# Patient Record
Sex: Male | Born: 1965 | Race: White | Hispanic: No | Marital: Married | Smoking: Former smoker
Health system: Southern US, Academic
[De-identification: ages and names within clinical notes are randomized; demographics above are authoritative.]

## PROBLEM LIST (undated history)

## (undated) DIAGNOSIS — J45909 Unspecified asthma, uncomplicated: Secondary | ICD-10-CM

## (undated) DIAGNOSIS — I2510 Atherosclerotic heart disease of native coronary artery without angina pectoris: Secondary | ICD-10-CM

## (undated) DIAGNOSIS — I10 Essential (primary) hypertension: Secondary | ICD-10-CM

## (undated) DIAGNOSIS — G4730 Sleep apnea, unspecified: Secondary | ICD-10-CM

## (undated) DIAGNOSIS — K219 Gastro-esophageal reflux disease without esophagitis: Secondary | ICD-10-CM

## (undated) DIAGNOSIS — I219 Acute myocardial infarction, unspecified: Secondary | ICD-10-CM

## (undated) DIAGNOSIS — S8990XA Unspecified injury of unspecified lower leg, initial encounter: Secondary | ICD-10-CM

## (undated) DIAGNOSIS — Z951 Presence of aortocoronary bypass graft: Secondary | ICD-10-CM

## (undated) DIAGNOSIS — E785 Hyperlipidemia, unspecified: Secondary | ICD-10-CM

## (undated) DIAGNOSIS — Z9989 Dependence on other enabling machines and devices: Secondary | ICD-10-CM

## (undated) DIAGNOSIS — E7800 Pure hypercholesterolemia, unspecified: Secondary | ICD-10-CM

## (undated) HISTORY — PX: HX TONSIL AND ADENOIDECTOMY: SHX28

## (undated) HISTORY — PX: HX TONSILLECTOMY: SHX27

## (undated) HISTORY — PX: HX CORONARY ARTERY BYPASS GRAFT: SHX141

## (undated) HISTORY — PX: HX DENTAL EXTRACTION: 2100001168

## (undated) HISTORY — PX: HX ADENOIDECTOMY: SHX29

## (undated) HISTORY — PX: CORONARY ARTERY ANGIOPLASTY: PR CATH30428

## (undated) HISTORY — DX: Unspecified injury of unspecified lower leg, initial encounter: S89.90XA

## (undated) HISTORY — PX: CORONARY ARTERY BYPASS GRAFT: SHX141

## (undated) HISTORY — PX: CORONARY STENT INTERVENTION: CATH118234

## (undated) HISTORY — PX: APPENDECTOMY: SHX54

## (undated) HISTORY — DX: Sleep apnea, unspecified: G47.30

---

## 2006-05-15 ENCOUNTER — Emergency Department (HOSPITAL_COMMUNITY): Payer: Self-pay

## 2007-07-12 ENCOUNTER — Other Ambulatory Visit (INDEPENDENT_AMBULATORY_CARE_PROVIDER_SITE_OTHER): Payer: Self-pay | Admitting: ORTHOPEDIC, SPORTS MEDICINE

## 2007-07-12 ENCOUNTER — Ambulatory Visit (INDEPENDENT_AMBULATORY_CARE_PROVIDER_SITE_OTHER): Payer: Worker's Compensation

## 2007-07-12 ENCOUNTER — Ambulatory Visit (INDEPENDENT_AMBULATORY_CARE_PROVIDER_SITE_OTHER): Payer: Worker's Compensation | Admitting: ORTHOPEDIC, SPORTS MEDICINE
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
## 2007-08-14 ENCOUNTER — Encounter (INDEPENDENT_AMBULATORY_CARE_PROVIDER_SITE_OTHER): Payer: Worker's Comp, Other unspecified | Admitting: ORTHOPEDIC, SPORTS MEDICINE

## 2007-08-16 NOTE — Progress Notes (Signed)
[facility] Department of Orthopaedics  [address]  [place], [place] [address]      SPORTS MEDICINE PROGRESS NOTE    PATIENT NAME: [name] [name], [name] [name]  CHART NUMBER: [id number]  DATE OF BIRTH:   DATE OF SERVICE: 08/14/2007    DATE OF INJURY: 07/02/2007  CLAIM NUMBER: [id number]    SUBJECTIVE: Mr. Gupton presents for followup of his right knee and review of his MRI. He brings a copy of his MRI today on a CD. I have personally reviewed this film. He reports that after I had seen him, he was initially doing light duties. He felt as though his knee was improving; however, he then was cleared to return to full duties, apparently by another physician, and he reports a few episodes in which he had been kneeling down on one knee and when he stood up, he had a locking sensation in his knee and was unable to straighten or bend his knee and had severe pain. This lasted for about a day and a half and then he was able to slowly work this out. He also reports a similar incident this past weekend, which occurred while he was home on Saturday. Apparently he was at the point that he could not work yesterday because of inability to straighten his knee. He notes a catching sensation come across the lateral aspect of the knee when this happens. He feels that the pain over the front of the knee has subsided somewhat. He denies any patellar instability or further patellar dislocations after the initial event.     OBJECTIVE: He is in no acute distress. He has a minimally antalgic gait. Right knee: There is a trace effusion. He is nontender over the medial border of the patella. There is no significant patellar apprehension. He maintains full flexion of the knee. He is point tender over the posterior medial joint line. McMurray maneuver isolates pain to the posterior medial joint line and on one occasion throughout the exam, there is a palpable click noted over the posterior horn of the medial meniscus. I also did a brief ultrasound examination of the medial meniscus, which did not demonstrate any subluxation of the meniscus or any obvious tears in the peripheral portion of the meniscus.     RADIOGRAPHS: MRI dated 2/17 is reviewed by myself. This demonstrates a sprain or partial tearing of the medial patellofemoral ligament. There are no obvious tears of the medial meniscus. There are no injuries noted of the cruciate ligaments. There are no significant bony injuries.    ASSESSMENT: Right patellar dislocation and unspecified internal derangement of the right knee.    PLAN: I would recommend returning to limited duties with no lifting greater than 20 pounds, no kneeling and squatting or climbing activities for the next three weeks. I also feel it is of utmost importance for him to return to physical therapy during this time to prevent reinjury and to promote further and complete healing. I would like to see him back in three weeks to see how he has done returning to some of the restrictions at work, as well as returning to physical therapy.      Beola Cord, MD  Assistant Professor  [facility] Department of Orthopaedics    [id number]; D: 08/14/2007 12:41:13; T: 08/16/2007 01:22:42    cc: [employer]    [address]    [place], [place] [address]

## 2010-07-19 ENCOUNTER — Observation Stay (HOSPITAL_COMMUNITY): Admission: EM | Admit: 2010-07-19 | Disposition: A | Discharge status: Home / Self Care | Payer: BC Managed Care – PPO

## 2010-07-19 ENCOUNTER — Emergency Department (EMERGENCY_DEPARTMENT_HOSPITAL): Payer: BC Managed Care – PPO

## 2010-09-27 ENCOUNTER — Emergency Department (EMERGENCY_DEPARTMENT_HOSPITAL): Payer: BC Managed Care – PPO

## 2010-09-27 ENCOUNTER — Emergency Department (EMERGENCY_DEPARTMENT_HOSPITAL): Admission: EM | Admit: 2010-09-27 | Discharge: 2010-09-27 | Payer: BC Managed Care – PPO

## 2011-06-15 HISTORY — PX: HX STENTING (ANY): 2100001347

## 2011-10-07 ENCOUNTER — Emergency Department (EMERGENCY_DEPARTMENT_HOSPITAL): Admission: EM | Admit: 2011-10-07 | Discharge: 2011-10-07 | Payer: Self-pay

## 2011-10-07 ENCOUNTER — Emergency Department (EMERGENCY_DEPARTMENT_HOSPITAL): Payer: Self-pay

## 2012-04-09 ENCOUNTER — Emergency Department (EMERGENCY_DEPARTMENT_HOSPITAL)
Admission: EM | Admit: 2012-04-09 | Discharge: 2012-04-09 | Payer: 59 | Attending: Emergency Medicine | Admitting: Emergency Medicine

## 2012-04-09 ENCOUNTER — Emergency Department (EMERGENCY_DEPARTMENT_HOSPITAL): Payer: 59

## 2013-01-24 ENCOUNTER — Ambulatory Visit (HOSPITAL_BASED_OUTPATIENT_CLINIC_OR_DEPARTMENT_OTHER): Payer: 59 | Admitting: Internal Medicine

## 2013-01-24 VITALS — BP 140/80 | HR 76 | Resp 18 | Ht 75.0 in | Wt 284.0 lb

## 2013-01-24 NOTE — H&P (Signed)
 [facility]  [address] [place], [place] [address]    HISTORY AND PHYSICAL    PATIENT NAME: [name] [name]  CHART NUMBER: [id number]  DATE OF BIRTH: 1965-12-26  DATE OF SERVICE: 01/24/2013    [facility] Office  [address]  [place], [place] [address]  Fax Number [phone]    PATIENT IDENTIFICATION, CHIEF COMPLAINT, HISTORY OF PRESENT ILLNESS:  [name] is a 47 year old man with history of coronary artery disease status post PCI to the LAD first being in December 2011 and last being August 2012, where he had a 3.0 x 38 mm ION stent placed in the LAD and also a 3.0 x 8 mm ION stent in the LAD.  His only other past medical history is asthma and hypertension.  He is here today for scheduled followup.  He has no return of his angina symptoms, no angina on exertion, no dyspnea and no dyspnea on exertion.  He states he has had recent stress testing, which was normal.  He has previously followed at [facility] by Dr. Dick Sharps.  He has no other complaints today.    PAST MEDICAL HISTORY:  Coronary artery disease as above, status post PCI in August 2012 with a 3.0 x 8 mm ION stent, a 3.0 x 38 mm ION stent in LAD, in December 2011, he also had another stent for a total of 3 stents in the LAD, asthma, hypertension.    PAST SURGICAL HISTORY:  Cardiac catheterization last being August 2012.    ALLERGIES:  PHENERGAN.    MEDICATIONS:  1.  Lisinopril 5 mg daily.  2.  Plavix  75 mg daily.  3.  Metoprolol  12.5 mg b.i.d.  4.  Lipitor 20 mg daily.  5.  Aspirin  325 mg daily.  6.  Nitroglycerin  p.r.n.    SOCIAL HISTORY:  Nonsmoker, nondrinker.    FAMILY HISTORY:  Positive for premature family history of coronary artery disease in the mother and father.    REVIEW OF SYSTEMS:  As per history of present illness.  All other review of systems is negative.  Cardiac review of systems negative for chest pain.    PHYSICAL EXAMINATION:  Vital signs:  Blood pressure is 140/80, pulse 76, blood pressure  140/80, respirations 18, weight is 284 pounds, height 6 feet 3 inches.  General:  No acute distress, pleasant man.  Head and neck:  No jugular venous distention, normocephalic, atraumatic.  Extraocular muscles intact.  Cardiac:  Regular rate and rhythm, normal S1 and S2 with no significant murmurs.  Lungs:  Clear to auscultation bilaterally, no wheezes, rales or rhonchi.  Abdomen:  Soft, nontender, normal abdominal bowel sounds.  Extremities:  Revealed no edema bilateral lower extremities.  Neurologic:  Cranial nerves 2-12 grossly intact.  No focal deficits.    LABS AND TESTS:  July 2014, TSH 1.23, total cholesterol 133, triglycerides 139, HDL 37, LDL 68, potassium 4.8, BUN 17, creatinine 1.0, AST 22, ALT 31, hemoglobin 16.98, hematocrit 49, platelet count 259.    ASSESSMENT AND PLAN:  1.  Coronary artery disease status post PCI to the LAD last being August 2012.  2.  Asymptomatic from a cardiac standpoint.  3.  Hypertension.    RECOMMENDATIONS:  1.  At this time, the patient is stable.  He is having no symptoms.  His lipids are at goal.  He is to continue medical therapy as listed above.  2.  The patient to take aspirin  81  mg daily.  He does not need the full dose of aspirin .  He will follow up me in 6 months.  He is stable from a cardiac standpoint, no further testing as he has had recent lipids, his lipids were at goal, and labs are satisfactory.      Curtistine Bos, MD  Assistant Professor  [facility] Department of Cardiology    [id number]; D: 01/24/2013 12:36:01; T: 01/24/2013 86:71:72    cc: Cathryne Neas PA-C

## 2013-01-25 ENCOUNTER — Encounter (HOSPITAL_BASED_OUTPATIENT_CLINIC_OR_DEPARTMENT_OTHER): Payer: Self-pay | Admitting: Internal Medicine

## 2013-03-07 ENCOUNTER — Ambulatory Visit (INDEPENDENT_AMBULATORY_CARE_PROVIDER_SITE_OTHER): Payer: 59 | Admitting: Orthopaedic Surgery

## 2013-03-07 ENCOUNTER — Encounter (INDEPENDENT_AMBULATORY_CARE_PROVIDER_SITE_OTHER): Payer: Self-pay | Admitting: Orthopaedic Surgery

## 2013-03-07 VITALS — BP 126/72 | Ht 75.0 in | Wt 284.0 lb

## 2013-03-08 NOTE — H&P (Addendum)
[facility]opaed[facility]  [address], [address]  [place], [place] [address]        OFFICE VISIT    PATIENT NAME:      [name] [name], [name] [name]  VISIT IDENTIFICATION    [id number]  MEDICAL RECORD NUMBER [id number]    DICTATING PHYSICIAN: Bertram Millard. [place], [place]   REFERRING PHYSICIAN:     Randol Kern, PA-C    DOB:     DOS: 03/07/2013    cc:  Randol Kern, PA-C    HISTORY OF PRESENT ILLNESS:  Fadil is a very pleasant 47 year old male that presents to the office today  with complaints of left knee pain. The patient states that on February 24, 2013, he was doing work on a [employer] stating that he was sitting on  his legs while they were bent and he was kneeling. He states that as he began  to rise from this position, he twisted his left knee, felt a pop, that he  describes as a tearing of a band within the lateral aspect of his left knee.  He states that he began to immediately experience left knee pain. He was  unable to completely extend his left knee. He states that he had to have his  son pull on his leg in order to straighten this. He states that it then began  to buckle when he began to place weight on it, and it began to swell severely.  He states that he began to Ace wrap this and ice it. He presented to  [facility] where x-rays were performed and did not reveal any sign of acute  fractures, osseous lesions or bony abnormalities. He was advised to compress  and ice, and take Tylenol as needed for his pain. He states that he is unable  to take nonsteroidal anti-inflammatory drugs (NSAIDs) secondary to his  coronary artery stents and blood thinners. He states that he has been  experiencing constant left knee pain since this time that he describes as dull  and burning primarily in the lateral aspect of the left knee, worse with  movement, use and resting for too long; better with elevation, icing and  bracing. He rates the pain today as an 8 out of 10. He does admit to catching   and locking. He denies any clicking in the left knee. He admits to buckling  and instability but denies any patellar dislocation. He, otherwise, today  denies any pins and needle sensations, paralysis, paresthesias, numbness,  change in color or pallor of the left lower extremity. He denies any fevers,  chills, night sweats, nausea, vomiting, abdominal pain, chest pain, shortness  of breath or dyspnea.     REVIEW OF SYSTEMS:  Review of systems as per history of present illness (HPI); otherwise, review  of systems is negative.     PAST MEDICAL HISTORY:   Significant for coronary artery disease.     PAST SURGICAL HISTORY:  Significant for coronary artery stenting, tonsillectomy, adenoidectomy, and  dental extraction.     FAMILY HISTORY:  None.     SOCIAL HISTORY:  The patient denies any tobacco, alcohol or intravenous (IV) drug abuse.     MEDICATIONS:  ProAir inhaled p.o. p.r.n. shortness of breath, Tylenol 500 milligrams p.o.  p.r.n. pain, aspirin 325 milligrams p.o. daily, Lipitor 20 milligrams p.o.  daily, Plavix 75 milligrams p.o. daily, Prinivil 5 milligrams daily, Lopressor  25 milligrams p.o. daily, and Nitrostat 0.4 milligrams sublingual p.o. p.r.n.  chest pain.  ALLERGIES:  PHENERGAN.     PHYSICAL EXAMINATION:  GENERAL:  Reveals a very pleasant 47 year old male in no apparent distress.  Awake, alert, and oriented x3. Well-nourished, well-developed, and appearing  his stated age.   VITAL SIGNS:  Blood pressure: 126/72. WEIGHT: 284 pounds. HEIGHT:  6 feet 3 inches. Body mass index (BMI) is 35.50.   HEENT:  Normal. Extraocular movements are intact.   LUNGS:  Respirations are clear. Symmetrical rise and fall of the chest wall  with inspiration and expiration.   ABDOMEN:  Soft, nondistended.   MUSCULOSKELETAL:  Examination today of the left knee reveals minimal left knee  joint effusion. No significant left-sided quadriceps atrophy. Calf is soft and   supple without pain to palpation. Extensor hallucis longus (EHL), flexor  hallucis longus (FHL) and Achilles tendons are intact. He does have full  extension. Range of motion is 0 to 115 degrees extension. Strength is rated as  4/5 on the left. Flexion is rated as 4+/5. Varus and valgus stresses are  stable. Varus stress does reproduce pain. There is pain to palpation over the  lateral collateral ligament (LCL). There is lateral joint line tenderness. No  medial joint line tenderness. Patellar tendon and quadriceps tendons are  intact. Lachman's, anterior drawer and posterior drawer are stable. McMurray's  today is positive in the lateral compartment.   NEUROLOGICAL:  Cranial nerves II through XII are grossly intact. No sign of  any focal motor deficits. He remains sensate to light touch in all distal  nerve distributions of the left lower extremity.   CARDIOVASCULAR:  Dorsalis pedis pulse is palpated as 2+ today on the left. No  significant left-sided lower extremity edema. Brisk capillary refill in all  distal phalanges of the left lower extremity.   INTEGUMENTARY:  Intact. Reveals no sign of any ecchymosis, abrasions  excoriations, jaundice, rashes, or other visible lesions.   PSYCHIATRIC:  Mood and affect are appropriate.     IMAGING DATA:  4 views of the left knee today reveals no sign of acute fractures, osseous  lesions, bony abnormalities. No significant degenerative joint disease is  appreciated.     ASSESSMENT:  1. Lateral meniscus tear of the left knee.   2. Lateral collateral ligament (LCL) strain.     PLAN:  My plan for Mr. Lejeune at this time will be to order an magnetic resonance  imaging (MRI) scan of his left knee to further rule out lateral meniscus tear.  He will continue to wear his brace, ice, and take over the counter drugs  (NSAIDs) such as Tylenol for his pain. He will continue to wear his brace. He  would like to return back to work tonight, full duty. 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 I feel this is acceptable since he does drive and it is  an automatic. I will see him back once his magnetic resonance imaging (MRI)  scan has been completed, sooner if he develops any further pains, problems or  concerns. After discussing this plan with the patient today, he is very  understanding and agreeable. All of his questions were answered to his  satisfaction today in the office.                                   Bertram Millard. Opal Sidles, PA-C  Duard Larsen, MD      d:  03/07/2013 13:12:13  t:  03/08/2013 09:04:43  ag  doc#: [id number]  voice#:  [id number]  <START FOOTER> Page 2 of 3  <end footer>

## 2013-04-16 ENCOUNTER — Encounter (INDEPENDENT_AMBULATORY_CARE_PROVIDER_SITE_OTHER): Payer: Self-pay | Admitting: Orthopaedic Surgery

## 2013-04-19 ENCOUNTER — Ambulatory Visit (INDEPENDENT_AMBULATORY_CARE_PROVIDER_SITE_OTHER): Payer: 59 | Admitting: Orthopaedic Surgery

## 2013-04-19 ENCOUNTER — Encounter (INDEPENDENT_AMBULATORY_CARE_PROVIDER_SITE_OTHER): Payer: Self-pay | Admitting: Orthopaedic Surgery

## 2013-04-19 VITALS — BP 114/76 | Ht 75.0 in | Wt 284.0 lb

## 2013-04-20 ENCOUNTER — Encounter (INDEPENDENT_AMBULATORY_CARE_PROVIDER_SITE_OTHER): Payer: Self-pay | Admitting: Orthopaedic Surgery

## 2013-04-23 NOTE — Progress Notes (Addendum)
[facility]  [address]  [place], [place] [address]        FOLLOW-UP VISIT    PATIENT NAME:        [name] [name], [name] [name]  VISIT IDENTIFICATION    [id number]  MEDICAL RECORD NUMBER [id number]    DICTATING PHYSICIAN: Bertram Millard. [place], [place]   REFERRING PHYSICIAN:     Randol Kern, PA-C    DOB:     DOS: 04/19/2013    cc:  Randol Kern, PA-C    HISTORY OF PRESENT ILLNESS:  [name] [name] is a very pleasant 47 year old male who presents to the office today  for follow-up of a left knee magnetic resonance imaging (MRI) scan. During the  patient's last office visit, he was diagnosed with a left knee lateral  meniscal tear. Since the time that I last saw him in September, he states that  his pain has been relatively the same. He states that he is still experiencing  catching, clicking and locking within the left knee. He also admits to  instability and buckling of his left knee with ambulating up and down stairs.  He admits to pain with deep squats, as well as twisting of the left knee. He  admits to intermittent effusions. He otherwise today denies any further pins  and needles sensations, paresthesias, numbness, change in color or pallor of  the left lower extremity. He denies any other neurological, cardiovascular or  further orthopedic pains, problems or concerns.     REVIEW OF SYSTEMS:  Review of systems as per history of present illness (HPI); otherwise, review  of systems are negative.     PAST MEDICAL HISTORY, PAST SURGICAL HISTORY, FAMILY HISTORY, SOCIAL HISTORY,  MEDICATIONS AND ALLERGIES:   Were all reviewed with the patient today and remain unchanged from the  patient's prior office visit on February 25, 2013.           PHYSICAL EXAMINATION:  GENERAL:  A very pleasant 47 year old male in no apparent distress. Awake,  alert, and oriented x3. He is well-nourished, well-developed, and appearing  his stated age.   VITAL SIGNS:  Blood pressure: 114/76. Weight: 284 pounds. Height: 6 feet 3   inches. Body mass index (BMI): 35.5.    MUSCULOSKELETAL:  Examination today of the left knee reveals no significant  left knee quadriceps atrophy. No significant left knee joint effusion. The  calf is soft and subtle without any pain to palpation. Extensor hallucis  longus (EHL), flexor hallucis longus (FHL) and Achilles tendons are all  intact. Varus and valgus stresses are stable. Patellar tendon and quadriceps  tendons are intact. Extensor mechanism is intact. No medial or joint line  tenderness. There is lateral joint line tenderness. Range of motion is 0 to  120 degrees. Strength in terms of knee flexion and extension today were rated  as 4+ out of 5 today on the left in comparison to the right. Lachman's,  anterior drawer, posterior drawer are stable. McMurray's today is positive in  the lateral compartment.   NEUROLOGICAL:  Cranial nerves 2 through 12 are grossly intact. No sign of any  focal motor deficits. He remains sensate to light touch in all distal nerve  distributions of the left lower extremity.   CARDIOVASCULAR:  Dorsalis pedis pulse is palpated as 2+ today on the left. No  significant left-sided lower extremity edema is appreciated. Brisk capillary  refill in all distal phalanges of the left lower extremity.   INTEGUMENTARY:  Intact; reveals no sign  of any jaundice, rashes, or other  visible lesions.     IMAGING DATA:  MRI scan of the left knee was reviewed that today the patient, which reveals  in the anterior and posterior horn lateral meniscal tears of the left knee, as  well as an anterior cruciate ligament (ACL) strain. Otherwise, there is  appreciated knee joint effusion. No sign of any osseous lesions or further  bony abnormalities. No sign of acute fractures.     ASSESSMENT:  1. Left knee lateral meniscal tear.   2. Anterior cruciate ligament (ACL) strain.     PLAN:  My plan for [name] [name] at this time will be to have him evaluated by Dr.   Shirley Friar to discuss the possibility of arthroscopic debridement of his lateral  meniscus. The patient has opted to have this fixed if he is a surgical  candidate. He will otherwise continue over-the-counter nonsteroidal  anti-inflammatory drug (NSAID) therapy, heat, ice and elevation, compression,  and rest. I will see him back on an as needed basis. After discussing this  plan with the patient today, he was understanding and agreeable. All of his  questions were answered to his satisfaction today in the office.                                        Bertram Millard. Opal Sidles, [place]                                Duard Larsen, MD    d:  04/20/2013 15:57:51  t:  04/23/2013 14:36:14  cw  doc#:   [id number]  voice#:  [id number]  <START FOOTER> Page 2 of 3  <end footer>

## 2013-04-30 ENCOUNTER — Ambulatory Visit (INDEPENDENT_AMBULATORY_CARE_PROVIDER_SITE_OTHER): Payer: 59 | Admitting: Sports Medicine

## 2013-04-30 ENCOUNTER — Encounter (INDEPENDENT_AMBULATORY_CARE_PROVIDER_SITE_OTHER): Payer: Self-pay | Admitting: Sports Medicine

## 2013-04-30 VITALS — BP 106/74 | Ht 75.0 in | Wt 275.0 lb

## 2013-04-30 LAB — BASIC METABOLIC PANEL
BUN: 17 mg/dL (ref 8–20)
CALCIUM: 9.3 mg/dL (ref 8.9–10.3)
CARBON DIOXIDE: 29 mEq/L (ref 22–32)
CHLORIDE: 101 mEq/L (ref 101–111)
CREATININE: 1.1 mg/dL (ref 0.6–1.2)
ESTIMATED GLOMERULAR FILTRATION RATE: >60 — AB
GLUCOSE,NONFAST: 108 mg/dL (ref 70–110)
POTASSIUM: 4.4 mEq/L (ref 3.6–5.1)
SODIUM: 136 mEq/L (ref 136–144)

## 2013-05-09 NOTE — Progress Notes (Signed)
 [facility]  [address]  [address]  [place], [place] [address]  [phone]      FOLLOW-UP VISIT    PATIENT NAME:        [name] [name], [name] [name] IDENTIFICATION    [id number]  MEDICAL RECORD NUMBER [id number]    DICTATING PHYSICIAN: Fairy Goo, MD   REFERRING PHYSICIAN:     Elenor Neas, PA-C    DOB:   [birth year]  DOS: 04/30/2013    cc:  Elenor Neas, PA-C      CHIEF COMPLAINT:  Left knee pain.     HISTORY OF PRESENT ILLNESS:  [name] [name] is a pleasant 47 year old male who had a twisting injury to his left knee  while fixing his lawn mower. He presented to my physician assistant (PA) with  lateral left knee pain. Magnetic resonance imaging (MRI) was performed. It  showed a lateral meniscus tear. He was sent to me for definitive care. He  isolated pain in the lateral aspect of the knee, a lot of popping, catching  and clicking and on and off effusions. He denies any significant instability.  He never had issues with this knee prior.     Past medical history, surgical history, medications, allergies, social  history, family history, and review of systems were discussed and reviewed  with the patient, unchanged from the visit on March 07, 2013.     PHYSICAL EXAMINATION:  VITAL SIGNS:  Blood pressures: 106/74. Height: 275 pounds. Height: 6 feet 3  inches. Body mass index (BMI): 34.37.   GENERAL:  He is alert, oriented answers questions appropriately. He is  pleasant. He walks with a normal gait and has a normal affect.   HEENT:  Normal.   LUNGS:  Breathing is clear and unlabored.   ABDOMEN:  Soft.   EXTREMITIES:  Physical examination the left knee reveals range of motion just  shy of full extension. Comfortably can flex to 135 degrees. Pain with  hyperflexion and hyperextension. Lateral joint line tenderness to palpation,  lateral pain with McMurray's. No medial pain. Anterior cruciate ligament  (ACL), posterior cruciate ligament (PCL), lateral collateral ligament (LCL)  are intact. Quadriceps (quad) has good tone.  Extensor mechanism is strong and  intact. No patellofemoral signs. Calf is soft and supple. Skin is normal.  Normal motor, sensory and vascular exam distally. Passive range of motion does  not reproduce pain.     IMAGING:   X-rays of the left knee shows no fracture, dislocation, or degenerative  change. MRI was reviewed. It shows is a complex lateral meniscus tear.     IMPRESSION:  Lateral meniscus tear of the left knee.     PLAN:  Left knee arthroscopy and partial lateral meniscectomy. Risk, benefits and  details of that were explained to the patient. He agreed to proceed and gave  informed written consent.                                  Fairy Goo, MD                                        d:  05/07/2013 16:25:48  t:  05/09/2013 13:47:36  cw  doc#:  [id number]  voice#:  [id number]  <START FOOTER> Page 2 of 2  <end footer>

## 2013-06-06 ENCOUNTER — Encounter (INDEPENDENT_AMBULATORY_CARE_PROVIDER_SITE_OTHER): Payer: Self-pay | Admitting: Sports Medicine

## 2013-06-06 ENCOUNTER — Ambulatory Visit (INDEPENDENT_AMBULATORY_CARE_PROVIDER_SITE_OTHER): Payer: 59 | Admitting: Sports Medicine

## 2013-06-06 VITALS — BP 110/70 | Wt 275.0 lb

## 2013-06-10 NOTE — Progress Notes (Signed)
[facility]  [address]  [address]  [place], [place] [address]  [phone]      FOLLOW-UP VISIT    PATIENT NAME:        [name] [name], [name] [name]  VISIT IDENTIFICATION  [id number]  MEDICAL RECORD NUMBER [id number]    DICTATING PHYSICIAN: Duard Larsen, MD   REFERRING PHYSICIAN:     Randol Kern, PA-C    DOB:     DOS: 06/06/2013    cc:  Randol Kern, PA-C    Jay is 2 weeks status post left knee arthroscopy and partial lateral  meniscectomy. He has done fantastic. He has progressed nicely with physical  therapy. He says the knee feels significantly better. He is walking with a  cane but otherwise denies any fever, chills, or sweats. Denies wound  complications. No calf pain or swelling.    Past medical history, past surgical history, medications, allergies, social  history, family history, and review of systems were discussed and reviewed  with the patient and unchanged from the visit on January 24, 2013.     PHYSICAL EXAMINATION:  VITAL SIGNS:  Blood pressure 110/70.  Weight:  275 pounds.  Body Mass Index  (BMI) is 34.37.   GENERAL:  He is alert, oriented answers questions appropriately. He is  pleasant. Walks with a slight limp with a cane. He has a normal affect.   HEENT:  Normal.   LUNGS:  Breathing is clear and unlabored.   ABDOMEN:  Soft.   EXTREMITIES:  Physical examination of the left knee. Stitches were removed  today. Portals look great and appropriate, mild to moderate effusion, not warm  or red. His range of motion is just shy of full extension. Comfortably can  flex to 125 degrees today. Normal motor, sensory and vascular exam distally.         IMPRESSION:  Progressing nicely 2 weeks status post left knee arthroscopy, partial lateral  meniscectomy.     PLAN:  At this point is to continue working on getting last bit of range of motion  and last bit of strength and effusion control and I would like to see him back  in 4 weeks. He is to continue with physical therapy.        Duard Larsen, MD                                        d:  06/06/2013 17:13:15  t:  06/10/2013 14:55:26  jr  doc#:  [id number]  voice#:  [id number]  <START FOOTER> Page 2 of 2  <end footer>

## 2013-06-27 ENCOUNTER — Other Ambulatory Visit (HOSPITAL_BASED_OUTPATIENT_CLINIC_OR_DEPARTMENT_OTHER): Payer: Self-pay | Admitting: Internal Medicine

## 2013-06-27 DIAGNOSIS — Z024 Encounter for examination for driving license: Secondary | ICD-10-CM

## 2013-06-27 DIAGNOSIS — I2510 Atherosclerotic heart disease of native coronary artery without angina pectoris: Secondary | ICD-10-CM

## 2013-06-27 NOTE — Progress Notes (Signed)
Horton Finerodd A Resendes MRN: [id number] Home: [phone]    PCP: Donny PiqueLeach, Carrie, PA-C Work: [phone]   48 y.o. / Male () Wt: 124.739 kg (275 lb) (06/06/2013) Mobile: [phone]               Documentation      Summary: stress test      >> Awilda BillKARA L DELBERT, CS 06/25/2013 09:31 AM  Patient of Dr. 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 Please call Bjorn LoserRhonda back at - [phone]. Tawanna Coolerodd is returning to work 1/12. Thank you.                Call History         Type Contact Phone User    06/25/2013 9:27 AM Phone (Incoming) Prusinski,RHONDA (Emergency Contact) [phone] Rexene Edison(H) Awilda Billelbert, Kara L, CS        From: Florestine AversWolf, Mindy Behnken S, RN Sent: 06/25/2013 9:46 AM To: Jodi GeraldsAnthony Roda-Renzelli, MD Subject: Annell GreeningFW: stress test Ethelene BrownsAnthony, If it is all right with you, I will order this for this pt. Please advise. Thanks, Clydie BraunKaren     RE: stress test      Jodi Geraldsoda-Renzelli, Anthony, MD            Sent: Thu June 27, 2013 8:50 AM     To: Florestine AversWolf, Kasi Lasky S, RN                              Message       Okay to order               Thank you          Ethelene BrownsAnthony

## 2013-07-01 ENCOUNTER — Ambulatory Visit (INDEPENDENT_AMBULATORY_CARE_PROVIDER_SITE_OTHER): Payer: 59 | Admitting: Orthopaedic Surgery

## 2013-07-01 ENCOUNTER — Encounter (INDEPENDENT_AMBULATORY_CARE_PROVIDER_SITE_OTHER): Payer: Self-pay | Admitting: Orthopaedic Surgery

## 2013-07-01 ENCOUNTER — Encounter (HOSPITAL_BASED_OUTPATIENT_CLINIC_OR_DEPARTMENT_OTHER): Payer: Self-pay

## 2013-07-01 VITALS — BP 124/82 | Ht 75.0 in | Wt 275.0 lb

## 2013-07-01 DIAGNOSIS — S83289A Other tear of lateral meniscus, current injury, unspecified knee, initial encounter: Principal | ICD-10-CM

## 2013-07-01 NOTE — Progress Notes (Addendum)
[facility]  [address]  [place], [place] [address]        FOLLOW-UP VISIT    PATIENT NAME:        [name] [name], [name] [name]  VISIT IDENTIFICATION  [id number]  MEDICAL RECORD NUMBER [id number]    DICTATING PHYSICIAN: Bertram Millard. [place], [place]   REFERRING PHYSICIAN:     Randol Kern, PA-C    DOB:     DOS: 07/01/2013    cc:  Randol Kern, PA-C      HISTORY OF PRESENT ILLNESS:  Mr. Ozburn is a very pleasant 48 year old male that presented to the office  today approximately 4 weeks status post left knee arthroscopy with partial  lateral meniscectomy. Over the past 4 weeks, the patient has been progressing  very nicely. He states that he has no longer been experiencing any significant  pain or discomfort within the left knee. His incisions have remained well  healed. He is no longer taking narcotic pain medication for discomfort. He  actually states today that he is not taking any medication. He has completed 4  weeks of complete physical therapy. He states that his knee feels as it did  prior to his injury. He denies any further trauma or injury. He is walking  without any form of assistance. He states today that he is ready to return  back to work full duty without any form of restrictions. Otherwise today, he  denies any catching, clicking or locking within the left knee. He denies any  buckling or instability. He does admit to intermittent popping, but otherwise  today denies any fevers, chills, night sweats, erythema, redness, discharge  drainage from the incision sites. He denies any calf pain or further edema.     REVIEW OF SYSTEMS:  Review of systems as per history of present illness (HPI); otherwise, review  of systems are negative.     Past medical history, past surgical history, family history, social history,  medications and allergies were all reviewed with the patient today and remain  unchanged from the patient's prior office visit on March 07, 2013.     PHYSICAL EXAMINATION:  GENERAL:  A  very pleasant 48 year old male in no apparent distress. Awake,  alert, and oriented x3, well-nourished, well-developed, and appearing his  stated age.   VITAL SIGNS:  Blood pressure: 124/82. Weight: 275 pounds. Height: 6 feet 3  inches. Body mass index (BMI) is 35.37.   MUSCULOSKELETAL:  Examination today of the left knee reveals no significant  left-sided quadriceps atrophy. No significant left knee joint effusion. The  calf is soft and supple without pain to palpation. Extensor hallucis longus  (EHL), flexor hallucis longus (FHL) and Achilles tendons are intact. Varus and  valgus stresses are stable. Patellar tendon and quadriceps tendons are intact.  No medial or lateral joint line tenderness. Minimal edema surrounding the  incision sites. Incisions appear well-healed. No sign of erythema, redness,  discharge or drainage. Range of motion is 0 to 130 degrees. Strength in terms  of knee flexion and extension today was rated as 5 out of 5.   NEUROLOGICAL:  Cranial nerves 2 through 12 are grossly intact. No sign of any  focal motor deficits. He remains sensate to light touch in all distal nerve  distributions of the left lower extremity.   CARDIOVASCULAR:  Dorsalis pedis pulse is 2+. Brisk capillary refill. No  significant left-sided lower extremity edema is appreciated.   INTEGUMENTARY:  Intact. Reveals 2 well-healed inferior patellar  portal  incisions without signs of erythema, redness, discharge or drainage.     ASSESSMENT:  A 48 year old male 4 weeks status post left knee arthroscopy with partial  lateral meniscectomy.     PLAN:  My plan for Mr. Cellucci at this time will be for him to discontinue physical  therapy. I will return him back to work full duty without any form of  restrictions. He will follow up with me in 4 weeks for repeat clinical and  radiographic examination to see how he has been doing with his work status. I  did advise him that he may continue to rest, ice, compress, elevate and  take  over-the-counter nonsteroidal anti-inflammatory drugs (NSAIDs) as needed. I  will see him back in 4 weeks, sooner if he develops any further pains,  problems or concerns. After discussing this plan with the patient today, he  was very understanding and agreeable. All of his questions were answered to  his satisfaction.                                         Bertram MillardStephen M. Opal SidlesSweeney, [place]-C                                Duard LarsenJoseph Fazalare, MD        d:  07/01/2013 09:43:18  t:  07/01/2013 13:25:16  cw  doc#:   [id number]  voice#:  [id number]  <START FOOTER> Page 2 of 2  <end footer>

## 2013-07-04 ENCOUNTER — Encounter (INDEPENDENT_AMBULATORY_CARE_PROVIDER_SITE_OTHER): Payer: Self-pay | Admitting: Sports Medicine

## 2013-08-01 ENCOUNTER — Encounter (INDEPENDENT_AMBULATORY_CARE_PROVIDER_SITE_OTHER): Payer: Self-pay | Admitting: Orthopaedic Surgery

## 2013-08-08 ENCOUNTER — Encounter (HOSPITAL_BASED_OUTPATIENT_CLINIC_OR_DEPARTMENT_OTHER): Payer: 59 | Admitting: Internal Medicine

## 2013-10-03 ENCOUNTER — Ambulatory Visit (HOSPITAL_BASED_OUTPATIENT_CLINIC_OR_DEPARTMENT_OTHER): Payer: 59 | Admitting: Internal Medicine

## 2013-10-03 VITALS — BP 130/84 | HR 80 | Resp 20 | Ht 75.0 in | Wt 270.0 lb

## 2013-10-03 DIAGNOSIS — I10 Essential (primary) hypertension: Secondary | ICD-10-CM

## 2013-10-03 DIAGNOSIS — I2510 Atherosclerotic heart disease of native coronary artery without angina pectoris: Secondary | ICD-10-CM

## 2013-10-03 NOTE — Progress Notes (Signed)
[facility]  [address]  [place], [place] [id number]    PROGRESS NOTE    PATIENT NAME: [name] [name], [name]  CHART NUMBER: [id number]  DATE OF BIRTH:   DATE OF SERVICE: 10/03/2013    [facility] [place] [facility]  [address]  [place], [place] [address]  Fax Number [phone]    SUBJECTIVE:  [name] is a 48 year old man with a history of coronary artery disease status post PCI to the LAD in the past, last being August 2012, sounds like he has 3 overlapping stents in his left anterior descending, placed by [facility]. Truman [facility]wardichard Smith [facility].  He is having return of anginal equivalent, left arm pain, left shoulder pain, although he has no chest discomfort.  He is concerned because this is somewhat similar to the symptoms he had when he had a heart attack, although not as severe.  He is also concerned that this could be musculoskeletal, however, it is similar to his previous angina type pain, although he does not have the severity as it was during a heart attack.    MEDICATION LIST:  1.  Lisinopril 5 mg daily.  2.  Plavix 75 mg daily.  3.  Metoprolol 12.5 mg b.i.d.  4.  Lipitor 20 mg daily.  5.  Aspirin 325 mg daily, reduced to 81 mg daily.  6.  Nitroglycerin p.r.n.    OBJECTIVE:  Blood pressure is 130/84, pulse is 80, respirations 20, weight is 270 pounds, height is 6 feet 3 inches.  General:  No acute distress, pleasant man.  Cardiac exam is regular rate and rhythm, normal S1 and S2 with no significant murmurs.  Lungs are clear to auscultation bilaterally, no wheezes, rales or rhonchi.  Abdomen:  Soft, nontender, normal abdominal bowel sounds.  Extremities revealed no edema bilateral lower extremities.    LABS AND TESTS:  July 2014:  TSH 1.23, total cholesterol 133, triglycerides 139, HDL 37, LDL 68, potassium 4.8, BUN 17, creatinine 1.0, AST 22, ALT 31, hemoglobin 16.9, hematocrit 49, platelet count 259.    ASSESSMENT AND PLAN:  1.  Coronary artery disease status post  PCI to the LAD, last being August 2012.  2.  Return of some of his anginal equivalent.  3.  Hypertension.  4.  Dyslipidemia.    RECOMMENDATIONS:  1.  At this time, he is having some return of his anginal symptoms, although they are not completely typical of his previous angina; however, he is concerned.  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  Therefore, we will schedule a pharmacologic myocardial perfusion scan as he had a recent knee surgery done.  2.  Reduced aspirin to 81 mg daily.  Continue all other medications the same.  3.  Needs repeat lipid panel in one year, which would be July 2015.  His lipids were at goal on last assessment.  He will get this done through his primary care provider.  4.  In summary, schedule pharmacologic myocardial perfusion scan.      Jodi GeraldsAnthony Roda-Renzelli, MD  Assistant Professor  [facility] Department of Cardiology    [id number]; D: 10/03/2013 11:16:33; T: 10/03/2013 17:13:02    cc: Randol Kernarrie Leach PA-C      [address] [facility]       [place], [place] [id number]

## 2013-10-10 DIAGNOSIS — M79609 Pain in unspecified limb: Secondary | ICD-10-CM

## 2013-10-10 DIAGNOSIS — I2510 Atherosclerotic heart disease of native coronary artery without angina pectoris: Secondary | ICD-10-CM

## 2013-10-30 ENCOUNTER — Encounter (HOSPITAL_BASED_OUTPATIENT_CLINIC_OR_DEPARTMENT_OTHER): Payer: Self-pay | Admitting: Internal Medicine

## 2014-07-10 ENCOUNTER — Ambulatory Visit (HOSPITAL_BASED_OUTPATIENT_CLINIC_OR_DEPARTMENT_OTHER): Payer: 59 | Admitting: Internal Medicine

## 2014-07-10 VITALS — BP 130/86 | HR 80 | Resp 18 | Ht 75.0 in | Wt 270.0 lb

## 2014-07-10 DIAGNOSIS — Z024 Encounter for examination for driving license: Secondary | ICD-10-CM

## 2014-07-10 DIAGNOSIS — Z021 Encounter for pre-employment examination: Secondary | ICD-10-CM

## 2014-07-10 DIAGNOSIS — R0609 Other forms of dyspnea: Secondary | ICD-10-CM

## 2014-07-10 DIAGNOSIS — I10 Essential (primary) hypertension: Secondary | ICD-10-CM

## 2014-07-10 DIAGNOSIS — I2510 Atherosclerotic heart disease of native coronary artery without angina pectoris: Secondary | ICD-10-CM

## 2014-07-10 NOTE — Progress Notes (Signed)
[facility]  [address] [place]  [place], [place] [id number]    PROGRESS NOTE    PATIENT NAME: [name] FinerLLEN, Demarlo A  CHART NUMBER: [id number]  DATE OF BIRTH:   DATE OF SERVICE: 07/10/2014    [facility] [place] [facility]  [address]  [place], [place] [address]  Fax Number [phone]      SUBJECTIVE:  Mr. Shyrl Numbersodd Santelli is a 49 year old man with a history of coronary artery disease status post PCI to the LAD in the past last seen August 2012.  He has possibly 3 overlapping stents in his LAD placed by Dr. Truman Haywardichard Smith at [facility].  He is doing well.  He has no return of his angina symptoms.  He does have dyspnea on exertion, however.  He also needs a fit for duty stress test due to his work.  All in all getting along well.  He has no angina, but he does have dyspnea on exertion.    MEDICATIONS:  1.  Lisinopril 5 mg daily.  2.  Plavix 75 mg daily.  3.  Metoprolol 12.5 mg b.i.d.  4.  Lipitor 20 mg daily.  5.  Aspirin 81 mg daily.  6.  Nitroglycerin p.r.n.    OBJECTIVE:  Blood pressure is 130/86, pulse is 80, respirations 18, weight is 270 pounds, height 6 feet 3 inches.  General:  No acute distress, pleasant man.  Cardiac exam is regular rate and rhythm, normal S1 and S2 with no significant murmurs.  Lungs are clear to auscultation bilaterally, no wheezes, rales or rhonchi.  Abdomen:  Soft, nontender, normal abdominal bowel sounds.  Extremities revealed no edema bilateral lower extremities.    LABS AND TESTS:  Last labs I have for review were from July 2014, TSH 1.23, total cholesterol 133, triglycerides 139, HDL 37, LDL 68, potassium 4.8, BUN 17, creatinine 1.    ASSESSMENT:  1.  Coronary artery disease status post PCI to the LAD last being August 2012.  2.  Dyspnea on exertion.  3.  Hypertension.  4.  Dyslipidemia.    RECOMMENDATIONS:  1.  The patient needs a fit for duty stress test and also is having some mild dyspnea on exertion.  I will order a myocardial perfusion scan  pharmacologic as he had recent knee surgery done.  2.  Continue above medications the same.  3.  I asked the patient to bring in results of his recent lipid panel to his next appointment, which will be in 6 months.      Jodi GeraldsAnthony Roda-Renzelli, MD  Assistant Professor  [facility] Department of Cardiology    [id number]; D: 07/10/2014 64:33:2912:08:32; T: 07/10/2014 13:29:07    cc: Randol Kernarrie Leach PA-C      [address] [facility]       [place], [place] [id number]

## 2014-10-05 ENCOUNTER — Emergency Department (EMERGENCY_DEPARTMENT_HOSPITAL): Payer: 59

## 2014-10-05 ENCOUNTER — Emergency Department (EMERGENCY_DEPARTMENT_HOSPITAL)
Admission: EM | Admit: 2014-10-05 | Discharge: 2014-10-05 | Payer: 59 | Attending: Emergency Medicine | Admitting: Emergency Medicine

## 2014-10-05 DIAGNOSIS — R109 Unspecified abdominal pain: Secondary | ICD-10-CM

## 2014-10-05 DIAGNOSIS — M545 Low back pain: Secondary | ICD-10-CM

## 2014-10-10 ENCOUNTER — Emergency Department (EMERGENCY_DEPARTMENT_HOSPITAL): Admission: EM | Admit: 2014-10-10 | Discharge: 2014-10-10 | Payer: 59

## 2014-10-10 ENCOUNTER — Emergency Department (EMERGENCY_DEPARTMENT_HOSPITAL): Payer: 59

## 2014-10-10 DIAGNOSIS — R079 Chest pain, unspecified: Secondary | ICD-10-CM

## 2014-10-13 ENCOUNTER — Telehealth (HOSPITAL_BASED_OUTPATIENT_CLINIC_OR_DEPARTMENT_OTHER): Payer: Self-pay | Admitting: Internal Medicine

## 2014-10-13 NOTE — Telephone Encounter (Signed)
Documentation           >> [name] [name], CS 10/13/2014 08:51 AM  The patient's wife is calling because [facility] an [facility] and they put a stent in and he was told to make an appointment with Dr. Basilio Cairooda-Renzelli within a week to have the incision looked at. The patient normally sees [place]. [place]oda[place] but they do not have any openings and was told to call here. Please contact the patient or his wife to discuss/advise.                 Call History         Type Contact Phone User     10/13/2014 08:49 AM Phone (Incoming) [name],[name] (Emergency Contact) [phone] Rexene Edison(H) [name] [name], [name], CS         Spoke with wife who stated that pt is supposed to f/u within two weeks from [facility], but the soonest appointment with Dr. Marylen Pontoenzelli is in June. Explained that we can have [facility] and see the APP staff that works with Dr. Marylen Pontoenzelli. Wife stated that that would be fine. Asked wife to have all records including testing faxed to our office. Gave appointment for 10/28/14 at 8 am. Wife voiced understanding.

## 2014-10-19 ENCOUNTER — Emergency Department (EMERGENCY_DEPARTMENT_HOSPITAL): Payer: 59

## 2014-10-19 ENCOUNTER — Emergency Department (EMERGENCY_DEPARTMENT_HOSPITAL)
Admission: EM | Admit: 2014-10-19 | Discharge: 2014-10-19 | Payer: 59 | Attending: Emergency Medicine | Admitting: Emergency Medicine

## 2014-10-19 DIAGNOSIS — R42 Dizziness and giddiness: Secondary | ICD-10-CM

## 2014-10-19 DIAGNOSIS — R079 Chest pain, unspecified: Secondary | ICD-10-CM

## 2014-10-28 ENCOUNTER — Ambulatory Visit: Payer: 59 | Attending: Internal Medicine

## 2014-10-28 ENCOUNTER — Encounter (HOSPITAL_BASED_OUTPATIENT_CLINIC_OR_DEPARTMENT_OTHER): Payer: Self-pay

## 2014-10-28 VITALS — BP 118/74 | HR 89 | Ht 75.0 in | Wt 278.4 lb

## 2014-10-28 DIAGNOSIS — Z7901 Long term (current) use of anticoagulants: Secondary | ICD-10-CM | POA: Insufficient documentation

## 2014-10-28 DIAGNOSIS — Z955 Presence of coronary angioplasty implant and graft: Secondary | ICD-10-CM | POA: Insufficient documentation

## 2014-10-28 DIAGNOSIS — E785 Hyperlipidemia, unspecified: Secondary | ICD-10-CM | POA: Insufficient documentation

## 2014-10-28 DIAGNOSIS — I10 Essential (primary) hypertension: Secondary | ICD-10-CM | POA: Insufficient documentation

## 2014-10-28 DIAGNOSIS — I2510 Atherosclerotic heart disease of native coronary artery without angina pectoris: Secondary | ICD-10-CM | POA: Insufficient documentation

## 2014-10-28 DIAGNOSIS — J45909 Unspecified asthma, uncomplicated: Secondary | ICD-10-CM | POA: Insufficient documentation

## 2014-10-28 DIAGNOSIS — Z7982 Long term (current) use of aspirin: Secondary | ICD-10-CM | POA: Insufficient documentation

## 2014-10-28 NOTE — Progress Notes (Addendum)
See dictated note.

## 2014-10-29 NOTE — Progress Notes (Addendum)
[facility]                              DEPARTMENT OF MEDICINE                                [place], [place] [id number]                                PATIENT NAME: [name] FinerLLEN, [name] [name]  HOSPITAL QMVHQI:[id number]:[id number]  DATE OF SERVICE:10/28/2014  DATE OF BIRTH:     PROGRESS NOTE    SUBJECTIVE:  [name] [name] is [name] 49 year old gentleman with history of CAD with multiple PCIs in the past including PCI to the LAD on June 14, 2010, with subsequent PCI x2 to the LAD on February 14, 2011, for [name] total of 3 overlapping stents placed [facility], hypertension and hyperlipidemia.  He comes in today for followup after recent angioplasty to the LAD for in-stent restenosis with Dr. Courtney HeysSabbagh on [facility] 10[facility] 2014[facility].  He states on October 10, 2014, that he developed heartburn-like sensation that then radiated down his left arm, as well as neck and shoulder pain.  [facility] as he was driving by it at the time it happened.  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      Since that time, he states that he has been feeling well.  He denies any chest pain, palpitations or swelling.  He states that his shortness of breath is stable and unchanged related to his asthma and his fatigue is about the same as it has been over the last 6 months to [name] year.  He is able to do the activities that he wants without complaint.  He states that he was loading rocks into [name] wheelbarrow the other day and did become slightly fatigued but other than that, he feels quite well.  He walks on [name] regular basis and occasionally rides his bike without complaint.    MEDICATIONS:     Current Outpatient Prescriptions   Medication Sig    ALBUTEROL SULFATE (PROAIR HFA INHL) Take by inhalation    aspirin 325 mg Oral Tablet Take 81 mg by mouth Once  [name] day     atorvastatin (LIPITOR) 20 mg Oral Tablet Take 20 mg by mouth Every evening    cholecalciferol, vitamin D3, 1,000 unit Oral Tablet Take 1,000 Units by mouth Once [name] day    clopidogrel (PLAVIX) 75 mg Oral Tablet Take 75 mg by mouth Once [name] day    fluticasone-vilanterol 100-25 mcg/dose Inhalation Disk with Device Take by inhalation Once [name] day    ipratropium-albuterol 0.5 mg-3 mg(2.5 mg base)/3 mL Solution for Nebulization 3 mL by Nebulization route Four times [name] day    losartan (COZAAR) 50 mg Oral Tablet Take 50 mg by mouth Once [name] day    metoprolol (LOPRESSOR) 25 mg Oral Tablet Take 12.5 mg by mouth Twice daily    montelukast (SINGULAIR) 10 mg Oral Tablet Take 10 mg by mouth Every evening    nitroglycerin (NITROSTAT) 0.4 mg Sublingual Tablet, Sublingual 0.4 mg by Sublingual route Every 5 minutes as needed for Chest pain for 3  doses over 15 minutes    ranitidine (ZANTAC) 150 mg Oral Tablet Take 150 mg by mouth Twice daily         OBJECTIVE:  Blood pressure on exam today is 118/74, pulse is 89, height 6 feet 3 inches, weight 278 pounds.  Oxygen saturation is 95% on room air.  General:  No acute distress, appears his stated age.  Neck is supple and symmetrical, trachea midline.  Lungs:  Clear to auscultation bilaterally.  Cardiovascular:  Regular rate and rhythm, S1 and S2, no murmurs, rubs or gallops.  Extremities:  +2 pulses, no edema.  Skin is warm and dry.  No rashes, no lesions.    ASSESSMENT AND PLAN:  [name] [name] is [name] 49 year old gentleman with history of CAD, hypertension, hyperlipidemia and asthma.  1.  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  The patient is stable and asymptomatic at this time.  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  We have requested [name] hard copy of [facility].  At this time, he will continue on his current regimen to include  aspirin, Plavix, Lipitor, losartan and Lopressor.  2.  Hypertension, well controlled.  He was recently switched from lisinopril to losartan for an ACE cough which has since resolved.  He will continue Losartan as well as his Lopressor.  3.  Hyperlipidemia.  We requested [name] copy of his most recent lipid panel and he will continue on Lipitor 20 mg.  4.  Return to clinic in 6 months or sooner if problems arise or symptoms develop.    This patient was seen independently with supervising physician not present but available for consultation.      Marylin Crosbyhelsea Heilman, FNP-BC  [place] Department of Medicine, Section of Cardiology    Jodi GeraldsAnthony Roda-Renzelli, MD  Assistant Professor  Interventional Cardiology  [facility] Department of Medicine      Agree with the above    Jodi GeraldsAnthony Roda-Renzelli, MD  11/04/2014, 13:42      [id number]; D: 10/28/2014 08:54:55; T: 10/29/2014 05:10:00    cc: Randol Kernarrie Leach PA-C      [address] [facility]       [place], [place] [id number]

## 2015-01-07 ENCOUNTER — Emergency Department (EMERGENCY_DEPARTMENT_HOSPITAL): Payer: 59

## 2015-01-07 ENCOUNTER — Emergency Department (EMERGENCY_DEPARTMENT_HOSPITAL): Admission: EM | Admit: 2015-01-07 | Discharge: 2015-01-07 | Payer: 59

## 2015-01-07 DIAGNOSIS — L02415 Cutaneous abscess of right lower limb: Secondary | ICD-10-CM

## 2015-02-25 ENCOUNTER — Emergency Department (EMERGENCY_DEPARTMENT_HOSPITAL): Admission: EM | Admit: 2015-02-25 | Discharge: 2015-02-25 | Payer: 59

## 2015-02-25 ENCOUNTER — Emergency Department (EMERGENCY_DEPARTMENT_HOSPITAL): Payer: 59

## 2015-02-25 DIAGNOSIS — R079 Chest pain, unspecified: Secondary | ICD-10-CM

## 2015-03-18 ENCOUNTER — Emergency Department (EMERGENCY_DEPARTMENT_HOSPITAL)
Admission: EM | Admit: 2015-03-18 | Discharge: 2015-03-18 | Payer: 59 | Attending: Emergency Medicine | Admitting: Emergency Medicine

## 2015-03-18 ENCOUNTER — Emergency Department (EMERGENCY_DEPARTMENT_HOSPITAL): Payer: 59

## 2015-03-18 DIAGNOSIS — R079 Chest pain, unspecified: Secondary | ICD-10-CM

## 2015-03-20 ENCOUNTER — Other Ambulatory Visit (HOSPITAL_COMMUNITY): Payer: Self-pay

## 2015-03-20 DIAGNOSIS — Z09 Encounter for follow-up examination after completed treatment for conditions other than malignant neoplasm: Secondary | ICD-10-CM

## 2015-03-23 ENCOUNTER — Ambulatory Visit (HOSPITAL_BASED_OUTPATIENT_CLINIC_OR_DEPARTMENT_OTHER): Payer: 59

## 2015-03-23 ENCOUNTER — Ambulatory Visit: Payer: 59 | Attending: Internal Medicine | Admitting: Internal Medicine

## 2015-03-23 DIAGNOSIS — Z09 Encounter for follow-up examination after completed treatment for conditions other than malignant neoplasm: Secondary | ICD-10-CM

## 2015-03-23 DIAGNOSIS — E785 Hyperlipidemia, unspecified: Secondary | ICD-10-CM | POA: Insufficient documentation

## 2015-03-23 DIAGNOSIS — I25110 Atherosclerotic heart disease of native coronary artery with unstable angina pectoris: Secondary | ICD-10-CM | POA: Insufficient documentation

## 2015-03-23 DIAGNOSIS — Z7902 Long term (current) use of antithrombotics/antiplatelets: Secondary | ICD-10-CM | POA: Insufficient documentation

## 2015-03-23 DIAGNOSIS — Z7982 Long term (current) use of aspirin: Secondary | ICD-10-CM | POA: Insufficient documentation

## 2015-03-23 DIAGNOSIS — I10 Essential (primary) hypertension: Secondary | ICD-10-CM | POA: Insufficient documentation

## 2015-03-23 DIAGNOSIS — Z955 Presence of coronary angioplasty implant and graft: Secondary | ICD-10-CM | POA: Insufficient documentation

## 2015-03-23 LAB — BASIC METABOLIC PANEL
ANION GAP: 8 mmol/L (ref 4–13)
BUN/CREA RATIO: 16 (ref 6–22)
BUN: 21 mg/dL (ref 8–25)
CALCIUM: 10.3 mg/dL (ref 8.5–10.4)
CHLORIDE: 100 mmol/L (ref 96–111)
CO2 TOTAL: 28 mmol/L (ref 22–32)
CREATININE: 1.31 mg/dL — ABNORMAL HIGH (ref 0.62–1.27)
ESTIMATED GFR: >59 mL/min/1.73mˆ2 (ref >59)
GLUCOSE: 78 mg/dL (ref 65–139)
POTASSIUM: 4.6 mmol/L (ref 3.5–5.1)
SODIUM: 136 mmol/L (ref 136–145)

## 2015-03-23 LAB — CBC
HCT: 50.4 % — ABNORMAL HIGH (ref 36.7–47.0)
HGB: 17.4 g/dL — ABNORMAL HIGH (ref 12.5–16.3)
MCH: 29.7 pg (ref 27.4–33.0)
MCHC: 34.6 g/dL (ref 32.5–35.8)
MCV: 85.9 fL (ref 78.0–100.0)
MPV: 9 fL (ref 7.5–11.5)
PLATELETS: 230 x10ˆ3/uL (ref 140–450)
RBC: 5.87 x10ˆ6/uL — ABNORMAL HIGH (ref 4.06–5.63)
RDW: 12.9 % (ref 12.0–15.0)
WBC: 8 x10ˆ3/uL (ref 3.5–11.0)

## 2015-03-23 NOTE — Progress Notes (Signed)
See dictated note.

## 2015-03-24 ENCOUNTER — Telehealth (HOSPITAL_BASED_OUTPATIENT_CLINIC_OR_DEPARTMENT_OTHER): Payer: Self-pay | Admitting: Internal Medicine

## 2015-03-24 NOTE — Telephone Encounter (Signed)
Received order for Left Heart Cath with Dr. Mechele Dawley. Spoke with pt, scheduled procedure, and gave him instructions. Pt to report to first floor registration at [facility] 03/27/15 at 7:30am, NPO after MN, May take am meds with sip of water, and he will need someone with him to drive him home. Pt verbalizes understanding of all instructions given and denies any questions or concerns at this time.  Levan Hurst RN

## 2015-03-24 NOTE — Progress Notes (Signed)
[facility]                              DEPARTMENT OF MEDICINE                                [place], [place] [address]                                PATIENT NAME: [name] [name], [name] [name]  HOSPITAL UEAVWU:[id number]  DATE OF SERVICE:03/23/2015  DATE OF BIRTH:     PROGRESS NOTE    PRIMARY CARE PROVIDER:  Randol Kern, PA-C.    SUBJECTIVE:  [name] is a very pleasant 49 year old man who underwent PCI to his LAD in the distant past by Dr. Katrinka Blazing at [facility].  His last PCI to the LAD was in August 2012.  He had an MI at that time.  He has 3 stents in his LAD.  In April 2016, he underwent PTCA to the LAD for in-stent restenosis by Dr. Courtney Heys at  Hospital.  He returns today 6 months later with continued angina symptoms getting worse.  The only thing that is not there is the nausea.  He is on 2 antianginals including Ranexa and beta blockade, but he said his symptoms are worse.  Every time he exerts himself he gets his typical angina symptoms and they are getting worse and more frequent and consistent with unstable angina.    MEDICATION LIST:  1.  Aspirin 81 mg daily.  2.  Lipitor 20 mg daily.  3.  Plavix 75 mg daily.  4.  Cozaar 50 mg daily.  5.  Metoprolol 12.5 mg b.i.d.  6.  Nitroglycerin p.r.n.  7.  Ranexa 500 mg b.i.d.    OBJECTIVE:  Blood pressure is 120/80.  Pulse is 90.  Height is 6 foot 3 inches.  Weight is 275 pounds.  Oxygen saturation is 96% on room air.  General:  No acute distress, very pleasant man.  Head and Neck exam:  No jugular venous distention.  Extraocular muscles intact.  PERRLA. Cardiac exam is regular rate and rhythm, normal S1 and S2 with no significant murmurs.  Lungs are clear to auscultation bilaterally.  No wheezes, rales or rhonchi.  Abdomen is soft and nontender.  Normal abdominal bowel sounds.  Extremities revealed no edema in bilateral lower extremities.    LABORATORY AND TESTS:  Cath films from April  2016 at [facility] were reviewed.  He had balloon angioplasty to the LAD for in-stent restenosis.  There was some residual mild to moderate in-stent restenosis within the stents.  The left circumflex appeared normal.  I did not have images of the right coronary artery, however.    ASSESSMENT:  1.  Unstable angina.  2.  History of coronary artery disease, status post percutaneous coronary intervention (PCI) to the left anterior descending (LAD).  Last PTCA was April 2016, 3 stents in the mid LAD at [facility].  3.  Hypertension.  4.  Dyslipidemia.    RECOMMENDATIONS:  1.  The patient is having symptoms of unstable angina.  He is on optimal medical therapy with 2 antianginals including Ranexa and beta blockade.  Due to unstable angina and recent angioplasty, we will refer him for cardiac catheterization and likely intravascular  ultrasound of the LAD to assess this stent deployment and size and hopefully get a good result.  2.  Continue medications as listed.  Schedule for outpatient cardiac catheterization.  Check basic labs prior to.      Jodi Geralds, MD  Assistant Professor  Interventional Cardiology  [facility]    [id number]; D: 03/23/2015 11:53:17; T: 03/24/2015 04:43:39    cc: Randol Kern PA-C      [facility] [address]      [place], [place] [address]

## 2015-03-27 ENCOUNTER — Ambulatory Visit (HOSPITAL_COMMUNITY): Payer: 59 | Admitting: Internal Medicine

## 2015-03-27 ENCOUNTER — Inpatient Hospital Stay
Admission: RE | Admit: 2015-03-27 | Discharge: 2015-03-27 | Disposition: A | Discharge status: Home / Self Care | Payer: 59 | Source: Ambulatory Visit | Attending: Internal Medicine | Admitting: Internal Medicine

## 2015-03-27 ENCOUNTER — Encounter (HOSPITAL_COMMUNITY): Payer: Self-pay

## 2015-03-27 ENCOUNTER — Encounter (HOSPITAL_COMMUNITY)
Admission: RE | Disposition: A | Discharge status: Home / Self Care | Payer: Self-pay | Source: Ambulatory Visit | Attending: Internal Medicine

## 2015-03-27 DIAGNOSIS — I10 Essential (primary) hypertension: Secondary | ICD-10-CM | POA: Insufficient documentation

## 2015-03-27 DIAGNOSIS — T82828A Fibrosis of vascular prosthetic devices, implants and grafts, initial encounter: Secondary | ICD-10-CM

## 2015-03-27 DIAGNOSIS — I2510 Atherosclerotic heart disease of native coronary artery without angina pectoris: Secondary | ICD-10-CM

## 2015-03-27 DIAGNOSIS — Z7902 Long term (current) use of antithrombotics/antiplatelets: Secondary | ICD-10-CM | POA: Insufficient documentation

## 2015-03-27 DIAGNOSIS — I25119 Atherosclerotic heart disease of native coronary artery with unspecified angina pectoris: Secondary | ICD-10-CM

## 2015-03-27 DIAGNOSIS — Z09 Encounter for follow-up examination after completed treatment for conditions other than malignant neoplasm: Secondary | ICD-10-CM

## 2015-03-27 DIAGNOSIS — Z7982 Long term (current) use of aspirin: Secondary | ICD-10-CM | POA: Insufficient documentation

## 2015-03-27 DIAGNOSIS — E785 Hyperlipidemia, unspecified: Secondary | ICD-10-CM | POA: Insufficient documentation

## 2015-03-27 DIAGNOSIS — Z8249 Family history of ischemic heart disease and other diseases of the circulatory system: Secondary | ICD-10-CM | POA: Insufficient documentation

## 2015-03-27 DIAGNOSIS — T82858A Stenosis of vascular prosthetic devices, implants and grafts, initial encounter: Secondary | ICD-10-CM | POA: Insufficient documentation

## 2015-03-27 DIAGNOSIS — I25110 Atherosclerotic heart disease of native coronary artery with unstable angina pectoris: Secondary | ICD-10-CM | POA: Insufficient documentation

## 2015-03-27 HISTORY — DX: Atherosclerotic heart disease of native coronary artery without angina pectoris: I25.10

## 2015-03-27 HISTORY — DX: Essential (primary) hypertension: I10

## 2015-03-27 HISTORY — DX: Gastro-esophageal reflux disease without esophagitis: K21.9

## 2015-03-27 HISTORY — DX: Unspecified asthma, uncomplicated: J45.909

## 2015-03-27 HISTORY — DX: Pure hypercholesterolemia, unspecified: E78.00

## 2015-03-27 LAB — PT/INR
INR: 0.97 (ref 0.80–1.20)
PROTHROMBIN TIME: 10.9 s (ref 9.0–13.4)

## 2015-03-27 LAB — CBC WITH DIFF
BASOPHIL #: 0.06 x10ˆ3/uL (ref 0.00–0.20)
BASOPHIL %: 1 %
EOSINOPHIL #: 0.24 x10ˆ3/uL (ref 0.00–0.50)
EOSINOPHIL %: 3 %
HCT: 47.8 % — ABNORMAL HIGH (ref 36.7–47.0)
HGB: 16.5 g/dL — ABNORMAL HIGH (ref 12.5–16.3)
LYMPHOCYTE #: 1.71 x10ˆ3/uL (ref 1.00–4.80)
LYMPHOCYTE %: 21 %
MCH: 29.4 pg (ref 27.4–33.0)
MCHC: 34.6 g/dL (ref 32.5–35.8)
MCV: 85.1 fL (ref 78.0–100.0)
MONOCYTE #: 0.85 x10ˆ3/uL (ref 0.30–1.00)
MONOCYTE %: 11 %
MPV: 8.9 fL (ref 7.5–11.5)
NEUTROPHIL #: 5.1 x10ˆ3/uL (ref 1.50–7.70)
NEUTROPHIL %: 64 %
PLATELETS: 227 x10ˆ3/uL (ref 140–450)
RBC: 5.62 x10ˆ6/uL (ref 4.06–5.63)
RDW: 12.5 % (ref 12.0–15.0)
WBC: 8 x10ˆ3/uL (ref 3.5–11.0)

## 2015-03-27 LAB — CREATININE WITH EGFR
CREATININE: 1.13 mg/dL (ref 0.62–1.27)
ESTIMATED GFR: >59 mL/min/1.73mˆ2 (ref >59)

## 2015-03-27 LAB — TYPE AND SCREEN
ABO/RH(D): O POS
ANTIBODY SCREEN: NEGATIVE

## 2015-03-27 LAB — POC ACT CELITE (RESULTS): ACT CELITE, POC: 315 s — ABNORMAL HIGH

## 2015-03-27 LAB — PTT (PARTIAL THROMBOPLASTIN TIME): APTT: 36.4 s (ref 25.1–36.5)

## 2015-03-27 SURGERY — CORONARY ANGIOGRAPHY W/LEFT HEART CATH W/WO LVG

## 2015-03-27 MED ORDER — FENTANYL (PF) 50 MCG/ML INJECTION SOLUTION
Freq: Once | INTRAMUSCULAR | Status: DC | PRN
Start: 2015-03-27 — End: 2015-03-27
  Administered 2015-03-27 (×4): 25 ug via INTRAVENOUS

## 2015-03-27 MED ORDER — CLOPIDOGREL 300 MG TABLET
ORAL_TABLET | ORAL | Status: AC
Start: 2015-03-27 — End: 2015-03-27
  Filled 2015-03-27: qty 1

## 2015-03-27 MED ORDER — MIDAZOLAM 1 MG/ML INJECTION SOLUTION
INTRAMUSCULAR | Status: AC
Start: 2015-03-27 — End: 2015-03-27
  Filled 2015-03-27: qty 4

## 2015-03-27 MED ORDER — FENTANYL (PF) 50 MCG/ML INJECTION SOLUTION
INTRAMUSCULAR | Status: AC
Start: 2015-03-27 — End: 2015-03-27
  Filled 2015-03-27: qty 2

## 2015-03-27 MED ORDER — MIDAZOLAM 1 MG/ML INJECTION SOLUTION
Freq: Once | INTRAMUSCULAR | Status: DC | PRN
Start: 2015-03-27 — End: 2015-03-27
  Administered 2015-03-27: 2 mg via INTRAVENOUS
  Administered 2015-03-27: 1 mg via INTRAVENOUS

## 2015-03-27 MED ORDER — HEPARIN (PORCINE) 1,000 UNIT/ML INJECTION SOLUTION
INTRAMUSCULAR | Status: AC
Start: 2015-03-27 — End: 2015-03-27
  Filled 2015-03-27: qty 10

## 2015-03-27 MED ORDER — LIDOCAINE HCL 20 MG/ML (2 %) INJECTION SOLUTION
Freq: Once | INTRAMUSCULAR | Status: DC | PRN
Start: 2015-03-27 — End: 2015-03-27
  Administered 2015-03-27: 1 mL via INTRADERMAL

## 2015-03-27 MED ORDER — NITROGLYCERIN 12.5 MG IN NS 250 ML
Freq: Once | Status: DC | PRN
Start: 2015-03-27 — End: 2015-03-27
  Administered 2015-03-27: 100 ug via INTRA_ARTERIAL
  Administered 2015-03-27: 200 ug via INTRA_ARTERIAL
  Administered 2015-03-27: 150 ug via INTRACORONARY
  Administered 2015-03-27 (×2): 200 ug via INTRACORONARY

## 2015-03-27 MED ORDER — ADENOSINE 5MG IN NS 250ML IV BOLUS
Freq: Once | Status: DC | PRN
Start: 2015-03-27 — End: 2015-03-27
  Administered 2015-03-27: 150 ug via INTRACORONARY
  Administered 2015-03-27: 100 ug via INTRACORONARY

## 2015-03-27 MED ORDER — LIDOCAINE HCL 20 MG/ML (2 %) INJECTION SOLUTION
INTRAMUSCULAR | Status: AC
Start: 2015-03-27 — End: 2015-03-27
  Filled 2015-03-27: qty 20

## 2015-03-27 MED ORDER — HEPARIN (PORCINE) 1,000 UNIT/ML INJECTION SOLUTION
Freq: Once | INTRAMUSCULAR | Status: DC | PRN
Start: 2015-03-27 — End: 2015-03-27
  Administered 2015-03-27: 8800 [IU] via INTRAVENOUS
  Administered 2015-03-27: 2000 [IU]

## 2015-03-27 MED ORDER — VERAPAMIL 2.5 MG/ML INTRAVENOUS SOLUTION
Freq: Once | INTRAVENOUS | Status: DC | PRN
Start: 2015-03-27 — End: 2015-03-27
  Administered 2015-03-27: 5 mg via INTRA_ARTERIAL

## 2015-03-27 MED ORDER — IOPAMIDOL 300 MG IODINE/ML (61 %) INTRAVENOUS SOLUTION
Freq: Once | INTRAVENOUS | Status: DC | PRN
Start: 2015-03-27 — End: 2015-03-27
  Administered 2015-03-27: 190 mL via INTRA_ARTERIAL

## 2015-03-27 MED ORDER — VERAPAMIL 2.5 MG/ML INTRAVENOUS SOLUTION
INTRAVENOUS | Status: AC
Start: 2015-03-27 — End: 2015-03-27
  Filled 2015-03-27: qty 2

## 2015-03-27 MED ORDER — CLOPIDOGREL 300 MG TABLET
300.0000 mg | ORAL_TABLET | Freq: Once | ORAL | Status: AC
Start: 2015-03-27 — End: 2015-03-27
  Administered 2015-03-27: 300 mg via ORAL

## 2015-03-27 MED ORDER — SODIUM CHLORIDE 0.9 % INTRAVENOUS SOLUTION
INTRAVENOUS | Status: DC
Start: 2015-03-27 — End: 2015-03-27

## 2015-03-27 SURGICAL SUPPLY — 26 items
CATH ANGIO 5FR FR4 CURVE 100CM EXPO FULL LGTH WRE BRD RBST SHAFT VENTRIC TRILON (DIAGNOSTIC) ×3
CATH ANGIO 5FR FR4 CURVE 100CM_EXPO FULLLGTH WRE BRD RBST (DIAGNOSTIC) ×1
CATH APEX 2.5MM 20MM 142CM OTW RADOPQ BAL DIL OPTILEAP ACPT .014IN GW PTCA (BALLOON) ×3 IMPLANT
CATH APX 2.5MM 20MM 142CM OTW_RADOPQ BAL DIL OPTILEAP ACPT (BALLOON) ×1
CATH ASCULPT 3MM 15MM 137CM RX_TPR TIP BAL DIL NTNL ACPT (BALLOON) ×1
CATH ASCULPT EVO 3MM 15MM RX BAL DIL HDRPH STRL LF  PTCA (BALLOON) ×3 IMPLANT
CATH ASCULPT EVO 3MM 15MM RX BAL DIL HDRPH STRL LF PTCA (BALLOON) ×3 IMPLANT
CATH GD 6FR 100CM STD JL3.5 CURVE LNCHR RADOPQ LRG LUM FLXB DIST SEG COR NYL STRL LF (GUIDING) ×3 IMPLANT
CATH GD 6FR 100CM STD JL3.5 CU_RVE LNCHR LRG LUM RADOPQ FLXB (GUIDING) ×2
CATH NC QTM APEX MNRL 3.25MM 20MM 143CM RADOPQ BAL DIL ACPT .014IN GW PTCA (BALLOON) ×3 IMPLANT
CATH NC QTM APX MNRL 3.25MM 20_MM 143CM RADOPQ BAL DIL ACPT (BALLOON) ×1
CATH US EE PLAT 3.3-2.9FR 20MM 150CM 24CM 20MHZ RX LUM RADOPQ TAPER TIP GLYDX ACPT .014IN GW TRKBK (ULTRASOUND) ×3 IMPLANT
CATH US EE PLAT 3.3-2.9FR 20MM_150CM 24CM 20MHZ RX LUM (ULTRASOUND) ×1
DEVICE COMPRESS TR BAND 29CM 2 BAL TRNSPR ADJ STRAP AIR TRTN LRG RADIAL ART (BALLOON) ×3 IMPLANT
DEVICE COMPRESS TR BAND 29CM 2_BAL TRNSPR ADJ STRAP AIR TRTN (BALLOON) ×1
DEVICE INFLAT GATEWAY ENCR ADV 3MM 20MM KIT TORQUE GW INTROD 20ML (KITS & TRAYS (DISPOSABLE)) ×3 IMPLANT
DEVICE INFLAT GATEWAY ENCR ADV_3MM 20MM KIT TRQ GW INTROD 20 (KITS & TRAYS (DISPOSABLE)) ×1
DISCONTINUED USE 329965 - CATH ANGIO 5FR FR4 CURVE 100CM EXPO FULL LGTH WRE BRD RBST SHAFT VENTRIC TRILON (DIAGNOSTIC) ×3 IMPLANT
GW .038IN 3MM 260CM ANGIO STD TAPER COR J STRL LF  DISP (WIRE) ×3 IMPLANT
GW .038IN 6CM 260CM STD TIP FI_COR EXCH WRE PTFE VAS 3MM (WIRE) ×1
GW RUNTHROUGH NS .014IN 3CM 300CM ATRAUMA X FLPY STR TIP RADOPQ NITINOL HDRPH VAS COR (WIRE) ×9 IMPLANT
GW RUNTHROUGH NS .014IN 3CM 30_CM ATRM X FLPY TIP RADOPQ (WIRE) ×3
KIT ANGIO NAMIC MTS LHK STRL LF  DISP RNLD MEM HOSPITAL (MISCELLANEOUS PT CARE ITEMS) ×3 IMPLANT
KIT INTROD 10CM 6FR 22GA GLIDESHEATH SLNDR .021IN PLASTIC SHEATH DIL 2 WL PNCT SHORT ANG MINIWIRE 45 (GUIDING) ×3 IMPLANT
KIT INTROD 10CM 6FR 22GA GLIDE_SHEATH SLNDR .021IN PLASTIC (GUIDING) ×1
MANIFOLD CARIDAC 4V DYE SET (MISCELLANEOUS PT CARE ITEMS) ×1

## 2015-03-27 NOTE — Discharge Instructions (Signed)
Discharge Instructions after Your Cardiac Catheterization with Radial Approach      Activity  For the next 24 hours  . You may feel sleepy.  Do not drive or operate machinery or power tools.  (You must have someone take you home)  . Do not drink any alcoholic beverages  . Do not make any important decisions or sign any legal documents  . Avoid any pushing or pulling motions with affected wrist for 3 days  . Do not lift with the affected arm for 48 hours  . Follow your doctors instructions about returning to work      Care of Wound  . Keep the dressing in place for 24 hours.  You may remove the dressing and shower after 24 hours.  . Cleanse the site gently with soap and water. Pat area dry. No lotions or creams.  Leave the site open to air.  . Do not get your wrist saturated for 5-7 days or until the wound is healed.      Diet  . You may resume your normal diet.  . To help eliminate contrast material from your body, we encourage you to increase your fluid intake   If you are taking Metformin (Glucophage) hold this medication for 3 days and then resume as prescribed.    Call your doctor if you have:  . If bleeding or a hard bruise develops under the skin, apply manual pressure and call 911 immediately.  . Signs that you may be bleeding are: tightness, stinging, pain, warmness, wetness where the puncture site was put in your wrist.    . If bleeding occurs, lie down and apply firm pressure about 2 fingers above the site.  Call 911 and keep pressure on the site.  . Severe Pain, numbness, tingling, change in color or coldness of the arm used for puncture site.   . Temperature greater than 101 degrees F.  . Redness around the site.  . Drainage from the site.    Moderate Conscious Sedation, Adult, Care After  Refer to this sheet in the next few weeks. These instructions provide you with information on caring for yourself after your procedure. Your health care provider may also give you more specific instructions. Your  treatment has been planned according to current medical practices, but problems sometimes occur. Call your health care provider if you have any problems or questions after your procedure.  WHAT TO EXPECT AFTER THE PROCEDURE   After your procedure:   You may feel sleepy, clumsy, and have poor balance for several hours.   Vomiting may occur if you eat too soon after the procedure.  HOME CARE INSTRUCTIONS   Do not participate in any activities where you could become injured for at least 24 hours. Do not:    Drive.    Swim.    Ride a bicycle.    Operate heavy machinery.    Cook.    Use power tools.    Climb ladders.    Work from a high place.   Do not make important decisions or sign legal documents until you are improved.   If you vomit, drink water, juice, or soup when you can drink without vomiting. Make sure you have little or no nausea before eating solid foods.   Only take over-the-counter or prescription medicines for pain, discomfort, or fever as directed by your health care provider.   Make sure you and your family fully understand everything about the medicines given to you,   including what side effects may occur.   You should not drink alcohol, take sleeping pills, or take medicines that cause drowsiness for at least 24 hours.   If you smoke, do not smoke without supervision.   If you are feeling better, you may resume normal activities 24 hours after you were sedated.   Keep all appointments with your health care provider.  SEEK MEDICAL CARE IF:   Your skin is pale or bluish in color.   You continue to feel nauseous or vomit.   Your pain is getting worse and is not helped by medicine.   You have bleeding or swelling.   You are still sleepy or feeling clumsy after 24 hours.  SEEK IMMEDIATE MEDICAL CARE IF:   You develop a rash.   You have difficulty breathing.   You develop any type of allergic problem.   You have a fever.  MAKE SURE YOU:   Understand these instructions.   Will watch your  condition.   Will get help right away if you are not doing well or get worse.     This information is not intended to replace advice given to you by your health care provider. Make sure you discuss any questions you have with your health care provider.     Document Released: 03/27/2013 Document Revised: 06/27/2014 Document Reviewed: 03/27/2013  Elsevier Interactive Patient Education 2016 [employer] Inc[employer]

## 2015-03-27 NOTE — Nurses Notes (Signed)
Cardiac Catheterization Lab A       Cardiac Catheterization - Dr.Roda & Dr.Almustafa    Mr.Knauer arrived via bed. IVF in place via PIV. Awake,reports no angina/pain/or other problems currently. Allergy noted. Position,procedure reviewed,questions solicted,sedation per order.    Sheath placed R radial.      [id number] Preparation for PTCA. Dr.Hurley joined. Heparin given. ACT sample@0913 =315.  PTCA:LAD(mid)  Apex(2.5-20),  IVUS,  NC Quantum Apex(3.25-20),  Angiosculpt(3-15)

## 2015-03-27 NOTE — H&P (Addendum)
[name]  H & P UPDATE                                                      [name], [name], 49 y.o. male  Date of Admission:  03/27/2015  Date of Birth:      Date:  03/27/2015    STOP: IF H&P IS GREATER THAN 30 DAYS FROM SURGICAL DAY COMPLETE NEW H&P IS REQUIRED.    Outpatient Pre-Surgical H & P updated the day of the procedure.  1.  H&P the patient has been examined, and no change has occured in the patients condition since the H&P was completed.       Change in medications: No      Last Menstrual Period: N/A     2.  Patient continues to be appropiate candidate for planned surgical procedure. YES.            I saw and examined the patient.  I reviewed the resident's note.  I agree with the findings and plan of care as documented in the resident's note.  Any exceptions/additions are edited/noted.    Jodi Geralds, MD

## 2015-03-27 NOTE — Progress Notes (Addendum)
[facility] -    Cardiology  Pre-Cardiac Catheterization/ Intervention H&P Note      History and Risk Factors:    Current/Recent Smoker (within 1 year): No  Hypertension:   (BP greater than or equal 140/90 or on pharmacologic therapy/treatment for hypertension): Yes  Dyslipidemia: (Total Chol greater than 200 or LDL greater than 130 mg/dL): Yes  Fam Hx of Premature CAD:(male less than or equal to 28yrs; male less than or equal to 65 yrs): Yes  Cerebrovascular Disease :  Stroke/CVA/TIA/Carotid Test with greater than 79% occlusion/Carotid surgery/intervention for carotid stenosis: No  Peripheral Arterial Disease:  (claudication/amputation/vascular reconstruction, bypass, peripheral stenting/AO aneurysm with or without repair/positive noninvasive testing (ABI less than or equal 0.9, ultrasound, MR, CT, angiographic results greater than 50% (renal, subclavian, femoral, etc.) No  Diabetes Mellitus:  No  Currently On Dialysis:  No  Chronic Lung Disease:  (Includes:  Asbestos, mesothelioma, black lung, pneumoconiosis, radiation induced pneumonitis, COPD, chronic bronchitis, emphysema, long term steroid therapy  - does not include asthma and seasonal allergies) No  Cardiac Arrest w/in 24 Hours: No  Cardiogenic Shock:  A sustained (greater than 30 minutes) episode of SBP less than 90 and CI less than 2.2 and/or requirement of inotropic/vasopressor agents/mechanical support (IABP/Impella/LVAD) to maintain BP and CI above noted parameters No  Prior history of MI:  Yes  Prior PCI: Yes: Most Recent PCI Date: 2016  MI this admission:  No      CAD Presentation:  Unstable angina in last 60 days 1. rest angina 2. New onset angina CCS III severity 3. Increasing angina to CCS III - despite antianginals  Anginal Classification w/in 2 Weeks :  CCS III  marked limitation of activity - comfortable at rest  Prior CABG: No  Prior Valve Surgery/Procedure:  No  Pre-operative Evaluation before Non-Cardiac Surgery: No  Prior  Heart Failure: No  Cardiomyopathy: No  LV Systolic Dysfunction:  No  Stress or Imaging Studies Performed (within last 6 months):  No    Alma Friendly, MD  03/27/2015, 08:24        Jodi Geralds, MD  03/27/2015, 11:59

## 2015-03-27 NOTE — Progress Notes (Addendum)
**Note [name]-Identified via Obfuscation** [facility]  Cardiac Catheterization Brief  Note    Name:  [name] [name]  MRN:  [id number]  Date of service:  03/27/2015    Access:  Radial artery.  Complications:  None.    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     Recommendations:    Continue aggressive risk factors modifications.   Patient will be discharged home later today.    Full note to follow.    Terance Hart, MD, 03/27/2015, 10:12  Fellow, Cardiovascular disease  [facility]      Jodi Geralds, MD  03/27/2015, 11:59

## 2015-03-28 LAB — ECG 12-LEAD
Atrial Rate: 86 {beats}/min
Atrial Rate: 74 {beats}/min
Calculated P Axis: 22 degrees
Calculated P Axis: 12 degrees
Calculated R Axis: 31 degrees
Calculated R Axis: 45 degrees
Calculated T Axis: 29 degrees
Calculated T Axis: 37 degrees
PR Interval: 182 ms
PR Interval: 202 ms
QRS Duration: 110 ms
QRS Duration: 92 ms
QT Interval: 354 ms
QT Interval: 390 ms
QTC Calculation: 423 ms
QTC Calculation: 432 ms
Ventricular rate: 86 {beats}/min
Ventricular rate: 74 {beats}/min

## 2015-03-29 LAB — EKG 12-LEAD (POST CARD INTERVENTION FOLLOW UP)
Atrial Rate: 74 {beats}/min
Calculated P Axis: 24 degrees
Calculated R Axis: 21 degrees
Calculated R Axis: 21 degrees
Calculated T Axis: 36 degrees
PR Interval: 192 ms
QRS Duration: 108 ms
QT Interval: 390 ms
QTC Calculation: 432 ms
Ventricular rate: 74 {beats}/min

## 2015-04-13 ENCOUNTER — Telehealth (HOSPITAL_BASED_OUTPATIENT_CLINIC_OR_DEPARTMENT_OTHER): Payer: Self-pay | Admitting: Internal Medicine

## 2015-04-13 ENCOUNTER — Other Ambulatory Visit (HOSPITAL_BASED_OUTPATIENT_CLINIC_OR_DEPARTMENT_OTHER): Payer: Self-pay | Admitting: Internal Medicine

## 2015-04-13 DIAGNOSIS — I200 Unstable angina: Secondary | ICD-10-CM

## 2015-04-13 NOTE — Telephone Encounter (Signed)
[name] [name] called in stating that he is not feeling any better since his cath.  He is more short of breath, and has been trying to exercise by walking, but if he walks greater than 1/8th mile, his chest gets tight.  He also states he is very tired all of the time even without exertion.  Dr Basilio Cairooda-Renzelli has been informed, and has placed an order for cardiac cath.  I have called [name] [name] to inform him of this. He is in agreement.

## 2015-04-21 ENCOUNTER — Encounter (HOSPITAL_COMMUNITY): Payer: Self-pay

## 2015-04-21 ENCOUNTER — Inpatient Hospital Stay
Admission: RE | Admit: 2015-04-21 | Discharge: 2015-04-21 | Disposition: A | Discharge status: Home / Self Care | Payer: 59 | Source: Ambulatory Visit | Attending: Internal Medicine | Admitting: Internal Medicine

## 2015-04-21 ENCOUNTER — Ambulatory Visit (HOSPITAL_COMMUNITY): Payer: 59 | Admitting: Internal Medicine

## 2015-04-21 ENCOUNTER — Encounter (HOSPITAL_COMMUNITY)
Admission: RE | Disposition: A | Discharge status: Home / Self Care | Payer: Self-pay | Source: Ambulatory Visit | Attending: Internal Medicine

## 2015-04-21 DIAGNOSIS — Z9861 Coronary angioplasty status: Secondary | ICD-10-CM

## 2015-04-21 DIAGNOSIS — Z7982 Long term (current) use of aspirin: Secondary | ICD-10-CM

## 2015-04-21 DIAGNOSIS — I10 Essential (primary) hypertension: Secondary | ICD-10-CM

## 2015-04-21 DIAGNOSIS — I2510 Atherosclerotic heart disease of native coronary artery without angina pectoris: Secondary | ICD-10-CM

## 2015-04-21 DIAGNOSIS — E785 Hyperlipidemia, unspecified: Secondary | ICD-10-CM | POA: Insufficient documentation

## 2015-04-21 DIAGNOSIS — Z01818 Encounter for other preprocedural examination: Secondary | ICD-10-CM

## 2015-04-21 DIAGNOSIS — Z7902 Long term (current) use of antithrombotics/antiplatelets: Secondary | ICD-10-CM | POA: Insufficient documentation

## 2015-04-21 DIAGNOSIS — I25110 Atherosclerotic heart disease of native coronary artery with unstable angina pectoris: Secondary | ICD-10-CM | POA: Insufficient documentation

## 2015-04-21 DIAGNOSIS — R079 Chest pain, unspecified: Secondary | ICD-10-CM

## 2015-04-21 DIAGNOSIS — I200 Unstable angina: Secondary | ICD-10-CM

## 2015-04-21 HISTORY — DX: Sleep apnea, unspecified: G47.30

## 2015-04-21 HISTORY — DX: Dependence on other enabling machines and devices: Z99.89

## 2015-04-21 LAB — BASIC METABOLIC PANEL
ANION GAP: 6 mmol/L (ref 4–13)
BUN/CREA RATIO: 17 (ref 6–22)
BUN: 16 mg/dL (ref 8–25)
BUN: 16 mg/dL (ref 8–25)
CALCIUM: 8.6 mg/dL (ref 8.5–10.4)
CHLORIDE: 106 mmol/L (ref 96–111)
CO2 TOTAL: 26 mmol/L (ref 22–32)
CREATININE: 0.96 mg/dL (ref 0.62–1.27)
ESTIMATED GFR: >59 mL/min/1.73mˆ2 (ref >59)
GLUCOSE: 106 mg/dL (ref 65–139)
POTASSIUM: 4 mmol/L (ref 3.5–5.1)
SODIUM: 138 mmol/L (ref 136–145)

## 2015-04-21 LAB — H & H
HCT: 47.6 % — ABNORMAL HIGH (ref 36.7–47.0)
HCT: 47.6 % — ABNORMAL HIGH (ref 36.7–47.0)
HGB: 16 g/dL (ref 12.5–16.3)

## 2015-04-21 LAB — TYPE AND SCREEN
ABO/RH(D): O POS
ANTIBODY SCREEN: NEGATIVE

## 2015-04-21 LAB — CREATININE: ESTIMATED GFR: >59 mL/min/1.73m?2 (ref >59)

## 2015-04-21 LAB — CREATININE WITH EGFR
CREATININE: 1 mg/dL (ref 0.62–1.27)
ESTIMATED GFR: >59 mL/min/1.73m?2 (ref >59)

## 2015-04-21 SURGERY — CORONARY ANGIOGRAPHY W/LEFT HEART CATH W/WO LVG
Laterality: Right

## 2015-04-21 MED ORDER — MIDAZOLAM 1 MG/ML INJECTION SOLUTION
Freq: Once | INTRAMUSCULAR | Status: DC | PRN
Start: 2015-04-21 — End: 2015-04-21
  Administered 2015-04-21: 2 mg via INTRAVENOUS
  Administered 2015-04-21: 1 mg via INTRAVENOUS

## 2015-04-21 MED ORDER — SODIUM CHLORIDE 0.9 % INTRAVENOUS SOLUTION
INTRAVENOUS | Status: DC
Start: 2015-04-21 — End: 2015-04-21
  Administered 2015-04-21: 0 via INTRAVENOUS

## 2015-04-21 MED ORDER — LIDOCAINE HCL 20 MG/ML (2 %) INJECTION SOLUTION
Freq: Once | INTRAMUSCULAR | Status: DC | PRN
Start: 2015-04-21 — End: 2015-04-21

## 2015-04-21 MED ORDER — VERAPAMIL 2.5 MG/ML INTRAVENOUS SOLUTION
Freq: Once | INTRAVENOUS | Status: DC | PRN
Start: 2015-04-21 — End: 2015-04-21
  Administered 2015-04-21: 5 mg via INTRA_ARTERIAL

## 2015-04-21 MED ORDER — FENTANYL (PF) 50 MCG/ML INJECTION SOLUTION
INTRAMUSCULAR | Status: AC
Start: 2015-04-21 — End: 2015-04-21
  Filled 2015-04-21: qty 2

## 2015-04-21 MED ORDER — AMLODIPINE 2.5 MG TABLET
2.5000 mg | ORAL_TABLET | Freq: Every day | ORAL | Status: DC
Start: 2015-04-21 — End: 2015-11-19

## 2015-04-21 MED ORDER — NITROGLYCERIN 12.5 MG IN NS 250 ML
Freq: Once | Status: DC | PRN
Start: 2015-04-21 — End: 2015-04-21
  Administered 2015-04-21: 200 ug via INTRA_ARTERIAL

## 2015-04-21 MED ORDER — FENTANYL (PF) 50 MCG/ML INJECTION SOLUTION
Freq: Once | INTRAMUSCULAR | Status: DC | PRN
Start: 2015-04-21 — End: 2015-04-21
  Administered 2015-04-21 (×2): 25 ug via INTRAVENOUS

## 2015-04-21 MED ORDER — MIDAZOLAM 1 MG/ML INJECTION SOLUTION
INTRAMUSCULAR | Status: AC
Start: 2015-04-21 — End: 2015-04-21
  Filled 2015-04-21: qty 8

## 2015-04-21 MED ORDER — HEPARIN (PORCINE) 1,000 UNIT/ML INJECTION SOLUTION
INTRAMUSCULAR | Status: AC
Start: 2015-04-21 — End: 2015-04-21
  Filled 2015-04-21: qty 10

## 2015-04-21 MED ORDER — HEPARIN (PORCINE) 1,000 UNIT/ML INJECTION SOLUTION
Freq: Once | INTRAMUSCULAR | Status: DC | PRN
Start: 2015-04-21 — End: 2015-04-21
  Administered 2015-04-21: 2000 [IU] via INTRA_ARTERIAL

## 2015-04-21 MED ORDER — IOPAMIDOL 300 MG IODINE/ML (61 %) INTRAVENOUS SOLUTION
Freq: Once | INTRAVENOUS | Status: DC | PRN
Start: 2015-04-21 — End: 2015-04-21
  Administered 2015-04-21: 100 mL via INTRA_ARTERIAL

## 2015-04-21 MED ORDER — VERAPAMIL 2.5 MG/ML INTRAVENOUS SOLUTION
INTRAVENOUS | Status: AC
Start: 2015-04-21 — End: 2015-04-21
  Filled 2015-04-21: qty 2

## 2015-04-21 MED ORDER — DIPHENHYDRAMINE 50 MG/ML INJECTION SOLUTION
INTRAMUSCULAR | Status: AC
Start: 2015-04-21 — End: 2015-04-21
  Filled 2015-04-21: qty 1

## 2015-04-21 MED ADMIN — docusate sodium 100 mg capsule: INTRA_ARTERIAL | @ 08:00:00

## 2015-04-21 SURGICAL SUPPLY — 12 items
CATH ANGIO 5FR 145D PGTL CURVE 110CM EXPO FULL LGTH WRE BRD RBST SHAFT PERI CARDIAC TRILON (DIAGNOSTIC) ×2 IMPLANT
CATH ANGIO 5FR 145D PGTL CURVE_110CM EXPO FULL LGTH WRE BRD (DIAGNOSTIC) ×1
CATH ANGIO 5FR RADIAL TIG 4 CURVE 100CM OPTITORQUE LRG LUM SH 2 BRD SFT TIP COR SS NYL POLYUR (CARDIAC) ×2 IMPLANT
CATH ANGIO 5FR RADL TIG 4 CURV_E 100CM OPTITORQUE LRG LUM SH (CARDIAC) ×1
DEVICE COMPRESS TR BAND 29CM 2 BAL TRNSPR ADJ STRAP AIR TRTN LRG RADIAL ART (BALLOON) ×2 IMPLANT
DEVICE COMPRESS TR BAND 29CM 2_BAL TRNSPR ADJ STRAP AIR TRTN (BALLOON) ×1
GW .038IN 3MM 260CM ANGIO STD TAPER COR J STRL LF  DISP (WIRE) ×2 IMPLANT
GW .038IN 6CM 260CM STD TIP FI_COR EXCH WRE PTFE VAS 3MM (WIRE) ×1
KIT ANGIO NAMIC MTS LHK STRL LF  DISP RNLD MEM HOSPITAL (MISCELLANEOUS PT CARE ITEMS) ×2 IMPLANT
KIT INTROD 10CM 6FR 22GA GLIDESHEATH SLNDR .021IN PLASTIC SHEATH DIL 2 WL PNCT SHORT ANG MINIWIRE 45 (GUIDING) ×4 IMPLANT
KIT INTROD 10CM 6FR 22GA GLIDE_SHEATH SLNDR .021IN PLASTIC (GUIDING) ×2
MANIFOLD CARIDAC 4V DYE SET (MISCELLANEOUS PT CARE ITEMS) ×1

## 2015-04-21 NOTE — Nurses Notes (Signed)
Arrive [facility] ambulatory. Siderails up x 3. Bed brake on. HOB elevated. Bed in lowest position. Family to bedside.

## 2015-04-21 NOTE — Discharge Instructions (Signed)
Discharge Instructions after Your Cardiac Catheterization with Radial Approach      Activity  For the next 24 hours  . You may feel sleepy.  Do not drive or operate machinery or power tools.  (You must have someone take you home)  . Do not drink any alcoholic beverages  . Do not make any important decisions or sign any legal documents  . Avoid any pushing or pulling motions with affected wrist for 3 days  . Do not lift with the affected arm for 48 hours  . Follow your doctors instructions about returning to work      Care of Wound  . Keep the dressing in place for 24 hours.  You may remove the dressing and shower after 24 hours.  . Cleanse the site gently with soap and water. Pat area dry. No lotions or creams.  Leave the site open to air.  . Do not get your wrist saturated for 5-7 days or until the wound is healed.      Diet  . You may resume your normal diet.  . To help eliminate contrast material from your body, we encourage you to increase your fluid intake   If you are taking Metformin (Glucophage) hold this medication for 3 days and then resume as prescribed.    Call your doctor if you have:  . If bleeding or a hard bruise develops under the skin, apply manual pressure and call 911 immediately.  . Signs that you may be bleeding are: tightness, stinging, pain, warmness, wetness where the puncture site was put in your wrist.    . If bleeding occurs, lie down and apply firm pressure about 2 fingers above the site.  Call 911 and keep pressure on the site.  . Severe Pain, numbness, tingling, change in color or coldness of the arm used for puncture site.   . Temperature greater than 101 degrees F.  . Redness around the site.  . Drainage from the site.

## 2015-04-21 NOTE — Nurses Notes (Signed)
Patient received from Cardiac Catheterization lab via bed. Supine. Placed on ECG monitor, Pulse Oximetry and NIBP. Side rails up x 3. Bed brake on. Bed in lowest position. Family to bedside.

## 2015-04-21 NOTE — H&P (Addendum)
[facility]                                                     H&P Update Form    [name],[name] [name], 49 y.o. male  Date of Admission:  04/21/2015  Date of Birth:      04/21/2014    STOP: IF H&P IS GREATER THAN 30 DAYS FROM SURGICAL DAY COMPLETE NEW H&P IS REQUIRED.     H & P updated the day of the procedure.  1.  H&P completed within 30 days of surgical procedure by Dr. Basilio Cairo on 03/23/15  and has been reviewed within 24 hours of the surgery, the patient has been examined, and no change has occured in the patients condition since the H&P was completed.       Change in medications: No      Last Menstrual Period: Not applicable      Comments:     2.  Patient continues to be appropiate candidate for planned surgical procedure. YES        [facility] -    Cardiology  Pre-Cardiac Catheterization/ Intervention H&P Note      History and Risk Factors:    Current/Recent Smoker (within 1 year): No  Hypertension:   (BP greater than or equal 140/90 or on pharmacologic therapy/treatment for hypertension): Yes  Dyslipidemia: (Total Chol greater than 200 or LDL greater than 130 mg/dL): Yes  Fam Hx of Premature CAD:(male less than or equal to 51yrs; male less than or equal to 65 yrs): Yes  Cerebrovascular Disease :  Stroke/CVA/TIA/Carotid Test with greater than 79% occlusion/Carotid surgery/intervention for carotid stenosis: No  Peripheral Arterial Disease:  (claudication/amputation/vascular reconstruction, bypass, peripheral stenting/AO aneurysm with or without repair/positive noninvasive testing (ABI less than or equal 0.9, ultrasound, MR, CT, angiographic results greater than 50% (renal, subclavian, femoral, etc.) No  Diabetes Mellitus:  No  Currently On Dialysis:  No  Chronic Lung Disease:  (Includes:  Asbestos, mesothelioma, black lung, pneumoconiosis, radiation induced pneumonitis, COPD, chronic bronchitis, emphysema, long term steroid therapy  -  does not include asthma and seasonal allergies) yes  Cardiac Arrest w/in 24 Hours: no  Cardiogenic Shock:  [name] sustained (greater than 30 minutes) episode of SBP less than 90 and CI less than 2.2 and/or requirement of inotropic/vasopressor agents/mechanical support (IABP/Impella/LVAD) to maintain BP and CI above noted parameters No  Prior history of MI:  yes  Prior PCI: Yes: Most Recent PCI Date: 03/27/15  MI this admission:  No      CAD Presentation:  Unstable angina in last 60 days 1. rest angina 2. New onset angina CCS III severity 3. Increasing angina to CCS III - despite antianginals  Anginal Classification w/in 2 Weeks :  CCS III  marked limitation of activity - comfortable at rest  Prior CABG: No  Prior Valve Surgery/Procedure:  No  Pre-operative Evaluation before Non-Cardiac Surgery: No  Prior Heart Failure: No  Cardiomyopathy: No  LV Systolic Dysfunction:  No  Stress or Imaging Studies Performed (within last 6 months):  No    Sheliah Plane. Paschal Dopp, MD  Fellow, Section of Cardiology   [facility] Department of Medicine    04/21/2015, 06:50                  I saw and examined the patient.  I reviewed the resident's note.  I agree with the findings and plan of care as documented in the resident's note.  Any exceptions/additions are edited/noted.    Jodi GeraldsAnthony Roda-Renzelli, MD\

## 2015-04-21 NOTE — Progress Notes (Addendum)
[facility]  Cardiac Catheterization Brief  Note    Name:  [name] [name]  MRN:  [id number]  Date of service:  04/21/2015    Access:  Rt Radial  Complications:  none    Findings:    Patent LAD stent  No obstructive CAD    Recommendations:    Medical management  Risk factor modifications    The patient will be discharged home today.  Full note to follow.    Carmin RichmondAli M Hama Amin, MD, 04/21/2015, 08:55  Fellow, Cardiovascular disease  [facility]      Jodi GeraldsAnthony Roda-Renzelli, MD  04/21/2015, 10:34

## 2015-04-23 LAB — ECG 12-LEAD
Calculated R Axis: 23 degrees
Calculated T Axis: 32 degrees
QRS Duration: 96 ms
QT Interval: 364 ms
Ventricular rate: 73 {beats}/min

## 2015-04-27 ENCOUNTER — Encounter (HOSPITAL_BASED_OUTPATIENT_CLINIC_OR_DEPARTMENT_OTHER): Payer: Self-pay | Admitting: Internal Medicine

## 2015-04-27 ENCOUNTER — Ambulatory Visit: Payer: 59 | Attending: Internal Medicine | Admitting: Internal Medicine

## 2015-04-27 VITALS — BP 122/78 | HR 103 | Ht 76.0 in | Wt 285.3 lb

## 2015-04-27 DIAGNOSIS — Z7982 Long term (current) use of aspirin: Secondary | ICD-10-CM | POA: Insufficient documentation

## 2015-04-27 DIAGNOSIS — R0609 Other forms of dyspnea: Secondary | ICD-10-CM | POA: Insufficient documentation

## 2015-04-27 DIAGNOSIS — Z79899 Other long term (current) drug therapy: Secondary | ICD-10-CM | POA: Insufficient documentation

## 2015-04-27 DIAGNOSIS — I10 Essential (primary) hypertension: Secondary | ICD-10-CM

## 2015-04-27 DIAGNOSIS — E785 Hyperlipidemia, unspecified: Secondary | ICD-10-CM | POA: Insufficient documentation

## 2015-04-27 DIAGNOSIS — I2510 Atherosclerotic heart disease of native coronary artery without angina pectoris: Secondary | ICD-10-CM

## 2015-04-27 DIAGNOSIS — Z7902 Long term (current) use of antithrombotics/antiplatelets: Secondary | ICD-10-CM | POA: Insufficient documentation

## 2015-04-27 DIAGNOSIS — J45909 Unspecified asthma, uncomplicated: Secondary | ICD-10-CM | POA: Insufficient documentation

## 2015-04-27 NOTE — Progress Notes (Signed)
See dictated note.

## 2015-04-28 NOTE — Progress Notes (Signed)
[facility]                              DEPARTMENT OF MEDICINE                                [place], [place] [phone]                                PATIENT NAME: [name] FinerLLEN, Seaborn A  HOSPITAL GNFAOZ:[id number]:[id number]  DATE OF SERVICE:04/27/2015  DATE OF BIRTH:     PROGRESS NOTE    PRIMARY CARE PROVIDER:  Randol Kernarrie Leach, PA-C.    SUBJECTIVE:  Mr. Shyrl Numbersodd Rauda is a very nice 49 year old man who I have followed for coronary artery disease.  He [facility] in the distant past.  He most recently underwent angioplasty in April 2016 for in-stent restenosis, and again in October 2016 with intravascular ultrasound to optimize his stent apposition.  He also had 1 episode of chest pain after that recath 2 weeks later with patent stents in LAD that looked good.  Today, he states he is doing well.  No chest pain.  No shortness of breath.  He is tolerating his medicines.  He thinks the Norvasc has helped him.    MEDICATION LIST:  1.  Albuterol.  2.  Norvasc 2.5 mg daily.  3.  Aspirin 81 mg daily.  4.  Lipitor 20 mg daily.  5.  Vitamin D.  6.  Plavix 75 mg daily.  7.  Cozaar 50 mg daily.  8.  Metoprolol 12.5 mg b.i.d.  9.  Singulair.  10.  Nitroglycerin.  11.  Ranitidine.    OBJECTIVE:  Blood pressure is 122/78.  Pulse is 103 and on time of exam was 90.  Height is 6 foot 4 inches.  Weight is 285 pounds.  Oxygen saturation is 95% on room air.  General:  No acute distress, pleasant man.  Cardiac exam is regular rate and rhythm, normal S1 and S2 with no significant murmurs.  Lungs are clear to auscultation bilaterally.  No wheezes, rales or rhonchi.  Abdomen is soft, nontender.  Normal abdominal bowel sounds.  Extremities revealed no edema in bilateral lower extremities.    LABORATORY DATA AND TESTS:  Cardiac catheterization October 2016, balloon angioplasty to the LAD ,repeat catheterization, wide open LAD.    ASSESSMENT:  1.  Coronary artery  disease, status post percutaneous transluminal coronary angioplasty (PTCA) October 2016, PCI to the LAD last stenting was August 2012.    2.  Dyspnea on exertion with asthma.    3.  Hypertension.    4.  Dyslipidemia.    RECOMMENDATIONS:  1.  The patient is able to return to work on April 29, 2015.    2.  Continue above medications the same.    3.  Follow up in 6 months or sooner if needed.      Jodi GeraldsAnthony Roda-Renzelli, MD  Assistant Professor  Interventional Cardiology   Department of Medicine    [id number]; D: 04/27/2015 11:58:56; T: 04/28/2015 07:08:14    cc: Randol Kernarrie Leach PA-C      [facility] [address]      [place], [place] [id number]

## 2015-05-01 ENCOUNTER — Telehealth (HOSPITAL_BASED_OUTPATIENT_CLINIC_OR_DEPARTMENT_OTHER): Payer: Self-pay | Admitting: Internal Medicine

## 2015-05-01 DIAGNOSIS — Z024 Encounter for examination for driving license: Secondary | ICD-10-CM

## 2015-05-21 NOTE — Telephone Encounter (Signed)
    Renzelli pt   Received: 3 weeks ago     Christin Bachaige, [name]  P Card Nurses             Documentation      Summary: Renzelli pt      >> [name] 04/29/2015 02:19 PM  Renzelli pt     Last appt. 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                  Call History         Type Contact Phone User     04/29/2015 02:17 PM Phone (Incoming) Shyrl [name], [name] [name] (Self) [phone] [name], [name]         Spoke with pt on day of call. He stated that he is being required to have [name] stress test and will not be able to return to work without it. After many calls to pt's employer, was able to speak with [name] physician for the company. He stated that pt will not need the stress test, especially since pt recently had cath with normal heart pump function readings. He stated that he will take care of getting thsi pt back to work.

## 2015-07-14 ENCOUNTER — Encounter (HOSPITAL_BASED_OUTPATIENT_CLINIC_OR_DEPARTMENT_OTHER): Payer: Self-pay | Admitting: Physician Assistant

## 2015-07-14 DIAGNOSIS — I2510 Atherosclerotic heart disease of native coronary artery without angina pectoris: Secondary | ICD-10-CM

## 2015-07-20 ENCOUNTER — Encounter (HOSPITAL_BASED_OUTPATIENT_CLINIC_OR_DEPARTMENT_OTHER): Payer: Self-pay

## 2015-11-06 ENCOUNTER — Encounter (HOSPITAL_COMMUNITY): Payer: Self-pay | Admitting: Internal Medicine

## 2015-11-19 ENCOUNTER — Encounter (HOSPITAL_COMMUNITY): Payer: Self-pay | Admitting: Internal Medicine

## 2015-11-19 ENCOUNTER — Ambulatory Visit: Payer: MEDICAID | Attending: Internal Medicine | Admitting: Internal Medicine

## 2015-11-19 VITALS — BP 138/90 | HR 76 | Ht 75.0 in | Wt 278.9 lb

## 2015-11-19 DIAGNOSIS — Z79899 Other long term (current) drug therapy: Secondary | ICD-10-CM | POA: Insufficient documentation

## 2015-11-19 DIAGNOSIS — E785 Hyperlipidemia, unspecified: Secondary | ICD-10-CM | POA: Insufficient documentation

## 2015-11-19 DIAGNOSIS — Z7902 Long term (current) use of antithrombotics/antiplatelets: Secondary | ICD-10-CM | POA: Insufficient documentation

## 2015-11-19 DIAGNOSIS — Z7982 Long term (current) use of aspirin: Secondary | ICD-10-CM | POA: Insufficient documentation

## 2015-11-19 DIAGNOSIS — Z955 Presence of coronary angioplasty implant and graft: Secondary | ICD-10-CM | POA: Insufficient documentation

## 2015-11-19 DIAGNOSIS — I2510 Atherosclerotic heart disease of native coronary artery without angina pectoris: Secondary | ICD-10-CM | POA: Insufficient documentation

## 2015-11-19 DIAGNOSIS — I10 Essential (primary) hypertension: Secondary | ICD-10-CM | POA: Insufficient documentation

## 2015-11-19 MED ORDER — AMLODIPINE 2.5 MG TABLET
2.5000 mg | ORAL_TABLET | Freq: Every day | ORAL | 4 refills | Status: DC
Start: 2015-11-19 — End: 2016-04-22

## 2015-11-19 MED ORDER — CLOPIDOGREL 75 MG TABLET
75.0000 mg | ORAL_TABLET | Freq: Every day | ORAL | 3 refills | Status: DC
Start: 2015-11-19 — End: 2016-05-30

## 2015-11-19 MED ORDER — LOSARTAN 50 MG TABLET
50.0000 mg | ORAL_TABLET | Freq: Every day | ORAL | 3 refills | Status: DC
Start: 2015-11-19 — End: 2016-06-10

## 2015-11-19 MED ORDER — ASPIRIN 325 MG TABLET
81.0000 mg | ORAL_TABLET | Freq: Every day | ORAL | 11 refills | Status: DC
Start: 2015-11-19 — End: 2016-04-22

## 2015-11-19 MED ORDER — METOPROLOL TARTRATE 25 MG TABLET
12.5000 mg | ORAL_TABLET | Freq: Two times a day (BID) | ORAL | 3 refills | Status: DC
Start: 2015-11-19 — End: 2016-04-22

## 2015-11-19 MED ORDER — ATORVASTATIN 20 MG TABLET
20.0000 mg | ORAL_TABLET | Freq: Every evening | ORAL | 3 refills | Status: DC
Start: 2015-11-19 — End: 2018-04-27

## 2015-11-19 NOTE — Progress Notes (Signed)
See dictated note.

## 2015-11-19 NOTE — Progress Notes (Signed)
[facility]                              DEPARTMENT OF MEDICINE                                [place], [place] [id number]                                PATIENT NAME: [name] [name]  HOSPITAL TDVVOH:[id number]:[id number]  DATE OF SERVICE:11/19/2015  DATE OF BIRTH:     PROGRESS NOTE    PRIMARY CARE PROVIDER:  Randol Kernarrie Leach, PA-C.    PRIMARY CARDIOLOGIST:  [name] GeraldsAnthony Roda-Renzelli, MD.    SUBJECTIVE:  [name] [name] is [name] 50 year old white male gentleman with [name] known history of coronary artery disease status post PCI to his LAD back in August 2012.  The patient was then reevaluated by coronary angiography in April 2016 where he underwent successful PTCA for in-stent restenosis.  The patient underwent repeat cardiac catheterization in October 2016 with IVUS showing [name] widely patent stent.  The patient comes in today for followup.    The patient states he is doing well.  He feels good.  He is able to do everything he wants to do without any symptoms.  He denies any chest pain or exertional chest pain.  He denies any lightheadedness or dizziness.  He denies any palpitations or fluttering.  He denies any cough or shortness of breath.  He is currently working for [employer].  He clinically remains grossly stable.    PHYSICAL EXAMINATION:  [name] very pleasant white male gentleman, afebrile and hemodynamically stable.  Vitals are recorded in the chart.  HEENT is unremarkable.  Neck:  Supple.  Carotids equal bilaterally without bruit.  Heart:  Regular rate and rhythm without murmur.  Lungs:  Clear bilaterally.  Abdomen:  Round, soft and bowel sounds are positive.  Extremities:  Without edema.  Skin:  Without rash.    MEDICATIONS:  Current Outpatient Prescriptions   Medication Sig    ALBUTEROL SULFATE (PROAIR HFA INHL) Take 1-2 Puffs by inhalation Every 6 hours as needed     amLODIPine (NORVASC) 2.5 mg Oral Tablet Take 1 Tab (2.5 mg total) by mouth Once [name] day    aspirin 325 mg Oral Tablet Take  0.25 Tabs (81 mg total) by mouth Once [name] day Take 81 mg by mouth Once [name] day    atorvastatin (LIPITOR) 20 mg Oral Tablet Take 1 Tab (20 mg total) by mouth Every evening    cholecalciferol, vitamin D3, 1,000 unit Oral Tablet Take 1,000 Units by mouth Once [name] day    clopidogrel (PLAVIX) 75 mg Oral Tablet Take 1 Tab (75 mg total) by mouth Once [name] day    fluticasone (FLONASE) 50 mcg/actuation Nasal Spray, Suspension 1 Spray by Each Nostril route Once [name] day    fluticasone-vilanterol 100-25 mcg/dose Inhalation Disk with Device Take 1 INHALATION by inhalation Once [name] day     ipratropium-albuterol 0.5 mg-3 mg(2.5 mg base)/3 mL Solution for Nebulization 3 mL by Nebulization route Four times [name] day    losartan (COZAAR) 50 mg Oral Tablet Take 1 Tab (50 mg total) by mouth Once [name] day    metoprolol (LOPRESSOR) 25 mg Oral Tablet Take 0.5 Tabs (12.5 mg total) by mouth Twice  daily    montelukast (SINGULAIR) 10 mg Oral Tablet Take 10 mg by mouth Every evening    nitroglycerin (NITROSTAT) 0.4 mg Sublingual Tablet, Sublingual 0.4 mg by Sublingual route Every 5 minutes as needed for Chest pain for 3 doses over 15 minutes    ranitidine (ZANTAC) 150 mg Oral Tablet Take 150 mg by mouth Twice daily         LABORATORY DATA:  Lab values are pending include lipids and LFTs.    ASSESSMENT AND PLAN:  1.  Coronary artery disease status post PCI to his LAD in August 2012, done at [facility].  The patient underwent PTCA to this area for in-stent restenosis in April 2016.  Repeat cardiac catheterization in October showed it to be widely patent.  He currently remains on aspirin, Plavix and Lopressor.  He remains asymptomatic and is doing well.  2.  Hypertension.  Slightly elevated here in the office today.  He remains on Norvasc, Lopressor and Cozaar.  The patient is intolerant of ASA secondary to cough.  3.  Hyperlipidemia.  The patient remains on Lipitor.  We will check fasting lipid profile with liver function tests.      [name] Dolphin, PA-C  Chief Advanced Practice Professional  [facility] Department of Medicine, Section of Cardiology    [name] Geralds, MD  Assistant Professor  Interventional Cardiology  [facility] Department of Medicine    Late entry for 11/19/15. I personally saw and evaluated the patient. See mid-level's note for additional details. My findings/participation are stable CAD    [name] Geralds, MD      ZO/[id number]; D: 11/19/2015 81:19:14; T: 11/19/2015 23:12:01    cc: [name] Geralds MD      [name] Riches PA-C      [facility] [address]      [place], [place] [address]

## 2016-04-22 ENCOUNTER — Encounter (HOSPITAL_COMMUNITY): Payer: Self-pay

## 2016-04-22 ENCOUNTER — Emergency Department (EMERGENCY_DEPARTMENT_HOSPITAL): Payer: No Typology Code available for payment source

## 2016-04-22 ENCOUNTER — Observation Stay (HOSPITAL_BASED_OUTPATIENT_CLINIC_OR_DEPARTMENT_OTHER): Payer: No Typology Code available for payment source

## 2016-04-22 ENCOUNTER — Observation Stay (HOSPITAL_BASED_OUTPATIENT_CLINIC_OR_DEPARTMENT_OTHER): Payer: No Typology Code available for payment source | Admitting: Internal Medicine

## 2016-04-22 ENCOUNTER — Emergency Department (HOSPITAL_COMMUNITY): Payer: No Typology Code available for payment source

## 2016-04-22 ENCOUNTER — Emergency Department: Payer: No Typology Code available for payment source

## 2016-04-22 ENCOUNTER — Observation Stay
Admission: EM | Admit: 2016-04-22 | Discharge: 2016-04-22 | Disposition: A | Discharge status: Home / Self Care | Payer: No Typology Code available for payment source | Attending: Internal Medicine | Admitting: Internal Medicine

## 2016-04-22 DIAGNOSIS — R079 Chest pain, unspecified: Secondary | ICD-10-CM

## 2016-04-22 DIAGNOSIS — R05 Cough: Secondary | ICD-10-CM

## 2016-04-22 DIAGNOSIS — I25110 Atherosclerotic heart disease of native coronary artery with unstable angina pectoris: Principal | ICD-10-CM | POA: Insufficient documentation

## 2016-04-22 DIAGNOSIS — E785 Hyperlipidemia, unspecified: Secondary | ICD-10-CM

## 2016-04-22 DIAGNOSIS — Z7902 Long term (current) use of antithrombotics/antiplatelets: Secondary | ICD-10-CM | POA: Insufficient documentation

## 2016-04-22 DIAGNOSIS — R9431 Abnormal electrocardiogram [ECG] [EKG]: Secondary | ICD-10-CM

## 2016-04-22 DIAGNOSIS — J45909 Unspecified asthma, uncomplicated: Secondary | ICD-10-CM

## 2016-04-22 DIAGNOSIS — I10 Essential (primary) hypertension: Secondary | ICD-10-CM

## 2016-04-22 DIAGNOSIS — R0602 Shortness of breath: Secondary | ICD-10-CM

## 2016-04-22 DIAGNOSIS — Z87891 Personal history of nicotine dependence: Secondary | ICD-10-CM | POA: Insufficient documentation

## 2016-04-22 DIAGNOSIS — Z79899 Other long term (current) drug therapy: Secondary | ICD-10-CM | POA: Insufficient documentation

## 2016-04-22 DIAGNOSIS — K219 Gastro-esophageal reflux disease without esophagitis: Secondary | ICD-10-CM | POA: Insufficient documentation

## 2016-04-22 DIAGNOSIS — Z7982 Long term (current) use of aspirin: Secondary | ICD-10-CM | POA: Insufficient documentation

## 2016-04-22 DIAGNOSIS — Z8249 Family history of ischemic heart disease and other diseases of the circulatory system: Secondary | ICD-10-CM | POA: Insufficient documentation

## 2016-04-22 DIAGNOSIS — Z955 Presence of coronary angioplasty implant and graft: Secondary | ICD-10-CM | POA: Insufficient documentation

## 2016-04-22 LAB — ECG 12-LEAD
Atrial Rate: 92 {beats}/min
Calculated P Axis: 20 degrees
Calculated T Axis: 36 degrees
Calculated T Axis: 36 degrees
PR Interval: 168 ms
QRS Duration: 92 ms
QT Interval: 340 ms
QTC Calculation: 420 ms
Ventricular rate: 92 {beats}/min

## 2016-04-22 LAB — LIPID PANEL
CHOL/HDL RATIO: 3.3
CHOLESTEROL: 114 mg/dL (ref <200)
HDL CHOL: 35 mg/dL — ABNORMAL LOW (ref >=39)
HDL CHOL: 35 mg/dL — ABNORMAL LOW (ref >=39)
LDL CALC: 61 mg/dL (ref <=100)
NON-HDL: 79 mg/dL (ref <=190)
TRIGLYCERIDES: 90 mg/dL (ref <=150)
VLDL CALC: 18 mg/dL (ref <30)
VLDL CALC: 18 mg/dL (ref <30)

## 2016-04-22 LAB — CBC WITH DIFF
BASOPHIL #: 0.05 x10ˆ3/uL (ref 0.00–0.20)
BASOPHIL #: 0.04 x10ˆ3/uL (ref 0.00–0.20)
BASOPHIL %: 0 %
BASOPHIL %: 0 %
EOSINOPHIL #: 0.19 x10ˆ3/uL (ref 0.00–0.50)
EOSINOPHIL #: 0.21 x10ˆ3/uL (ref 0.00–0.50)
EOSINOPHIL %: 2 %
EOSINOPHIL %: 2 %
HCT: 46.9 % (ref 36.7–47.0)
HCT: 43.3 % (ref 36.7–47.0)
HGB: 16.5 g/dL — ABNORMAL HIGH (ref 12.5–16.3)
HGB: 15.1 g/dL (ref 12.5–16.3)
LYMPHOCYTE #: 1.75 x10ˆ3/uL (ref 1.00–4.80)
LYMPHOCYTE #: 1.86 x10ˆ3/uL (ref 1.00–4.80)
LYMPHOCYTE %: 14 %
LYMPHOCYTE %: 17 %
MCH: 29.8 pg (ref 27.4–33.0)
MCH: 29.8 pg (ref 27.4–33.0)
MCHC: 35.2 g/dL (ref 32.5–35.8)
MCHC: 35 g/dL (ref 32.5–35.8)
MCV: 84.7 fL (ref 78.0–100.0)
MCV: 85.3 fL (ref 78.0–100.0)
MONOCYTE #: 1.36 x10ˆ3/uL — ABNORMAL HIGH (ref 0.30–1.00)
MONOCYTE #: 1.34 10*3/uL — ABNORMAL HIGH (ref 0.30–1.00)
MONOCYTE %: 11 %
MONOCYTE %: 12 %
MPV: 8.8 fL (ref 7.5–11.5)
MPV: 8.8 fL (ref 7.5–11.5)
NEUTROPHIL #: 8.88 x10ˆ3/uL — ABNORMAL HIGH (ref 1.50–7.70)
NEUTROPHIL #: 7.85 10*3/uL — ABNORMAL HIGH (ref 1.50–7.70)
NEUTROPHIL %: 73 %
NEUTROPHIL %: 70 %
PLATELETS: 225 x10?3/uL (ref 140–450)
PLATELETS: 217 x10ˆ3/uL (ref 140–450)
RBC: 5.53 x10ˆ6/uL (ref 4.06–5.63)
RBC: 5.08 x10ˆ6/uL (ref 4.06–5.63)
RDW: 12.5 % (ref 12.0–15.0)
RDW: 12.7 % (ref 12.0–15.0)
WBC: 12.2 x10ˆ3/uL — ABNORMAL HIGH (ref 3.5–11.0)
WBC: 11.3 10*3/uL — ABNORMAL HIGH (ref 3.5–11.0)

## 2016-04-22 LAB — LIGHT GREEN TOP TUBE

## 2016-04-22 LAB — VENOUS BLOOD GAS/LACTATE
%FIO2 (VENOUS): 21 %
BASE EXCESS: 0.3 mmol/L (ref ?–3.0)
BICARBONATE (VENOUS): 24 mmol/L (ref 22.0–26.0)
LACTATE: 1.6 mmol/L — ABNORMAL HIGH (ref 0.0–1.3)

## 2016-04-22 LAB — TROPONIN-I (FOR ED ONLY): TROPONIN I: <7 ng/L (ref 0–30)

## 2016-04-22 LAB — DRUG SCREEN, HIGH OPIATE CUTOFF, NO CONFIRMATION, URINE
AMPHETAMINES URINE: NEGATIVE
BARBITURATES URINE: NEGATIVE
BENZODIAZEPINES URINE: NEGATIVE
BUPRENORPHINE URINE: NEGATIVE
CANNABINOIDS URINE: NEGATIVE
COCAINE METABOLITES URINE: NEGATIVE
CREATININE RANDOM URINE: 273 mg/dL
ECSTASY/MDMA URINE: NEGATIVE
METHADONE URINE: NEGATIVE
OPIATES URINE (HIGH CUTOFF): NEGATIVE
OXYCODONE URINE: NEGATIVE

## 2016-04-22 LAB — MAGNESIUM: MAGNESIUM: 2 mg/dL (ref 1.6–2.5)

## 2016-04-22 LAB — BASIC METABOLIC PANEL
ANION GAP: 8 mmol/L (ref 4–13)
BUN/CREA RATIO: 10 (ref 6–22)
BUN: 11 mg/dL (ref 8–25)
BUN: 11 mg/dL (ref 8–25)
CALCIUM: 9.2 mg/dL (ref 8.5–10.2)
CHLORIDE: 105 mmol/L (ref 96–111)
CO2 TOTAL: 25 mmol/L (ref 22–32)
CREATININE: 1.08 mg/dL (ref 0.62–1.27)
ESTIMATED GFR: >59 mL/min/1.73mˆ2 (ref >59)
GLUCOSE: 100 mg/dL (ref 65–139)
POTASSIUM: 3.8 mmol/L (ref 3.5–5.1)
SODIUM: 138 mmol/L (ref 136–145)

## 2016-04-22 LAB — HGA1C (HEMOGLOBIN A1C WITH EST AVG GLUCOSE)
ESTIMATED AVERAGE GLUCOSE: 123 mg/dL
HEMOGLOBIN A1C: 5.9 % (ref 4.0–6.0)

## 2016-04-22 LAB — PT/INR
INR: 0.96 (ref 0.80–1.20)
PROTHROMBIN TIME: 10.8 s (ref 9.0–13.6)

## 2016-04-22 LAB — PTT (PARTIAL THROMBOPLASTIN TIME): APTT: 31.4 s (ref 25.1–36.5)

## 2016-04-22 LAB — B-TYPE NATRIURETIC PEPTIDE (BNP),PLASMA: BNP: <10 pg/mL (ref <=100)

## 2016-04-22 LAB — TROPONIN-I: TROPONIN I: <7 ng/L (ref 0–30)

## 2016-04-22 LAB — LEGIONELLA URINE ANTIGEN: LEGIONELLA ANTIGEN: NEGATIVE

## 2016-04-22 LAB — PHOSPHORUS: PHOSPHORUS: 2.5 mg/dL (ref 2.4–4.7)

## 2016-04-22 LAB — STREPTOCOCCUS PNEUMONIAE ANTIGEN,URINE: S.PNEUMONIA ANTIGEN: NEGATIVE

## 2016-04-22 MED ORDER — ASPIRIN 81 MG CHEWABLE TABLET
81.0000 mg | CHEWABLE_TABLET | Freq: Every day | ORAL | 0 refills | Status: AC
Start: 2016-04-22 — End: 2016-05-22

## 2016-04-22 MED ORDER — METOPROLOL TARTRATE 12.5 MG HALF TAB
12.50 mg | ORAL_TABLET | Freq: Two times a day (BID) | ORAL | Status: DC
Start: 2016-04-22 — End: 2016-04-22
  Administered 2016-04-22: 0 mg via ORAL
  Filled 2016-04-22 (×2): qty 1

## 2016-04-22 MED ORDER — ALBUTEROL SULFATE HFA 90 MCG/ACTUATION AEROSOL INHALER
1.00 | INHALATION_SPRAY | Freq: Four times a day (QID) | RESPIRATORY_TRACT | Status: DC | PRN
Start: 2016-04-22 — End: 2016-04-22

## 2016-04-22 MED ORDER — FLUTICASONE PROPIONATE 50 MCG/ACTUATION NASAL SPRAY,SUSPENSION
1.0000 | Freq: Every day | NASAL | Status: DC
Start: 2016-04-22 — End: 2016-04-22
  Administered 2016-04-22: 0 via NASAL
  Filled 2016-04-22: qty 16

## 2016-04-22 MED ORDER — METOPROLOL TARTRATE 25 MG TABLET
25.00 mg | ORAL_TABLET | Freq: Two times a day (BID) | ORAL | 0 refills | Status: DC
Start: 2016-04-22 — End: 2016-09-16

## 2016-04-22 MED ORDER — SODIUM CHLORIDE 0.9 % (FLUSH) INJECTION SYRINGE
2.0000 mL | INJECTION | INTRAMUSCULAR | Status: DC | PRN
Start: 2016-04-22 — End: 2016-04-22

## 2016-04-22 MED ORDER — AMLODIPINE 2.5 MG TABLET
2.5000 mg | ORAL_TABLET | Freq: Every day | ORAL | Status: DC
Start: 2016-04-22 — End: 2016-04-22
  Administered 2016-04-22: 0 mg via ORAL
  Filled 2016-04-22: qty 1

## 2016-04-22 MED ORDER — MONTELUKAST 10 MG TABLET
10.00 mg | ORAL_TABLET | Freq: Every evening | ORAL | Status: DC
Start: 2016-04-22 — End: 2016-04-22
  Filled 2016-04-22: qty 1

## 2016-04-22 MED ORDER — CLOPIDOGREL 75 MG TABLET
75.0000 mg | ORAL_TABLET | Freq: Every day | ORAL | Status: DC
Start: 2016-04-22 — End: 2016-04-22
  Administered 2016-04-22: 0 mg via ORAL
  Filled 2016-04-22: qty 1

## 2016-04-22 MED ORDER — AMLODIPINE 5 MG TABLET
5.0000 mg | ORAL_TABLET | Freq: Every day | ORAL | 0 refills | Status: DC
Start: 2016-04-23 — End: 2016-06-10

## 2016-04-22 MED ORDER — FAMOTIDINE 20 MG TABLET
20.0000 mg | ORAL_TABLET | Freq: Two times a day (BID) | ORAL | Status: DC
Start: 2016-04-22 — End: 2016-04-22

## 2016-04-22 MED ORDER — IPRATROPIUM 0.5 MG-ALBUTEROL 3 MG (2.5 MG BASE)/3 ML NEBULIZATION SOLN
3.00 mL | INHALATION_SOLUTION | Freq: Four times a day (QID) | RESPIRATORY_TRACT | Status: DC
Start: 2016-04-22 — End: 2016-04-22
  Administered 2016-04-22: 0 mL via RESPIRATORY_TRACT

## 2016-04-22 MED ORDER — FLU VACCINE QS2017-18(36 MOS UP)(PF)60 MCG(15 MCGX4)/0.5 ML IM SYRINGE
0.5000 mL | INJECTION | Freq: Once | INTRAMUSCULAR | Status: DC
Start: 2016-04-22 — End: 2016-04-22
  Administered 2016-04-22: 0 mL via INTRAMUSCULAR
  Filled 2016-04-22: qty 0.5

## 2016-04-22 MED ORDER — ENOXAPARIN 40 MG/0.4 ML SUBCUTANEOUS SYRINGE
40.0000 mg | INJECTION | SUBCUTANEOUS | Status: DC
Start: 2016-04-22 — End: 2016-04-22
  Administered 2016-04-22: 0 mg via SUBCUTANEOUS
  Filled 2016-04-22: qty 0

## 2016-04-22 MED ORDER — ASPIRIN 81 MG CHEWABLE TABLET
81.0000 mg | CHEWABLE_TABLET | Freq: Every day | ORAL | Status: DC
Start: 2016-04-22 — End: 2016-04-22
  Administered 2016-04-22: 0 mg via ORAL
  Filled 2016-04-22: qty 1

## 2016-04-22 MED ORDER — NITROGLYCERIN 0.4 MG SUBLINGUAL TABLET
0.40 mg | SUBLINGUAL_TABLET | SUBLINGUAL | Status: DC | PRN
Start: 2016-04-22 — End: 2016-04-22

## 2016-04-22 MED ORDER — METOPROLOL TARTRATE 25 MG TABLET
25.00 mg | ORAL_TABLET | Freq: Two times a day (BID) | ORAL | Status: DC
Start: 2016-04-22 — End: 2016-04-22
  Filled 2016-04-22: qty 1

## 2016-04-22 MED ORDER — REGADENOSON 0.4 MG/5 ML INTRAVENOUS SYRINGE
0.40 mg | INJECTION | INTRAVENOUS | Status: AC
Start: 2016-04-22 — End: 2016-04-22
  Administered 2016-04-22: 0.4 mg via INTRAVENOUS

## 2016-04-22 MED ORDER — SODIUM CHLORIDE 0.9 % (FLUSH) INJECTION SYRINGE
2.00 mL | INJECTION | Freq: Three times a day (TID) | INTRAMUSCULAR | Status: DC
Start: 2016-04-22 — End: 2016-04-22
  Administered 2016-04-22: 0 mL

## 2016-04-22 MED ORDER — AMLODIPINE 5 MG TABLET
5.0000 mg | ORAL_TABLET | Freq: Every day | ORAL | Status: DC
Start: 2016-04-23 — End: 2016-04-22

## 2016-04-22 MED ORDER — ATORVASTATIN 10 MG TABLET
20.00 mg | ORAL_TABLET | Freq: Every evening | ORAL | Status: DC
Start: 2016-04-22 — End: 2016-04-22
  Filled 2016-04-22: qty 2

## 2016-04-22 MED ORDER — SODIUM CHLORIDE 0.9 % (FLUSH) INJECTION SYRINGE
2.0000 mL | INJECTION | Freq: Three times a day (TID) | INTRAMUSCULAR | Status: DC
Start: 2016-04-22 — End: 2016-04-22
  Administered 2016-04-22: 0 mL

## 2016-04-22 MED ORDER — LOSARTAN 50 MG TABLET
50.0000 mg | ORAL_TABLET | Freq: Every day | ORAL | Status: DC
Start: 2016-04-22 — End: 2016-04-22
  Administered 2016-04-22: 0 mg via ORAL
  Filled 2016-04-22: qty 1

## 2016-04-22 MED ORDER — SODIUM CHLORIDE 0.9 % (FLUSH) INJECTION SYRINGE
2.00 mL | INJECTION | INTRAMUSCULAR | Status: DC | PRN
Start: 2016-04-22 — End: 2016-04-22

## 2016-04-22 MED ORDER — DEXTROMETHORPHAN-GUAIFENESIN 10 MG-100 MG/5 ML ORAL SYRUP
10.00 mL | ORAL_SOLUTION | Freq: Once | ORAL | Status: AC
Start: 2016-04-22 — End: 2016-04-22
  Administered 2016-04-22: 10 mL via ORAL
  Filled 2016-04-22: qty 10

## 2016-04-22 MED ADMIN — electrolyte-A intravenous solution: SUBCUTANEOUS | @ 14:00:00 | NDC 00338022104

## 2016-04-22 MED ADMIN — losartan 50 mg tablet: ORAL | @ 14:00:00

## 2016-04-22 MED ADMIN — nystatin 100,000 unit/gram topical cream: ORAL | @ 14:00:00 | NDC 51672128901

## 2016-04-22 MED ADMIN — lactated Ringers intravenous solution: INTRAMUSCULAR | @ 14:00:00

## 2016-04-22 NOTE — ED Resident Handoff Note (Signed)
Emergency Department  Resident Course Note    Patient Name: [name] [name]  Age and Gender: 50 y.o. male  Date of Birth:   Date of Service: 04/22/2016  MRN: [id number]  PCP: Randol Kernarrie Leach, PA-C  Attending: Serina CowperBlum, Frederick, MD      Allergies   Allergen Reactions   . Phenergan [Promethazine] Seizure       Vitals:    04/22/16 0459   BP: 130/78   Pulse: 96   Resp: 20   Temp: 36.8 C (98.2 F)   SpO2: 95%   Weight: 122.9 kg (270 lb 15.1 oz)   Height: 1.905 m (6\' 3" )       HPI:  In brief, patient is a 50 y.o. malewho presents to the emergency department due to:  Chest pain, typical since 2AM this morning. Hx PCI and cardiac hx. Med hosp called. Trops negative.    Pertinent Exam Findings:  All WNL    Pertinent Imaging/Lab results:  Labs Reviewed   CBC WITH DIFF - Abnormal; Notable for the following:        Result Value    WBC 12.2 (*)     HGB 16.5 (*)     NEUTROPHIL # 8.88 (*)     MONOCYTE # 1.36 (*)     All other components within normal limits   VENOUS BLOOD GAS/LACTATE - Abnormal; Notable for the following:     PH 7.42 (*)     PCO2 38.00 (*)     PO2 28.0 (*)     LACTATE 1.6 (*)     All other components within normal limits   BASIC METABOLIC PANEL - Normal   PT/INR - Normal    Narrative:     Coumadin therapy INR range for Conventional Anticoagulation is 2.0 to 3.0 and for Intensive Anticoagulation 2.5 to 3.5.   PTT (PARTIAL THROMBOPLASTIN TIME) - Normal    Narrative:     Therapeutic range for unfractionated heparin is 60.0-100.0 seconds.   TROPONIN-I (FOR ED ONLY) - Normal   HIV1/HIV2 SCREEN, COMBINED ANTIGEN AND ANTIBODY - Normal   B-TYPE NATRIURETIC PEPTIDE - Normal   PHOSPHORUS - Normal   MAGNESIUM - Normal   CBC/DIFF    Narrative:     The following orders were created for panel order CBC/DIFF.  Procedure                               Abnormality         Status                     ---------                               -----------         ------                     CBC WITH [id number]]                 Abnormal            Final result                 Please view results for these tests on the individual orders.   EXTRA TUBES - RUBY ONLY    Narrative:     The following orders were created for panel order  EXTRA TUBES - RUBY ONLY.  Procedure                               Abnormality         Status                     ---------                               -----------         ------                     LIGHT GREEN TOP [id number][[id number]]                             In process                   Please view results for these tests on the individual orders.   LIGHT GREEN TOP TUBE   TROPONIN-I   TROPONIN-I   TROPONIN-I            Pending Studies:  Troponin trend    Consults:  Medicine    Plan:  Patient to be admitted to Medicine. Please see admitting service plan.  Results up to the Time the Disposition was Entered   CBC WITH DIFF - Abnormal; Notable for the following:        Result Value    WBC 12.2 (*)     HGB 16.5 (*)     NEUTROPHIL # 8.88 (*)     MONOCYTE # 1.36 (*)     All other components within normal limits   VENOUS BLOOD GAS/LACTATE - Abnormal; Notable for the following:     PH 7.42 (*)     PCO2 38.00 (*)     PO2 28.0 (*)     LACTATE 1.6 (*)     All other components within normal limits   BASIC METABOLIC PANEL - Normal   PT/INR - Normal    Narrative:     Coumadin therapy INR range for Conventional Anticoagulation is 2.0 to 3.0 and for Intensive Anticoagulation 2.5 to 3.5.   PTT (PARTIAL THROMBOPLASTIN TIME) - Normal    Narrative:     Therapeutic range for unfractionated heparin is 60.0-100.0 seconds.   TROPONIN-I (FOR ED ONLY) - Normal   HIV1/HIV2 SCREEN, COMBINED ANTIGEN AND ANTIBODY - Normal   B-TYPE NATRIURETIC PEPTIDE - Normal   PHOSPHORUS - Normal   MAGNESIUM - Normal   ECG 12-LEAD   CBC/DIFF    Narrative:     The following orders were created for panel order CBC/DIFF.  Procedure                               Abnormality         Status                     ---------                               -----------          ------  CBC WITH ZOXW[[id number]]                Abnormal            Final result                 Please view results for these tests on the individual orders.   INSERT & MAINTAIN PERIPHERAL IV ACCESS   PULSE OXIMETRY   CONTINUOUS CARDIAC MONITORING (ED USE ONLY)   EXTRA TUBES - RUBY ONLY    Narrative:     The following orders were created for panel order EXTRA TUBES - RUBY ONLY.  Procedure                               Abnormality         Status                     ---------                               -----------         ------                     LIGHT GREEN TOP [id number]]                             In process                   Please view results for these tests on the individual orders.   LIGHT GREEN TOP TUBE   TROPONIN-I   TROPONIN-I   TROPONIN-I   NS flush syringe (not administered)     And   NS flush syringe (not administered)   dextromethorphan-guaiFENesin (ROBITUSSIN DM) 10-100mg  per 5mL oral liquid (10 mL Oral Given 04/22/16 0629)       After a thorough discussion of the patient including presentation, ED course, and review of above information I have assumed care of [name] Finer from Dr. Georges Mouse at 06:48    Course:  Admit to Med Hospitalist 8    Blanchard Mane, MD  04/22/2016, 06:48  PGY-1 Transitional Year  [facility]

## 2016-04-22 NOTE — Nurses Notes (Signed)
Pt. arrived from ED > Nuclear Medicine to 9SE16. Patient setup & oriented to room and educated on how to use call bell. Currently observation status. VSS. Belongings sheet checked by CA. Transfer arrival completed and orders released. Will continue to monitor.

## 2016-04-22 NOTE — Discharge Summary (Signed)
DISCHARGE SUMMARY      PATIENT NAME:  [name] [name],[name] [name]  MRN:  [id number]  DOB:      ADMISSION DATE:  04/22/2016  DISCHARGE DATE:  04/22/2016    ATTENDING PHYSICIAN: Leonard Schwartzarducci, Hugo Calvin, DO  SERVICE: MED HOSPITALIST 8 CARDIAC REFERENCE UNIT  PRIMARY CARE PHYSICIAN: Randol KernCarrie Leach, PA-C     Reason for Admission     Diagnosis    Chest pain [125822]          DISCHARGE DIAGNOSIS:     Principal Problem:  Chest pain    Active Hospital Problems    Diagnosis Date Noted    Principle Problem: Chest pain 04/22/2016      Resolved Hospital Problems    Diagnosis    No resolved problems to display.     Active Non-Hospital Problems    Diagnosis Date Noted    CAD (coronary artery disease) 01/25/2013      Allergies   Allergen Reactions    Phenergan [Promethazine] Seizure        DISCHARGE MEDICATIONS:     Current Discharge Medication List      START taking these medications.       Details    aspirin 81 mg Tablet, Chewable   Replaces:  aspirin 325 mg Tablet    81 mg, Oral, Daily   Qty:  30 Tab   Refills:  0         CONTINUE these medications which have CHANGED during your visit.       Details    amLODIPine 5 mg Tablet   Commonly known as:  NORVASC   Start taking on:  04/23/2016   What changed:    - medication strength  - how much to take    5 mg, Oral, Daily   Qty:  30 Tab   Refills:  0       metoprolol 25 mg Tablet   Commonly known as:  LOPRESSOR   What changed:  how much to take    25 mg, Oral, 2x/day   Qty:  60 Tab   Refills:  0         CONTINUE these medications - NO CHANGES were made during your visit.       Details    atorvastatin 20 mg Tablet   Commonly known as:  LIPITOR    20 mg, Oral, QPM   Qty:  90 Tab   Refills:  3       clopidogrel 75 mg Tablet   Commonly known as:  PLAVIX    75 mg, Oral, Daily   Qty:  90 Tab   Refills:  3       fluticasone 50 mcg/actuation Spray, Suspension   Commonly known as:  FLONASE    1 Spray, Each Nostril, Daily   Refills:  0       fluticasone-vilanterol 100-25 mcg/dose Disk with Device   Commonly  known as:  BREO ELLIPTA    1 INHALATION, Inhalation, Daily   Refills:  0       ipratropium-albuterol 0.5 mg-3 mg(2.5 mg base)/3 mL Solution for Nebulization   Commonly known as:  DUONEB    3 mL, Nebulization, 4x/day   Refills:  0       losartan 50 mg Tablet   Commonly known as:  COZAAR    50 mg, Oral, Daily   Qty:  90 Tab   Refills:  3       montelukast 10 mg Tablet  Commonly known as:  SINGULAIR    10 mg, Oral, QPM   Refills:  0       nitroGLYCERIN 0.4 mg Tablet, Sublingual   Commonly known as:  NITROSTAT    0.4 mg, Q5 Min PRN   Refills:  0       PROAIR HFA INHL    1-2 Puffs, Inhalation, Q6H PRN   Refills:  0       raNITIdine 150 mg Tablet   Commonly known as:  ZANTAC    150 mg, Oral, 2x/day   Refills:  0         STOP taking these medications.          aspirin 325 mg Tablet   Replaced by:  aspirin 81 mg Tablet, Chewable           Discharge med list refreshed?  YES    During this hospitalization did the patient have an AMI, PCI/PCTA, STENT or Isolated CABG?  No                DISCHARGE INSTRUCTIONS:  Follow-up Information     Follow up with Randol Kern, PA-C.    Specialty:  EXTERNAL    Contact information:    [facility]  [address] [place]  [place] [address]  [phone]          Follow up with [facility] .    Specialty:  Cardiology    Contact information:    [facility] [address]  [place] [address]  [phone]    Additional information:    For driving directions to the [facility] in [place], [place], please call [phone]). You may also visit our website at [email or web address].*Valet parking is available to patients at [facility] for free and tipping is not required. *Visitors to our main campus will Radio producer as we are expanding to better serve you. We apologize for any inconvenience this may cause and appreciate your patience.          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     Unstable Angina with SOB  - MPS Positive. Cardiology suggested increased Norvasc from 2.5 mg daily to 5 mg daily as well as Metoprolol 12.5 mg BID to 25 mg BID. They will see him in two weeks. No Catheterization on this admission.     Dry cough / Chronic Asthma   - streptococcus and legionella antigen negative.    - CXR   - cont home asthma treatment    Hyperlipidemia   - cont statin   - cont lifestyle modification as patient has been doing.   - healthy living program referral     HTN  - cont home antihypertensive meds     CONDITION ON DISCHARGE:  [name]. Ambulation: Full ambulation  B. Self-care Ability: Complete  C. Cognitive Status Alert and  Oriented x 3  D. DNR status at discharge: Full Code    Advance Directive Information       Most Recent Value    Does the Patient have an Advance Directive? Yes, Patient Does Have Advance Directive for Healthcare Treatment    Type of Advance Directive Completed Medical Living Will    Copy of Advance Directives in Chart? No, Copy Requested From Patient          DISCHARGE DISPOSITION:  Home discharge      Leonard SchwartzHugo Calvin Carducci, DO  Assistant Professor   Department of Internal Medicine  [facility]        Copies sent to Care Team       Relationship Specialty Notifications Start End    Randol KernLeach, Carrie, [place] PCP - General EXTERNAL  03/07/13     Phone: [phone] Fax: [phone]         [facility] [address] [place] [id number]          Referring providers can utilize  [email or web address] to access their referred [facility] patient's information.

## 2016-04-22 NOTE — ED Nurses Note (Signed)
Report called to voice care, floor notified.

## 2016-04-22 NOTE — Progress Notes (Signed)
Stress Test Data    04/22/2016     Clinical Problem: Chest Pain    Hemodynamic Data  Supine: HR 85 bpm   BP 142/82    1.  Reason for Termination: Infusion Complete    2.  Significant Symptoms: 02/10 Chest Pain with Infusion    3.  Treadmill Protocol: Lexiscan    4.  Blood Pressure Response: 146/90        Peak Heart Rate: 111 bpm        85% MPHR: 64 bpm    5.  ST Changes: None    6.  Arrhythmias: None    7. Peak Capacity METS: 1.00      Total Minutes: 1:00    8. Med Dosage:       Lexiscan:  0.4 mg    9. Type of Test: Lexiscan    Baseline ECG: NSR    Chronotropic Response: *Normal    Heart Rate Recovery: N/A    Exercise Capacity: N/A    Pre Test Probability: N/A    Post Test Probability: N/A    [facility] Treadmill Score: N/A    Supervising Staff: Thomos LemonsSameer Jones Viviani

## 2016-04-22 NOTE — ED Nurses Note (Signed)
Report given to oncoming RN and care transferred at this time.

## 2016-04-22 NOTE — ED Nurses Note (Signed)
Pt presents to the ED with c/o chest pain that pt states woke him up from sleep at 2am.  Pt states that he has 3 stents from 2011, 2012.  Pt states that he last had angina and heart cath sept 2016.  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  Pt states pain is sternal and left chest radiating to left arm.  Pt c/o diaphoresis, shortness of breath.  Pt states he took breathing treatment which helped for a little, but SOB returned.  Pt states he has been sick x2weeks and had coughs.  Pt states that chest pain feels muscular.

## 2016-04-22 NOTE — ED Attending Note (Signed)
Note begun by:  Serina CowperFrederick Tomoya Ringwald, MD  04/22/2016, 05:54    I was physically present and directly supervised this patient's care.  Patient seen and examined with Dr Georges MouseNaqvi.  Resident history and exam reviewed.   Key elements in addition to and/or correction of that documentation are as follows:    HPI :    50 y.o. male presents with chief complaint of chest pain. Substernal. Rad to L arm. Associated with nausea and diaphoresis. Improved with NTG. CP awakened him from sleep. Pt has known CAD with stents. Followed by cards here.     PE :   VS on presentation: Blood pressure 130/78, pulse 96, temperature 36.8 C (98.2 F), resp. rate 20, height 1.905 m (6\' 3" ), weight 122.9 kg (270 lb 15.1 oz), SpO2 95 %.  I have seen and examined with Dr Georges MouseNaqvi and agree with her findings as presented verbally to me, except as subsquently noted.     Data/Test :    EKG : see ECG  Images Review by me : see list  Image Reports Review by me : As above  Labs :see list    Review of Prior Data :       Prior Images : None  Prior EKG : None  Online Medical Records : [facility]  Transfer Docs/Images : None    Clinical Impression :   1. Chest pain.        ED Course :   Per Dr Georges MouseNaqvi     Plan :   Per Dr Georges MouseNaqvi    Dispo :   Per Dr Georges MouseNaqvi    CRITICAL CARE : None

## 2016-04-22 NOTE — ED Attending Handoff Note (Addendum)
Care of patient assumed from Dr. Obie DredgeBlum at 0700 with admit pending    [name] Fortsosanna Dawn Sharian Delia, MD  04/22/2016, 07:22  Woke up with CP at 0200   has stents   pain got better with NTG   - being admitted    BP 126/76  Pulse 79  Temp 36.8 C (98.2 F)  Resp 16  Ht 1.905 m (6\' 3" )  Wt 122.9 kg (270 lb 15.1 oz)  SpO2 93%  BMI 33.87 kg/m2     Disposition: Admitted    Follow up:   No follow-up provider specified.    Clinical Impression:   No diagnosis found.    Future Appointments scheduled in [facility]:   Future Appointments  Date Time Provider Department Center   04/22/2016 10:35 AM [facility] STRESS LAB [facility]   04/22/2016 12:05 PM [facility]   06/02/2016 2:20 PM Jodi Geraldsoda-Renzelli, Anthony, MD [facility]

## 2016-04-22 NOTE — Nurses Notes (Signed)
Patient discharged to home per MD order. DC instructions given to patient. All medications and future appointments reviewed. Patient verbalizes an understanding and has no questions at this time. PIV's removed. Any education reviewed with patient. Patient stable at time of discharge. Escorted out of building with wife who is also ride home.

## 2016-04-22 NOTE — H&P (Signed)
[facility]  [facility] Reference Unit   Admission H&P    Date of Service:  04/22/2016  [name],[name], 50 y.o. male  Date of Admission:  04/22/2016  Date of Birth:    PCP: Randol Kern, PA-C          Information Obtained from: patient  Chief Complaint:  "I had chest pain and shortness of breath."    HPI: [name] [name] is a 50 y.o., White male  who presents with acute onset of substernal chest pain that radiate to left shoulder and jaw last night around 2:30 a.m. He stated that he was sleeping when the pain came on which was so bad that it woke him up. He also has associated nausea and diaphoresis. He took a nitro pill and the pain went away in about couple minutes. He has chronic exertional SOB d/t asthma, however, he felt worsening of the SOB with the chest pain. 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 He also complaints of dry cough for the past couple weeks. Denies fever or chills, denies lower extremity swelling. Denies hemoptysis. Last similar episode of chest pain was greater than a year ago. He takes his medication as prescribed. He is currently pain free. He is positive for remote smoking and drinking history but quit over than 10 years ago.     PAST MEDICAL:    Past Medical History:   Diagnosis Date    Asthma     Coronary artery disease     CPAP (continuous positive airway pressure) dependence     Esophageal reflux     Hypercholesterolemia     Hypertension     Knee injury     Sleep apnea         Past Surgical History:   Procedure Laterality Date    CORONARY ARTERY ANGIOPLASTY      HX DENTAL EXTRACTION      HX STENTING (ANY)  06/15/2011    HX TONSIL AND ADENOIDECTOMY              Medications Prior to Admission     Prescriptions    ALBUTEROL SULFATE (PROAIR HFA INHL)    Take 1-2 Puffs by inhalation Every 6 hours as needed     amLODIPine (NORVASC) 2.5 mg Oral Tablet    Take 1 Tab (2.5 mg  total) by mouth Once a day    aspirin 325 mg Oral Tablet    Take 0.25 Tabs (81 mg total) by mouth Once a day Take 81 mg by mouth Once a day    atorvastatin (LIPITOR) 20 mg Oral Tablet    Take 1 Tab (20 mg total) by mouth Every evening    clopidogrel (PLAVIX) 75 mg Oral Tablet    Take 1 Tab (75 mg total) by mouth Once a day    fluticasone (FLONASE) 50 mcg/actuation Nasal Spray, Suspension    1 Spray by Each Nostril route Once a day    fluticasone-vilanterol 100-25 mcg/dose Inhalation Disk with Device    Take 1 INHALATION by inhalation Once a day     ipratropium-albuterol 0.5 mg-3 mg(2.5 mg base)/3 mL Solution for Nebulization    3 mL by Nebulization route Four times a day    losartan (COZAAR) 50 mg Oral Tablet    Take 1 Tab (50 mg total) by mouth Once a day    metoprolol (LOPRESSOR) 25 mg  Oral Tablet    Take 0.5 Tabs (12.5 mg total) by mouth Twice daily    montelukast (SINGULAIR) 10 mg Oral Tablet    Take 10 mg by mouth Every evening    nitroglycerin (NITROSTAT) 0.4 mg Sublingual Tablet, Sublingual    0.4 mg by Sublingual route Every 5 minutes as needed for Chest pain for 3 doses over 15 minutes    ranitidine (ZANTAC) 150 mg Oral Tablet    Take 150 mg by mouth Twice daily    cholecalciferol, vitamin D3, 1,000 unit Oral Tablet    Take 1,000 Units by mouth Once a day        Allergies   Allergen Reactions    Phenergan [Promethazine] Seizure       Vaccinations:      Influenza: Will order influenza vaccine.    Family History  Positive for premature family history of coronary artery disease in the mother and father.    Social History  Social History   Substance Use Topics    Smoking status: Former Smoker     Years: 10.00     Quit date: 04/20/1992    Smokeless tobacco: Never Used    Alcohol use No      Review of Systems:    General:  No fevers, chills, night sweats, fatigue, weakness. + intentional weight change  Skin:  No skin, hair, or nail changes. No rashes, itching, sores, or lacerations.  HEENT:  No headache,  tinnitus, nasal congestion, sore throat, or visual changes.  CV: No palpitations. +Chest pain  Pulm: + shortness of breath, cough. No wheezing.  GI: + nausea. No vomiting, diarrhea, melena, hematochezia, or hematemesis.   GU: No dysuria, hematuria, increased frequency, or discoloration.  Musculoskeletal: No deformities, weakness, or pain.  Heme/ Lymph: No anemia, easy bruising, bleeding, or enlarged nodes.  Endocrine: No polyuria, polydipsia, dry skin, thinning hair, hot/cold intolerance.  Neuro: No numbness, tingling, sensory loss, slurred speech, or tremors.  Psych: No anxiety or depression.        Examination:  Temperature: 36.8 C (98.2 F) Heart Rate: 80 BP (Non-Invasive): (!) 134/97   Respiratory Rate: 18 SpO2-1: 96 % Pain Score (Numeric, Faces): 2     Constitutional: appears stated age, no distress and vital signs reviewed  Eyes: Conjunctiva clear., Pupils equal and round.   ENT: ENMT without erythema or injection, mucous membranes moist. Edentulous   Neck: supple, symmetrical, trachea midline  Respiratory: Clear to auscultation bilaterally. No wheezing rales or rhonchi.   Cardiovascular: regular rate and rhythm. No murmurs.   Gastrointestinal: Soft, non-tender, Bowel sounds normal  Musculoskeletal: Head atraumatic and normocephalic. No deformities  Skin: warm and dry, no rash   Neurologic: Alert and oriented x3  Psychiatric: Affect Normal, Speech normal      Labs:    Lab Results for Last 24 Hours:    Results for orders placed or performed during the hospital encounter of 04/22/16 (from the past 24 hour(s))   BASIC METABOLIC PANEL, NON-FASTING   Result Value Ref Range    SODIUM 138 136 - 145 mmol/L    POTASSIUM 3.8 3.5 - 5.1 mmol/L    CHLORIDE 105 96 - 111 mmol/L    CO2 TOTAL 25 22 - 32 mmol/L    ANION GAP 8 4 - 13 mmol/L    CALCIUM 9.2 8.5 - 10.2 mg/dL    GLUCOSE 161100 65 - 096139 mg/dL    BUN 11 8 - 25 mg/dL    CREATININE 0.451.08 4.090.62 -  1.27 mg/dL    BUN/CREA RATIO 10 6 - 22    ESTIMATED GFR >59 >59 mL/min/1.2673m2     PT/INR   Result Value Ref Range    PROTHROMBIN TIME 10.8 9.0 - 13.6 seconds    INR 0.96 0.80 - 1.20   PTT (PARTIAL THROMBOPLASTIN TIME)   Result Value Ref Range    APTT 31.4 25.1 - 36.5 seconds   TROPONIN-I (FOR ED ONLY)   Result Value Ref Range    TROPONIN I <7 0 - 30 ng/L   HIV1/HIV2 SCREEN, COMBINED ANTIGEN AND ANTIBODY   Result Value Ref Range    HIV SCREEN, COMBINED ANTIGEN & ANTIBODY Negative Negative   CBC WITH DIFF   Result Value Ref Range    WBC 12.2 (H) 3.5 - 11.0 x103/uL    RBC 5.53 4.06 - 5.63 x106/uL    HGB 16.5 (H) 12.5 - 16.3 g/dL    HCT 16.146.9 09.636.7 - 04.547.0 %    MCV 84.7 78.0 - 100.0 fL    MCH 29.8 27.4 - 33.0 pg    MCHC 35.2 32.5 - 35.8 g/dL    RDW 40.912.5 81.112.0 - 91.415.0 %    PLATELETS 225 140 - 450 x103/uL    MPV 8.8 7.5 - 11.5 fL    NEUTROPHIL % 73 %    LYMPHOCYTE % 14 %    MONOCYTE % 11 %    EOSINOPHIL % 2 %    BASOPHIL % 0 %    NEUTROPHIL # 8.88 (H) 1.50 - 7.70 x103/uL    LYMPHOCYTE # 1.75 1.00 - 4.80 x103/uL    MONOCYTE # 1.36 (H) 0.30 - 1.00 x103/uL    EOSINOPHIL # 0.19 0.00 - 0.50 x103/uL    BASOPHIL # 0.05 0.00 - 0.20 x103/uL   VENOUS BLOOD GAS/LACTATE   Result Value Ref Range    %FIO2 21.0 %    PH 7.42 (H) 7.31 - 7.41    PCO2 38.00 (L) 41.00 - 51.00 mm/Hg    PO2 28.0 (L) 35.0 - 50.0 mm/Hg    BASE EXCESS 0.3 -3.0 - 3.0 mmol/L    BICARBONATE 24.0 22.0 - 26.0 mmol/L    LACTATE 1.6 (H) 0.0 - 1.3 mmol/L   PHOSPHORUS   Result Value Ref Range    PHOSPHORUS 2.5 2.4 - 4.7 mg/dL   MAGNESIUM   Result Value Ref Range    MAGNESIUM 2.0 1.6 - 2.5 mg/dL   LIPID PANEL   Result Value Ref Range    CHOLESTEROL 114 <200 mg/dL    HDL CHOL 35 (L) >=78>=39 mg/dL    TRIGLYCERIDES 90 <=295<=150 mg/dL    LDL CALC 61 <=621<=100 mg/dL    VLDL CALC 18 <30<30 mg/dL    NON-HDL 79 <=865<=190 mg/dL    CHOL/HDL RATIO 3.3    B-TYPE NATRIURETIC PEPTIDE   Result Value Ref Range    BNP <10 <=100 pg/mL       Imaging Studies: CXR    DNR Status: Full Code    TIMI Risk Score for Unstable Angina/NSTEMI:  > CAD Risk Factors  Known CAD Stenosis > 50%  ASA  use in last 7 days  Total TIMI Risk Score (1 point for each risk listed above): 3    HEART Score:  History moderately suspicious = 1 point  ECG normal = 0 points  Age 49-65 = 1 point  Risk Factors: > 3 = 2 points  Initial Triponin normal = 0 points  Total HEART Score: 4  Assessment/Plan:   Active Hospital Problems    Diagnosis    Chest pain     50 y.o., white male, with PMH is significant for known CAD status post PCI to his LAD in August 2012, 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     Typical Chest Pain with SOB  - Risk factor for CAD including: HTN, HLD, known history of CAD, BMI 33.94  - TIMI score 3  - HEART score 4  - EKG no acute process  - Troponin x3, 1st one negative   - Lipid panel   - A1C  - MPS ordered  - Cont ASA and Plavix, cont Lipitor, cozaar, Norvasc   - hold BB prior to MPS  - cont nitrostat prn for agnina    - follow up with his cardiologist upon d/c     Dry cough / Chronic Asthma   - check streptococcus and legionella antigen   - CXR   - cont home asthma treatment    Hyperlipidemia   - cont statin   - cont lifestyle modification as patient has been doing.   - healthy living program referral     HTN  - cont home antihypertensive meds       DVT/PE Prophylaxis: Heparin    30 minutes were spent at the bedside and on the patient's hospital floor or unit.    Aili Casillas Claudette Laws, PA-C    The patient was seen independently with the co-signing faculty present in hospital.

## 2016-04-22 NOTE — Consults (Signed)
[facility]  Cardiology Consult  Initial Consultation      [name] [name],[name] [name], 50 y.o. male  Date of service: 04/22/2016  Date of Birth:    Requestion Faculty: Dr. Anell Barr    Information Obtained from: patient and history reviewed via medical record  Chief Complaint:  Chest Pain    Impression/Recommendations:  50 y/o M with hx of CAD s/p PCI in LAD (August 2012), PTCA for in-stent restenosis in April 2016 and October 2016, sleep apnea (CPAP), HLD and HTN presents with chest pain. 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 Cardiology consulted for (+) MPS and cath evaluation.    - Recommend optimizing medical management   - Increase Norvasc to 10 mg   - Increase me metoprolol to 25 mg BID   - Start IMDUR 30 mg  - Recommend following-up in cardiology clinic as outpatient  - If patient experiences pain, recommend returning to hospital for possible cath    Thank you for the consult. Please call with any questions.    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 Patient states that he was awakened from sleep at 2:30am with substernal chest pain that radiated to his L shoulder. Patient states he took [name] nitro tablet which helped relieve the pain. Endorsed nausea and diaphoresis. Patient stated he also had worsening SOB with the chest pain. Denied fever, chills and LE edema. On admission, troponins were negative. EKG showed sinus rhythm. MPS was positive for transient ischemic dilation of the LV due to multivessel ischemia.    Patient quit smoking 10 years prior. Patient has [name] premature family history of coronary artery disease in mother and father.     Cardiac Risk Factors:  CAD, MI:  STEMI, When?  2012, hyperlipidemia, tobacco  use history, stop date unknown, history of stents / PTCA, Family History of CAD male less than 71 yrs of age, Family History of CAD male less than 39 yrs of age, Diagnosis of Sleep apnea CPAP used  Past Medical History:   Diagnosis Date    Asthma     Coronary artery disease     CPAP (continuous positive airway pressure) dependence     Esophageal reflux     Hypercholesterolemia     Hypertension     Knee injury     Sleep apnea          Past Surgical History:   Procedure Laterality Date    CORONARY ARTERY ANGIOPLASTY      HX DENTAL EXTRACTION      HX STENTING (ANY)  06/15/2011    HX TONSIL AND ADENOIDECTOMY           Prior to Admission Medications:  Medications Prior to Admission     Prescriptions    ALBUTEROL SULFATE (PROAIR HFA INHL)    Take 1-2 Puffs by inhalation Every 6 hours as needed     atorvastatin (LIPITOR) 20 mg Oral Tablet    Take 1 Tab (20 mg total) by mouth Every evening    clopidogrel (PLAVIX) 75 mg Oral Tablet    Take 1 Tab (75 mg total) by mouth Once [name] day    fluticasone (FLONASE) 50 mcg/actuation Nasal Spray, Suspension  1 Spray by Each Nostril route Once [name] day    fluticasone-vilanterol 100-25 mcg/dose Inhalation Disk with Device    Take 1 INHALATION by inhalation Once [name] day     ipratropium-albuterol 0.5 mg-3 mg(2.5 mg base)/3 mL Solution for Nebulization    3 mL by Nebulization route Four times [name] day    losartan (COZAAR) 50 mg Oral Tablet    Take 1 Tab (50 mg total) by mouth Once [name] day    montelukast (SINGULAIR) 10 mg Oral Tablet    Take 10 mg by mouth Every evening    nitroglycerin (NITROSTAT) 0.4 mg Sublingual Tablet, Sublingual    0.4 mg by Sublingual route Every 5 minutes as needed for Chest pain for 3 doses over 15 minutes    ranitidine (ZANTAC) 150 mg Oral Tablet    Take 150 mg by mouth Twice daily    amLODIPine (NORVASC) 2.5 mg Oral Tablet    Take 1 Tab (2.5 mg total) by mouth Once [name] day    aspirin 325 mg Oral Tablet    Take 0.25 Tabs (81 mg total) by mouth Once [name] day Take 81  mg by mouth Once [name] day    cholecalciferol, vitamin D3, 1,000 unit Oral Tablet    Take 1,000 Units by mouth Once [name] day    metoprolol (LOPRESSOR) 25 mg Oral Tablet    Take 0.5 Tabs (12.5 mg total) by mouth Twice daily        Inpatient Medications:    Current Facility-Administered Medications:  albuterol 90 mcg per inhalation oral inhaler - "Respiratory to administer" 1 Puff Inhalation Q6H PRN   [START ON 04/23/2016] amLODIPine (NORVASC) tablet 5 mg Oral Daily   aspirin chewable tablet 81 mg 81 mg Oral Daily   atorvastatin (LIPITOR) tablet 20 mg Oral QPM   clopidogrel (PLAVIX) 75 mg tablet 75 mg Oral Daily   enoxaparin PF (LOVENOX) 40 mg/0.4 mL SubQ injection 40 mg Subcutaneous Q24H   famotidine (PEPCID) tablet 20 mg Oral 2x/day   fluticasone (FLONASE) 50 mcg per spray nasal spray 1 Spray Each Nostril Daily   influenza virus vaccine (PF) IM injection (ages 46 months-ADULT) 0.5 mL Intramuscular Once   ipratropium-albuterol 0.5 mg-3 mg(2.5 mg base)/3 mL Solution for Nebulization 3 mL Nebulization 4x/day   losartan (COZAAR) tablet 50 mg Oral Daily   metoprolol tartrate (LOPRESSOR) tablet 25 mg Oral 2x/day   montelukast (SINGULAIR) 10 mg tablet 10 mg Oral QPM   nitroGLYCERIN (NITROSTAT) sublingual tablet 0.4 mg Sublingual Q5 Min PRN   NS flush syringe 2 mL Intracatheter Q8HRS   And      NS flush syringe 2-6 mL Intracatheter Q1 MIN PRN   NS flush syringe 2 mL Intracatheter Q8HRS   And      NS flush syringe 2-6 mL Intracatheter Q1 MIN PRN     Allergies   Allergen Reactions    Phenergan [Promethazine] Seizure     Dye Allergy:  No  Iodine Allergy:  No    Family History  Premature FH of CAD in mom and dad    Social History  Social History     Social History    Marital status: Married     Spouse name: N/[name]    Number of children: N/[name]    Years of education: N/[name]     Occupational History    Not on file.     Social History Main Topics    Smoking status: Former Smoker     Years: 10.00  Quit date: 04/20/1992    Smokeless  tobacco: Never Used    Alcohol use No    Drug use: No    Sexual activity: Not on file     Other Topics Concern    Not on file     Social History Narrative       ROS: Other than ROS in the HPI, all other systems were negative.    Exam:  Temperature: 37.2 C (99 F)  Heart Rate: (!) 102  BP (Non-Invasive): (!) 143/94  Respiratory Rate: 18  SpO2-1: 97 %  Pain Score (Numeric, Faces): 0  Constitutional: appears stated age, no distress and vital signs reviewed  Eyes: Conjunctiva clear., Pupils equal and round.   ENT: ENMT without erythema or injection, mucous membranes moist. Edentulous   Neck: supple, symmetrical, trachea midline  Respiratory: Clear to auscultation bilaterally. No wheezing rales or rhonchi.   Cardiovascular: regular rate and rhythm. No murmurs.   Gastrointestinal: Soft, non-tender, Bowel sounds normal  Musculoskeletal: Head atraumatic and normocephalic. No deformities  Skin: warm and dry, no rash   Neurologic: Alert and oriented x3  Psychiatric: Affect Normal, Speech normal    Labs:    I have reviewed all lab results.  Lab Results for Last 24 Hours:    Results for orders placed or performed during the hospital encounter of 04/22/16 (from the past 24 hour(s))   BASIC METABOLIC PANEL, NON-FASTING   Result Value Ref Range    SODIUM 138 136 - 145 mmol/L    POTASSIUM 3.8 3.5 - 5.1 mmol/L    CHLORIDE 105 96 - 111 mmol/L    CO2 TOTAL 25 22 - 32 mmol/L    ANION GAP 8 4 - 13 mmol/L    CALCIUM 9.2 8.5 - 10.2 mg/dL    GLUCOSE 161 65 - 096 mg/dL    BUN 11 8 - 25 mg/dL    CREATININE 0.45 4.09 - 1.27 mg/dL    BUN/CREA RATIO 10 6 - 22    ESTIMATED GFR >59 >59 mL/min/1.89m2   PT/INR   Result Value Ref Range    PROTHROMBIN TIME 10.8 9.0 - 13.6 seconds    INR 0.96 0.80 - 1.20   PTT (PARTIAL THROMBOPLASTIN TIME)   Result Value Ref Range    APTT 31.4 25.1 - 36.5 seconds   TROPONIN-I (FOR ED ONLY)   Result Value Ref Range    TROPONIN I <7 0 - 30 ng/L   HIV1/HIV2 SCREEN, COMBINED ANTIGEN AND ANTIBODY   Result Value Ref  Range    HIV SCREEN, COMBINED ANTIGEN & ANTIBODY Negative Negative   CBC WITH DIFF   Result Value Ref Range    WBC 12.2 (H) 3.5 - 11.0 x103/uL    RBC 5.53 4.06 - 5.63 x106/uL    HGB 16.5 (H) 12.5 - 16.3 g/dL    HCT 81.1 91.4 - 78.2 %    MCV 84.7 78.0 - 100.0 fL    MCH 29.8 27.4 - 33.0 pg    MCHC 35.2 32.5 - 35.8 g/dL    RDW 95.6 21.3 - 08.6 %    PLATELETS 225 140 - 450 x103/uL    MPV 8.8 7.5 - 11.5 fL    NEUTROPHIL % 73 %    LYMPHOCYTE % 14 %    MONOCYTE % 11 %    EOSINOPHIL % 2 %    BASOPHIL % 0 %    NEUTROPHIL # 8.88 (H) 1.50 - 7.70 x103/uL    LYMPHOCYTE # 1.75  1.00 - 4.80 x103/uL    MONOCYTE # 1.36 (H) 0.30 - 1.00 x103/uL    EOSINOPHIL # 0.19 0.00 - 0.50 x103/uL    BASOPHIL # 0.05 0.00 - 0.20 x103/uL   VENOUS BLOOD GAS/LACTATE   Result Value Ref Range    %FIO2 21.0 %    PH 7.42 (H) 7.31 - 7.41    PCO2 38.00 (L) 41.00 - 51.00 mm/Hg    PO2 28.0 (L) 35.0 - 50.0 mm/Hg    BASE EXCESS 0.3 -3.0 - 3.0 mmol/L    BICARBONATE 24.0 22.0 - 26.0 mmol/L    LACTATE 1.6 (H) 0.0 - 1.3 mmol/L   LIGHT GREEN TOP TUBE   Result Value Ref Range    RAINBOW/EXTRA TUBE AUTO RESULT Yes    PHOSPHORUS   Result Value Ref Range    PHOSPHORUS 2.5 2.4 - 4.7 mg/dL   MAGNESIUM   Result Value Ref Range    MAGNESIUM 2.0 1.6 - 2.5 mg/dL   [id number] (HEMOGLOBIN W1UA1C WITH EST AVG GLUCOSE)   Result Value Ref Range    HEMOGLOBIN A1C 5.9 4.0 - 6.0 %    ESTIMATED AVERAGE GLUCOSE 123 mg/dL   LIPID PANEL   Result Value Ref Range    CHOLESTEROL 114 <200 mg/dL    HDL CHOL 35 (L) >=27>=39 mg/dL    TRIGLYCERIDES 90 <=253<=150 mg/dL    LDL CALC 61 <=664<=100 mg/dL    VLDL CALC 18 <40<30 mg/dL    NON-HDL 79 <=347<=190 mg/dL    CHOL/HDL RATIO 3.3    ECG 12-LEAD   Result Value Ref Range    Ventricular rate 92 BPM    Atrial Rate 92 BPM    PR Interval 168 ms    QRS Duration 92 ms    QT Interval 340 ms    QTC Calculation 420 ms    Calculated P Axis 20 degrees    Calculated R Axis   degrees    Calculated T Axis 36 degrees   B-TYPE NATRIURETIC PEPTIDE   Result Value Ref Range    BNP <10  <=100 pg/mL   TROPONIN-I   Result Value Ref Range    TROPONIN I <7 0 - 30 ng/L   DRUG SCREEN, HIGH OPIATE CUTOFF, NO CONFIRMATION, URINE   Result Value Ref Range    BUPRENORPHINE URINE Negative Negative    CANNABINOIDS URINE Negative Negative    COCAINE METABOLITES URINE Negative Negative    METHADONE URINE Negative Negative    OPIATES URINE (HIGH CUTOFF) Negative Negative    OXYCODONE URINE Negative Negative    ECSTASY/MDMA URINE Negative Negative    CREATININE RANDOM URINE 273 No Reference Range Established mg/dL    AMPHETAMINES URINE Negative Negative    BARBITURATES URINE Negative Negative    BENZODIAZEPINES URINE Negative Negative   LEGIONELLA URINE ANTIGEN   Result Value Ref Range    LEGIONELLA ANTIGEN Negative Negative, Indeterminate   STREPTOCOCCUS PNEUMONIAE ANTIGEN,URINE   Result Value Ref Range    S.PNEUMONIA ANTIGEN Negative Negative   CBC WITH DIFF   Result Value Ref Range    WBC 11.3 (H) 3.5 - 11.0 x103/uL    RBC 5.08 4.06 - 5.63 x106/uL    HGB 15.1 12.5 - 16.3 g/dL    HCT 42.543.3 95.636.7 - 38.747.0 %    MCV 85.3 78.0 - 100.0 fL    MCH 29.8 27.4 - 33.0 pg    MCHC 35.0 32.5 - 35.8 g/dL    RDW 56.412.7 33.212.0 - 95.115.0 %  PLATELETS 217 140 - 450 x103/uL    MPV 8.8 7.5 - 11.5 fL    NEUTROPHIL % 70 %    LYMPHOCYTE % 17 %    MONOCYTE % 12 %    EOSINOPHIL % 2 %    BASOPHIL % 0 %    NEUTROPHIL # 7.85 (H) 1.50 - 7.70 x103/uL    LYMPHOCYTE # 1.86 1.00 - 4.80 x103/uL    MONOCYTE # 1.34 (H) 0.30 - 1.00 x103/uL    EOSINOPHIL # 0.21 0.00 - 0.50 x103/uL    BASOPHIL # 0.04 0.00 - 0.20 x103/uL     Imaging Studies:    Previous echo results:  Not available  Previous ECG results:  04/22/16: sinus rhythm  Previous cath results:  04/21/15: 1.  Patent left anterior descending artery stents.  2.  Mild coronary artery disease.  3.  Normal left ventricular function study.  4.  Normal left ventricular hemodynamics.  Previous stess results:  04/22/16: 1. 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  No regional ischemia.  2. Abnormal TID ratio of 1.24 suggests  transient ischemic dilation of the LV due to multivessel ischemia.  3. Calculated LVEF of 52%.    Iva BoopFaraze Niazi, MD 04/22/2016 18:36  PGY-1 Neurology  Pager [phone]            I saw and examined the patient.  I reviewed the resident's note.  I agree with the findings and plan of care as documented in the resident's note.  Any exceptions/additions are edited/noted.    Merrilee SeashoreAbnash Chander Melvina Pangelinan, MD

## 2016-04-22 NOTE — ED Nurses Note (Signed)
[facility] care[facility]  Pt denies needs at this time and reports relieved chest pain

## 2016-04-22 NOTE — ED Nurses Note (Signed)
[facility] RN called stating they are ready for pt at any time. This RN asked Melissa, unit clerk to put pt back into teletrack after teletrack was cancelled due to telemetry not being notified previously of pt needing telepack. This RN notified telemetry of need for telepack and now waiting for transport to arrive. Pt and wife in room, pt laying on stretcher, on cardiac monitor, NPO and denies needs.

## 2016-04-22 NOTE — ED Nurses Note (Signed)
Report received from Andreah, RN

## 2016-04-22 NOTE — ED Provider Notes (Signed)
Emergency Department  Provider Note    Name: [name] [name]  Code Status: Full  Age and Gender: 50 y.o. male  Date of Birth:   Date of Service: 04/22/2016  MRN: [id number]  PCP: Randol Kern, PA-C  Attending: Serina Cowper MD      Arrival: The patient arrived by private car     History Obtained by: provider  History Limitations: none    CC:  Chief Complaint   Patient presents with    Chest Pain      Patient states chest pain that woke him up radiating to left arm at 2 a.m. Pt states he's had a cold and has been coughing x 2 weeks.Pt has hx of 3 stents last year.       HPI:  [name] [name] is a 49 y.o. White male presenting with substernal chest pain which woke him this morning at 2 am. The pain is associated with nausea and diaphoresis and improved with taking nitro and aspirin. Radiating to the left arm.     The patient also has a history of dry cough for the past two weeks. The patient tried a breathing treatment this morning with some improvement in his symptoms.    He has a PMH of HTN, CAD s/p PCI in 2011 and 2012 and balloon angioplasty to the LAD in September 2016. He follows with Dr Marylen Ponto as his primary cardiologist.        ROS:  Constitutional: No fever, chills or weakness   Skin: No rash or diaphoresis  HENT: No headaches, or congestion  Eyes: No vision changes or photophobia   Cardio: Positive for chest pain,    Respiratory: cough  GI:  Nausea, no vomiting or stool changes. No abdominal pain.   GU:  No dysuria, hematuria, or increased frequency  MSK: No muscle aches, joint or back pain  Neuro: No seizures, LOC, numbness, tingling, or focal weakness  Psychiatric: No depression, SI or substance abuse  All other systems reviewed and are negative.      History: Pertinent information below was reviewed with patient.     Past Medical History:   Diagnosis Date    Asthma     Coronary artery disease     CPAP (continuous positive airway pressure) dependence     Esophageal reflux     Hypercholesterolemia      Hypertension     Knee injury     Sleep apnea            Medications Prior to Admission     Prescriptions    ALBUTEROL SULFATE (PROAIR HFA INHL)    Take 1-2 Puffs by inhalation Every 6 hours as needed     amLODIPine (NORVASC) 2.5 mg Oral Tablet    Take 1 Tab (2.5 mg total) by mouth Once a day    aspirin 325 mg Oral Tablet    Take 0.25 Tabs (81 mg total) by mouth Once a day Take 81 mg by mouth Once a day    atorvastatin (LIPITOR) 20 mg Oral Tablet    Take 1 Tab (20 mg total) by mouth Every evening    cholecalciferol, vitamin D3, 1,000 unit Oral Tablet    Take 1,000 Units by mouth Once a day    clopidogrel (PLAVIX) 75 mg Oral Tablet    Take 1 Tab (75 mg total) by mouth Once a day    fluticasone (FLONASE) 50 mcg/actuation Nasal Spray, Suspension    1 Spray by Each Nostril  route Once a day    fluticasone-vilanterol 100-25 mcg/dose Inhalation Disk with Device    Take 1 INHALATION by inhalation Once a day     ipratropium-albuterol 0.5 mg-3 mg(2.5 mg base)/3 mL Solution for Nebulization    3 mL by Nebulization route Four times a day    losartan (COZAAR) 50 mg Oral Tablet    Take 1 Tab (50 mg total) by mouth Once a day    metoprolol (LOPRESSOR) 25 mg Oral Tablet    Take 0.5 Tabs (12.5 mg total) by mouth Twice daily    montelukast (SINGULAIR) 10 mg Oral Tablet    Take 10 mg by mouth Every evening    nitroglycerin (NITROSTAT) 0.4 mg Sublingual Tablet, Sublingual    0.4 mg by Sublingual route Every 5 minutes as needed for Chest pain for 3 doses over 15 minutes    ranitidine (ZANTAC) 150 mg Oral Tablet    Take 150 mg by mouth Twice daily          Allergies   Allergen Reactions    Phenergan [Promethazine] Seizure       Past Surgical History:   Procedure Laterality Date    CORONARY ARTERY ANGIOPLASTY      HX DENTAL EXTRACTION      HX STENTING (ANY)  06/15/2011    HX TONSIL AND ADENOIDECTOMY             No family history on file.    Social History   Substance Use Topics    Smoking status: Former Smoker     Years: 10.00      Quit date: 04/20/1992    Smokeless tobacco: Never Used    Alcohol use No           Objective:    ED Triage Vitals   Enc Vitals Group      BP (Non-Invasive) 04/22/16 0459 130/78      Heart Rate 04/22/16 0459 96      Respiratory Rate 04/22/16 0459 20      Temperature 04/22/16 0459 36.8 C (98.2 F)      Temp src --       SpO2-1 04/22/16 0459 95 %      Weight 04/22/16 0459 122.9 kg (270 lb 15.1 oz)      Height 04/22/16 0459 1.905 m (6\' 3" )      Head Cir --       Peak Flow --       Pain Score --       Pain Loc --       Pain Edu? --       Excl. in GC? --        Nursing notes and vital signs reviewed.    Constitutional: Pleasant 50 y.o. male in mild distress   HENT:   Head: Normocephalic and atraumatic.   Mouth/Throat: Oropharynx is clear and moist.   Eyes: EOMI, PERRL   Neck: Trachea midline. Neck supple.  Cardiovascular: RRR, No murmurs, rubs or gallops. Intact distal pulses.  Pulmonary/Chest: BS equal bilaterally. No respiratory distress. No wheezes, rales or chest tenderness.   Abdominal: BS +. Abdomen soft, no tenderness, rebound or guarding.  Back: No midline spinal tenderness, no paraspinal tenderness, no CVA tenderness.           Musculoskeletal: No edema, tenderness or deformity.  Skin: warm and dry. No rash, erythema, pallor or cyanosis  Psychiatric: normal mood and affect. Behavior is normal.   Neurological: Patient keenly alert and  responsive, CN II-XII grossly intact, moving all extremities equally and fully, normal gait    MDM/Course:  Patient seen and examined. Discussion was had with attending physician, Dr. Gelene MinkFrederick, who also saw and evaluated the pt.       [name] Finerodd A [name] [name] is 50 y.o. male who presented with chest pain and diaphoresis. Differential diagnosis includes, but is not limited to, ACS, musculoskeletal pain, pleuritic chest pain 2/2 pneumonia/bronchitis.  Orders as listed below for further evaluation and workup.     Orders Placed This Encounter    XR AP MOBILE CHEST    CBC/DIFF    BASIC  METABOLIC PANEL, NON-FASTING    PT/INR    PTT (PARTIAL THROMBOPLASTIN TIME)    TROPONIN-I (FOR ED ONLY)    HIV1/HIV2 SCREEN, COMBINED ANTIGEN AND ANTIBODY    CBC WITH DIFF    VENOUS BLOOD GAS/LACTATE    CANCELED: B-TYPE NATRIURETIC PEPTIDE    B-TYPE NATRIURETIC PEPTIDE    EXTRA TUBES - RUBY ONLY    LIGHT GREEN TOP TUBE    PHOSPHORUS    MAGNESIUM    TROPONIN-I    ECG 12-LEAD    INSERT & MAINTAIN PERIPHERAL IV ACCESS    AND Linked Order Group     NS flush syringe     NS flush syringe    dextromethorphan-guaiFENesin (ROBITUSSIN DM) 10-100mg  per 5mL oral liquid                 Significant labs:   Troponin: negative  WBC: 12.2  EKG: Negative for ischemic process    Risk stratification: Moderate    Significant imaging- reads per radiology:    No orders to display              The decision was made to consult med hospitalitis 8 for admission to the hospital for further treatment and evaluation.         Care of [name] Finerodd A Love was checked out to Dr. Claris PongFroebel at 0700 following a discussion of the patient's course.    Disposition: Data Unavailable    Clinical Impression:   No diagnosis found.    Follow up:    No follow-up provider specified.    Future Appointments scheduled in Epic:   Future Appointments  Date Time Provider Department Center   06/02/2016 2:20 PM Jodi Geraldsoda-Renzelli, Anthony, MD [facility]       Parts of this patients chart were completed in a retrospective fashion due to simultaneous direct patient care activities in the Emergency Department.       Erie NoeFatima Leighana Neyman, MD  PGY-1 Internal Medicine  [facility] [employer] [employer]

## 2016-04-25 ENCOUNTER — Telehealth (INDEPENDENT_AMBULATORY_CARE_PROVIDER_SITE_OTHER): Payer: Self-pay

## 2016-04-25 NOTE — Telephone Encounter (Signed)
Transitional Care Management Contact Type Phone Call Attempt Prescriptions filled? Confirmed Time/Date/Place of Appointment? Is appointment within 14 days of discharge? Was appointment made?   04/25/2016 Telephone Answer 1 N/A No - No      Transitional Care Management (Continued) DME Equipment? Home Health Ordered?   04/25/2016 N/A No   Spoke with patient's wife who reports that he verbalized understanding of discharge instructions. She states no issues at this time.

## 2016-05-04 ENCOUNTER — Ambulatory Visit: Payer: No Typology Code available for payment source | Attending: Internal Medicine | Admitting: Physician Assistant

## 2016-05-04 ENCOUNTER — Encounter (HOSPITAL_COMMUNITY): Payer: Self-pay | Admitting: Physician Assistant

## 2016-05-04 VITALS — BP 127/81 | HR 100 | Temp 98.2°F | Ht 74.41 in | Wt 269.0 lb

## 2016-05-04 DIAGNOSIS — Z7902 Long term (current) use of antithrombotics/antiplatelets: Secondary | ICD-10-CM | POA: Insufficient documentation

## 2016-05-04 DIAGNOSIS — I2510 Atherosclerotic heart disease of native coronary artery without angina pectoris: Secondary | ICD-10-CM | POA: Insufficient documentation

## 2016-05-04 DIAGNOSIS — G4733 Obstructive sleep apnea (adult) (pediatric): Secondary | ICD-10-CM | POA: Insufficient documentation

## 2016-05-04 DIAGNOSIS — Z955 Presence of coronary angioplasty implant and graft: Secondary | ICD-10-CM | POA: Insufficient documentation

## 2016-05-04 DIAGNOSIS — E785 Hyperlipidemia, unspecified: Secondary | ICD-10-CM | POA: Insufficient documentation

## 2016-05-04 DIAGNOSIS — Z7982 Long term (current) use of aspirin: Secondary | ICD-10-CM | POA: Insufficient documentation

## 2016-05-04 DIAGNOSIS — I10 Essential (primary) hypertension: Secondary | ICD-10-CM | POA: Insufficient documentation

## 2016-05-04 DIAGNOSIS — Z79899 Other long term (current) drug therapy: Secondary | ICD-10-CM | POA: Insufficient documentation

## 2016-05-04 NOTE — Progress Notes (Signed)
See dictated note.

## 2016-05-05 NOTE — Progress Notes (Signed)
PATIENT NAME: [name] [name], [name] [name]  HOSPITAL NUMBER:  [id number]  DATE OF SERVICE: 05/04/2016  DATE OF BIRTH:      CLINIC NOTE    PRIMARY CARE PROVIDER:  Randol Kernarrie Leach, PA-C.    PRIMARY CARDIOLOGIST:  Jodi GeraldsAnthony Roda-Renzelli, MD.    SUBJECTIVE:  [name] is [name] 50 year old white male gentleman with [name] known history of coronary disease, status post PCI to his LAD in 2012 followed by PTCA for in-stent restenoses in October 2016.  He has history of hypertension, hyperlipidemia and obstructive sleep apnea.  The patient was recently hospitalized after presenting to the Emergency Department with complaints of cough and shortness of breath.  The patient states that as soon as they heard that he was [name] cardiac patient they only focused on his heart and did not deal with his cough.  The patient was admitted to the med hospitalist service.  [name] myocardial perfusion scan was done showing questionable TID without evidence of ischemia.  The patient's medical therapy was advanced by increasing his Lopressor and Norvasc as well as starting Imdur.  He comes in today for followup.     The patient states that he is still frustrated with the cough and shortness of breath.  He states that he does have coughing spells that he cannot control.  He states this has been going on for some time now.  He denies any chest pain or chest pressure.  He denies any exertional dyspnea.  He does complain of increased cough with lying flat.  He does not really state that he has much shortness of breath because he has been staying in the recliner.  He has tried NyQuil, cough drops and guaifenesin for the cough without much resolution.  He does not feel that this is cardiac in origin.  He states that he feels good from his cardiac standpoint.    OBJECTIVE:  Physical exam shows [name] very pleasant 50 year old white male gentleman.  He is afebrile and hemodynamically stable.  Blood pressure is 127/81, pulse is 90, weight is 122 kg.  HEENT:  Head is normocephalic,  atraumatic.  Eyes are nonicteric.  Nose is midline.  Neck is supple.  Carotids equal bilateral without bruit.  His heart is regular rate and rhythm without murmur.  His lungs are clear bilaterally.  Abdomen is large, round and soft.  Bowel sounds are positive.  There is no guarding or rebound.  Extremities are without edema.  Skin is without rash.    MEDICATIONS:    Current Outpatient Prescriptions   Medication Sig    ALBUTEROL SULFATE (PROAIR HFA INHL) Take 1-2 Puffs by inhalation Every 6 hours as needed     amLODIPine (NORVASC) 5 mg Oral Tablet Take 1 Tab (5 mg total) by mouth Once [name] day    aspirin 81 mg Oral Tablet, Chewable Take 1 Tab (81 mg total) by mouth Once [name] day for 30 days    atorvastatin (LIPITOR) 20 mg Oral Tablet Take 1 Tab (20 mg total) by mouth Every evening    clopidogrel (PLAVIX) 75 mg Oral Tablet Take 1 Tab (75 mg total) by mouth Once [name] day    fluticasone (FLONASE) 50 mcg/actuation Nasal Spray, Suspension 1 Spray by Each Nostril route Once [name] day    fluticasone-vilanterol 100-25 mcg/dose Inhalation Disk with Device Take 1 INHALATION by inhalation Once [name] day     ipratropium-albuterol 0.5 mg-3 mg(2.5 mg base)/3 mL Solution for Nebulization 3 mL by Nebulization route Four times [name] day  losartan (COZAAR) 50 mg Oral Tablet Take 1 Tab (50 mg total) by mouth Once [name] day    metoprolol (LOPRESSOR) 25 mg Oral Tablet Take 1 Tab (25 mg total) by mouth Twice daily    montelukast (SINGULAIR) 10 mg Oral Tablet Take 10 mg by mouth Every evening    nitroglycerin (NITROSTAT) 0.4 mg Sublingual Tablet, Sublingual 0.4 mg by Sublingual route Every 5 minutes as needed for Chest pain for 3 doses over 15 minutes    ranitidine (ZANTAC) 150 mg Oral Tablet Take 150 mg by mouth Twice daily     ASSESSMENT AND PLAN:  1. Coronary disease, status post previous PCI to his LAD in August 2012, followed by PTCA for in-stent restenoses in April 2016 and October 2016.  The patient currently remains anginal free and  asymptomatic from [name] cardiac standpoint on aspirin, Plavix, Norvasc, Lopressor and Imdur.  At this point in time, we will not do any further cardiac workup except check [name] 2D echocardiogram.  I have recommended that he follow up with his primary care physician about his cough and to look at other causes of cough as far as acid reflux, sinus drainage, etc.   2. Dyslipidemia.  Remains on Lipitor.  3. Hypertension.  Currently well controlled with Cozaar, Lopressor and Norvasc.  4. History of obstructive sleep apnea.  Remains on CPAP.  5. We will see the patient back in [name] 3747-month time period.  In the meantime, we will go ahead and check [name] 2D echocardiogram and proceed from there.      The patient was seen independently with supervising physician available for consultation.    Curlene DolphinStephen Gnegy, PA-C  Chief Advanced Practice Professional   [place] Department of Medicine, Section of Cardiology    Jodi GeraldsAnthony Roda-Renzelli, MD  Assistant Professor, Interventional Cardiology   [facility]      Agree with the above       Jodi GeraldsAnthony Roda-Renzelli, MD  05/06/2016, 09:27      CC:   Randol Kernarrie Leach, PA-C   Fax: [phone]       DD:  05/04/2016 18:15:35  DT:  05/05/2016 11:09:15 CB  D#:  [id number]

## 2016-05-30 ENCOUNTER — Other Ambulatory Visit (HOSPITAL_COMMUNITY): Payer: Self-pay | Admitting: Internal Medicine

## 2016-05-30 DIAGNOSIS — I2510 Atherosclerotic heart disease of native coronary artery without angina pectoris: Secondary | ICD-10-CM

## 2016-05-30 DIAGNOSIS — I10 Essential (primary) hypertension: Secondary | ICD-10-CM

## 2016-05-30 MED ORDER — CLOPIDOGREL 75 MG TABLET
75.0000 mg | ORAL_TABLET | Freq: Every day | ORAL | 1 refills | Status: DC
Start: 2016-05-30 — End: 2017-04-11

## 2016-06-01 ENCOUNTER — Ambulatory Visit
Admission: RE | Admit: 2016-06-01 | Discharge: 2016-06-01 | Disposition: A | Discharge status: Home / Self Care | Payer: No Typology Code available for payment source | Source: Ambulatory Visit | Attending: Physician Assistant | Admitting: Physician Assistant

## 2016-06-01 DIAGNOSIS — E785 Hyperlipidemia, unspecified: Secondary | ICD-10-CM | POA: Insufficient documentation

## 2016-06-01 DIAGNOSIS — I10 Essential (primary) hypertension: Secondary | ICD-10-CM | POA: Insufficient documentation

## 2016-06-01 DIAGNOSIS — I2510 Atherosclerotic heart disease of native coronary artery without angina pectoris: Secondary | ICD-10-CM | POA: Insufficient documentation

## 2016-06-01 LAB — TRANSTHORACIC ECHOCARDIOGRAM - ADULT
AV mean gradient: 0
AV peak velocity post stress: 0
AVA VTI: 0
AVA Vmax: 0
AVE E/e': 0
AVE E/e': 0
Biplane Simpson EF: 61
EF MEASUREMENT VALUE: 70
Interventricular Septum Diastolic Thickness by 2D: 1 cm
LVIDD - 2D: 4.1 cm
LVIDS 2D: 2.5 cm
LVPWD: 1.1 cm
Lateral MV Annulus e': 0
MV E/A: 0
Medial MV Annulus e': 0
Please see Linked Document for Final Report.: NONE SEEN
Please see Linked Document for Final Report.: NONE SEEN
Please see Linked Document for Final Report.: NORMAL
RVSP: 0
TR VELOCITY: 191

## 2016-06-01 MED ORDER — PERFLUTREN LIPID MICROSPHERES 1.1 MG/ML INTRAVENOUS SUSPENSION
0.30 mL | INTRAVENOUS | Status: AC
Start: 2016-06-01 — End: 2016-06-01

## 2016-06-01 MED ORDER — PERFLUTREN LIPID MICROSPHERES 1.1 MG/ML INTRAVENOUS SUSPENSION
INTRAVENOUS | Status: AC
Start: 2016-06-01 — End: 2016-06-01
  Administered 2016-06-01: 08:00:00 0.3 mL via INTRAVENOUS
  Filled 2016-06-01: qty 2

## 2016-06-02 ENCOUNTER — Encounter (HOSPITAL_COMMUNITY): Payer: Self-pay | Admitting: Internal Medicine

## 2016-06-10 ENCOUNTER — Other Ambulatory Visit (HOSPITAL_COMMUNITY): Payer: Self-pay | Admitting: Internal Medicine

## 2016-06-10 MED ORDER — AMLODIPINE 5 MG TABLET
5.0000 mg | ORAL_TABLET | Freq: Every day | ORAL | 3 refills | Status: DC
Start: 2016-06-10 — End: 2016-09-16

## 2016-06-10 MED ORDER — LOSARTAN 50 MG TABLET
50.0000 mg | ORAL_TABLET | Freq: Every day | ORAL | 3 refills | Status: DC
Start: 2016-06-10 — End: 2016-09-16

## 2016-06-23 ENCOUNTER — Encounter (HOSPITAL_COMMUNITY): Payer: Self-pay | Admitting: Internal Medicine

## 2016-07-28 ENCOUNTER — Encounter (HOSPITAL_COMMUNITY): Payer: Self-pay | Admitting: Internal Medicine

## 2016-09-06 ENCOUNTER — Inpatient Hospital Stay (HOSPITAL_COMMUNITY): Payer: No Typology Code available for payment source | Admitting: Internal Medicine

## 2016-09-06 ENCOUNTER — Inpatient Hospital Stay
Admission: EM | Admit: 2016-09-06 | Discharge: 2016-09-16 | DRG: 234 | Disposition: A | Discharge status: Home Health | Payer: BC Managed Care – PPO | Source: Other Acute Inpatient Hospital | Attending: Specialist | Admitting: Specialist

## 2016-09-06 DIAGNOSIS — I200 Unstable angina: Secondary | ICD-10-CM

## 2016-09-06 DIAGNOSIS — G4730 Sleep apnea, unspecified: Secondary | ICD-10-CM | POA: Diagnosis present

## 2016-09-06 DIAGNOSIS — N179 Acute kidney failure, unspecified: Secondary | ICD-10-CM | POA: Diagnosis not present

## 2016-09-06 DIAGNOSIS — I10 Essential (primary) hypertension: Secondary | ICD-10-CM | POA: Diagnosis present

## 2016-09-06 DIAGNOSIS — R0902 Hypoxemia: Secondary | ICD-10-CM | POA: Diagnosis not present

## 2016-09-06 DIAGNOSIS — E785 Hyperlipidemia, unspecified: Secondary | ICD-10-CM

## 2016-09-06 DIAGNOSIS — I255 Ischemic cardiomyopathy: Secondary | ICD-10-CM | POA: Diagnosis present

## 2016-09-06 DIAGNOSIS — K219 Gastro-esophageal reflux disease without esophagitis: Secondary | ICD-10-CM | POA: Diagnosis present

## 2016-09-06 DIAGNOSIS — T82855A Stenosis of coronary artery stent, initial encounter: Principal | ICD-10-CM | POA: Diagnosis present

## 2016-09-06 DIAGNOSIS — I97611 Postprocedural hemorrhage and hematoma of a circulatory system organ or structure following cardiac bypass: Secondary | ICD-10-CM | POA: Diagnosis not present

## 2016-09-06 DIAGNOSIS — E872 Acidosis: Secondary | ICD-10-CM | POA: Diagnosis not present

## 2016-09-06 DIAGNOSIS — E876 Hypokalemia: Secondary | ICD-10-CM | POA: Diagnosis not present

## 2016-09-06 DIAGNOSIS — I252 Old myocardial infarction: Secondary | ICD-10-CM

## 2016-09-06 DIAGNOSIS — R0603 Acute respiratory distress: Secondary | ICD-10-CM | POA: Diagnosis not present

## 2016-09-06 DIAGNOSIS — I25110 Atherosclerotic heart disease of native coronary artery with unstable angina pectoris: Secondary | ICD-10-CM | POA: Diagnosis present

## 2016-09-06 DIAGNOSIS — J45909 Unspecified asthma, uncomplicated: Secondary | ICD-10-CM | POA: Diagnosis present

## 2016-09-06 DIAGNOSIS — Y831 Surgical operation with implant of artificial internal device as the cause of abnormal reaction of the patient, or of later complication, without mention of misadventure at the time of the procedure: Secondary | ICD-10-CM | POA: Diagnosis present

## 2016-09-06 DIAGNOSIS — Z7902 Long term (current) use of antithrombotics/antiplatelets: Secondary | ICD-10-CM

## 2016-09-06 DIAGNOSIS — Z87891 Personal history of nicotine dependence: Secondary | ICD-10-CM

## 2016-09-06 DIAGNOSIS — R079 Chest pain, unspecified: Secondary | ICD-10-CM

## 2016-09-06 DIAGNOSIS — I959 Hypotension, unspecified: Secondary | ICD-10-CM | POA: Diagnosis not present

## 2016-09-06 DIAGNOSIS — J942 Hemothorax: Secondary | ICD-10-CM | POA: Diagnosis not present

## 2016-09-06 DIAGNOSIS — D62 Acute posthemorrhagic anemia: Secondary | ICD-10-CM | POA: Diagnosis not present

## 2016-09-06 DIAGNOSIS — I2510 Atherosclerotic heart disease of native coronary artery without angina pectoris: Secondary | ICD-10-CM

## 2016-09-06 LAB — DRUG SCREEN, HIGH OPIATE CUTOFF, NO CONFIRMATION, URINE
AMPHETAMINES URINE: NEGATIVE
BARBITURATES URINE: NEGATIVE
BENZODIAZEPINES URINE: NEGATIVE
BUPRENORPHINE URINE: NEGATIVE
CANNABINOIDS URINE: NEGATIVE
COCAINE METABOLITES URINE: NEGATIVE
CREATININE RANDOM URINE: 142 mg/dL
ECSTASY/MDMA URINE: NEGATIVE
METHADONE URINE: NEGATIVE
OPIATES URINE (HIGH CUTOFF): NEGATIVE
OXYCODONE URINE: NEGATIVE

## 2016-09-06 LAB — CBC WITH DIFF
BASOPHIL #: 0.04 x10ˆ3/uL (ref 0.00–0.20)
BASOPHIL %: 1 %
EOSINOPHIL #: 0.2 x10ˆ3/uL (ref 0.00–0.50)
EOSINOPHIL %: 3 %
HCT: 44.4 % (ref 36.7–47.0)
HGB: 15.2 g/dL (ref 12.5–16.3)
LYMPHOCYTE #: 2.14 x10ˆ3/uL (ref 1.00–4.80)
LYMPHOCYTE %: 36 %
MCH: 29.7 pg (ref 27.4–33.0)
MCH: 29.7 pg (ref 27.4–33.0)
MCHC: 34.3 g/dL (ref 32.5–35.8)
MCHC: 34.3 g/dL (ref 32.5–35.8)
MCV: 86.6 fL (ref 78.0–100.0)
MONOCYTE #: 0.87 x10ˆ3/uL (ref 0.30–1.00)
MONOCYTE %: 15 %
MPV: 8.9 fL (ref 7.5–11.5)
NEUTROPHIL #: 2.64 x10ˆ3/uL (ref 1.50–7.70)
NEUTROPHIL %: 45 %
PLATELETS: 209 x10ˆ3/uL (ref 140–450)
RBC: 5.13 x10ˆ6/uL (ref 4.06–5.63)
RDW: 13.2 % (ref 12.0–15.0)
WBC: 5.9 x10ˆ3/uL (ref 3.5–11.0)

## 2016-09-06 LAB — TROPONIN-I: TROPONIN I: 8 ng/L (ref 0–30)

## 2016-09-06 LAB — CBC
HCT: 44.6 % (ref 36.7–47.0)
HGB: 15.3 g/dL (ref 12.5–16.3)
HGB: 15.3 g/dL (ref 12.5–16.3)
MCH: 29.2 pg (ref 27.4–33.0)
MCHC: 34.2 g/dL (ref 32.5–35.8)
MCV: 85.5 fL (ref 78.0–100.0)
MCV: 85.5 fL (ref 78.0–100.0)
MPV: 8.6 fL (ref 7.5–11.5)
PLATELETS: 206 x10ˆ3/uL (ref 140–450)
RBC: 5.22 x10ˆ6/uL (ref 4.06–5.63)
RDW: 13 % (ref 12.0–15.0)
WBC: 6.1 x10ˆ3/uL (ref 3.5–11.0)

## 2016-09-06 LAB — BASIC METABOLIC PANEL
ANION GAP: 9 mmol/L (ref 4–13)
BUN/CREA RATIO: 13 (ref 6–22)
BUN: 11 mg/dL (ref 8–25)
CALCIUM: 8.7 mg/dL (ref 8.5–10.2)
CHLORIDE: 107 mmol/L (ref 96–111)
CO2 TOTAL: 24 mmol/L (ref 22–32)
CREATININE: 0.84 mg/dL (ref 0.62–1.27)
ESTIMATED GFR: >59 mL/min/1.73mˆ2 (ref >59)
GLUCOSE: 88 mg/dL (ref 65–139)
POTASSIUM: 3.4 mmol/L — ABNORMAL LOW (ref 3.5–5.1)
SODIUM: 140 mmol/L (ref 136–145)

## 2016-09-06 LAB — PT/INR
INR: 1.01 (ref 0.80–1.20)
PROTHROMBIN TIME: 11.7 s (ref 9.3–13.9)

## 2016-09-06 LAB — LIPID PANEL
CHOL/HDL RATIO: 3.5
CHOLESTEROL: 118 mg/dL (ref <200)
HDL CHOL: 34 mg/dL — ABNORMAL LOW (ref >=39)
LDL CALC: 49 mg/dL (ref <=100)
NON-HDL: 84 mg/dL (ref <=190)
TRIGLYCERIDES: 173 mg/dL — ABNORMAL HIGH (ref <=150)
VLDL CALC: 35 mg/dL — ABNORMAL HIGH (ref <30)

## 2016-09-06 LAB — PTT (PARTIAL THROMBOPLASTIN TIME)
APTT: 40.8 s — ABNORMAL HIGH (ref 25.1–36.5)
APTT: 38.6 s — ABNORMAL HIGH (ref 25.1–36.5)

## 2016-09-06 LAB — THYROID STIMULATING HORMONE WITH FREE T4 REFLEX: TSH: 1.199 u[IU]/mL (ref 0.350–5.000)

## 2016-09-06 LAB — B-TYPE NATRIURETIC PEPTIDE (BNP),PLASMA: BNP: 13 pg/mL (ref <=100)

## 2016-09-06 LAB — PHOSPHORUS: PHOSPHORUS: 3 mg/dL (ref 2.4–4.7)

## 2016-09-06 LAB — MAGNESIUM: MAGNESIUM: 2.2 mg/dL (ref 1.6–2.5)

## 2016-09-06 MED ORDER — FLUTICASONE PROPIONATE 50 MCG/ACTUATION NASAL SPRAY,SUSPENSION
1.0000 | Freq: Every day | NASAL | Status: DC
Start: 2016-09-07 — End: 2016-09-16
  Administered 2016-09-07 – 2016-09-08 (×2): 1 via NASAL
  Administered 2016-09-09 – 2016-09-12 (×4): 0 via NASAL
  Administered 2016-09-13: 1 via NASAL
  Administered 2016-09-14: 0 via NASAL
  Administered 2016-09-15: 1 via NASAL
  Administered 2016-09-16: 0 via NASAL
  Filled 2016-09-06 (×2): qty 16

## 2016-09-06 MED ORDER — IPRATROPIUM 0.5 MG-ALBUTEROL 3 MG (2.5 MG BASE)/3 ML NEBULIZATION SOLN
3.00 mL | INHALATION_SOLUTION | Freq: Four times a day (QID) | RESPIRATORY_TRACT | Status: DC | PRN
Start: 2016-09-06 — End: 2016-09-09
  Administered 2016-09-07: 3 mL via RESPIRATORY_TRACT

## 2016-09-06 MED ORDER — FAMOTIDINE 20 MG TABLET
20.0000 mg | ORAL_TABLET | Freq: Every day | ORAL | Status: DC
Start: 2016-09-07 — End: 2016-09-09
  Administered 2016-09-07 – 2016-09-08 (×3): 20 mg via ORAL
  Filled 2016-09-06 (×2): qty 1

## 2016-09-06 MED ORDER — AMLODIPINE 5 MG TABLET
5.0000 mg | ORAL_TABLET | Freq: Every day | ORAL | Status: DC
Start: 2016-09-07 — End: 2016-09-09
  Administered 2016-09-07 – 2016-09-08 (×2): 5 mg via ORAL
  Administered 2016-09-09: 0 mg via ORAL
  Filled 2016-09-06 (×3): qty 1

## 2016-09-06 MED ORDER — NITROGLYCERIN 0.4 MG/HR TRANSDERMAL 24 HOUR PATCH
0.4000 mg | MEDICATED_PATCH | Freq: Every day | TRANSDERMAL | Status: DC
Start: 2016-09-06 — End: 2016-09-07
  Administered 2016-09-06 – 2016-09-07 (×2): 0.4 mg via TRANSDERMAL
  Filled 2016-09-06: qty 1

## 2016-09-06 MED ORDER — CLOPIDOGREL 75 MG TABLET
75.0000 mg | ORAL_TABLET | Freq: Every day | ORAL | Status: DC
Start: 2016-09-07 — End: 2016-09-07
  Administered 2016-09-07: 75 mg via ORAL
  Filled 2016-09-06: qty 1

## 2016-09-06 MED ORDER — SODIUM CHLORIDE 0.9 % (FLUSH) INJECTION SYRINGE
2.0000 mL | INJECTION | Freq: Three times a day (TID) | INTRAMUSCULAR | Status: DC
Start: 2016-09-06 — End: 2016-09-16
  Administered 2016-09-06 – 2016-09-08 (×5): 2 mL
  Administered 2016-09-08: 0 mL
  Administered 2016-09-08 – 2016-09-11 (×8): 2 mL
  Administered 2016-09-11: 0 mL
  Administered 2016-09-11 – 2016-09-12 (×2): 2 mL
  Administered 2016-09-12: 0 mL
  Administered 2016-09-12 – 2016-09-16 (×11): 2 mL

## 2016-09-06 MED ORDER — MONTELUKAST 10 MG TABLET
10.00 mg | ORAL_TABLET | Freq: Every evening | ORAL | Status: DC
Start: 2016-09-06 — End: 2016-09-16
  Administered 2016-09-06 – 2016-09-09 (×4): 10 mg via ORAL
  Administered 2016-09-10: 0 mg via ORAL
  Administered 2016-09-11 – 2016-09-13 (×3): 10 mg via ORAL
  Administered 2016-09-14: 0 mg via ORAL
  Administered 2016-09-15: 10 mg via ORAL
  Filled 2016-09-06 (×12): qty 1

## 2016-09-06 MED ORDER — HEPARIN (PORCINE) 5,000 UNITS/ML BOLUS
5000.0000 [IU] | Freq: Once | INTRAMUSCULAR | Status: AC
Start: 2016-09-06 — End: 2016-09-06
  Administered 2016-09-06: 5000 [IU] via INTRAVENOUS
  Filled 2016-09-06: qty 1

## 2016-09-06 MED ORDER — METOPROLOL TARTRATE 25 MG TABLET
25.00 mg | ORAL_TABLET | Freq: Two times a day (BID) | ORAL | Status: DC
Start: 2016-09-06 — End: 2016-09-07
  Administered 2016-09-06 – 2016-09-07 (×2): 25 mg via ORAL
  Filled 2016-09-06 (×3): qty 1

## 2016-09-06 MED ORDER — IPRATROPIUM 0.5 MG-ALBUTEROL 3 MG (2.5 MG BASE)/3 ML NEBULIZATION SOLN
3.0000 mL | INHALATION_SOLUTION | Freq: Three times a day (TID) | RESPIRATORY_TRACT | Status: DC
Start: 2016-09-06 — End: 2016-09-06

## 2016-09-06 MED ORDER — LOSARTAN 50 MG TABLET
50.0000 mg | ORAL_TABLET | Freq: Every day | ORAL | Status: DC
Start: 2016-09-07 — End: 2016-09-07
  Administered 2016-09-07: 50 mg via ORAL
  Filled 2016-09-06: qty 1

## 2016-09-06 MED ORDER — SODIUM CHLORIDE 0.9 % (FLUSH) INJECTION SYRINGE
2.0000 mL | INJECTION | INTRAMUSCULAR | Status: DC | PRN
Start: 2016-09-06 — End: 2016-09-16

## 2016-09-06 MED ORDER — HEPARIN (PORCINE) 25,000 UNIT/250 ML (100 UNIT/ML) IN DEXTROSE 5 % IV
1000.0000 [IU]/h | INTRAVENOUS | Status: DC
Start: 2016-09-06 — End: 2016-09-09
  Administered 2016-09-06 – 2016-09-07 (×3): 1000 [IU]/h via INTRAVENOUS
  Administered 2016-09-07: 0 [IU]/h via INTRAVENOUS
  Administered 2016-09-07: 900 [IU]/h via INTRAVENOUS
  Administered 2016-09-07: 1000 [IU]/h via INTRAVENOUS
  Administered 2016-09-07 (×3): 900 [IU]/h via INTRAVENOUS
  Administered 2016-09-07: 1000 [IU]/h via INTRAVENOUS
  Administered 2016-09-07: 900 [IU]/h via INTRAVENOUS
  Administered 2016-09-07: 1000 [IU]/h via INTRAVENOUS
  Administered 2016-09-07 (×5): 900 [IU]/h via INTRAVENOUS
  Administered 2016-09-07: 1000 [IU]/h via INTRAVENOUS
  Administered 2016-09-07 (×3): 900 [IU]/h via INTRAVENOUS
  Administered 2016-09-07 (×2): 1000 [IU]/h via INTRAVENOUS
  Administered 2016-09-08 (×6): 1100 [IU]/h via INTRAVENOUS
  Administered 2016-09-08: 1300 [IU]/h via INTRAVENOUS
  Administered 2016-09-08 (×4): 1100 [IU]/h via INTRAVENOUS
  Administered 2016-09-08 (×3): 1300 [IU]/h via INTRAVENOUS
  Administered 2016-09-08 (×4): 1100 [IU]/h via INTRAVENOUS
  Administered 2016-09-09: 0 [IU]/h via INTRAVENOUS
  Administered 2016-09-09 (×3): 1300 [IU]/h via INTRAVENOUS
  Filled 2016-09-06 (×3): qty 25000

## 2016-09-06 MED ORDER — ATORVASTATIN 80 MG TABLET
80.00 mg | ORAL_TABLET | Freq: Every evening | ORAL | Status: DC
Start: 2016-09-06 — End: 2016-09-16
  Administered 2016-09-06 – 2016-09-09 (×4): 80 mg via ORAL
  Administered 2016-09-10: 0 mg via ORAL
  Administered 2016-09-11 – 2016-09-15 (×5): 80 mg via ORAL
  Filled 2016-09-06 (×12): qty 1

## 2016-09-06 NOTE — H&P (Signed)
Cardiology Gold Service  Admission History & Physical       [name] [name], 51 y.o. male  Date of Admission:  09/06/2016  Date of service: 09/06/2016  Date of Birth:      Information obtained from: Patient and [facility] records  Chief Complaint: Chest pain/unstable angina    History of Present Illness: [name] [name] is [name] 51 y.o. male with history of CAD S/P PCI - "1 stent placed 06/14/2010 in left lower descending artery, then 2 more stents placed in 01/2011 - one on each end of the 1st stent", then PTCA for in-stent restenoses in 09/2014 and 03/2015, asthma, hx tobacco use (stopped 1993), hx IVDU (clean since 1993).  [name] [name], [name] [name] "[name]" is [name] transfer from [facility], to which he presented this morning with chest, shoulder, arm, and neck pain that woke him from sleep at ~8:30am. Chest pain was left sided and radiated to the left jawline and arm. Also associated with some dizziness. The pain was resolved by nitro initially. As the day went on the pain remitted - he again took [name] nitro, this relieved but did not eliminate the chest pain. At this point he went to the hospital, where he received an additional nitro and AC with Lovenox 120mg  subQ. He reported that the last time he had symptoms like this (Chest pain, neg trop, neg ECG), he conveniently had an appointment with Dr. Marylen Ponto on the same day - this was when he received the aforementioned PTCA in 2016. At admission to [facility] he complained of 2/10 chest pain and mild nausea.     Cardiac Risk Factors:    Past cardiac history: Yes  Current/Recent Smoker (within 1 year): No  Hypertension:   (BP 140/90): Yes  Dyslipidemia: (Total Chol greater than 200/LDL greater than 130 mg/dL): Yes  Fam Hx of Premature CAD:(male less than 43yrs; male less than 66 yrs): No  Cerebrovascular Disease :  No  Peripheral Arterial Disease: No  Diabetes Mellitus: No  Currently On Dialysis:  No  Chronic Lung Disease:  Yes  Cardiac Arrest w/in 24 Hours: No  Prior history of MI:  Yes        Prior PCI: Yes: Most Recent PCI Date: 2011 and 2012  CAD Presentation:  Unstable angina in last 60 days 1. rest angina 2. New onset angina CCS III severity 3. Increasing angina to CCS III - despite antianginals  Anginal Classification w/in 2 Weeks:  CCS III  marked limitation of activity - comfortable at rest  Prior CABG: No  Prior Valve Surgery/Procedure:  No  Prior Heart Failure: No  Cardiomyopathy: No  LV Systolic Dysfunction:  No    Past Medical History:   Past Medical History:   Diagnosis Date   . Asthma    . Coronary artery disease    . CPAP (continuous positive airway pressure) dependence    . Esophageal reflux    . Hypercholesterolemia    . Hypertension    . Knee injury    . Sleep apnea           Past Surgical History:   Past Surgical History:   Procedure Laterality Date   . CORONARY ARTERY ANGIOPLASTY     . HX DENTAL EXTRACTION     . HX STENTING (ANY)  06/15/2011   . HX TONSIL AND ADENOIDECTOMY            Medications Prior to Admission:   Medications Prior to Admission     Prescriptions  ALBUTEROL SULFATE (PROAIR HFA INHL)    Take 1-2 Puffs by inhalation Every 6 hours as needed     amLODIPine (NORVASC) 5 mg Oral Tablet    Take 1 Tab (5 mg total) by mouth Once [name] day    atorvastatin (LIPITOR) 20 mg Oral Tablet    Take 1 Tab (20 mg total) by mouth Every evening    clopidogrel (PLAVIX) 75 mg Oral Tablet    Take 1 Tab (75 mg total) by mouth Once [name] day    fluticasone (FLONASE) 50 mcg/actuation Nasal Spray, Suspension    1 Spray by Each Nostril route Once [name] day    fluticasone-vilanterol 100-25 mcg/dose Inhalation Disk with Device    Take 1 INHALATION by inhalation Once [name] day     ipratropium-albuterol 0.5 mg-3 mg(2.5 mg base)/3 mL Solution for Nebulization    3 mL by Nebulization route Four times [name] day    losartan (COZAAR) 50 mg Oral Tablet    Take 1 Tab (50 mg total) by mouth Once [name] day    metoprolol (LOPRESSOR) 25 mg Oral Tablet    Take 1 Tab (25 mg total) by mouth Twice daily    montelukast (SINGULAIR) 10 mg  Oral Tablet    Take 10 mg by mouth Every evening    nitroglycerin (NITROSTAT) 0.4 mg Sublingual Tablet, Sublingual    0.4 mg by Sublingual route Every 5 minutes as needed for Chest pain for 3 doses over 15 minutes    ranitidine (ZANTAC) 150 mg Oral Tablet    Take 150 mg by mouth Twice daily         Allergies:   He is allergic to phenergan [promethazine].   Family History:   His family history is not on file.   Social History:  He  reports that he does not drink alcohol.  He  reports that he quit smoking about 24 years ago. He quit after 10.00 years of use. He has never used smokeless tobacco.  He  reports that he does not use illicit drugs.    Review of Systems  Constitutional: No fever, chills, fatigue, or unintentional weight loss/gain  HEENT: No congestion, rhinorrhea, or sore throat  CV: No chest pain, palpitations, or edema  Respiratory: No shortness of breath, cough, or wheezing  GI: No N/V/D or abdominal pain  GU: No dysuria or hematuria  Musculoskeletal: No myalgias or arthralgias  Skin: No rash or dryness  Psych: No depression or anxiety  Neuro: No weakness, paresthesias, or seizures  Endocrine: No polyuria, polydipsia, or temperature intolerance  Heme: No history of clots; no easy bleeding/bruising  Allergy: No history of anaphylaxis    Today's Physical Exam:   Filed Vitals:    09/06/16 1936   BP: (!) 125/98   Pulse: 75   Resp: 18   Temp: 36.6 C (97.9 F)   SpO2: 98%     General:  No apparent distress  Head:  Normocephalic and atraumatic  Eyes:  PERRLA, EOMI, sclera non-icteric   Mouth:  Mucous membranes moist  Neck:  Supple, trachea midline, no JVD  Lungs:  CTAB, no crackles or wheezing  Heart:  RRR, no murmurs appreciated  Abdomen:  Soft, nontender, nondistended. BS normal  Extremities:  No cyanosis or edema  Skin:  Warm and dry  Neurologic:  [name] x3, no focal deficits     Labs:  BMP   No results for input(s): SODIUM, POTASSIUM, CHLORIDE, CO2, BUN, CREATININE, GLUCOSENF, GLUCOSEFAST, ANIONGAP,  BUNCRRATIO, GFR in  the last 72 hours.     CBC   No results for input(s): WBC, HGB, HCT, PLTCNT, BANDS in the last 72 hours.    Invalid input(s): PLATELETCOUNT   Diff   No results for input(s): PMNS, LYMPHOCYTES, MYELOCYTES, MONOCYTES, EOSINOPHIL, BASOPHILS, NRBCS, PMNABS, LYMPHSABS, EOSABS, MONOSABS, BASOSABS in the last 72 hours.    Invalid input(s): PROMYELOCYTES, METAMYELOCYTES     Cardiac Labs   No results for input(s): TROPONINI, CKMB, CPK, MBINDEX, BNP in the last 72 hours.    Invalid input(s): CRMBKCALC     Thyroid Studies   No results for input(s): TSH, TSHCASCADE, T4, T4FREE, T3, FREET3 in the last 72 hours.    Invalid input(s): FTI, T3UPTAKE, THYROXINBDGL     LFTs   No results for input(s): AST, ALT, ALKPHOS, TOTBILIRUBIN, BILIRUBINCON, TOTALPROTEIN, ALBUMIN in the last 72 hours.No results for input(s): PREALBUMIN, LIPASE, UROBILINOGEN, GAMMAGT, LDH, AMYLASE, AMMONIA in the last 72 hours.    Invalid input(s): GGT      Blood Gas Studies   No results for input(s): SPECIMENTYPE, FI02, PH, PCO2, PO2, BICARBONATE, BASEEXCESS, BASEDEFICIT, PFRATIO in the last 72 hours.     Cardiac imaging and hemodynamics:    06/01/2016 TTE  1. The left ventricle is small. Normal left ventricular ejection fraction. LV Ejection Fraction is 61 %.  Concentric remodeling.  2. Total wall motion score is 1.00. There are no regional wall motion abnormalities.  3. RV Systolic Pressure is normal.  4. The left atrium is normal in size.  5. No mitral regurgitation seen.  6. No aortic regurgitation seen.    04/22/2016 MPS  1. 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  No regional ischemia.  2. Abnormal TID ratio of 1.24 suggests transient ischemic dilation of the LV due to multivessel ischemia.  3. Calculated LVEF of 52%.    03/27/2015 Cardiac Cath  1.  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  2.  Successful percutaneous transluminal coronary angioplasty of the left anterior descending stent.      Assessment and Plan:  [name] [name] is [name]  51 y.o. male with significant history of CAD S/P PCI 3 stents total between 2011-2012, then PTCA for in-stent restenoses in 2016, asthma, hx tobacco use (stopped 1993), hx IVDU (clean since 1993) who was transferred to the Cardiology service with unstable angina for cath in AM    Admit to cardiology gold service, attending Dr. Warnell Bureau, VS q4, POq4, +tele    Unstable Angina  CAD  - Chest pain awakening from sleep this AM, relieved by nitro  - Hx of CAD requiring intervention on multiple occasions  - Continued home meds ASA 81, Lopressor 25 BID, Plavix 75, Norvasc 5, Cozaar 50 (hx ACEi cough)  -- Increased Lipitor to 80mg  daily from 20mg  - would benefit from high intensity due to clinical evidence of coronary vasc disease  - Started on Heparin gtt - UA with high suspicion of coronary disease  - Nitro transdermal patch placed for current CP 2/10  - Plan for cath 3/21    Chronic Medical Conditions:  Asthma - Pulmicort for daily ICS; SABA and Duonebs PRN    Coronary artery disease therapies:  - ASA: yes  - P2Y12 inhibitor (Plavix, Brilinta, Effient): yes  - Statin: yes  - BB yes  - ACEi yes    Prophylaxis:   DVT/PE:  Heparin   GI: H2 blocker  Diet and Nutrition: npo for procedure  PT/OT: not indicated  Code status: Full    Disposition:  cath lab 3/21      Dione Housekeeper, MD  09/06/2016, 21:06  Resident Physician  [facility] Department of Family Medicine          Late entry for 09/06/16. I saw and examined the patient.  I reviewed the resident's note.  I agree with the findings and plan of care as documented in the resident's note.  Any exceptions/additions are edited/noted.    See other note for details    Brayton Layman, DO

## 2016-09-06 NOTE — Care Management Notes (Signed)
Dr. Milinda Caveickey connected with Dr. Sherryll BurgerSHah and patient was accepted with unstable angina from [facility]       QSOFA SCREENING    qSOFA Score >= 2 = Positive              Value               Score    Respiratory Rate >= 22 breaths per minute = 1 point 20 0   Systolic Blood Pressure <= 100 mmHg = 1 point 110 0   Altered Mental Status: GCS <15 = 1 point no 0   Total Score       Serum Lactate Level > = 564mmol/L (36 mg/ dL)= Positive:  Initial call level (if not available put NA) na   Followed up call level         Positive Screen: no      Ilsa IhaSarah Anais Denslow, RN

## 2016-09-06 NOTE — Nurses Notes (Signed)
Pt arrived via EMS from [facility] to 9SE-3. Pt oriented to staff, room, and unit routines. Belongings sheet reviewed by CA. Fall prevention education provided. VS and assessment per flowsheet. Admitting service paged.

## 2016-09-07 ENCOUNTER — Encounter (HOSPITAL_COMMUNITY)
Admission: EM | Disposition: A | Discharge status: Home Health | Payer: Self-pay | Source: Other Acute Inpatient Hospital | Attending: Specialist

## 2016-09-07 ENCOUNTER — Observation Stay (HOSPITAL_COMMUNITY): Payer: BC Managed Care – PPO

## 2016-09-07 DIAGNOSIS — E876 Hypokalemia: Secondary | ICD-10-CM | POA: Insufficient documentation

## 2016-09-07 DIAGNOSIS — Z95828 Presence of other vascular implants and grafts: Secondary | ICD-10-CM

## 2016-09-07 DIAGNOSIS — T82855A Stenosis of coronary artery stent, initial encounter: Principal | ICD-10-CM

## 2016-09-07 DIAGNOSIS — I200 Unstable angina: Secondary | ICD-10-CM

## 2016-09-07 DIAGNOSIS — I2510 Atherosclerotic heart disease of native coronary artery without angina pectoris: Secondary | ICD-10-CM

## 2016-09-07 DIAGNOSIS — I255 Ischemic cardiomyopathy: Secondary | ICD-10-CM

## 2016-09-07 LAB — TRANSTHORACIC ECHOCARDIOGRAM - ADULT
AV mean gradient: 0
AV peak velocity post stress: 0
AVA VTI: 0
AVA Vmax: 0
AVE E/e': 0
Biplane Simpson EF: 0
EF MEASUREMENT VALUE: 0
Interventricular Septum Diastolic Thickness by 2D: 0.9 cm
LA Volume Index: 29
LVIDD - 2D: 4.4 cm
LVIDS 2D: 3.2 cm
LVPWD: 0.8 cm
Lateral MV Annulus e': 0
MV E/A: 1
Medial MV Annulus e': 0
Please see Linked Document for Final Report.: ABNORMAL
Please see Linked Document for Final Report.: NORMAL
Please see Linked Document for Final Report.: NORMAL
RVSP: 0
TR VELOCITY: 192

## 2016-09-07 LAB — TROPONIN-I
TROPONIN I: <7 ng/L (ref 0–30)
TROPONIN I: <7 ng/L (ref 0–30)
TROPONIN I: <7 ng/L (ref 0–30)

## 2016-09-07 LAB — BASIC METABOLIC PANEL, FASTING
ANION GAP: 8 mmol/L (ref 4–13)
BUN/CREA RATIO: 12 (ref 6–22)
BUN: 11 mg/dL (ref 8–25)
CALCIUM: 8.2 mg/dL — ABNORMAL LOW (ref 8.5–10.2)
CHLORIDE: 106 mmol/L (ref 96–111)
CO2 TOTAL: 25 mmol/L (ref 22–32)
CREATININE: 0.91 mg/dL (ref 0.62–1.27)
ESTIMATED GFR: >59 mL/min/1.73mˆ2 (ref >59)
GLUCOSE: 100 mg/dL (ref 70–105)
POTASSIUM: 3.7 mmol/L (ref 3.5–5.1)
SODIUM: 139 mmol/L (ref 136–145)

## 2016-09-07 LAB — CBC
HCT: 43.8 % (ref 36.7–47.0)
HGB: 14.6 g/dL (ref 12.5–16.3)
MCH: 28.6 pg (ref 27.4–33.0)
MCHC: 33.3 g/dL (ref 32.5–35.8)
MCV: 85.8 fL (ref 78.0–100.0)
MPV: 8.8 fL (ref 7.5–11.5)
PLATELETS: 224 x10?3/uL (ref 140–450)
RBC: 5.1 x10ˆ6/uL (ref 4.06–5.63)
RDW: 13.4 % (ref 12.0–15.0)
WBC: 6.5 x10?3/uL (ref 3.5–11.0)

## 2016-09-07 LAB — MAGNESIUM: MAGNESIUM: 2.1 mg/dL (ref 1.6–2.5)

## 2016-09-07 LAB — PTT (PARTIAL THROMBOPLASTIN TIME): APTT: 84.2 s — ABNORMAL HIGH (ref 25.1–36.5)

## 2016-09-07 LAB — HGA1C (HEMOGLOBIN A1C WITH EST AVG GLUCOSE)
ESTIMATED AVERAGE GLUCOSE: 117 mg/dL
HEMOGLOBIN A1C: 5.7 % (ref 4.0–6.0)

## 2016-09-07 LAB — PHOSPHORUS: PHOSPHORUS: 4.3 mg/dL (ref 2.4–4.7)

## 2016-09-07 LAB — POC ACT CELITE (RESULTS): ACT CELITE, POC: <150 s

## 2016-09-07 SURGERY — CORONARY ANGIOGRAPHY W/LEFT HEART CATH W/WO LVG

## 2016-09-07 MED ORDER — LIDOCAINE HCL 20 MG/ML (2 %) INJECTION SOLUTION
Freq: Once | INTRAMUSCULAR | Status: DC | PRN
Start: 2016-09-07 — End: 2016-09-07
  Administered 2016-09-07: 2 mL via INTRADERMAL

## 2016-09-07 MED ORDER — HEPARIN (PORCINE) 1,000 UNIT/ML INJECTION SOLUTION
Freq: Once | INTRAMUSCULAR | Status: DC | PRN
Start: 2016-09-07 — End: 2016-09-07
  Administered 2016-09-07: 2000 [IU] via INTRA_ARTERIAL

## 2016-09-07 MED ORDER — POTASSIUM CHLORIDE ER 20 MEQ TABLET,EXTENDED RELEASE(PART/CRYST)
40.00 meq | ORAL_TABLET | ORAL | Status: AC
Start: 2016-09-07 — End: 2016-09-07
  Administered 2016-09-07: 40 meq via ORAL
  Filled 2016-09-07: qty 2

## 2016-09-07 MED ORDER — ASPIRIN 81 MG CHEWABLE TABLET
81.0000 mg | CHEWABLE_TABLET | Freq: Every day | ORAL | Status: DC
Start: 2016-09-07 — End: 2016-09-09
  Administered 2016-09-07 – 2016-09-08 (×3): 81 mg via ORAL
  Filled 2016-09-07 (×3): qty 1

## 2016-09-07 MED ORDER — METOPROLOL TARTRATE 50 MG TABLET
50.00 mg | ORAL_TABLET | Freq: Two times a day (BID) | ORAL | Status: DC
Start: 2016-09-07 — End: 2016-09-08
  Administered 2016-09-07 – 2016-09-08 (×2): 50 mg via ORAL
  Filled 2016-09-07 (×3): qty 1

## 2016-09-07 MED ORDER — IOPAMIDOL 300 MG IODINE/ML (61 %) INTRAVENOUS SOLUTION
INTRAVENOUS | Status: AC
Start: 2016-09-07 — End: 2016-09-07
  Filled 2016-09-07: qty 1

## 2016-09-07 MED ORDER — HEPARIN (PORCINE) (PF) 1,000 UNIT/500 ML IN 0.9 % SODIUM CHLORIDE IV
INTRAVENOUS | Status: AC
Start: 2016-09-07 — End: 2016-09-07
  Filled 2016-09-07: qty 1000

## 2016-09-07 MED ORDER — LIDOCAINE HCL 20 MG/ML (2 %) INJECTION SOLUTION
INTRAMUSCULAR | Status: AC
Start: 2016-09-07 — End: 2016-09-07
  Filled 2016-09-07: qty 20

## 2016-09-07 MED ORDER — VERAPAMIL 2.5 MG/ML INTRAVENOUS SOLUTION
Freq: Once | INTRAVENOUS | Status: DC | PRN
Start: 2016-09-07 — End: 2016-09-07
  Administered 2016-09-07: 5 mg via INTRA_ARTERIAL

## 2016-09-07 MED ORDER — FENTANYL (PF) 50 MCG/ML INJECTION SOLUTION
Freq: Once | INTRAMUSCULAR | Status: DC | PRN
Start: 2016-09-07 — End: 2016-09-07
  Administered 2016-09-07: 25 ug via INTRAVENOUS
  Administered 2016-09-07: 50 ug via INTRAVENOUS
  Administered 2016-09-07: 25 ug via INTRAVENOUS

## 2016-09-07 MED ORDER — BUDESONIDE 0.5 MG/2 ML SUSPENSION FOR NEBULIZATION
0.5000 mg | INHALATION_SUSPENSION | Freq: Two times a day (BID) | RESPIRATORY_TRACT | Status: DC
Start: 2016-09-07 — End: 2016-09-16
  Administered 2016-09-07: 0.5 mg via RESPIRATORY_TRACT
  Administered 2016-09-07: 0 mg via RESPIRATORY_TRACT
  Administered 2016-09-08 (×3): 0.5 mg via RESPIRATORY_TRACT
  Administered 2016-09-09 (×2): 0 mg via RESPIRATORY_TRACT
  Administered 2016-09-10 – 2016-09-16 (×13): 0.5 mg via RESPIRATORY_TRACT
  Filled 2016-09-07 (×5): qty 2
  Filled 2016-09-07: qty 4
  Filled 2016-09-07: qty 2
  Filled 2016-09-07: qty 4
  Filled 2016-09-07 (×6): qty 2
  Filled 2016-09-07: qty 4
  Filled 2016-09-07: qty 2

## 2016-09-07 MED ORDER — FENTANYL (PF) 50 MCG/ML INJECTION SOLUTION
INTRAMUSCULAR | Status: AC
Start: 2016-09-07 — End: 2016-09-07
  Filled 2016-09-07: qty 2

## 2016-09-07 MED ORDER — ONDANSETRON HCL (PF) 4 MG/2 ML INJECTION SOLUTION
4.00 mg | Freq: Three times a day (TID) | INTRAMUSCULAR | Status: DC | PRN
Start: 2016-09-07 — End: 2016-09-09
  Administered 2016-09-07: 4 mg via INTRAVENOUS
  Filled 2016-09-07 (×2): qty 2

## 2016-09-07 MED ORDER — ALBUTEROL SULFATE 2.5 MG/3 ML (0.083 %) SOLUTION FOR NEBULIZATION
2.50 mg | INHALATION_SOLUTION | RESPIRATORY_TRACT | Status: DC | PRN
Start: 2016-09-07 — End: 2016-09-09

## 2016-09-07 MED ORDER — MIDAZOLAM 1 MG/ML INJECTION SOLUTION
Freq: Once | INTRAMUSCULAR | Status: DC | PRN
Start: 2016-09-07 — End: 2016-09-07
  Administered 2016-09-07 (×4): 1 mg via INTRAVENOUS

## 2016-09-07 MED ORDER — HEPARIN (PORCINE) 1,000 UNIT/ML INJECTION SOLUTION
INTRAMUSCULAR | Status: AC
Start: 2016-09-07 — End: 2016-09-07
  Filled 2016-09-07: qty 10

## 2016-09-07 MED ORDER — IOPAMIDOL 300 MG IODINE/ML (61 %) INTRAVENOUS SOLUTION
Freq: Once | INTRAVENOUS | Status: DC | PRN
Start: 2016-09-07 — End: 2016-09-07
  Administered 2016-09-07: 13:00:00 55 mL via INTRA_ARTERIAL

## 2016-09-07 MED ORDER — ACETAMINOPHEN 325 MG TABLET
650.00 mg | ORAL_TABLET | ORAL | Status: AC
Start: 2016-09-07 — End: 2016-09-07
  Administered 2016-09-07: 650 mg via ORAL
  Filled 2016-09-07: qty 2

## 2016-09-07 MED ORDER — VERAPAMIL 2.5 MG/ML INTRAVENOUS SOLUTION
INTRAVENOUS | Status: AC
Start: 2016-09-07 — End: 2016-09-07
  Filled 2016-09-07: qty 2

## 2016-09-07 MED ORDER — NITROGLYCERIN 50 MG/250 ML (200 MCG/ML) IN 5 % DEXTROSE INTRAVENOUS
5.0000 ug/min | INTRAVENOUS | Status: DC
Start: 2016-09-07 — End: 2016-09-07
  Administered 2016-09-07 (×2): 0 ug/min via INTRAVENOUS
  Administered 2016-09-07: 5 ug/min via INTRAVENOUS
  Filled 2016-09-07: qty 250

## 2016-09-07 MED ORDER — PERFLUTREN LIPID MICROSPHERES 1.1 MG/ML INTRAVENOUS SUSPENSION
0.30 mL | INTRAVENOUS | Status: AC
Start: 2016-09-08 — End: 2016-09-07
  Administered 2016-09-07: 0.3 mL via INTRAVENOUS

## 2016-09-07 MED ORDER — MIDAZOLAM 1 MG/ML INJECTION SOLUTION
INTRAMUSCULAR | Status: AC
Start: 2016-09-07 — End: 2016-09-07
  Filled 2016-09-07: qty 4

## 2016-09-07 MED ORDER — IOPAMIDOL 300 MG IODINE/ML (61 %) INTRAVENOUS SOLUTION
INTRAVENOUS | Status: AC
Start: 2016-09-07 — End: 2016-09-07
  Filled 2016-09-07: qty 2

## 2016-09-07 MED ORDER — POTASSIUM CHLORIDE ER 20 MEQ TABLET,EXTENDED RELEASE(PART/CRYST)
40.00 meq | ORAL_TABLET | ORAL | Status: AC
Start: 2016-09-07 — End: 2016-09-07
  Administered 2016-09-07: 40 meq via ORAL
  Filled 2016-09-07 (×2): qty 2

## 2016-09-07 MED ADMIN — lanolin-oxyquin-pet, hydrophil topical ointment: INTRAVENOUS | @ 01:00:00 | NDC 09999989257

## 2016-09-07 MED ADMIN — midazolam 1 mg/mL injection solution: INTRAVENOUS | @ 12:00:00

## 2016-09-07 MED ADMIN — budesonide 0.5 mg/2 mL suspension for nebulization: RESPIRATORY_TRACT | @ 21:00:00

## 2016-09-07 MED ADMIN — heparin (porcine) 1,000 unit/mL injection solution: INTRA_ARTERIAL | @ 12:00:00

## 2016-09-07 MED ADMIN — nitroglycerin 50 mg/250 mL (200 mcg/mL) in 5 % dextrose intravenous: INTRAVENOUS | @ 10:00:00

## 2016-09-07 MED ADMIN — lactated Ringers intravenous solution: INTRAVENOUS | @ 10:00:00 | NDC 00338011704

## 2016-09-07 MED ADMIN — lactated Ringers intravenous solution: INTRAVENOUS | @ 18:00:00 | NDC 00338011704

## 2016-09-07 SURGICAL SUPPLY — 18 items
CATH ANGIO 5FR 145D PGTL CURVE 110CM EXPO FULL LGTH WRE BRD RBST SHAFT PERI CARDIAC TRILON (DIAGNOSTIC) ×1 IMPLANT
CATH ANGIO 5FR 145D PGTL CURVE_110CM EXPO FULL LGTH WRE BRD (DIAGNOSTIC) ×1
CATH ANGIO 5FR RADIAL TIG 4 CURVE 100CM OPTITORQUE LRG LUM SH 2 BRD SFT TIP COR SS NYL POLYUR (CARDIAC) ×1 IMPLANT
CATH ANGIO 5FR RADL TIG 4 CURV_E 100CM OPTITORQUE LRG LUM SH (CARDIAC) ×1
DEVICE COMPRESS TR BAND 24CM 2 BAL TRNSPR ADJ STRAP REG RADIAL ART (BALLOON) ×1 IMPLANT
DEVICE COMPRESS TR BAND 24CM 2_BAL TRNSPR ADJ STRAP AIR TRTN (BALLOON) ×1
DISCONTINUED NO SUB - GW .035IN 260CM FIX COR VAS 3MM J CURVE STRL LF  DISP ANGIO (WIRE) ×1 IMPLANT
GW .035IN 6CM 260CM STD TIP FI_COR EXCH WRE PTFE VAS 3MM (WIRE) ×1
GW GLDWR .018IN 3CM 150CM FLXB TAPER RADOPQ STD NITINOL HDRPH VAS PERI ANG (WIRE) ×1 IMPLANT
GW GLDWR .018IN 3CM 150CM FLX_B TAPER RADOPQ STD NITINOL (WIRE) ×1
GW GLDWR .035IN 3CM 180CM FLXB ATRAUMA TIP RADOPQ KINK RST NITINOL TUNG POLYUR HDRPH VAS 1.5MM J (WIRE) ×1 IMPLANT
GW GLDWR .035IN 3CM 180CM FLXB_ATRAUMA TIP RADOPQ KINK RST (WIRE) ×1
KIT ANGIO NAMIC MTS LHK STRL LF  DISP RNLD MEM HOSPITAL (MISCELLANEOUS PT CARE ITEMS) ×1 IMPLANT
KIT INTROD 10CM 6FR 22GA GLIDESHEATH SLNDR .021IN PLASTIC SHEATH DIL 2 WL PNCT SHORT ANG MINIWIRE 45 (GUIDING) ×1 IMPLANT
KIT INTROD 10CM 6FR 22GA GLIDE_SHEATH SLNDR .021IN PLASTIC (GUIDING) ×1
MANIFOLD CARIDAC 4V DYE SET (MISCELLANEOUS PT CARE ITEMS) ×1
SYRINGE ANGIO LF  MRK 7 ARTERION (NEEDLES & SYRINGE SUPPLIES) ×1 IMPLANT
SYRINGE ANGIO LF MRK 7 ARTERI_ON (NEEDLES & SYRINGE SUPPLIES) ×1

## 2016-09-07 NOTE — Nurses Notes (Signed)
Patient being taken to the cath lab. Still c/o intermittent chest pain. Report given to cath lab nurses at bedside.

## 2016-09-07 NOTE — Brief Op Note (Signed)
[facility]  Cardiac Catheterization Brief  Note    Name:  [name] [name]  MRN:  [id number]  Date of service:  09/07/2016    Access:   1. Right radial artery 6 Fr. Sheath    Complications: None    Findings:    1. 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  2. Minimal disease elsewhere.  3. Hypokinesis anterolateral and apical segments.  EF 45-50%    Recommendations:    1. Standard postprocedural care.  2. Continue heparin drip until revascularization 2 hours after TR band removal  3. Recommend cardiac surgery consult for robotic LIMA bypass evaluation.  4. Hold the patient's Plavix at the time being.  5. Cont NTG gtt.    Full note to follow    Sami Maryan RuedMohammed Aljohani, MD  Fellow, Division of Cardiology   [facility]  Pager # [phone]    Jodi GeraldsAnthony Roda-Renzelli, MD  09/07/2016, 13:09

## 2016-09-07 NOTE — Care Management Notes (Signed)
[facility] Management Initial Evaluation    Patient Name: [name] [name]  Date of Birth:   Sex: male  Date/Time of Admission: 09/06/2016  7:27 PM  Room/Bed: 03/A  Payor: BLUE CROSS BLUE SHIELD / Plan: [place] STATE BC/BS PPO / Product Type: PPO /   PCP: Biagio Quint, PA-C    Pharmacy Info:   Preferred Pharmacy     [facility] [address] [place] - [address] [place] [address]    Phone: [phone] Fax: [phone]    Not a 24 hour pharmacy; exact hours not known        Emergency Contact Info:   Extended Emergency Contact Information  Primary Emergency Contact: [name],[name]  Address: [address] [place], [place] [address] [place] Phone: [phone]  Work Phone: [phone]  Mobile Phone: [phone]  Relation: Wife    History:   [name] [name] is a 51 y.o., male, admitted with unstable angina    Height/Weight: 190.5 cm ('6\' 3"'$ ) / 122.7 kg (270 lb 8.1 oz)     LOS: 1 day   Admitting Diagnosis: Unstable angina (HCC) [I20.0]  Unstable angina ([place]) [I20.0]    Assessment:      09/07/16 1524   Assessment Details   Assessment Type Admission   Date of Care Management Update 09/07/16   Date of Next DCP Update 09/09/16   Care Management Plan   Discharge Planning Status initial meeting   Projected Discharge Date 09/12/16   Discharge Needs Assessment   Outpatient/Agency/Support Group Needs (Not active with any home health at present)   Equipment Currently Used at Home nebulizer;cpap   Equipment Needed After Discharge other (see comments)  (TBD)   Discharge Facility/Level Of Care Needs Home vs Home with Home Health   Transportation Available car;family or friend will provide   Referral Information   Admission Type observation   Address Verified verified-no changes   Arrived From acute hospital, other   [place] Other - See Comments  ([place] [facility])   Insurance Verified verified-no change   Observation Form   Observation Form  Given Non Medicare OBS form given   Non Medicare OBS form given to [name] [name]   Non Medicare OBS form date of delivery 09/07/16   Non Medicare OBS form time of delivery 1503   ADVANCE DIRECTIVES   Does the Patient have an Advance Directive? No, Information Offered and Given   Patient Requests Assistance in Having Advance Directive Notarized. (Pt given Advanced Directive packet to complete)   Employment/Financial   Patient has Prescription Coverage?  Yes       Name of Insurance Coverage for Medications Blue [employer] Concerns none   Living Environment   Select an age group to open "lives with" row.  Adult   Lives With spouse   Living Arrangements house   Able to Return to Prior Arrangements yes   Living Arrangement Comments 1 story home   Home Safety   Home Assessment: No Problems Identified   Home Accessibility no concerns;stairs to enter home   Living Environment   Number of Stairs to [place] 4     51 y.o. male with significant history of CAD S/P PCI 3 stents. S/p cath today showed stent restenosis. Medical presented at bedside during interview. Plan - LIMA bypass surgery this week - possibly  Friday.   CCC met with pt at bedside. Wife [name] [name] present. Pt lives with wife. Introduced self to pt and roles/responsbilities of Care Management dept. Demographcis verified. No changes. Insurance verified with script coverage. Uses [facility] in [place] for meds. Observation letter reviewed with pt. Pt may progress from Observation to Inpatient once surgery completed. CCC will follow for needs post surgery.     Discharge Plan:  Home vs home with [facility] for possible bypass surgery on Friday. Probable eval post surgery. CCC will follow.     The patient will continue to be evaluated for developing discharge needs.     Case Manager: [name] Baker, RN  Phone: [phone]

## 2016-09-07 NOTE — Care Management Notes (Signed)
CCC attempted to meet with pt at bedside. Pt in cath lab. CCC will follow post procedure.

## 2016-09-07 NOTE — Care Plan (Signed)
Problem: Patient Care Overview (Adult,OB)  Goal: Plan of Care Review(Adult,OB)  The patient and/or their representative will communicate an understanding of their plan of care   Outcome: Ongoing (see interventions/notes)  Discharge Plan:  Home vs home with Home Health  Pt for possible bypass surgery on Friday. Probable eval post surgery. CCC will follow.      The patient will continue to be evaluated for developing discharge needs

## 2016-09-07 NOTE — Nurses Notes (Signed)
Pt transferred to HVI pre/post via bed and nurse escort. Pt is complaining of 6/10 chest pain and nausea. 12 lead EKG in process.

## 2016-09-07 NOTE — Nurses Notes (Signed)
Patient returned from cath lab. VSS and cycling. Right tr band in place. Right arm positive cms. No complaints of chest pain at this time. Heparin and nitro gtts currently off.

## 2016-09-07 NOTE — Care Plan (Addendum)
Problem: Patient Care Overview (Adult,OB)  Goal: Plan of Care Review(Adult,OB)  The patient and/or their representative will communicate an understanding of their plan of care   Outcome: Ongoing (see interventions/notes)  Patient rested between care. VSS. C/o chest pain prior to cath, nitro gtt ordered, but was taken to cath lab before gtt could be started. Patient had left heart cath via right radial.LAD had severe in stent restenosis. Referred to cardiac surgery for evaluation for robotic lima bypass because this artery has had restenosis multiple times in the past. Heparin gtt will be restarted two hours after tr band has been removed. Patient not c/o chest pain post cath. Right radial site cdi. Right arm positive cms. Remained free of falls. Sitter select active and audible. Turns and repositions independently. Plan is for possible robotic bypass surgery.     Problem: Cardiac: ACS (Acute Coronary Syndrome) (Adult)  Prevent and manage potential problems including: 1. cardiovascular structural defects 2. chest pain (angina) 3. dysrhythmia/arrhythmia 4. embolism 5. heart failure/shock 6. ischemia leading to infarction 7. pericarditis 8. situational response   Goal: Signs and Symptoms of Listed Potential Problems Will be Absent, Minimized or Managed (Cardiac: ACS)  Signs and symptoms of listed potential problems will be absent, minimized or managed by discharge/transition of care (reference Cardiac: ACS (Acute Coronary Syndrome) (Adult) CPG).   Outcome: Ongoing (see interventions/notes)    Problem: Fall Risk (Adult)  Goal: Identify Related Risk Factors and Signs and Symptoms  Related risk factors and signs and symptoms are identified upon initiation of Human Response Clinical Practice Guideline (CPG).   Outcome: Ongoing (see interventions/notes)  Goal: Absence of Falls  Patient will demonstrate the desired outcomes by discharge/transition of care.   Outcome: Ongoing (see interventions/notes)

## 2016-09-07 NOTE — Nurses Notes (Signed)
Results for [name], Mikhai A (MRN [id number]) as of 09/07/2016 05:15   Ref. Range 09/07/2016 04:33   aPTT Latest Ref Range: 25.1 - 36.5 seconds 84.2 (H)     Per protocol, decreased Heparin drip rate to 900u/hr, next aPTT at 1100

## 2016-09-07 NOTE — Consults (Signed)
[facility]  CARDIAC SURGERY DEPARTMENTCONSULTATION     History and Physical    Date of Service:  09/07/2016  Depaul,[name] [name]  Encounter Start Date:  09/06/2016  Inpatient Admission Date:  09/06/2016  Admission Source: Home    Chief Complaint: chest pain    Primary Service: Cardiology Gold  Consult Requested By: Marylen Ponto  Primary Care Provider: Randol Kern, PA-C  Primary Cardiologist: Dr. Marylen Ponto    HPI: [name] [name] is [name] 51 y.o., White male with PMH of MI x 2, PCI x 3, PTCA who presented to [place] ED after experiencing chest pain not amenable to nitro. Cardiac surgery is consulted for possible MIDCAB for isolated LAD disease with EF 45-50%. [name] [name] states that he had chest pain that woke him from sleep. He reports no episodes of chest pain since his latest PTCA in 2016, but says it may be difficult to discern due to his asthma and indigestion. He was placed on [name] nitro gtt but this is since discontinued and he is not currently having chest pain. Troponins were negative and no evidence of ischemia on EKG. He his on Plavix with last dose on 09/06/16. 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 He has no significant family history, and is not [name] diabetic.      ROS: Other than ROS in the HPI, all other review of systems were negative except for: Respiratory: positive for asthma  Musculoskeletal: positive for knee surgery (scope meniscus repair)      Information Obtained from patient and spouse    Past Medical History:   Diagnosis Date   . Asthma    . Coronary artery disease    . CPAP (continuous positive airway pressure) dependence    . Esophageal reflux    . Hypercholesterolemia    . Hypertension    . Knee injury    . Sleep apnea            Risk Factors:    Family history of premature CAD (male < 41 or male < 27): No  Diabetes Mellitus  (HbA1C >6.5, Fasting >126 or Random glucose >200 with hyperglycemic symptoms):  No    Dyslipidemia: Yes  Renal  Disease: No  Hypertension: Yes  Chronic Lung Disease (asbestosis, mesothelioma, black lung, radiation induced pneumonitis/fibrosis, COPD, chronic bronchitis, emphysema, treated with chronic inhaled meds: Yes.  If yes, Severity Unknown, Type Obstructive, PFT completed within 1 year No  Home oxygen:No  Sleep Apnea: Yes   Pneumonia: No  History of or current Depression: No  Liver disease (chronic ETOH, Hep B, Hep C, cirrhosis, portal HTN, esophageal varices, congestive hepatopathy) : No  Immunocompromised at present (systemic steroids, chemo, anti-rejection meds): No  Mediastinal radiation: No  Cancer within 5 years (doesn't include basal cell or squamous cell CA): No  Peripheral artery disease (claudication, amputation, vascular reconstruction, aortic aneurysm. THIS DOES NOT include the carotid or cerebral vascular arteries or thoracic aneurysms): No    Thoracic Aorta Disease (history or current disease of the thoracic or thorcoabdominal aorta): No    Syncope (cardiac related within 1 year or surgery): No  Prior Cerebrovascular disease: No  History of previous carotid artery surgery and/or stenting: No  CSHA Clinical Frailty Scale: 3- Managing Well: Medical problems well controlled, but not regularly active beyond routine walking.  Five Meter Walk/ Six Minute Walk Performed: No.  Functional Disability: None  Electrolyte Imbalance: No  Protein-Calorie  Malnutrition: No  Coagulopathy: No  Current Sepsis: No  Contraindication for Perioperative Beta-Blocker: No    Cardiac Status:    Prior MI: Yes.  When: 2011  CAD presentation: Unstable angina (rest, increasing or new onset within 60 days)  Heart Failure: No  Cardiogenic Shock: No  Previous Arrhythmia : No  Prior Arrythmia surgery (MAZE or ablation): No  Prior CABG: No   Prior Valve Surgery: No  Prior PCI: Yes.   Previous congenital: No  Previous ICD: No  Previous Pacemaker: No  Other previous cardiovascular intervention: No  Cardiomyopathy: No  Porcelain Aorta: No     Transcatheter Procedure: No    Allergies   Allergen Reactions   . Phenergan [Promethazine] Seizure       Prior to Admission Medications:  Medications Prior to Admission     Prescriptions    ALBUTEROL SULFATE (PROAIR HFA INHL)    Take 1-2 Puffs by inhalation Every 6 hours as needed     amLODIPine (NORVASC) 5 mg Oral Tablet    Take 1 Tab (5 mg total) by mouth Once [name] day    atorvastatin (LIPITOR) 20 mg Oral Tablet    Take 1 Tab (20 mg total) by mouth Every evening    clopidogrel (PLAVIX) 75 mg Oral Tablet    Take 1 Tab (75 mg total) by mouth Once [name] day    fluticasone (FLONASE) 50 mcg/actuation Nasal Spray, Suspension    1 Spray by Each Nostril route Once [name] day    fluticasone-vilanterol 100-25 mcg/dose Inhalation Disk with Device    Take 1 INHALATION by inhalation Once [name] day     ipratropium-albuterol 0.5 mg-3 mg(2.5 mg base)/3 mL Solution for Nebulization    3 mL by Nebulization route Four times [name] day    losartan (COZAAR) 50 mg Oral Tablet    Take 1 Tab (50 mg total) by mouth Once [name] day    metoprolol (LOPRESSOR) 25 mg Oral Tablet    Take 1 Tab (25 mg total) by mouth Twice daily    montelukast (SINGULAIR) 10 mg Oral Tablet    Take 10 mg by mouth Every evening    nitroglycerin (NITROSTAT) 0.4 mg Sublingual Tablet, Sublingual    0.4 mg by Sublingual route Every 5 minutes as needed for Chest pain for 3 doses over 15 minutes    ranitidine (ZANTAC) 150 mg Oral Tablet    Take 150 mg by mouth Twice daily           Current Inpatient Medications:    Current Facility-Administered Medications:  albuterol (PROVENTIL) 2.5 mg / 3 mL (0.083%) neb solution 2.5 mg Nebulization Q4H PRN   amLODIPine (NORVASC) tablet 5 mg Oral Daily   aspirin chewable tablet 81 mg 81 mg Oral Daily   atorvastatin (LIPITOR) tablet 80 mg Oral QPM   budesonide (PULMICORT RESPULES) 0.5 mg/2 mL nebulizer suspension 0.5 mg Nebulization 2x/day   famotidine (PEPCID) tablet 20 mg Oral Daily   fluticasone (FLONASE) 50 mcg per spray nasal spray 1 Spray Each Nostril  Daily   heparin 25,000 units in D5W 250 mL infusion 1,000 Units/hr Intravenous Continuous   ipratropium-albuterol 0.5 mg-3 mg(2.5 mg base)/3 mL Solution for Nebulization 3 mL Nebulization 4x/day PRN   metoprolol tartrate (LOPRESSOR) tablet 50 mg Oral 2x/day   montelukast (SINGULAIR) 10 mg tablet 10 mg Oral QPM   nitroGLYCERIN 50 mg in 250 ml D5W premix infusion 5 mcg/min Intravenous Continuous   NS flush syringe 2 mL Intracatheter Q8HRS   And  NS flush syringe 2-6 mL Intracatheter Q1 MIN PRN   ondansetron (ZOFRAN) 2 mg/mL injection 4 mg Intravenous Q8H PRN       Past Surgical History:   Procedure Laterality Date   . CORONARY ARTERY ANGIOPLASTY     . HX DENTAL EXTRACTION     . HX STENTING (ANY)  06/15/2011   . HX TONSIL AND ADENOIDECTOMY         Past Family History: Father passed away from MI at age 50    Social History     Social History   . Marital status: Married     Spouse name: N/[name]   . Number of children: N/[name]   . Years of education: N/[name]     Social History Main Topics   . Smoking status: Former Smoker     Years: 10.00     Quit date: 04/20/1992   . Smokeless tobacco: Never Used   . Alcohol use No   . Drug use: No   . Sexual activity: Not on file     Other Topics Concern   . Not on file     Social History Narrative           Exam:   Temperature: 36.6 C (97.9 F)  Heart Rate: 71  BP (Non-Invasive): 111/70  Respiratory Rate: 18  SpO2-1: 92 %  Pain Score (Numeric, Faces): 0  Constitutional: moderately obese, appears older than stated age, no distress and vital signs reviewed  Eyes: Conjunctiva clear., Sclera non-icteric.   ENT: ENMT without erythema or injection, mucous membranes moist.  Neck: supple, symmetrical, trachea midline  Respiratory: Clear to auscultation bilaterally.   Cardiovascular: regular rate and rhythm, S1, S2 normal, no murmur, click, rub or gallop no edema  Gastrointestinal: Soft, non-tender, Bowel sounds normal, non-distended  Genitourinary: Deferred  Musculoskeletal: Head atraumatic and  normocephalic  Integumentary:  Skin warm and dry  Neurologic: Grossly normal, Alert and oriented x3  Psychiatric: Normal affect, behavior, memory, thought content, judgement, and speech.          Labs:  I have reviewed all lab results.  Creatinine = 0.91  Hgb = 14.6  Additional Labs Ordered: No    Diagnostic Tests:   Cardiac Catheterization Brief  Note       Name:  [name] [name]   MRN:  [id number]   Date of service:  09/07/2016     Access:    1. Right radial artery 6 Fr. Sheath     Complications: None     Findings:     1. 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   2. Minimal disease elsewhere.   3. Hypokinesis anterolateral and apical segments.  EF 45-50%     Recommendations:     1. Standard postprocedural care.   2. Continue heparin drip until revascularization 2 hours after TR band removal   3. Recommend cardiac surgery consult for robotic LIMA bypass evaluation.   4. Hold the patient's Plavix at the time being.   5. Cont NTG gtt.        TTE 09/07/16   Conclusion   Please see Linked Document for Final Report. Left Ventricle - Normal left ventricular size.LV Ejection Fraction is 55-60 %.Normal geom   etry.Left ventricular diastolic parameters were normal.   Resting Segmental Wall Motion [name] - Total wall motion score is 1.00. There are no regional    wall motion abnormalities.   Right Ventricle - Normal right ventricular size.Normal right ventricular systolic functio  n.RV Systolic Pressure is normal.   There is no significant valvular heart disease.             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  Patient seen and examined with Dr. Anette Riedel, who agrees that patient is an excellent candidate for MIDCAB. The benefits and risks of this operation were discussed with [name] [name] in detail. He wishes to proceed.   - P2Y12 ordered; procedure may be done on Plavix if necessary, but please continue to hold.   - Carotid Duplex due to early onset CAD.   - OR date TBD. Possible Friday or Monday.         Patient Active Problem List    Diagnosis Date Noted   . Hypokalemia 09/07/2016   . Unstable angina (HCC) 09/06/2016   . Chest pain 04/22/2016   . CAD (coronary artery disease) 01/25/2013       STS Risk Score [email or web address] : Calculation   CABG  Mortality = 0.4%  Morbidity/Mortality = 6.7%  Patient has decision making capacity:  yes  DNR Status:  Full Code    DVT RISK FACTORS HAVE BEEN ASSESSED AND PROPHYLAXIS ORDERED (SEE RUBYONLINE - REFERENCE TOOLS - MD, DVT PROPHY OR POCKET CARD):  YES           Maryjo Rochester, PA-C            Copies sent to Care Team       Relationship Specialty Notifications Start End    Randol Kern, [place] PCP - General EXTERNAL  03/07/13     Phone: [phone] Fax: [phone]         [facility] [address] [place] [address]

## 2016-09-07 NOTE — Care Management Notes (Signed)
Utilization Review Determination    SECTION I  Reason for Physician Advisor Referral: Does not meet inpatient criteria. 09/06/16    Current [facility] MD Order: inpatient    Utilization Review Findings: Patient meets observation status.  Utilization Review MD: Dr. Silvio ClaymanKaren Clark    Based on clinical information available to me as per above and in chart, the patient's status should be: Observation    Eden LatheKaren Deyonna Fitzsimmons, CLINICAL CARE COORDINATOR 09/07/2016, 08:39  -------------------------------------------------------------------------------------------------------------------  1.  I have reviewed this case with the patient's treating physician Cooper RenderHamed Haghnazar MD, and the MD agrees with this change in status.  They are aware of the referral to the Utilization Review Committee.    2.  The patient will be notified by Beverely PaceMichelle Smith DCP on 09/07/2016 of changes in level of care.  Please refer to the education documentation and/or Allscripts for specific time of delivery.    Eden LatheKaren Vernell Back, CLINICAL CARE COORDINATOR 09/07/2016, 08:39  (Patient notification is required only if the status is changed while an Inpatient to Observation Status)  -------------------------------------------------------------------------------------------------------------------

## 2016-09-07 NOTE — Sedation Documentation (Signed)
09/07/16  Procedure(s):  Coronary Angiography W/Left Heart Cath W/Wo Lvg    Diagnosis:     Sedation Informed Consent, pre-sedation risk assessment and evaluation completed.  History of previous adverse experiences with sedation/analgesia/anesthesia assessed.  Monitored conscious sedation was administered under my direct supervision by an appropriately trained sedation nurse.  Appropriate Facility and Equipment compliant.      Procedure time out  Timeouts     Steinmiller, Ricka BurdockSamantha Ann, RN [facility] Sep 07, 2016 1159     Timeout Details     Timeout type Pre-procedure          Procedures     Panel 1: CORONARY ANGIOGRAPHY with Jodi Geraldsoda-Renzelli, Dhanvi Boesen, MD          Timeout Questions     Correct patient? Yes    Correct site? Yes    Correct side? Yes    Correct position? Yes    Correct procedure? Yes    Site marked? Yes    H&P note completed? Yes    Consents verified? Yes    Radiology studies available? Yes    Relevant lab results available? Yes    Allergies reviewed? Yes    Are all required blood products & devices for the procedure available? Yes    Is documentation verified? Yes          Staff Present     Physicians Almustafa, Ahmed Waltham Rochesteriyadh, MD, Hanley HaysAljohani, Sami Mohammed, MD    Staff Steinmiller, Ricka BurdockSamantha Ann, RN, [place], Vicente SereneGabriel, [facility], Emmit Pomfretichter, Craig, [facility]          Verification History     Staff Performed Verified    Steinmiller, Ricka BurdockSamantha Ann, RN Wed Sep 07, 2016 1159 Wed Sep 07, 2016 1159                      Physician in  and out times  Physicians         Role Time In Time Out    Jodi Geraldsoda-Renzelli, Janiel Derhammer, MD Panel 1 Primary 09/07/2016 1201 09/07/2016 1230    Ailene ArdsAlmustafa, Ahmed Riyadh, MD Panel 1 Fellow 09/07/2016 1152 09/07/2016 1230    Hanley HaysAljohani, Sami Mohammed, MD Panel 1 Fellow 09/07/2016 1152 09/07/2016 1231      Sedation Staff       Type Time In Time Out    Steinmiller, Ricka BurdockSamantha Ann, RN Invasive Nurse 09/07/2016 1145           Sedation and Procedure Times:  Sedation Start Time:: 1159  Sedation End/Recovery Start Time: 1230     Proc  Name Event Type Event Time    CORONARY ANGIOGRAPHY W/LEFT HEART CATH W/WO LVG  Incision Start Wed Sep 07, 2016 12:01 PM    CORONARY ANGIOGRAPHY W/LEFT HEART CATH W/WO LVG  Incision Close Wed Sep 07, 2016 12:30 PM          Aldrete Scores    Pre Sedation  Activity: 2-->able to move 4 extremities voluntarily or on command  Respiration: 2-->able to breathe and cough freely  Circulation: 2-->BP within 20% of pre-anesthetic level  Consciousness: 2-->fully awake  O2 Saturation: 2-->able to maintain O2 saturation greater than 92% on room air  Dressing: 2-->dry and clean or not applicable  Pain: 2-->pain free  Ambulation: 2-->able to stand up and walk straight, on ordered bedrest, or performing at previous level of functioning  Fasting/Feeding: 2-->able to drink fluids or NPO, minimal nausea/ no vomiting  Urine Output: 2-->has voided, adequate urine output per device, or not applicable  Modified  Aldrete Score: 20      Post Sedation  Assessment Scored:: Post-Procedure  Activity: 2-->able to move 4 extremities voluntarily or on command  Respiration: 2-->able to breathe and cough freely  Circulation: 2-->BP within 20% of pre-anesthetic level  Consciousness: 1-->arousable on calling  O2 Saturation: 1-->needs O2 inhalation to maintain O2 saturation greater than 90%  Dressing: 2-->dry and clean or not applicable  Pain: 2-->pain free  Ambulation: 2-->able to stand up and walk straight, on ordered bedrest, or performing at previous level of functioning  Urine Output: 2-->has voided, adequate urine output per device, or not applicable  Post Modified Aldrete Score: 18      Sedation Type: Moderate Sedation     Medications (moderate): Versed, Fentanyl  [facility]ospital[facility]  Unit: HVI  IV Type: Peripheral IV  Additional Intervention needed:: No     Effects of Administration: Successful sedation w/o adverse events       Patient was continuously monitored throughout the procedure.  Provider was in attendance throughout sedation.  See  Invasive Procedure Log for additional details.    Sami Maryan Rued, MD             The patient was continuously monitored throughout the procedure and in recovery. I was in attendance and supervised the sedation (during the start and stop times listed above) and remained immediately available until the patient returned to pre-procedure baseline.       Jodi Geralds, MD  09/07/2016, 13:09

## 2016-09-07 NOTE — Care Plan (Addendum)
Problem: Patient Care Overview (Adult,OB)  Goal: Plan of Care Review(Adult,OB)  The patient and/or their representative will communicate an understanding of their plan of care   Outcome: Ongoing (see interventions/notes)  Patient was alert, oriented and conversant, complained of headache but declines any pain medication. Running NSR with HR 70-80 and BP 110-120/70-90. O2 given at 2lpm via NC due to desaturation episode of 86% when sleeping. Denies chestpain but with tolerable left arm pain. Heparin drip started and titrated based on Adult Cardiology protocol. Repositioned himself to sides independently. Fall precaution implemented. Advised on gradual change of position and ambulation and verbalizes understanding. NPO since MN. Consent signed for LHC. Prep/Clip completed. Potassium replacement given via PO. Plan to do LHC today with no schedule yet. Plan of care reviewed with patient.  Goal: Individualization/Patient Specific Goal(Adult/OB)  Outcome: Ongoing (see interventions/notes)  Goal: Interdisciplinary Rounds/Family Conf  Outcome: Ongoing (see interventions/notes)    Problem: Cardiac: ACS (Acute Coronary Syndrome) (Adult)  Prevent and manage potential problems including: 1. cardiovascular structural defects 2. chest pain (angina) 3. dysrhythmia/arrhythmia 4. embolism 5. heart failure/shock 6. ischemia leading to infarction 7. pericarditis 8. situational response   Goal: Signs and Symptoms of Listed Potential Problems Will be Absent, Minimized or Managed (Cardiac: ACS)  Signs and symptoms of listed potential problems will be absent, minimized or managed by discharge/transition of care (reference Cardiac: ACS (Acute Coronary Syndrome) (Adult) CPG).   Outcome: Ongoing (see interventions/notes)    Problem: Fall Risk (Adult)  Goal: Identify Related Risk Factors and Signs and Symptoms  Related risk factors and signs and symptoms are identified upon initiation of Human Response Clinical Practice Guideline (CPG).    Outcome: Ongoing (see interventions/notes)  Goal: Absence of Falls  Patient will demonstrate the desired outcomes by discharge/transition of care.   Outcome: Ongoing (see interventions/notes)

## 2016-09-07 NOTE — Nurses Notes (Signed)
0825Patient c/o 5/10 chest pressure radiating to left arm and shoulder, with left arm tingling. Also c/o nausea. VSS. Cardiology gold resident SuttonJustine paged, awaiting callback. 2l nc placed on patient.   0830 Patient now c/o 8/10 chest pressure. Resident placing orders for ekg, troponin, zofran, nitro gtt.

## 2016-09-07 NOTE — Progress Notes (Signed)
Cardiology Gold Service  Progress Note       [name],[name], 51 y.o. male  Date of Admission:  09/06/2016  Date of service: 09/07/2016  Date of Birth:      Chief Complaint/Reason for admission: [name] [name] is a 51 y.o. male with history of CAD S/P PCI - "1 stent placed 06/14/2010 in left lower descending artery, then 2 more stents placed in 01/2011 - one on each end of the 1st stent", then PTCA for in-stent restenoses in 09/2014 and 03/2015, asthma, hx tobacco use (stopped 1993), hx IVDU (clean since 1993). He presented the morning of admission with chest, shoulder, arm, and neck pain that woke him from sleep at ~8:30am, L sided CP, radiated to L jaw/arm, initially resolved with nitro but then came to the ER when it no longer provided relief. Complained of 2/10 CP and mild nausea on arrival.     Subjective/Overnight events: Reported 5/10 CP radiating to L arm this morning and associated with SOB. EKG, trop ordered, and starting nitro drip.     I/O:      Date 09/06/16 0700 - 09/07/16 0659 09/07/16 0700 - 09/08/16 0659   Shift 4098-1191 1500-2259 2300-0659 24 Hour Total 0700-1459 1500-2259 2300-0659 24 Hour Total   I  N  T  A  K  E   P.O.   0 0          Oral   0 0        I.V.  20  (0.02) 70 90          Med (IV) Flush Volume  10  10          Heparin Volume  10 70 80        Shift Total  (mL/kg)  20  (0.17) 70  (0.57) 90  (0.73)       O  U  T  P  U  T   Urine  300  (0.31) 450 750          Urine (Voided)  300 450 750        Shift Total  (mL/kg)  300  (2.49) 450  (3.67) 750  (6.11)       Weight (kg)  120.7 122.7 122.7 122.7 122.7 122.7 122.7       Daily Weights:  Wt Readings from Last 4 Encounters:   09/07/16 122.7 kg (270 lb 8.1 oz)   06/01/16 122 kg (268 lb 15.4 oz)   05/04/16 122 kg (268 lb 15.4 oz)   04/22/16 121.5 kg (267 lb 13.7 oz)        Inpatient medications:  I have reviewed inpatient medications.  Current Facility-Administered Medications   Medication Dose Route Frequency   . albuterol (PROVENTIL) 2.5 mg /  3 mL (0.083%) neb solution  2.5 mg Nebulization Q4H PRN   . amLODIPine (NORVASC) tablet  5 mg Oral Daily   . aspirin chewable tablet 81 mg  81 mg Oral Daily   . atorvastatin (LIPITOR) tablet  80 mg Oral QPM   . budesonide (PULMICORT RESPULES) 0.5 mg/2 mL nebulizer suspension  0.5 mg Nebulization 2x/day   . clopidogrel (PLAVIX) 75 mg tablet  75 mg Oral Daily   . famotidine (PEPCID) tablet  20 mg Oral Daily   . fluticasone (FLONASE) 50 mcg per spray nasal spray  1 Spray Each Nostril Daily   . heparin 25,000 units in D5W 250 mL infusion  1,000 Units/hr Intravenous Continuous   .  ipratropium-albuterol 0.5 mg-3 mg(2.5 mg base)/3 mL Solution for Nebulization  3 mL Nebulization 4x/day PRN   . losartan (COZAAR) tablet  50 mg Oral Daily   . metoprolol tartrate (LOPRESSOR) tablet  25 mg Oral 2x/day   . montelukast (SINGULAIR) 10 mg tablet  10 mg Oral QPM   . nitroGLYCERIN (NITRO-DUR) transdermal patch (mg/hr)  0.4 mg Transdermal Daily   . NS flush syringe  2 mL Intracatheter Q8HRS    And   . NS flush syringe  2-6 mL Intracatheter Q1 MIN PRN   . potassium chloride (K-DUR) extended release tablet  40 mEq Oral Now       Today's Physical Exam:   Filed Vitals:    09/06/16 1936 09/06/16 2344 09/07/16 0337 09/07/16 0427   BP: (!) 125/98 118/72 110/67    Pulse: 75 81 75    Resp: 18 18 18     Temp: 36.6 C (97.9 F) 36.6 C (97.9 F) 36.4 C (97.5 F)    SpO2: 98% 96% 96% 96%     General:  No apparent distress    Head:  Normocephalic and atraumatic  Eyes:  Pupils equak, sclera non-icteric   Mouth:  Mucous membranes moist  Neck:  Supple, trachea midline, no JVD  Lungs:  CTAB, no crackles or wheezing  Heart:  RRR, no murmurs appreciated  Abdomen:  Soft, nontender, nondistended.   Extremities:  No cyanosis or edema  Skin:  Warm and dry  Neurologic:  A&O      Labs:  I have reviewed all lab values and culture results.  Pertinent results are as below:  Results for orders placed or performed during the hospital encounter of 09/06/16 (from the  past 24 hour(s))   TROPONIN-I   Result Value Ref Range    TROPONIN I 8 0 - 30 ng/L   CBC/DIFF    Narrative    The following orders were created for panel order CBC/DIFF.  Procedure                               Abnormality         Status                     ---------                               -----------         ------                     CBC WITH DIFF[[id number]]                                    Final result                 Please view results for these tests on the individual orders.   B-TYPE NATRIURETIC PEPTIDE   Result Value Ref Range    BNP 13 <=100 pg/mL   BASIC METABOLIC PANEL   Result Value Ref Range    SODIUM 140 136 - 145 mmol/L    POTASSIUM 3.4 (L) 3.5 - 5.1 mmol/L    CHLORIDE 107 96 - 111 mmol/L    CO2 TOTAL 24 22 - 32 mmol/L    ANION GAP 9 4 - 13 mmol/L  CALCIUM 8.7 8.5 - 10.2 mg/dL    GLUCOSE 88 65 - 846 mg/dL    BUN 11 8 - 25 mg/dL    CREATININE 9.62 9.52 - 1.27 mg/dL    BUN/CREA RATIO 13 6 - 22    ESTIMATED GFR >59 >59 mL/min/1.70m^2   MAGNESIUM   Result Value Ref Range    MAGNESIUM 2.2 1.6 - 2.5 mg/dL   LIPID PANEL   Result Value Ref Range    CHOLESTEROL 118 <200 mg/dL    HDL CHOL 34 (L) >=84 mg/dL    TRIGLYCERIDES 132 (H) <=150 mg/dL    LDL CALC 49 <=440 mg/dL    VLDL CALC 35 (H) <10 mg/dL    NON-HDL 84 <=272 mg/dL    CHOL/HDL RATIO 3.5    THYROID STIMULATING HORMONE WITH FREE T4 REFLEX   Result Value Ref Range    TSH 1.199 0.350 - 5.000 uIU/mL   HGA1C (HEMOGLOBIN A1C WITH EST AVG GLUCOSE)   Result Value Ref Range    HEMOGLOBIN A1C 5.7 4.0 - 6.0 %    ESTIMATED AVERAGE GLUCOSE 117 mg/dL    Narrative    ADA 5366 recommendations suggest lowering A1C to below or around 7% to reduce microvascular and neuropathic complications of type 1 and type 2 diabetes.  There are no age/gender specific ranges for eAG.   PT/INR   Result Value Ref Range    PROTHROMBIN TIME 11.7 9.3 - 13.9 seconds    INR 1.01 0.80 - 1.20    Narrative    Coumadin therapy INR range for Conventional Anticoagulation is 2.0 to 3.0  and for Intensive Anticoagulation 2.5 to 3.5.   PTT (PARTIAL THROMBOPLASTIN TIME)   Result Value Ref Range    APTT 40.8 (H) 25.1 - 36.5 seconds    Narrative    Therapeutic range for unfractionated heparin is 60.0-100.0 seconds.   PHOSPHORUS   Result Value Ref Range    PHOSPHORUS 3.0 2.4 - 4.7 mg/dL   CBC WITH DIFF   Result Value Ref Range    WBC 5.9 3.5 - 11.0 x10^3/uL    RBC 5.13 4.06 - 5.63 x10^6/uL    HGB 15.2 12.5 - 16.3 g/dL    HCT 44.0 34.7 - 42.5 %    MCV 86.6 78.0 - 100.0 fL    MCH 29.7 27.4 - 33.0 pg    MCHC 34.3 32.5 - 35.8 g/dL    RDW 95.6 38.7 - 56.4 %    PLATELETS 209 140 - 450 x10^3/uL    MPV 8.9 7.5 - 11.5 fL    NEUTROPHIL % 45 %    LYMPHOCYTE % 36 %    MONOCYTE % 15 %    EOSINOPHIL % 3 %    BASOPHIL % 1 %    NEUTROPHIL # 2.64 1.50 - 7.70 x10^3/uL    LYMPHOCYTE # 2.14 1.00 - 4.80 x10^3/uL    MONOCYTE # 0.87 0.30 - 1.00 x10^3/uL    EOSINOPHIL # 0.20 0.00 - 0.50 x10^3/uL    BASOPHIL # 0.04 0.00 - 0.20 x10^3/uL   CBC   Result Value Ref Range    WBC 6.1 3.5 - 11.0 x10^3/uL    RBC 5.22 4.06 - 5.63 x10^6/uL    HGB 15.3 12.5 - 16.3 g/dL    HCT 33.2 95.1 - 88.4 %    MCV 85.5 78.0 - 100.0 fL    MCH 29.2 27.4 - 33.0 pg    MCHC 34.2 32.5 - 35.8 g/dL    RDW  13.0 12.0 - 15.0 %    PLATELETS 206 140 - 450 x10^3/uL    MPV 8.6 7.5 - 11.5 fL   PTT (PARTIAL THROMBOPLASTIN TIME)   Result Value Ref Range    APTT 38.6 (H) 25.1 - 36.5 seconds    Narrative    Therapeutic range for unfractionated heparin is 60.0-100.0 seconds.   DRUG SCREEN, HIGH OPIATE CUTOFF, NO CONFIRMATION, URINE   Result Value Ref Range    BUPRENORPHINE URINE Negative Negative    CANNABINOIDS URINE Negative Negative    COCAINE METABOLITES URINE Negative Negative    METHADONE URINE Negative Negative    OPIATES URINE (HIGH CUTOFF) Negative Negative    OXYCODONE URINE Negative Negative    ECSTASY/MDMA URINE Negative Negative    CREATININE RANDOM URINE 142 No Reference Range Established mg/dL    AMPHETAMINES URINE Negative Negative    BARBITURATES URINE  Negative Negative    BENZODIAZEPINES URINE Negative Negative   TROPONIN-I   Result Value Ref Range    TROPONIN I <7 0 - 30 ng/L   TROPONIN-I   Result Value Ref Range    TROPONIN I <7 0 - 30 ng/L   CBC   Result Value Ref Range    WBC 6.5 3.5 - 11.0 x10^3/uL    RBC 5.10 4.06 - 5.63 x10^6/uL    HGB 14.6 12.5 - 16.3 g/dL    HCT 16.143.8 09.636.7 - 04.547.0 %    MCV 85.8 78.0 - 100.0 fL    MCH 28.6 27.4 - 33.0 pg    MCHC 33.3 32.5 - 35.8 g/dL    RDW 40.913.4 81.112.0 - 91.415.0 %    PLATELETS 224 140 - 450 x10^3/uL    MPV 8.8 7.5 - 11.5 fL   PTT (PARTIAL THROMBOPLASTIN TIME)   Result Value Ref Range    APTT 84.2 (H) 25.1 - 36.5 seconds    Narrative    Therapeutic range for unfractionated heparin is 60.0-100.0 seconds.   BASIC METABOLIC PANEL, FASTING   Result Value Ref Range    SODIUM 139 136 - 145 mmol/L    POTASSIUM 3.7 3.5 - 5.1 mmol/L    CHLORIDE 106 96 - 111 mmol/L    CO2 TOTAL 25 22 - 32 mmol/L    ANION GAP 8 4 - 13 mmol/L    CALCIUM 8.2 (L) 8.5 - 10.2 mg/dL    GLUCOSE 782100 70 - 956105 mg/dL    BUN 11 8 - 25 mg/dL    CREATININE 2.130.91 0.860.62 - 1.27 mg/dL    BUN/CREA RATIO 12 6 - 22    ESTIMATED GFR >59 >59 mL/min/1.4559m^2   MAGNESIUM   Result Value Ref Range    MAGNESIUM 2.1 1.6 - 2.5 mg/dL   PHOSPHORUS   Result Value Ref Range    PHOSPHORUS 4.3 2.4 - 4.7 mg/dL         Imaging:   I have reviewed all imaging studies.      Cardiac imaging and hemodynamics:  06/01/2016 TTE  1. The left ventricle is small. Normal left ventricular ejection fraction. LV Ejection Fraction is 61 %.  Concentric remodeling.  2. Total wall motion score is 1.00. There are no regional wall motion abnormalities.  3. RV Systolic Pressure is normal.  4. The left atrium is normal in size.  5. No mitral regurgitation seen.  6. No aortic regurgitation seen.    04/22/2016 MPS  1. 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 No regional ischemia.  2. Abnormal TID ratio of 1.24 suggests transient  ischemic dilation of the LV due to multivessel ischemia.  3. Calculated LVEF of 52%.    03/27/2015 Cardiac  Cath  1. 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  2. Successful percutaneous transluminal coronary angioplasty of the left anterior descending stent.     TTE 3/21:   Left Ventricle - Normal left ventricular size.LV Ejection Fraction is 55-60 %.Normal geom   etry.Left ventricular diastolic parameters were normal.   Resting Segmental Wall Motion A - Total wall motion score is 1.00. There are no regional    wall motion abnormalities.   Right Ventricle - Normal right ventricular size.Normal right ventricular systolic functio   n.RV Systolic Pressure is normal.   There is no significant valvular heart disease.          3/21 heart cath: Findings:    1. 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  2. Minimal disease elsewhere.  3. Hypokinesis anterolateral and apical segments.  EF 45-50%      Assessment and Plan:      [name] [name] is a 51 y.o. male with history of CAD S/P PCI - "1 stent placed 06/14/2010 in left lower descending artery, then 2 more stents placed in 01/2011 - one on each end of the 1st stent", then PTCA for in-stent restenoses in 09/2014 and 03/2015, asthma, hx tobacco use (stopped 1993), hx IVDU (clean since 1993). He presented the morning of admission with chest, shoulder, arm, and neck pain that woke him from sleep at ~8:30am, L sided CP, radiated to L jaw/arm, initially resolved with nitro but then came to the ER when it no longer provided relief. Complained of 2/10 CP and mild nausea on arrival. Was transferred here for U/A and cath.      Unstable Angina  CAD  - Chest pain awakening from sleep morning of admission, initially relieved by nitro  - Hx of CAD requiring intervention on multiple occasions  - Trop were trended and negative and flat  - started nitro ggt this morning 3/21 due to active CP  - Continued home meds ASA 81, Lopressor 25 BID, Plavix 75, Norvasc 5, Cozaar 50 (hx ACEi cough; holding now 3/21); lopressor increased to 50 BID today 3/21; holding lasartan and plavix  now 3/21 post cath for CT surgery eval  - Increased Lipitor to 80mg  daily from 20mg    - Started on Heparin gtt per card protocol- UA with high suspicion of coronary disease  - sL PRN for CP  - s/p heart cath today 3/21: 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  - Restart hep ggt 2 hrs after TR band removed 3/21  - Will contact structural heart today 3/21 given heart cath findings  - TTE 3/21: LVEF 55-60%, no significant valvular disease  - continuous tele, pulse ox q4h    Asthma   - Pulmicort for daily ICS; SABA and Duonebs PRN  - flonase daily   - continue home singulair 10    Sleep Apnea  - continue home CPAP qpm    Hypertension  - continue home amlodipine 5  - continue home cozaar 50  - continue home lopressor 25    HLD  - Lipitor 20 at home; increased to 80 on admission    GERD  - Pepcid    Lytes  - K low, replace with Kdur, recheck and follow      Chronic Medical Conditions:  Asthma, CAD, former IVDU and tobacco use  Coronary artery disease therapies:  - ASA: yes  - P2Y12 inhibitor (Plavix, Brilinta, Effient): yes  - Statin: yes  - BB yes  - ACEi yes      Prophylaxis:   DVT/PE:  Heparin   GI: H2 blocker  Diet and Nutrition: diet for now  PT/OT: pending    Code status: full    Disposition: opending       Vaughan Basta, MD PGY-1 09/07/2016, 05:41  EM  [facility]      I saw and examined the patient.  I reviewed the resident's note.  I agree with the findings and plan of care as documented in the resident's note.  Any exceptions/additions are edited/noted.    mLAD ISR - CTS contacted for CABG evaluation.  TTE.  Increase BB.  Hold plavix.  Continue UFH ggt during interim to revascularization.    Brayton Layman, DO

## 2016-09-08 DIAGNOSIS — I10 Essential (primary) hypertension: Secondary | ICD-10-CM

## 2016-09-08 DIAGNOSIS — Z9861 Coronary angioplasty status: Secondary | ICD-10-CM

## 2016-09-08 DIAGNOSIS — T82855D Stenosis of coronary artery stent, subsequent encounter: Secondary | ICD-10-CM

## 2016-09-08 LAB — CBC WITH DIFF
BASOPHIL #: 0.06 x10ˆ3/uL (ref 0.00–0.20)
BASOPHIL #: 0.07 x10?3/uL (ref 0.00–0.20)
BASOPHIL %: 1 %
BASOPHIL %: 1 %
EOSINOPHIL #: 0.16 x10ˆ3/uL (ref 0.00–0.50)
EOSINOPHIL #: 0.19 x10ˆ3/uL (ref 0.00–0.50)
EOSINOPHIL %: 2 %
EOSINOPHIL %: 3 %
HCT: 45.3 % (ref 36.7–47.0)
HCT: 45.3 % (ref 36.7–47.0)
HCT: 46.7 % (ref 36.7–47.0)
HGB: 15.5 g/dL (ref 12.5–16.3)
HGB: 16.2 g/dL (ref 12.5–16.3)
LYMPHOCYTE #: 2.07 x10ˆ3/uL (ref 1.00–4.80)
LYMPHOCYTE #: 2.39 x10ˆ3/uL (ref 1.00–4.80)
LYMPHOCYTE %: 30 %
LYMPHOCYTE %: 32 %
MCH: 29.3 pg (ref 27.4–33.0)
MCH: 30.1 pg (ref 27.4–33.0)
MCHC: 34.2 g/dL (ref 32.5–35.8)
MCHC: 34.6 g/dL (ref 32.5–35.8)
MCV: 85.7 fL (ref 78.0–100.0)
MCV: 87 fL (ref 78.0–100.0)
MONOCYTE #: 0.89 x10ˆ3/uL (ref 0.30–1.00)
MONOCYTE #: 0.96 x10ˆ3/uL (ref 0.30–1.00)
MONOCYTE %: 13 %
MONOCYTE %: 13 %
MPV: 8.6 fL (ref 7.5–11.5)
MPV: 8.7 fL (ref 7.5–11.5)
NEUTROPHIL #: 3.79 x10ˆ3/uL (ref 1.50–7.70)
NEUTROPHIL #: 3.97 x10ˆ3/uL (ref 1.50–7.70)
NEUTROPHIL %: 54 %
NEUTROPHIL %: 52 %
PLATELETS: 210 x10ˆ3/uL (ref 140–450)
PLATELETS: 210 x10ˆ3/uL (ref 140–450)
RBC: 5.29 x10ˆ6/uL (ref 4.06–5.63)
RBC: 5.37 x10ˆ6/uL (ref 4.06–5.63)
RDW: 13 % (ref 12.0–15.0)
RDW: 13 % (ref 12.0–15.0)
WBC: 7 x10ˆ3/uL (ref 3.5–11.0)
WBC: 7.6 x10ˆ3/uL (ref 3.5–11.0)

## 2016-09-08 LAB — PHOSPHORUS
PHOSPHORUS: 3.2 mg/dL (ref 2.4–4.7)
PHOSPHORUS: 4 mg/dL (ref 2.4–4.7)

## 2016-09-08 LAB — BASIC METABOLIC PANEL
ANION GAP: 9 mmol/L (ref 4–13)
ANION GAP: 9 mmol/L (ref 4–13)
BUN/CREA RATIO: 12 (ref 6–22)
BUN/CREA RATIO: 11 (ref 6–22)
BUN: 12 mg/dL (ref 8–25)
BUN: 12 mg/dL (ref 8–25)
CALCIUM: 8.8 mg/dL (ref 8.5–10.2)
CALCIUM: 8.6 mg/dL (ref 8.5–10.2)
CHLORIDE: 106 mmol/L (ref 96–111)
CHLORIDE: 104 mmol/L (ref 96–111)
CO2 TOTAL: 23 mmol/L (ref 22–32)
CO2 TOTAL: 24 mmol/L (ref 22–32)
CREATININE: 0.97 mg/dL (ref 0.62–1.27)
CREATININE: 1.07 mg/dL (ref 0.62–1.27)
ESTIMATED GFR: >59 mL/min/1.73mˆ2 (ref >59)
ESTIMATED GFR: >59 mL/min/1.73mˆ2 (ref >59)
GLUCOSE: 97 mg/dL (ref 65–139)
GLUCOSE: 109 mg/dL (ref 65–139)
POTASSIUM: 4 mmol/L (ref 3.5–5.1)
POTASSIUM: 3.9 mmol/L (ref 3.5–5.1)
SODIUM: 138 mmol/L (ref 136–145)
SODIUM: 137 mmol/L (ref 136–145)

## 2016-09-08 LAB — ECG 12-LEAD
Atrial Rate: 67 {beats}/min
Atrial Rate: 68 {beats}/min
Calculated P Axis: 62 degrees
Calculated P Axis: 35 degrees
Calculated R Axis: 39 degrees
Calculated T Axis: 33 degrees
Calculated T Axis: 55 degrees
PR Interval: 192 ms
PR Interval: 178 ms
QRS Duration: 98 ms
QRS Duration: 92 ms
QT Interval: 386 ms
QT Interval: 378 ms
QTC Calculation: 407 ms
QTC Calculation: 401 ms
Ventricular rate: 67 {beats}/min
Ventricular rate: 68 {beats}/min

## 2016-09-08 LAB — PT/INR
INR: 0.92 (ref 0.80–1.20)
PROTHROMBIN TIME: 10.7 s (ref 9.3–13.9)

## 2016-09-08 LAB — MAGNESIUM
MAGNESIUM: 1.9 mg/dL (ref 1.6–2.5)
MAGNESIUM: 2.2 mg/dL (ref 1.6–2.5)

## 2016-09-08 LAB — PTT (PARTIAL THROMBOPLASTIN TIME)
APTT: 43.6 s — ABNORMAL HIGH (ref 25.1–36.5)
APTT: 48.7 s — ABNORMAL HIGH (ref 25.1–36.5)
APTT: 50.2 s — ABNORMAL HIGH (ref 25.1–36.5)
APTT: 51.3 s — ABNORMAL HIGH (ref 25.1–36.5)
APTT: 53.2 s — ABNORMAL HIGH (ref 25.1–36.5)

## 2016-09-08 LAB — VERIFYNOW P2Y12 PRU: PRU Verifynow: 150 [PRU] — ABNORMAL LOW (ref 194–418)

## 2016-09-08 MED ORDER — CEFAZOLIN 3 GRAM/20 ML IN STERILE WATER INTRAVENOUS SYRINGE
3.0000 g | INJECTION | Freq: Once | INTRAVENOUS | Status: AC
Start: 2016-09-09 — End: 2016-09-09
  Administered 2016-09-09: 3 g via INTRAVENOUS
  Filled 2016-09-08: qty 20

## 2016-09-08 MED ORDER — METOPROLOL TARTRATE 25 MG TABLET
25.00 mg | ORAL_TABLET | ORAL | Status: AC
Start: 2016-09-08 — End: 2016-09-08
  Administered 2016-09-08: 25 mg via ORAL
  Filled 2016-09-08 (×2): qty 1

## 2016-09-08 MED ORDER — METOPROLOL TARTRATE 6.25 MG QUARTER TABLET
6.2500 mg | ORAL_TABLET | Freq: Once | ORAL | Status: DC | PRN
Start: 2016-09-08 — End: 2016-09-08

## 2016-09-08 MED ORDER — DEXTROSE 5 % IN WATER (D5W) INTRAVENOUS SOLUTION
2.0000 g | Freq: Once | INTRAVENOUS | Status: DC
Start: 2016-09-09 — End: 2016-09-08

## 2016-09-08 MED ORDER — POTASSIUM CHLORIDE 2 MEQ/ML MICROPLEGIA SOLUTION
1.0000 | Freq: Once | INTRAVENOUS | Status: DC
Start: 2016-09-09 — End: 2016-09-09
  Filled 2016-09-08: qty 60

## 2016-09-08 MED ORDER — SODIUM BICARBONATE 1 MEQ/ML (8.4 %) INTRAVENOUS SOLUTION
1.0000 | Freq: Once | INTRAVENOUS | Status: DC
Start: 2016-09-09 — End: 2016-09-09
  Filled 2016-09-08: qty 3.35

## 2016-09-08 MED ORDER — METOPROLOL TARTRATE 100 MG TABLET
100.00 mg | ORAL_TABLET | Freq: Two times a day (BID) | ORAL | Status: DC
Start: 2016-09-08 — End: 2016-09-09
  Administered 2016-09-08 – 2016-09-09 (×3): 0 mg via ORAL
  Filled 2016-09-08 (×3): qty 1

## 2016-09-08 MED ORDER — ACETAMINOPHEN 325 MG TABLET
650.00 mg | ORAL_TABLET | ORAL | Status: AC
Start: 2016-09-08 — End: 2016-09-08
  Administered 2016-09-08 (×2): 650 mg via ORAL
  Filled 2016-09-08: qty 2

## 2016-09-08 MED ORDER — SODIUM BICARBONATE 1 MEQ/ML (8.4 %) INTRAVENOUS SOLUTION
1.0000 | Freq: Once | Status: DC
Start: 2016-09-09 — End: 2016-09-09
  Filled 2016-09-08: qty 35.2

## 2016-09-08 MED ADMIN — amLODIPine 5 mg tablet: ORAL | @ 10:00:00

## 2016-09-08 MED ADMIN — THROMBIN 5,000 UNITS IN NS: ORAL | @ 10:00:00 | NDC 28400010541

## 2016-09-08 MED ADMIN — nystatin 100,000 unit/gram topical powder: INTRAVENOUS | @ 07:00:00 | NDC 00574200815

## 2016-09-08 MED ADMIN — sodium chloride 0.9 % (flush) injection syringe: ORAL | @ 22:00:00

## 2016-09-08 NOTE — Respiratory Therapy (Signed)
Pt did not tolerate CPAP last only had it on for less than 1 hour.  Currently on room air.

## 2016-09-08 NOTE — Nurses Notes (Signed)
Pt resting in bed awaiting lunch. States his wife will be here shortly. Educated pt on cardiac diet. Will continue to monitor.

## 2016-09-08 NOTE — Care Plan (Signed)
Problem: Patient Care Overview (Adult,OB)  Goal: Plan of Care Review(Adult,OB)  The patient and/or their representative will communicate an understanding of their plan of care   Outcome: Ongoing (see interventions/notes)  Pt surgery postponed from tomorrow to possibly Monday. Pt has remained chest pain free and shortness of breath.     Problem: Cardiac: ACS (Acute Coronary Syndrome) (Adult)  Prevent and manage potential problems including: 1. cardiovascular structural defects 2. chest pain (angina) 3. dysrhythmia/arrhythmia 4. embolism 5. heart failure/shock 6. ischemia leading to infarction 7. pericarditis 8. situational response   Goal: Signs and Symptoms of Listed Potential Problems Will be Absent, Minimized or Managed (Cardiac: ACS)  Signs and symptoms of listed potential problems will be absent, minimized or managed by discharge/transition of care (reference Cardiac: ACS (Acute Coronary Syndrome) (Adult) CPG).   Outcome: Ongoing (see interventions/notes)    Problem: Fall Risk (Adult)  Goal: Identify Related Risk Factors and Signs and Symptoms  Related risk factors and signs and symptoms are identified upon initiation of Human Response Clinical Practice Guideline (CPG).   Outcome: Completed Date Met:  09/08/16  Goal: Absence of Falls  Patient will demonstrate the desired outcomes by discharge/transition of care.   Outcome: Ongoing (see interventions/notes)

## 2016-09-08 NOTE — Consults (Signed)
[facility]  Division of Cardiothoracic Surgery      Date of Service: 09/08/2016    Name: [name] [name]  Sex: male  MRN: [id number]  Age: 51 y.o.  Service: CARDIOLOGY GOLD    Patient seen in consultation for CAD.  Planning for midCAB tomorrow.  Pre operative orders entered.  Clear liquid diet.  NPO after midnight.  Consent obtained and is in the chart.    Please place consult for cardiac surgery order.  Contact us with any questions or concerns.      Howell PringleKatherine Anne RidottLestitian, [place] JerseyPA-C  09/08/2016  12:10  Division of Cardiothoracic Surgery.

## 2016-09-08 NOTE — Nurses Notes (Signed)
Results for [name], Bakari A (MRN [id number]) as of 09/08/2016 20:40   Ref. Range 09/08/2016 19:52   aPTT Latest Ref Range: 25.1 - 36.5 seconds 48.7 (H)     Per protocol, Increased the Heparin drip at 1300. Will continue to monitor. Heparin drip will stop at 0245 because patient was scheduled for CABG at 0645 c/o NP Donnie.     2053: Called blood bank for verification of blood availability, per Donata ClayAmy Murray 2 units of blood available. Informed NP Lillia AbedLindsay about 2 units blood only that was type and screen and needs additional 2 units to make it 4 units,  but per NP Lillia AbedLindsay 2 units are enough.

## 2016-09-08 NOTE — Nurses Notes (Signed)
Pt resting in bed, denies chest pain or shortness of breath. Awaiting MD to round. Will continue to monitor.

## 2016-09-08 NOTE — Care Management Notes (Signed)
[facility] Management Note    Patient Name: [name] [name]  Date of Birth:   Sex: male  Date/Time of Admission: 09/06/2016  7:27 PM  Room/Bed: 03/A  Payor: BLUE CROSS BLUE SHIELD / Plan: [place] BC/BS PPO / Product Type: PPO /    LOS: 1 day   PCP: Biagio Quint, PA-C    Admitting Diagnosis:  Unstable angina ([place]) [I20.0]  Unstable angina ([place]) [I20.0]    Assessment:   MSW met with patient to provide information and forms (and offer opportunity to complete) advanced directives. Pt reports he completed advanced directives already earlier in day and no longer needed assistance with forms.     Discharge Plan:  Undetermined at this time      The patient will continue to be evaluated for developing discharge needs.     Case Manager: Truett Perna, MSW  Phone: [phone]

## 2016-09-08 NOTE — Care Plan (Addendum)
Problem: Patient Care Overview (Adult,OB)  Goal: Plan of Care Review(Adult,OB)  The patient and/or their representative will communicate an understanding of their plan of care   Outcome: Ongoing (see interventions/notes)  Patient was alert, oriented and pleasant, no complained of chest pain and SOB. Unable to tolerate her CPAP at night, O2 given at 2lpm via NC due to desaturation episodes when asleep. Running NSR with rate 70-90 and BP 120/70-90. Fall precaution and sitter select activated for safety. Heparin drip continued, currently running at 1100 units. Well rested throughout the night without acute issue. No hematoma, (+) 2 right radial pulse and (+) good capillary refill on his Right arm. Placed limb alert bracelet on his affected arm. Advised no BP and stick on his affected arm for 24 hours.  Emotional support provided. CABG workup to be completed. Plan to do LIMA robotic CABG on Friday or Monday.   Goal: Individualization/Patient Specific Goal(Adult/OB)  Outcome: Ongoing (see interventions/notes)  Goal: Interdisciplinary Rounds/Family Conf  Outcome: Ongoing (see interventions/notes)    Problem: Cardiac: ACS (Acute Coronary Syndrome) (Adult)  Prevent and manage potential problems including: 1. cardiovascular structural defects 2. chest pain (angina) 3. dysrhythmia/arrhythmia 4. embolism 5. heart failure/shock 6. ischemia leading to infarction 7. pericarditis 8. situational response   Goal: Signs and Symptoms of Listed Potential Problems Will be Absent, Minimized or Managed (Cardiac: ACS)  Signs and symptoms of listed potential problems will be absent, minimized or managed by discharge/transition of care (reference Cardiac: ACS (Acute Coronary Syndrome) (Adult) CPG).   Outcome: Ongoing (see interventions/notes)    Problem: Fall Risk (Adult)  Goal: Identify Related Risk Factors and Signs and Symptoms  Related risk factors and signs and symptoms are identified upon initiation of Human Response Clinical Practice  Guideline (CPG).   Outcome: Ongoing (see interventions/notes)  Goal: Absence of Falls  Patient will demonstrate the desired outcomes by discharge/transition of care.   Outcome: Ongoing (see interventions/notes)

## 2016-09-08 NOTE — Progress Notes (Signed)
Cardiology Gold Service  Progress Note       [name] [name],[name] [name], 51 y.o. male  Date of Admission:  09/06/2016  Date of service: 09/08/2016  Date of Birth:      Chief Complaint/Reason for admission: [name] [name] is [name] 51 y.o. male with history of CAD S/P PCI - "1 stent placed 06/14/2010 in left lower descending artery, then 2 more stents placed in 01/2011 - one on each end of the 1st stent", then PTCA for in-stent restenoses in 09/2014 and 03/2015, asthma, hx tobacco use (stopped 1993), hx IVDU (clean since 1993). He presented the morning of admission with chest, shoulder, arm, and neck pain that woke him from sleep at ~8:30am, L sided CP, radiated to L jaw/arm, initially resolved with nitro but then came to the ER when it no longer provided relief. Complained of 2/10 CP and mild nausea on arrival.     Subjective/Overnight events: Denies CP or SOB.    I/O:      Date 09/07/16 0700 - 09/08/16 0659 09/08/16 0700 - 09/09/16 0659   Shift 4696-2952 1500-2259 2300-0659 24 Hour Total 0700-1459 1500-2259 2300-0659 24 Hour Total   I  N  T  [name]  K  E   P.O. 0 300 250 550          Oral 0 300 250 550        I.V.  (mL/kg/hr)  32  (0.03) 86 118          Med (IV) Flush Volume  5  5          Heparin Volume  27 86 113        Shift Total  (mL/kg) 0  (0) 332  (2.71) 336  (2.76) 668  (5.49)       O  U  T  P  U  T   Urine  (mL/kg/hr) 0  (0) 400  (0.41) 0 400          Urine (Voided) 0 400 0 400          Urine Occurrence  1 x  1 x        Stool              Stool Occurrence  1 x  1 x        Shift Total  (mL/kg) 0  (0) 400  (3.26) 0  (0) 400  (3.29)       Weight (kg) 122.7 122.7 121.6 121.6 121.6 121.6 121.6 121.6       Daily Weights:  Wt Readings from Last 4 Encounters:   09/08/16 121.6 kg (268 lb 1.3 oz)   06/01/16 122 kg (268 lb 15.4 oz)   05/04/16 122 kg (268 lb 15.4 oz)   04/22/16 121.5 kg (267 lb 13.7 oz)        Inpatient medications:  I have reviewed inpatient medications.  Current Facility-Administered Medications   Medication Dose  Route Frequency   . albuterol (PROVENTIL) 2.5 mg / 3 mL (0.083%) neb solution  2.5 mg Nebulization Q4H PRN   . amLODIPine (NORVASC) tablet  5 mg Oral Daily   . aspirin chewable tablet 81 mg  81 mg Oral Daily   . atorvastatin (LIPITOR) tablet  80 mg Oral QPM   . budesonide (PULMICORT RESPULES) 0.5 mg/2 mL nebulizer suspension  0.5 mg Nebulization 2x/day   . famotidine (PEPCID) tablet  20 mg Oral Daily   . fluticasone (FLONASE) 50 mcg per spray  nasal spray  1 Spray Each Nostril Daily   . heparin 25,000 units in D5W 250 mL infusion  1,000 Units/hr Intravenous Continuous   . ipratropium-albuterol 0.5 mg-3 mg(2.5 mg base)/3 mL Solution for Nebulization  3 mL Nebulization 4x/day PRN   . metoprolol tartrate (LOPRESSOR) tablet  50 mg Oral 2x/day   . montelukast (SINGULAIR) 10 mg tablet  10 mg Oral QPM   . NS flush syringe  2 mL Intracatheter Q8HRS    And   . NS flush syringe  2-6 mL Intracatheter Q1 MIN PRN   . ondansetron (ZOFRAN) 2 mg/mL injection  4 mg Intravenous Q8H PRN       Today's Physical Exam:   Filed Vitals:    09/07/16 2106 09/07/16 2120 09/07/16 2356 09/08/16 0336   BP: 120/72  128/84 118/90   Pulse: 85  69 73   Resp:   18 18   Temp:   36.8 C (98.2 F) 37.2 C (99 F)   SpO2: 95% 94% 95% 95%     General:  No apparent distress     Head:  Normocephalic and atraumatic  Eyes:  Pupils equak, sclera non-icteric   Mouth:  Mucous membranes moist  Neck:  Supple, trachea midline, no JVD  Lungs:  CTAB, no crackles or wheezing. No pedal edema.  Heart:  RRR, no murmurs appreciated  Abdomen:  Soft, nontender, nondistended.   Extremities:  No cyanosis or edema  Skin:  Warm and dry  Neurologic:  [name]      Labs:  I have reviewed all lab values and culture results.  Pertinent results are as below:  Results for orders placed or performed during the hospital encounter of 09/06/16 (from the past 24 hour(s))   TROPONIN-I   Result Value Ref Range    TROPONIN I <7 0 - 30 ng/L   PTT (PARTIAL THROMBOPLASTIN TIME)   Result Value Ref  Range    APTT 43.6 (H) 25.1 - 36.5 seconds    Narrative    Therapeutic range for unfractionated heparin is 60.0-100.0 seconds.   CBC/DIFF    Narrative    The following orders were created for panel order CBC/DIFF.  Procedure                               Abnormality         Status                     ---------                               -----------         ------                     CBC WITH VWUJ[[id number]]                                                                 Please view results for these tests on the individual orders.   POC ACT CELITE (RESULTS)   Result Value Ref Range    ACT CELITE, POC <150 74-125 sec (Prewarmed), 84-139 sec (Non-Prewarmed) sec  Imaging:   I have reviewed all imaging studies.      Cardiac imaging and hemodynamics:  06/01/2016 TTE  1. The left ventricle is small. Normal left ventricular ejection fraction. LV Ejection Fraction is 61 %.  Concentric remodeling.  2. Total wall motion score is 1.00. There are no regional wall motion abnormalities.  3. RV Systolic Pressure is normal.  4. The left atrium is normal in size.  5. No mitral regurgitation seen.  6. No aortic regurgitation seen.    04/22/2016 MPS  1. 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 No regional ischemia.  2. Abnormal TID ratio of 1.24 suggests transient ischemic dilation of the LV due to multivessel ischemia.  3. Calculated LVEF of 52%.    03/27/2015 Cardiac Cath  1. 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  2. Successful percutaneous transluminal coronary angioplasty of the left anterior descending stent.     TTE 3/21:   Left Ventricle - Normal left ventricular size.LV Ejection Fraction is 55-60 %.Normal geom   etry.Left ventricular diastolic parameters were normal.   Resting Segmental Wall Motion [name] - Total wall motion score is 1.00. There are no regional    wall motion abnormalities.   Right Ventricle - Normal right ventricular size.Normal right ventricular systolic functio   n.RV Systolic Pressure is  normal.   There is no significant valvular heart disease.          3/21 heart cath: Findings:    1. 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  2. Minimal disease elsewhere.  3. Hypokinesis anterolateral and apical segments.  EF 45-50%      Assessment and Plan:      [name] [name] is [name] 51 y.o. male with history of CAD S/P PCI - "1 stent placed 06/14/2010 in left lower descending artery, then 2 more stents placed in 01/2011 - one on each end of the 1st stent", then PTCA for in-stent restenoses in 09/2014 and 03/2015, asthma, hx tobacco use (stopped 1993), hx IVDU (clean since 1993). He presented the morning of admission with chest, shoulder, arm, and neck pain that woke him from sleep at ~8:30am, L sided CP, radiated to L jaw/arm, initially resolved with nitro but then came to the ER when it no longer provided relief. Complained of 2/10 CP and mild nausea on arrival. Was transferred here for U/[name] and cath.      Unstable Angina  CAD  - Chest pain awakening from sleep morning of admission, initially relieved by nitro  - Hx of CAD requiring intervention on multiple occasions  - Trop were trended and negative and flat  - started nitro ggt 3/21 due to active CP  - Continued home meds ASA 81, Lopressor 25 BID, Plavix 75, Norvasc 5, Cozaar 50 (hx ACEi cough; holding now 3/21); lopressor increased to 50 BID 3/21, increased to 100 BID on 3/22; holding lasartan and plavix now 3/21 post cath for CT surgery eval and prior to surgery   - Increased Lipitor to 80mg  daily from 20mg    - Started on Heparin gtt per card protocol- UA with high suspicion of coronary disease  - sL PRN for CP  - s/p heart cath 3/21: 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  - Restarted hep ggt 2 hrs after TR band removed 3/21   - structural heart/CT surg consult: recommend MIDCAB, continue hold Plavix, carotid duplex- complete 3/21, possible OR Friday or Monday, continue hep GGT until surgery  - TTE 3/21:  LVEF 55-60%, no  significant valvular disease  - continuous tele, pulse ox q4h    Asthma   - Pulmicort for daily ICS; SABA and Duonebs PRN  - flonase daily   - continue home singulair 10    Sleep Apnea  - continue home CPAP qpm    Hypertension  - continue home amlodipine 5  - continue home cozaar 50- holding for surgery  - continue home lopressor 25; increased to 100 BID 3/22    HLD  - Lipitor 20 at home; increased to 80 on admission    GERD  - Pepcid    Lytes  - replace as needed      Chronic Medical Conditions:  Asthma, CAD, former IVDU and tobacco use    Coronary artery disease therapies:  - ASA: yes  - P2Y12 inhibitor (Plavix, Brilinta, Effient): yes  - Statin: yes  - BB yes  - ACEi yes      Prophylaxis:   DVT/PE:  Heparin   GI: H2 blocker  Diet and Nutrition: diet for now  PT/OT: pending    Code status: full    Disposition: opending       Vaughan Basta, MD 09/08/2016, 05:28  PGY-1 Emergency Medicine  [facility]  [phone]      I saw and examined the patient.  I reviewed the resident's note.  I agree with the findings and plan of care as documented in the resident's note.  Any exceptions/additions are edited/noted.    Chest discomfort free.  Plan for minimally invasive LIMA tomorrow.    Brayton Layman, DO

## 2016-09-08 NOTE — Nurses Notes (Signed)
Results for [name], [name] [name] (MRN [id number]) as of 09/08/2016 00:13   Ref. Range 09/07/2016 23:37   aPTT Latest Ref Range: 25.1 - 36.5 seconds 43.6 (H)     Per protocol, Increased Heparin drip rate to 1100 unit, next aPTT at 0615. Will continue to monitor

## 2016-09-08 NOTE — Nurses Notes (Signed)
Patient was comfortable resting at bed, no complained at this time. Instructed to be NPO after midnight and verbalizes understanding.

## 2016-09-08 NOTE — Nurses Notes (Signed)
Pt resting in bed, consent signed for surgery. Plan for first case Mid CAB tomorrow. Will continue to monitor.

## 2016-09-08 NOTE — Nurses Notes (Signed)
Pt consented for anesthesiology for MID CAB tomorrow. Pt denies any questions at this time. Pt eager to have surgery and be able to go home. Will continue to monitor.

## 2016-09-08 NOTE — Nurses Notes (Signed)
Pt resting in bed, spouse went home for the evening, she is planning on being back in the am prior to him going to surgery. Will continue to monitor.

## 2016-09-08 NOTE — Nurses Notes (Signed)
Pt's surgery got changed from tomorrow to Monday. Pt aware and cooperative with plan. Pt denies pain or shortness of breath. Will continue to monitor.

## 2016-09-08 NOTE — Nurses Notes (Signed)
Results for [name], [place] A (MRN [id number]) as of 09/08/2016 06:49   Ref. Range 09/08/2016 06:09   aPTT Latest Ref Range: 25.1 - 36.5 seconds 53.2 (H)     Per protocol, No change done (1st therapeutic). Heparin drip currently running at 1100 units. Next aPTT at 1300

## 2016-09-08 NOTE — Anesthesia Preprocedure Evaluation (Signed)
ANESTHESIA PRE-OP EVALUATION  Planned Procedure: Bypass Graft Coronary Artery Mid Robotic (N/A Chest)  Harvest Vein Saphenous Endoscopic (N/A Leg)  Perfusion Charge/Stby/Cardiopulmonary Bypass (N/A )  Autologous Blood Conservation Setup (N/A )  Autologous Blood Conservation, Monitoring Per Hour (N/A )  Review of Systems         patient summary reviewed  nursing notes reviewed        Pulmonary   asthma and sleep apnea  Cardiovascular    Hypertension and CADNo peripheral edema       GI/Hepatic/Renal   GERD    Endo/Other         Neuro/Psych/MS    Cancer                                  Physical Assessment      Patient summary reviewed and Nursing notes reviewed   Airway       Mallampati: I    TM distance: >3 FB    Neck ROM: full  Mouth Opening: good.            Dental           (+) missing           Pulmonary      (+) decreased breath sounds present   (-) no wheezes     Cardiovascular    Rhythm: regular  Rate: Normal  (-) no friction rub, carotid bruit is not present, no peripheral edema and no murmur     Other findings            Plan  Planned anesthesia type: GETA and spinal    ASA 4     Intravenous induction   Arterial line and Central line  Anesthetic plan and risks discussed with patient.     Anesthesia issues/risks discussed are: Dental Injuries, Post-op Pain Management, Post-op Intubation/Ventilation, Spinal Headache, Difficult Airway, Stroke, Sore Throat, Intraoperative Awareness/ Recall, Aspiration, Post-op Agitation/Tantrum, Post-op Cognitive Dysfunction, Art Line Placement, Nerve Injuries, Eye /Visual Loss, [employer] Energy[employer] Line Placement, Blood Loss, Cardiac Events/MI and PONV.    Use of blood products discussed with patient whom consented to blood products.       NPO Status: Full stomach precautions.         Plan discussed with resident and attending.                 Charlann BoxerGrant A Jaaron Oleson, MD  09/08/2016, 12:37  Joylene IgoGrant Lynnie Koehler, M.D. PGY-3. CA-2  [facility] Department of Anesthesiology  Pager# [phone]

## 2016-09-08 NOTE — Nurses Notes (Signed)
Patient [name] [name] dose was increased to 100mg  tonight, Verified with NP [name] [name] about the tonight dose. Per NP [name] [name], held the Metoprolol 100mg  and will prescribed a low dose tonight. Will continue to monitor

## 2016-09-09 ENCOUNTER — Observation Stay (HOSPITAL_COMMUNITY): Payer: BC Managed Care – PPO

## 2016-09-09 ENCOUNTER — Inpatient Hospital Stay (HOSPITAL_COMMUNITY): Payer: BC Managed Care – PPO

## 2016-09-09 ENCOUNTER — Observation Stay (HOSPITAL_COMMUNITY): Payer: BC Managed Care – PPO | Admitting: ANESTHESIOLOGY

## 2016-09-09 ENCOUNTER — Encounter (HOSPITAL_COMMUNITY)
Admission: EM | Disposition: A | Discharge status: Home Health | Payer: Self-pay | Source: Other Acute Inpatient Hospital | Attending: Specialist

## 2016-09-09 DIAGNOSIS — I200 Unstable angina: Secondary | ICD-10-CM

## 2016-09-09 DIAGNOSIS — Z9889 Other specified postprocedural states: Secondary | ICD-10-CM

## 2016-09-09 DIAGNOSIS — T82855A Stenosis of coronary artery stent, initial encounter: Secondary | ICD-10-CM

## 2016-09-09 DIAGNOSIS — I25110 Atherosclerotic heart disease of native coronary artery with unstable angina pectoris: Secondary | ICD-10-CM

## 2016-09-09 DIAGNOSIS — J9811 Atelectasis: Secondary | ICD-10-CM

## 2016-09-09 DIAGNOSIS — J90 Pleural effusion, not elsewhere classified: Secondary | ICD-10-CM

## 2016-09-09 LAB — POCT ISTAT CG8 BLOOD GAS ELECTROLYTES HEMATOCRIT (RESULTS)
BASE EXCESS (BEP): -3 mmol/L — ABNORMAL LOW (ref ?–2.0)
BASE EXCESS (BEP): -14 mmol/L — ABNORMAL LOW (ref ?–2.0)
BASE EXCESS (BEP): 0 mmol/L (ref ?–2.0)
GLUCOSE, POC: 99 mg/dL (ref 70–105)
GLUCOSE, POC: 136 mg/dL — ABNORMAL HIGH (ref 70–105)
GLUCOSE, POC: 75 mg/dL (ref 70–105)
HCO3 (HCO3P): 22 mmol/L (ref 22–26)
HCO3 (HCO3P): 12 mmol/L — ABNORMAL LOW (ref 22–26)
HCO3 (HCO3P): 26 mmol/L (ref 22–26)
HEMATOCRIT, POC: 35 % — ABNORMAL LOW (ref 39.8–50.2)
HEMATOCRIT, POC: 20 % — ABNORMAL LOW (ref 39.8–50.2)
HEMATOCRIT, POC: 42 % (ref 39.8–50.2)
HEMOGLOBIN, POC: 11.9 g/dL (ref 11.5–16.5)
HEMOGLOBIN, POC: 14.3 g/dL (ref 11.5–16.5)
HEMOGLOBIN, POC: 6.8 g/dL — CL (ref 11.5–16.5)
IONIZED CALCIUM, POC: 1.02 mmol/L — ABNORMAL LOW (ref 1.30–1.46)
IONIZED CALCIUM, POC: 0.69 mmol/L — CL (ref 1.30–1.46)
IONIZED CALCIUM, POC: 0.69 mmol/L — CL (ref 1.30–1.46)
IONIZED CALCIUM, POC: 1.14 mmol/L — ABNORMAL LOW (ref 1.30–1.46)
PCO2 (PCO2P): 40 mmHg (ref 35–45)
PCO2 (PCO2P): 25 mmHg — CL (ref 35–45)
PCO2 (PCO2P): 49 mmHg — ABNORMAL HIGH (ref 35–45)
PH (PHP): 7.35 (ref 7.35–7.45)
PH (PHP): 7.3 — ABNORMAL LOW (ref 7.35–7.45)
PH (PHP): 7.33 — ABNORMAL LOW (ref 7.35–7.45)
PO2 (PO2P): 312 mmHg — ABNORMAL HIGH (ref 72–100)
PO2 (PO2P): 219 mmHg — ABNORMAL HIGH (ref 72–100)
PO2 (PO2P): 252 mmHg — ABNORMAL HIGH (ref 72–100)
POTASSIUM, POC: 3.1 mmol/L — ABNORMAL LOW (ref 3.5–5.0)
POTASSIUM, POC: 2 mmol/L — CL (ref 3.5–5.0)
POTASSIUM, POC: 4.4 mmol/L (ref 3.5–5.0)
SO2 (SO2P): 100 % (ref >=80)
SO2 (SO2P): 100 % (ref >=80)
SO2 (SO2P): 100 % (ref >=80)
SODIUM, POC: 144 mmol/L (ref 136–145)
SODIUM, POC: 138 mmol/L (ref 136–145)
SODIUM, POC: 150 mmol/L — ABNORMAL HIGH (ref 136–145)
TCO2 (TOC2P): 23 mmol/L (ref 23–33)
TCO2 (TOC2P): 13 mmol/L — ABNORMAL LOW (ref 23–33)
TCO2 (TOC2P): 28 mmol/L (ref 23–33)

## 2016-09-09 LAB — ARTERIAL BLOOD GAS/LACTATE/CO-OX/LYTES (NA/K/CA/CL/GLUC) - ORS ONLY
BASE DEFICIT: 4.9 mmol/L — ABNORMAL HIGH (ref 0.0–3.0)
BICARBONATE (ARTERIAL): 20.9 mmol/L (ref 18.0–26.0)
CARBOXYHEMOGLOBIN: 3 % — ABNORMAL HIGH (ref 0.0–2.5)
CHLORIDE: 104 mmol/L (ref 96–111)
GLUCOSE: 143 mg/dL — ABNORMAL HIGH (ref 60–105)
HEMOGLOBIN: 16.5 g/dL (ref 12.0–18.0)
IONIZED CALCIUM: 1.14 mmol/L (ref 1.10–1.30)
LACTATE: 1.3 mmol/L (ref 0.0–1.3)
MET-HEMOGLOBIN: 1.9 % (ref 0.0–3.5)
O2CT: 21.5 % (ref 15.7–24.3)
OXYHEMOGLOBIN: 92.5 % (ref 85.0–98.0)
PCO2 (ARTERIAL): 49 mmHg — ABNORMAL HIGH (ref 35.0–45.0)
PH (ARTERIAL): 7.27 — ABNORMAL LOW (ref 7.35–7.45)
PO2 (ARTERIAL): 75 mmHg (ref 72.0–100.0)
SODIUM: 134 mmol/L — ABNORMAL LOW (ref 136–145)
WHOLE BLOOD POTASSIUM: 4.8 mmol/L (ref 3.5–5.0)

## 2016-09-09 LAB — CBC WITH DIFF
BASOPHIL #: 0.09 x10ˆ3/uL (ref 0.00–0.20)
BASOPHIL %: 1 %
EOSINOPHIL #: 0.14 x10ˆ3/uL (ref 0.00–0.50)
EOSINOPHIL %: 1 %
HCT: 45.9 % (ref 36.7–47.0)
HGB: 15.9 g/dL (ref 12.5–16.3)
LYMPHOCYTE #: 3.22 x10ˆ3/uL (ref 1.00–4.80)
LYMPHOCYTE %: 23 %
MCH: 29.6 pg (ref 27.4–33.0)
MCHC: 34.6 g/dL (ref 32.5–35.8)
MCV: 85.5 fL (ref 78.0–100.0)
MONOCYTE #: 1.2 x10ˆ3/uL — ABNORMAL HIGH (ref 0.30–1.00)
MONOCYTE %: 9 %
MPV: 8.4 fL (ref 7.5–11.5)
NEUTROPHIL #: 9.18 x10ˆ3/uL — ABNORMAL HIGH (ref 1.50–7.70)
NEUTROPHIL %: 66 %
PLATELETS: 240 x10ˆ3/uL (ref 140–450)
RBC: 5.37 x10ˆ6/uL (ref 4.06–5.63)
RDW: 12.9 % (ref 12.0–15.0)
WBC: 13.8 x10ˆ3/uL — ABNORMAL HIGH (ref 3.5–11.0)

## 2016-09-09 LAB — CREATININE WITH EGFR
CREATININE: 1.14 mg/dL (ref 0.62–1.27)
ESTIMATED GFR: >59 mL/min/1.73mˆ2 (ref >59)

## 2016-09-09 LAB — POC BLOOD GLUCOSE (RESULTS)
GLUCOSE, POC: 115 mg/dL — ABNORMAL HIGH (ref 70–105)
GLUCOSE, POC: 148 mg/dL — ABNORMAL HIGH (ref 70–105)
GLUCOSE, POC: 131 mg/dL — ABNORMAL HIGH (ref 70–105)

## 2016-09-09 LAB — ARTERIAL BLOOD GAS/LACTATE/CO-OX/LYTES (NA/K/CA/CL/GLUC)
%FIO2 (ARTERIAL): 50 %
PAO2/FIO2 RATIO: 150 (ref <=200)

## 2016-09-09 LAB — PTT (PARTIAL THROMBOPLASTIN TIME): APTT: 25.2 s (ref 25.1–36.5)

## 2016-09-09 LAB — PT/INR
INR: 1.04 (ref 0.80–1.20)
PROTHROMBIN TIME: 12.1 s (ref 9.3–13.9)

## 2016-09-09 LAB — BUN: BUN: 13 mg/dL (ref 8–25)

## 2016-09-09 LAB — ELECTROLYTES
ANION GAP: 7 mmol/L (ref 4–13)
CHLORIDE: 109 mmol/L (ref 96–111)
CHLORIDE: 109 mmol/L (ref 96–111)
CO2 TOTAL: 22 mmol/L (ref 22–32)
POTASSIUM: 4.8 mmol/L (ref 3.5–5.1)
POTASSIUM: 4.8 mmol/L (ref 3.5–5.1)
SODIUM: 138 mmol/L (ref 136–145)

## 2016-09-09 LAB — MAGNESIUM: MAGNESIUM: 2 mg/dL (ref 1.6–2.5)

## 2016-09-09 SURGERY — BYPASS GRAFT CORONARY ARTERY MID ROBOTIC
Anesthesia: Spinal | Site: Leg

## 2016-09-09 MED ORDER — HEPARIN (PORCINE) 1,000 UNIT/ML INJECTION SOLUTION
Freq: Once | INTRAMUSCULAR | Status: DC | PRN
Start: 2016-09-09 — End: 2016-09-09
  Administered 2016-09-09: 15000 [IU] via INTRAVENOUS

## 2016-09-09 MED ORDER — HEPARIN (PORCINE) (PF) 1,000 UNIT/500 ML IN 0.9 % SODIUM CHLORIDE IV
INTRAVENOUS | Status: AC
Start: 2016-09-09 — End: 2016-09-09
  Filled 2016-09-09: qty 1000

## 2016-09-09 MED ORDER — PROPOFOL 10 MG/ML IV BOLUS
INJECTION | Freq: Once | INTRAVENOUS | Status: DC | PRN
Start: 2016-09-09 — End: 2016-09-09
  Administered 2016-09-09: 50 mg via INTRAVENOUS

## 2016-09-09 MED ORDER — ROCURONIUM 10 MG/ML INTRAVENOUS SOLUTION
Freq: Once | INTRAVENOUS | Status: DC | PRN
Start: 2016-09-09 — End: 2016-09-09
  Administered 2016-09-09: 100 mg via INTRAVENOUS
  Administered 2016-09-09: 20 mg via INTRAVENOUS
  Administered 2016-09-09: 30 mg via INTRAVENOUS

## 2016-09-09 MED ORDER — IPRATROPIUM BROMIDE 0.02 % SOLUTION FOR INHALATION
0.5000 mg | RESPIRATORY_TRACT | Status: DC | PRN
Start: 2016-09-09 — End: 2016-09-14
  Administered 2016-09-11 – 2016-09-12 (×2): 0.5 mg via RESPIRATORY_TRACT
  Filled 2016-09-09: qty 1

## 2016-09-09 MED ORDER — PANTOPRAZOLE 40 MG TABLET,DELAYED RELEASE
40.00 mg | DELAYED_RELEASE_TABLET | Freq: Every morning | ORAL | Status: DC
Start: 2016-09-10 — End: 2016-09-13
  Administered 2016-09-10 – 2016-09-11 (×2): 0 mg via ORAL
  Administered 2016-09-12 – 2016-09-13 (×2): 40 mg via ORAL
  Filled 2016-09-09 (×2): qty 1

## 2016-09-09 MED ORDER — TRANEXAMIC ACID 1000 MG IN NS 100 ML INFUSION - ANES
INTRAVENOUS | Status: DC | PRN
Start: 2016-09-09 — End: 2016-09-09
  Administered 2016-09-09: 2 mg/kg/h via INTRAVENOUS
  Administered 2016-09-09: 0 mg/kg/h via INTRAVENOUS

## 2016-09-09 MED ORDER — BISACODYL 10 MG RECTAL SUPPOSITORY
10.0000 mg | Freq: Every day | RECTAL | Status: DC | PRN
Start: 2016-09-09 — End: 2016-09-16
  Filled 2016-09-09: qty 1

## 2016-09-09 MED ORDER — ONDANSETRON HCL (PF) 4 MG/2 ML INJECTION SOLUTION
4.0000 mg | Freq: Three times a day (TID) | INTRAMUSCULAR | Status: DC | PRN
Start: 2016-09-09 — End: 2016-09-16
  Administered 2016-09-09 – 2016-09-15 (×5): 4 mg via INTRAVENOUS
  Filled 2016-09-09 (×4): qty 2

## 2016-09-09 MED ORDER — MAGNESIUM HYDROXIDE 400 MG/5 ML ORAL SUSPENSION
30.0000 mL | Freq: Every day | ORAL | Status: DC | PRN
Start: 2016-09-09 — End: 2016-09-16
  Administered 2016-09-13: 2400 mg via ORAL
  Filled 2016-09-09: qty 30

## 2016-09-09 MED ORDER — SODIUM CHLORIDE 0.9 % INTRAVENOUS SOLUTION
INTRAVENOUS | Status: AC
Start: 2016-09-09 — End: 2016-09-09
  Filled 2016-09-09: qty 30

## 2016-09-09 MED ORDER — PANTOPRAZOLE 40 MG INTRAVENOUS SOLUTION
40.0000 mg | Freq: Every day | INTRAVENOUS | Status: DC
Start: 2016-09-09 — End: 2016-09-09
  Administered 2016-09-09: 40 mg via INTRAVENOUS
  Filled 2016-09-09: qty 10

## 2016-09-09 MED ORDER — CEFAZOLIN 1 GRAM SOLUTION FOR INJECTION
INTRAMUSCULAR | Status: AC
Start: 2016-09-09 — End: 2016-09-09
  Filled 2016-09-09: qty 20

## 2016-09-09 MED ORDER — HEPARIN (PORCINE) 5,000 UNIT/ML INJECTION SOLUTION
5000.0000 [IU] | Freq: Three times a day (TID) | INTRAMUSCULAR | Status: DC
Start: 2016-09-10 — End: 2016-09-16
  Administered 2016-09-10: 0 [IU] via SUBCUTANEOUS
  Administered 2016-09-11 – 2016-09-16 (×16): 5000 [IU] via SUBCUTANEOUS
  Filled 2016-09-09 (×21): qty 1

## 2016-09-09 MED ORDER — ACETAMINOPHEN 325 MG TABLET
650.0000 mg | ORAL_TABLET | ORAL | Status: DC | PRN
Start: 2016-09-09 — End: 2016-09-16
  Administered 2016-09-09 – 2016-09-16 (×8): 650 mg via ORAL
  Filled 2016-09-09 (×8): qty 2

## 2016-09-09 MED ORDER — SODIUM CHLORIDE 0.9 % INTRAVENOUS SOLUTION
INTRAVENOUS | Status: DC | PRN
Start: 2016-09-09 — End: 2016-09-09

## 2016-09-09 MED ORDER — ACETAMINOPHEN 1,000 MG/100 ML (10 MG/ML) INTRAVENOUS SOLUTION
Freq: Once | INTRAVENOUS | Status: DC | PRN
Start: 2016-09-09 — End: 2016-09-09
  Administered 2016-09-09: 1000 mg via INTRAVENOUS

## 2016-09-09 MED ORDER — OXYCODONE 5 MG TABLET
10.0000 mg | ORAL_TABLET | ORAL | Status: DC | PRN
Start: 2016-09-09 — End: 2016-09-16
  Administered 2016-09-09 – 2016-09-16 (×22): 10 mg via ORAL
  Filled 2016-09-09 (×22): qty 2

## 2016-09-09 MED ORDER — ALBUMIN, HUMAN 5 % INTRAVENOUS SOLUTION
12.5000 g | INTRAVENOUS | Status: DC | PRN
Start: 2016-09-09 — End: 2016-09-11
  Administered 2016-09-10: 0 g via INTRAVENOUS
  Administered 2016-09-10: 12.5 g via INTRAVENOUS
  Administered 2016-09-10 (×2): 0 g via INTRAVENOUS
  Administered 2016-09-10 (×2): 12.5 g via INTRAVENOUS
  Administered 2016-09-10: 0 g via INTRAVENOUS
  Administered 2016-09-10 (×2): 12.5 g via INTRAVENOUS
  Administered 2016-09-10 – 2016-09-11 (×3): 0 g via INTRAVENOUS
  Administered 2016-09-11 (×2): 12.5 g via INTRAVENOUS

## 2016-09-09 MED ORDER — PROTAMINE 10 MG/ML INTRAVENOUS SOLUTION
Freq: Once | INTRAVENOUS | Status: DC | PRN
Start: 2016-09-09 — End: 2016-09-09
  Administered 2016-09-09: 50 mg via INTRAVENOUS

## 2016-09-09 MED ORDER — LACTATED RINGERS INTRAVENOUS SOLUTION
INTRAVENOUS | Status: DC | PRN
Start: 2016-09-09 — End: 2016-09-09

## 2016-09-09 MED ORDER — DEXMEDETOMIDINE 4 MCG/ML IV DILUTION
INTRAMUSCULAR | Status: AC
Start: 2016-09-09 — End: 2016-09-09
  Filled 2016-09-09: qty 10

## 2016-09-09 MED ORDER — BUPIVACAINE (PF) 0.25 % (2.5 MG/ML) INJECTION SOLUTION
Freq: Once | INTRAMUSCULAR | Status: DC | PRN
Start: 2016-09-09 — End: 2016-09-09
  Administered 2016-09-09: 30 mL

## 2016-09-09 MED ORDER — SUGAMMADEX 100 MG/ML INTRAVENOUS SOLUTION
Freq: Once | INTRAVENOUS | Status: DC | PRN
Start: 2016-09-09 — End: 2016-09-09
  Administered 2016-09-09: 240 mg via INTRAVENOUS

## 2016-09-09 MED ORDER — SODIUM CHLORIDE 0.9 % IRRIGATION SOLUTION
Freq: Once | Status: DC | PRN
Start: 2016-09-09 — End: 2016-09-09
  Administered 2016-09-09: 4000 mL

## 2016-09-09 MED ORDER — MUPIROCIN 2 % TOPICAL OINTMENT
TOPICAL_OINTMENT | Freq: Two times a day (BID) | CUTANEOUS | Status: AC
Start: 2016-09-09 — End: 2016-09-13
  Filled 2016-09-09: qty 22

## 2016-09-09 MED ORDER — SODIUM CHLORIDE 0.9 % (FLUSH) INJECTION SYRINGE
2.0000 mL | INJECTION | Freq: Three times a day (TID) | INTRAMUSCULAR | Status: DC
Start: 2016-09-09 — End: 2016-09-10
  Administered 2016-09-09 – 2016-09-10 (×4): 2 mL

## 2016-09-09 MED ORDER — CHLORHEXIDINE GLUCONATE 0.12 % MOUTHWASH
15.0000 mL | MOUTHWASH | Freq: Two times a day (BID) | Status: DC
Start: 2016-09-09 — End: 2016-09-16
  Administered 2016-09-09 – 2016-09-12 (×7): 15 mL via TOPICAL
  Administered 2016-09-12 – 2016-09-13 (×2): 0 mL via TOPICAL
  Administered 2016-09-13 – 2016-09-15 (×4): 15 mL via TOPICAL
  Administered 2016-09-15: 0 mL via TOPICAL
  Administered 2016-09-16: 15 mL via TOPICAL
  Filled 2016-09-09 (×16): qty 15

## 2016-09-09 MED ORDER — KETOROLAC 30 MG/ML (1 ML) INJECTION SOLUTION
15.0000 mg | Freq: Once | INTRAMUSCULAR | Status: AC
Start: 2016-09-09 — End: 2016-09-09
  Administered 2016-09-09: 15 mg via INTRAVENOUS
  Filled 2016-09-09: qty 1

## 2016-09-09 MED ORDER — CEFAZOLIN 2 GRAM SOLUTION FOR INJECTION - IV PUSH KIT
2.0000 g | Freq: Three times a day (TID) | INTRAMUSCULAR | Status: DC
Start: 2016-09-09 — End: 2016-09-09
  Filled 2016-09-09 (×2): qty 20

## 2016-09-09 MED ORDER — TRANEXAMIC ACID 1000 MG IN NS 100 ML IVPB - ANES
Freq: Once | INTRAVENOUS | Status: DC | PRN
Start: 2016-09-09 — End: 2016-09-09
  Administered 2016-09-09: 1000 mg via INTRAVENOUS

## 2016-09-09 MED ORDER — DEXMEDETOMIDINE 4 MCG/ML IV DILUTION
Freq: Once | INTRAMUSCULAR | Status: DC | PRN
Start: 2016-09-09 — End: 2016-09-09
  Administered 2016-09-09: 07:00:00 20 ug via INTRAVENOUS

## 2016-09-09 MED ORDER — NITROGLYCERIN 100 MCG/ML IN D5W INJECTION
INJECTION | INTRAVENOUS | Status: AC
Start: 2016-09-09 — End: 2016-09-09
  Filled 2016-09-09: qty 20

## 2016-09-09 MED ORDER — DOCUSATE SODIUM 100 MG CAPSULE
100.0000 mg | ORAL_CAPSULE | Freq: Two times a day (BID) | ORAL | Status: DC
Start: 2016-09-10 — End: 2016-09-16
  Administered 2016-09-10 – 2016-09-11 (×3): 0 mg via ORAL
  Administered 2016-09-11 – 2016-09-12 (×2): 100 mg via ORAL
  Administered 2016-09-13: 0 mg via ORAL
  Administered 2016-09-13: 100 mg via ORAL
  Administered 2016-09-14 (×2): 0 mg via ORAL
  Administered 2016-09-15 – 2016-09-16 (×3): 100 mg via ORAL
  Filled 2016-09-09 (×8): qty 1

## 2016-09-09 MED ORDER — FENTANYL (PF) 50 MCG/ML INJECTION SOLUTION
50.0000 ug | INTRAMUSCULAR | Status: DC | PRN
Start: 2016-09-09 — End: 2016-09-13
  Administered 2016-09-10 (×2): 25 ug via INTRAVENOUS
  Administered 2016-09-10 – 2016-09-11 (×5): 50 ug via INTRAVENOUS
  Filled 2016-09-09 (×8): qty 2

## 2016-09-09 MED ORDER — SODIUM CHLORIDE 0.9 % INTRAVENOUS SOLUTION
INTRAVENOUS | Status: DC
Start: 2016-09-09 — End: 2016-09-14
  Administered 2016-09-10: 0 via INTRAVENOUS

## 2016-09-09 MED ORDER — NOREPINEPHRINE 16MG IN NS 250ML INFUSION - FOR ANES
INTRAVENOUS | Status: DC | PRN
Start: 2016-09-09 — End: 2016-09-09
  Administered 2016-09-09: .02 ug/kg/min via INTRAVENOUS
  Administered 2016-09-09: 0.03 ug/kg/min via INTRAVENOUS
  Administered 2016-09-09: 0 ug/kg/min via INTRAVENOUS

## 2016-09-09 MED ORDER — PHENYLEPHRINE 60MG/250ML IN NS INFUSION
INTRAVENOUS | Status: AC
Start: 2016-09-09 — End: 2016-09-09
  Filled 2016-09-09: qty 250

## 2016-09-09 MED ORDER — SODIUM CHLORIDE 0.9 % (FLUSH) INJECTION SYRINGE
2.0000 mL | INJECTION | INTRAMUSCULAR | Status: DC | PRN
Start: 2016-09-09 — End: 2016-09-10

## 2016-09-09 MED ORDER — SUFENTANIL 5 MCG/ML INFUSION - FOR ANES
INTRAVENOUS | Status: DC | PRN
Start: 2016-09-09 — End: 2016-09-09
  Administered 2016-09-09 (×2): 0.25 ug/kg/h via INTRAVENOUS
  Administered 2016-09-09: 0 ug/kg/h via INTRAVENOUS

## 2016-09-09 MED ORDER — LIDOCAINE (PF) 100 MG/5 ML (2 %) INTRAVENOUS SYRINGE
INJECTION | Freq: Once | INTRAVENOUS | Status: DC | PRN
Start: 2016-09-09 — End: 2016-09-09
  Administered 2016-09-09 (×2): 100 mg via INTRAVENOUS

## 2016-09-09 MED ORDER — MIDAZOLAM 1 MG/ML INJECTION SOLUTION
Freq: Once | INTRAMUSCULAR | Status: DC | PRN
Start: 2016-09-09 — End: 2016-09-09
  Administered 2016-09-09: 2 mg via INTRAVENOUS
  Administered 2016-09-09: 3 mg via INTRAVENOUS

## 2016-09-09 MED ORDER — FENTANYL (PF) 50 MCG/ML INJECTION SOLUTION
Freq: Once | INTRAMUSCULAR | Status: DC | PRN
Start: 2016-09-09 — End: 2016-09-09
  Administered 2016-09-09 (×2): 200 ug via INTRAVENOUS
  Administered 2016-09-09: 50 ug via INTRAVENOUS

## 2016-09-09 MED ORDER — PAPAVERINE 30 MG/ML INJECTION SOLUTION
INTRAMUSCULAR | Status: AC
Start: 2016-09-09 — End: 2016-09-09
  Filled 2016-09-09: qty 4

## 2016-09-09 MED ORDER — LEVALBUTEROL CONCENTRATE 1.25 MG/0.5 ML SOLUTION FOR NEBULIZATION
1.2500 mg | INHALATION_SOLUTION | RESPIRATORY_TRACT | Status: DC | PRN
Start: 2016-09-09 — End: 2016-09-14
  Administered 2016-09-11 – 2016-09-12 (×2): 1.25 mg via RESPIRATORY_TRACT
  Filled 2016-09-09: qty 1

## 2016-09-09 MED ORDER — ASPIRIN 81 MG CHEWABLE TABLET
81.0000 mg | CHEWABLE_TABLET | Freq: Every day | ORAL | Status: DC
Start: 2016-09-09 — End: 2016-09-16
  Administered 2016-09-09: 81 mg via ORAL
  Administered 2016-09-10: 0 mg via ORAL
  Administered 2016-09-11 – 2016-09-15 (×5): 81 mg via ORAL
  Filled 2016-09-09 (×8): qty 1

## 2016-09-09 MED ORDER — OXYCODONE 5 MG TABLET
5.0000 mg | ORAL_TABLET | ORAL | Status: DC | PRN
Start: 2016-09-09 — End: 2016-09-16
  Administered 2016-09-14 – 2016-09-16 (×2): 5 mg via ORAL
  Filled 2016-09-09 (×3): qty 1

## 2016-09-09 MED ORDER — FENTANYL (PF) 50 MCG/ML INJECTION SOLUTION
INTRAMUSCULAR | Status: AC
Start: 2016-09-09 — End: 2016-09-09
  Administered 2016-09-09: 50 ug via INTRAVENOUS
  Filled 2016-09-09: qty 2

## 2016-09-09 MED ORDER — INSULIN LISPRO 100 UNIT/ML SUB-Q SSIP
2.00 [IU] | INJECTION | Freq: Four times a day (QID) | SUBCUTANEOUS | Status: DC | PRN
Start: 2016-09-09 — End: 2016-09-16
  Administered 2016-09-10: 2 [IU] via SUBCUTANEOUS
  Administered 2016-09-10: 4 [IU] via SUBCUTANEOUS
  Administered 2016-09-10 – 2016-09-11 (×2): 2 [IU] via SUBCUTANEOUS
  Filled 2016-09-09: qty 3

## 2016-09-09 MED ORDER — BUPIVACAINE (PF) 0.25 % (2.5 MG/ML) INJECTION SOLUTION
INTRAMUSCULAR | Status: AC
Start: 2016-09-09 — End: 2016-09-09
  Filled 2016-09-09: qty 30

## 2016-09-09 MED ORDER — SENNOSIDES 8.6 MG-DOCUSATE SODIUM 50 MG TABLET
1.0000 | ORAL_TABLET | Freq: Two times a day (BID) | ORAL | Status: DC
Start: 2016-09-10 — End: 2016-09-16
  Administered 2016-09-10 (×2): 0 via ORAL
  Administered 2016-09-11: 1 via ORAL
  Administered 2016-09-11: 0 via ORAL
  Administered 2016-09-12 (×2): 1 via ORAL
  Administered 2016-09-13: 0 via ORAL
  Administered 2016-09-13: 1 via ORAL
  Administered 2016-09-14 (×2): 0 via ORAL
  Administered 2016-09-15 (×2): 1 via ORAL
  Administered 2016-09-16: 0 via ORAL
  Filled 2016-09-09 (×7): qty 1

## 2016-09-09 MED ORDER — DEXTROSE 5 % IN WATER (D5W) INTRAVENOUS SOLUTION
2.0000 g | Freq: Three times a day (TID) | INTRAVENOUS | Status: AC
Start: 2016-09-09 — End: 2016-09-11
  Administered 2016-09-09: 2 g via INTRAVENOUS
  Administered 2016-09-09 – 2016-09-10 (×2): 0 g via INTRAVENOUS
  Administered 2016-09-10: 2 g via INTRAVENOUS
  Administered 2016-09-10 (×2): 0 g via INTRAVENOUS
  Administered 2016-09-10 (×2): 2 g via INTRAVENOUS
  Administered 2016-09-11: 0 g via INTRAVENOUS
  Administered 2016-09-11: 2 g via INTRAVENOUS
  Filled 2016-09-09 (×5): qty 20

## 2016-09-09 MED ORDER — HYDROMORPHONE 2 MG/ML INJECTION SYRINGE
INJECTION | Freq: Once | INTRAMUSCULAR | Status: DC | PRN
Start: 2016-09-09 — End: 2016-09-09
  Administered 2016-09-09: 1 mg via INTRAVENOUS

## 2016-09-09 MED ADMIN — sodium chloride 0.9 % (flush) injection syringe: @ 08:00:00

## 2016-09-09 MED ADMIN — sodium chloride 0.9 % (flush) injection syringe: @ 13:00:00

## 2016-09-09 MED ADMIN — sodium chloride 0.9 % (flush) injection syringe: @ 20:00:00

## 2016-09-09 MED ADMIN — lactated Ringers intravenous solution: INTRAVENOUS | @ 07:00:00

## 2016-09-09 MED ADMIN — HYDROmorphone 2 mg/mL injection syringe: INTRAVENOUS | @ 08:00:00

## 2016-09-09 MED ADMIN — Medication: INTRAVENOUS | @ 21:00:00

## 2016-09-09 MED ADMIN — lidocaine 5 % topical patch: INTRAVENOUS | @ 02:00:00

## 2016-09-09 MED ADMIN — lactated Ringers intravenous solution: INTRAVENOUS | @ 09:00:00

## 2016-09-09 MED ADMIN — PANTOPRAZOLE 80MG IN NS 100ML CONTINUOUS INFUSION: INTRAVENOUS | @ 11:00:00

## 2016-09-09 MED ADMIN — lactated Ringers intravenous solution: ORAL | @ 23:00:00 | NDC 00338011704

## 2016-09-09 MED ADMIN — sodium chloride 0.9 % intravenous solution: INTRAVENOUS | @ 07:00:00 | NDC 00338004904

## 2016-09-09 MED ADMIN — lactated Ringers intravenous solution: INTRAVENOUS | NDC 00338011704

## 2016-09-09 MED ADMIN — sodium chloride 0.9 % (flush) injection syringe: TOPICAL | @ 21:00:00

## 2016-09-09 MED ADMIN — levalbuteroL 0.63 mg/3 mL solution for nebulization: INTRAVENOUS | @ 10:00:00

## 2016-09-09 SURGICAL SUPPLY — 102 items
APPLICATOR W/DUAL SPRAY TIP_11:1 RATIO MUST ORDER 10 (SEALANTS)
CANNULA DUAL MALLEABLE 16 X10_MIN ORDER 10 (CANNULA)
CART HEPCON 5L HEP DS RSPN CAR_T SYRG BLUNT TIP NEEDLE (TEST) ×4 IMPLANT
CART HEPCON SILVER 2-3.5MG/KG_BLUNT HEP 4 CHNL SYRG NEEDLE (TEST) IMPLANT
CART HMS + HEP ASY 2 CHNL 9 SYRG HI RNG ACT CLOT TIME ANALYZER DISP (TEST) ×9 IMPLANT
CART HMS + HR-ACT HEP ASY 2 CH_NL 9 SYRG BLUNT NEEDLE (TEST) ×3
CART HMS + RD .9- MG/KG 4 CHNL 9 SYRG HEP ASY BLUNT NEEDLE ANALYZER DISP (TEST) IMPLANT
CART HMS + RD 0-.9MG/KG HEP AS_Y 4 CHNL 9 SYRG BLUNT NEEDLE (TEST)
CART ISTAT HEMA CG8+ ANALYZER (REAGENT) ×12 IMPLANT
CONV USE 337844 - PACK SURG CARDIAC CABG NONST DISP LF (CUSTOM TRAYS & PACK) ×3 IMPLANT
CORD BIPOLAR FOOTSWITCH 12FT E0509 50EA/CS STRL DISP (CAUTERY SUPPLIES) ×4 IMPLANT
CORD ESURG 10FT VLAB MONOPOL FOOTSWITCH STRL LF  DISP (CAUTERY SUPPLIES) ×3 IMPLANT
CORD ESURG 10FT VLAB MONOPOL F_OOTSWITCH STRL LF DISP (CAUTERY SUPPLIES) ×1
CUVETTE MONITOR 3/8IN HS (PERFUSION/HEART SUPPLIES)
CUVETTE PERFUSION MONITOR H/S 3/8IN X 3/8IN DISPOSABLE (PERFUSION/HEART SUPPLIES) IMPLANT
DEVICE SUCT DLP 6FR 6.5IN TUBE_.25IN SLIP CONN CARDIAC STRL (Suction) ×2
DONUT EXTREMITY CUSHIONING 31143137 (POSITIONING PRODUCTS) ×4 IMPLANT
DRAIN INCS 24FR JP SIL CHNL STRL LF  RND FLUTE DISP WHT (Drains/Resovoirs) ×3 IMPLANT
DRAIN WND RND 24FR JP2234_10/BX (Drains/Resovoirs) ×1
DRAPE ARM DAVINCI S/SI EQP (EQUIPMENT MINOR) ×4 IMPLANT
DRAPE CAM ARM DAVINCI SI DISP EQP (EQUIPMENT MINOR) ×3 IMPLANT
DRAPE CAM ARM DAVINCI SI DISP_EQP (EQUIPMENT MINOR) ×1
DRAPE CAM HEAD DAVINCI SI DISP EQP (EQUIPMENT MINOR) ×3 IMPLANT
DRAPE CAM HEAD DAVINCI SI DISP_EQP (EQUIPMENT MINOR) ×1
DUP USE ITEM 161800 - RESERVOIR AUTOTRANSFUSION 40 U_M COLLECTION FILTER (PERFUSION/HEART SUPPLIES) IMPLANT
ELECTRODE DEFIBR QCMBO 59-95F TEMP CONTROL ADULT (Electrical Supplies) ×3 IMPLANT
ELECTRODE DEFIBR RD 24IN EDGE_SYS QCMB LEADWIRE ADLT LP12 LF (Electrical Supplies) ×1
ELECTRODE PATIENT RTN 9FT VLAB C30- LB RM PHSV ACRL FOAM CORD NONIRRITATE NONSENSITIZE ADH STRP (CAUTERY SUPPLIES) ×3 IMPLANT
ELECTRODE PATIENT RTN 9FT VLAB_REM C30- LB PLHSV ACRL FOAM (CAUTERY SUPPLIES) ×2
ENDOSCP MNTN CLEARIFY 8X6IN WA_RM HUB 2 CLTH TROCAR WP MRFBR (INSTRUMENTS) ×1
FILTER BLOOD PALL LEUKOGUARD 6/CS LG6B (BLOOD) IMPLANT
FILTER CARDIOPLEGIA CPS02 (PERFUSION/HEART SUPPLIES) IMPLANT
FILTER O2 PRE BYPASS 1/4X1/4_029029000 25EA/CS (ANETHESIA SUPPLIES) ×1
FILTER PERF .25IN O2 STRL (ANETHESIA SUPPLIES) ×3 IMPLANT
GOWN SURG LRG AAMI L3 NONREINF_ORCE SET IN SLEEVE HKLP CLSR (DGOW) ×1
GOWN SURG LRG L3 NONREINFORCE HKLP CLSR SET IN SLEEVE STRL LF  DISP BLU SIRUS SMS 43IN (DGOW) ×3 IMPLANT
HEMOCONCR .07SQ MR HPH MINI LOW PRM VOL GNTL ULFLTR RATE PERF PLSLFN 15CM 2.5CM 14ML STRL (PERFUSION/HEART SUPPLIES) IMPLANT
HEMOCONCR .07SQ MR HPH MINI LO_W PRM VOL GNTL ULFLTR RT PERF (PERFUSION/HEART SUPPLIES)
IMG CLEARIFY 8X6IN WARM HUB TROCAR WIPE MRFBR SYSTEM DISP (SURGICAL INSTRUMENTS) ×3 IMPLANT
KNIFE OPTH SHARPOINT SHARPTOME 2MM STR CRSNT SMOOTH SCLR SHLF (OPHTHALMIC SUPPLIES (NOT LENS)) ×3 IMPLANT
KNIFE OPTH SHARPOINT SHARPTOME_2MM STR CRESCENT SMOOTH SCLR (OPTHALMIC SUPPLIES (NOT LENS)) ×2
LINE ISOLATION EA 1K62R1 PK/10 (IV TUBING & ACCESSORIES) IMPLANT
LINE TABLE CARDIOPLEGIA CSC14_CS/10 027532201 (IRR)
NEEDLE SPINAL BLK 2.5IN 22GA QUINCKE REG WL POLYPROP STRL LF  DISP (NEEDLES & SYRINGE SUPPLIES) ×3 IMPLANT
NEEDLE SPINAL BLK 2.5IN 22GA Q_UINCKE REG WL POLYPROP REG BVL (NEEDLES & SYRINGE SUPPLIES) ×1
OXYGENATOR ID QUADROX BIOLINE (PERFUSION/HEART SUPPLIES) IMPLANT
OXYGENATOR PERF 1CXFX15E (PERFUSION/HEART SUPPLIES) IMPLANT
OXYGENATOR PERF 1CXFX25E (PERFUSION/HEART SUPPLIES) IMPLANT
PACK 1/2IN VENOUS PERF (PACK) IMPLANT
PACK 3/8IN VENOUS (PACK) IMPLANT
PACK ACCESSORY PERF_020843701 10/CS (PERFUSION/HEART SUPPLIES) IMPLANT
PACK INSPIRE 046006600_046006600 (PERFUSION/HEART SUPPLIES) IMPLANT
PACK PRC STRL DISP HARV SMRT_PRP 2 LF (KITS & TRAYS (DISPOSABLE)) IMPLANT
PACK PROCEDURE MDLN INDUSTRIES_INC. SURG PLST GEN N/M N/S (CUSTOM TRAYS & PACK) ×1
PACK SURG PROC LF  STRL .25X.25IN PED (PERFUSION/HEART SUPPLIES) IMPLANT
PACK SURG PROC LF  STRL 3/8X3/8IN PED (PERFUSION/HEART SUPPLIES) IMPLANT
PACK SURG TBG STRL DISP 30IN SIL LF (Connecting Tubes/Misc) ×3 IMPLANT
PACK TUBING ECMO CUSTOM (PACK) IMPLANT
PAD MNT ADH LEVEL (TEMP) IMPLANT
PAD MOUNTING LEVEL I 232741_CS/100 (TEMP)
PROBE SURG 15CM 1-1.5MM VAS STREAMLINE BULB X SUPPLE FLXB SHAFT PLMR STRL DISP (SURGICAL INSTRUMENTS) ×3 IMPLANT
PROBE SURG 15CM 1-1.5MM VAS ST_REAMLINE BULB X SUPPLE FLXB (INSTRUMENTS) ×1
PRTC ALEXIS O FLXB RETRACT RING ATRAUMA TISS SM 18CM 2.5-6CM INCS (SURGICAL INSTRUMENTS) ×3 IMPLANT
PUMP HEAD REV SAT/CRIT_W/TUBING (PERFUSION/HEART SUPPLIES) IMPLANT
PUMP HEAD REVOLUTION W/TUBING (MISCELLANEOUS PT CARE ITEMS) IMPLANT
RETRACTOR WND ALEXIS 2.5-6CM_C8401 SMALL DISP 5/BX (INSTRUMENTS) ×1
SEAL CANN 8MM DAVINCI ENDOWRIST DISP (SURGICAL INSTRUMENTS) ×9 IMPLANT
SEAL CANN 8MM DAVINCI ENDOWRIS_T DISP (INSTRUMENTS) ×3
SENSOR CDI 500_20/CS CDI510H (INSTRUMENTS)
SENSOR SHUNT CDI HEP 1.2ML SYS 500 STRL DISP (SURGICAL INSTRUMENTS) IMPLANT
SET AUTOTRANS WASH CHAMBER TUB_E AT1 CATS (IV TUBING & ACCESSORIES) IMPLANT
SET AUTOTRANSFUSION TUBING ATS_SUCTION LINE (Cautery Accessories) IMPLANT
SET CARDIOPLEGIA DEL LINE 6FT (PERFUSION/HEART SUPPLIES)
SET CARDIOPLEGIA DEL LINE_MIN ORDER 10 (PERFUSION/HEART SUPPLIES) IMPLANT
SET CRDPLG 16 CONVOLUTION (PERFUSION/HEART SUPPLIES) IMPLANT
SET EXT 3/16IN 6FT LWVL LINE (PERFUSION/HEART SUPPLIES) IMPLANT
SET PERF HEART/LUNG 1/4 X 1/4_075103402 (PERFUSION/HEART SUPPLIES)
SET TBL LINE CARDIOPLGA (IRR) IMPLANT
SET TUBING PERF PEDS 3/8 X 3/8_W/BIOPUMP 046006300 1EA/CS (PERFUSION/HEART SUPPLIES)
SHUNT CV 18MM 2MM AXIUS COR ART INTERNAL COIL THREAD TAB VIS OF ANSTM ST STD OFST TAPER (CANNULA) ×3 IMPLANT
SHUNT CV 18MM 2MM AXIUS COR AR_T INTERNAL COIL THREAD TAB VIS (CANNULA) ×1
SOL IRRG 0.9% NACL 1000ML PLASTIC PR BTL ISTNC N-PYRG STRL LF (SOLUTIONS) ×6 IMPLANT
SOL IV VIAFLEX PLAS-LYTE A PH 7.4 TY 1 1000ML LF (SOLUTIONS) IMPLANT
SOLUTION IRRG NS 2F7124 1000CC_12/CS (SOLUTIONS) ×2
SOLUTION IV ELECTROLYTES_1000ML 2B2544X CS/14 (SOLUTIONS)
STAB SURG OCTPS NUVO (INSTRUMENTS) ×1
STAB SURG OCTPS NUVO (SURGICAL INSTRUMENTS) ×3 IMPLANT
STOPCOCK PERF 1 WY UNDIR PURGE LINE STRL (PERFUSION/HEART SUPPLIES) IMPLANT
STOPCOCK PERF 1 WY UNDIR PURGE_LINE STRL (PERFUSION/HEART SUPPLIES)
TIP APPL 10CM 16GA 2 CANN (CANNULA) IMPLANT
TIP APPL 2 SPRAY 11:1 (SEALANTS) IMPLANT
TROCAR LAPSCP LONG 150MM 12MM VERSAONE FIX CANN BLDLS DLPHN NOSE TIP OPTC STRL LF  DISP (ENDOSCOPIC SUPPLIES) IMPLANT
TROCAR OPTICAL 12MM LONG (INSTRUMENTS ENDOMECHANICAL)
TUBE SUCT 6IN 6FR 6FR DLP CARDIAC SS FRZR TIP MLBL (Suction) ×3 IMPLANT
TUBING MONITORING PRESS 72IN_50-P172 (CUFF)
TUBING PERF 1/2X3/32X6FT_020466101 (PERFUSION/HEART SUPPLIES) IMPLANT
TUBING PERF 1/4X1/16X6FT_020463101 (PERFUSION/HEART SUPPLIES)
TUBING PERF 3/8X3/32X6FT_020465101 (PERFUSION/HEART SUPPLIES)
TUBING PERF PUMP .25INX1/16IN STD 72IN SEG STRL (PERFUSION/HEART SUPPLIES) IMPLANT
TUBING PERF PUMP 3/8INX3/32IN 72IN STD DRMTR 68 SHOR STRL (PERFUSION/HEART SUPPLIES) IMPLANT
TUBING PRESS MONITOR 72IN TRUWAVE MALE TO FEMALE CONN TRANSDUC STRL LF  DISP (CUFF) IMPLANT
TUBING SILICONE 30IN (Connecting Tubes/Misc) ×1

## 2016-09-09 NOTE — Nurses Notes (Signed)
Patient 2nd prep completed, No complained at this time. (+) bowel movement noted. Resting on bed.

## 2016-09-09 NOTE — Brief Op Note (Signed)
[facility]                                                     BRIEF OPERATIVE NOTE    Patient Name: [name] [name],[name]  Hospital Number: [id number]  Date of Service: 09/09/2016   Date of Birth:     All elements must be documented.    Pre-Operative Diagnosis: Coronary artery disease   Post-Operative Diagnosis: Coronary artery disease  Procedure(s)/Description:  Minimally invasive direct coronary artery bypass (MIDCAB)  Findings/Complexity (inherent to the procedure performed): Post-bypass conduit flows verified    Attending Surgeon: Randa EvensLawrence Wei  Assistant(s): Maryjo RochesterKevin Chukwuebuka Churchill    Anesthesia Type: General  Estimated Blood Loss:  Minimal  Blood Given: none  Fluids Given: per anesthesia record  Complications (not routinely expected or not inherent to difficulty/nature of procedure):none  Characteristic Event (routinely expected or inherent to the difficulty/nature of the procedure): na  Did the use of current and/or prior Anticoagulants impact the outcome of the case? no  Wound Class: Clean Wound: Uninfected operative wounds in which no inflammation occurred    Tubes: Chest Tube x 1  Drains: None  Specimens/ Cultures: none  Implants: none           Disposition: ICU - extubated and stable.  Condition: stable    Maryjo RochesterKevin Melea Prezioso, PA-C

## 2016-09-09 NOTE — Care Plan (Signed)
Problem: Patient Care Overview (Adult,OB)  Goal: Plan of Care Review(Adult,OB)  The patient and/or their representative will communicate an understanding of their plan of care   Outcome: Ongoing (see interventions/notes)  Patient was alert, oriented and conversant. No complained of chestpain and running NSR with rate 60-80 and BP 120/70-80. Repositioned himself independently. Fall precaution maintained. Verbalization of feelings/concerns encouraged and emotional support provided. Pre op prep for CABG completed. Heparin drip stopped at 0245 per NP Donnie. Maintained on NPO after midnight. Scheduled for mid CABG at 0645 today. Instructed on deep breathing and coughing exercises.  Plan of care reviewed with patient.   Goal: Individualization/Patient Specific Goal(Adult/OB)  Outcome: Ongoing (see interventions/notes)  Goal: Interdisciplinary Rounds/Family Conf  Outcome: Ongoing (see interventions/notes)    Problem: Cardiac: ACS (Acute Coronary Syndrome) (Adult)  Prevent and manage potential problems including: 1. cardiovascular structural defects 2. chest pain (angina) 3. dysrhythmia/arrhythmia 4. embolism 5. heart failure/shock 6. ischemia leading to infarction 7. pericarditis 8. situational response   Goal: Signs and Symptoms of Listed Potential Problems Will be Absent, Minimized or Managed (Cardiac: ACS)  Signs and symptoms of listed potential problems will be absent, minimized or managed by discharge/transition of care (reference Cardiac: ACS (Acute Coronary Syndrome) (Adult) CPG).   Outcome: Ongoing (see interventions/notes)    Problem: Fall Risk (Adult)  Goal: Absence of Falls  Patient will demonstrate the desired outcomes by discharge/transition of care.   Outcome: Ongoing (see interventions/notes)

## 2016-09-09 NOTE — Nurses Notes (Signed)
51 y.o. male pt admitted to CVICU from OR S/P mid CAB. Pt hooked to monitor by RN, and to AFM by RT. Labs, CXR, and FS obtained. Report received from anesthesia. See results section for Labs, CXR, and FS results.

## 2016-09-09 NOTE — Anesthesia Postprocedure Evaluation (Signed)
Anesthesia Post Op Evaluation    Patient: [name] [name]  Procedure(s) with comments:  Bypass Graft Coronary Artery Mid Robotic  Perfusion Charge/Stby/Cardiopulmonary Bypass  Autologous Blood Conservation Setup  Autologous Blood Conservation, Monitoring Per Hour - 9SE 3    Last Vitals:Temperature: 36.1 C (97 F) (09/09/16 1110)  Heart Rate: 90 (09/09/16 1110)  BP (Non-Invasive): 121/81 (09/09/16 0357)  Respiratory Rate: 16 (09/09/16 1110)  SpO2-1: 94 % (09/09/16 1110)  Pain Score (Numeric, Faces): 0 (09/09/16 0543)  Patient is sufficiently recovered from the effects of anesthesia to participate in the evaluation and has returned to their pre-procedure level.  Patient location during evaluation: PACU   Post-procedure handoff checklist completed    Patient participation: complete - patient participated  Level of consciousness: awake and alert and responsive to verbal stimuli  Pain management: adequate  Airway patency: patent  Anesthetic complications: no  Cardiovascular status: acceptable  Respiratory status: acceptable  Hydration status: acceptable  Patient post-procedure temperature: Pt Normothermic   PONV Status: Absent

## 2016-09-09 NOTE — Nurses Notes (Signed)
Patient transferred to pre post.  Patient stable.  No family at bedside currently.

## 2016-09-09 NOTE — Nurses Notes (Signed)
ACNP Travaglino notified that CVP has been reading negative values after zeroing and flushing. Verbal orders received to turn off CVP monitoring. Patient HR also increased to 130s for short burst of ST. No orders given. Will continue to monitor closely.

## 2016-09-09 NOTE — Nurses Notes (Signed)
Report received from day shift RN. MAR, labs, and Kardex reviewed. Assessment and vitals per flow sheet. Will continue to monitor.

## 2016-09-09 NOTE — Nurses Notes (Signed)
Skin prep and clipping completed, Hibiclens shower done. Patient resting comfortably on the bed with his home CPAP machine. No complained at this time. Will continue to monitor.

## 2016-09-09 NOTE — Nurses Notes (Signed)
Pt admitted to CVICU 12 from OR S/P robotic MVR and CABG. Pt hooked to monitor by RN, and to O2 facemask by RT. Labs, CXR, and FS obtained. Report received from anesthesia. No gtts infusing. See flowsheet for full vitals and assessment.

## 2016-09-09 NOTE — Nurses Notes (Signed)
Report given to OR nurse, transported patient to 2nd floor pre post area per bed accompanied by RN and transporter.

## 2016-09-09 NOTE — Care Management Notes (Signed)
[facility]  Care Management Note    Patient Name: [name] [name]  Date of Birth:   Sex: male  Date/Time of Admission: 09/06/2016  7:27 PM  Room/Bed: 12/A  Payor: BLUE CROSS BLUE SHIELD / Plan: [place] BC/BS PPO / Product Type: PPO /    LOS: 0 days   PCP: Randol Kernarrie Leach, PA-C    Admitting Diagnosis:  Unstable angina (HCC) [I20.0]  Unstable angina (HCC) [I20.0]  Unstable angina (HCC) [I20.0]    Assessment:      09/09/16 1725   Assessment Details   Assessment Type Continued Assessment   Date of Care Management Update 09/09/16   Date of Next DCP Update 09/12/16   Care Management Plan   Discharge Planning Status plan in progress   Projected Discharge Date 09/14/16   CM will evaluate for rehabilitation potential yes   transfer from Cards gold to SCT today   OR for Mid Cab    Discharge Plan:  Home with Home Health (code 6)  Will follow up with Pt on Monday to obtain choice and begin discharge planning.     The patient will continue to be evaluated for developing discharge needs.     Case Manager: Charlann Langearrie O'Neil, SOCIAL WORKER  Phone: [phone]

## 2016-09-09 NOTE — Nurses Notes (Signed)
Patient requested that wife be permitted back for visitation once he is more alert. Wife called in the waiting room and updated by RN.

## 2016-09-09 NOTE — Nurses Notes (Signed)
Patients chest tube output 125 cc in the last 15 minutes of sanguous drainage. ACNP Geraldo PitterGoodwin notified. Orders obtained to notify service with the next output of the chest tube in 15 minutes. Will continue to monitor.

## 2016-09-09 NOTE — Anesthesia Procedure Notes (Signed)
Procedure Performed: TEE Exam       Start Time:  09/09/2016 8:00 AM       End Time:   09/09/2016 11:12 AM    Preanesthesia Checklist:  Patient identified, IV assessed, risks and benefits discussed, monitors and equipment assessed, procedure being performed at surgeon's request and anesthesia consent obtained.    General Procedure Information  Diagnostic Indications for Echo:  hemodynamic monitoring  Location performed:  OR  Intubated  Bite block not placed  Heart visualized  Probe Insertion:  Easy  Probe Type:  Multiplane  Modalities:  2D only and pulse wave Doppler      Echocardiographic and Doppler Measurements    Ventricles    Right Ventricle:  Cavity size normal.  Hypertrophy not present.  Thrombus not present.  Global function normal.    Left Ventricle:  Cavity size normal.  Hypertrophy not present.  Global Function normal.  Ejection Fraction 60%.          Valves    Aortic Valve:  Annulus normal.  Stenosis not present.  Regurgitation absent.  Leaflets normal.      Mitral Valve:  Annulus normal.  Stenosis not present.  Regurgitation +1.  Leaflets normal.      Tricuspid Valve:  Annulus normal.  Stenosis not present.  Regurgitation +2.  Leaflets normal.  Leaflet motions normal.    Pulmonic Valve:  Annulus normal.  Stenosis not present.          Aorta    Ascending Aorta:  Size normal.    Aortic Arch:  Size normal.    Descending Aorta:  Size normal.          Atria    Right Atrium:  Size normal.    Left Atrium:  Size normal.  Left atrial appendage normal.      Septa    Atrial Septum:  Intra-atrial septal morphology normal.      Ventricular Septum:  Intra-ventricular septum morphology normal.          Other Findings  Pericardium:  normal  Pleural Effusion:  none  Pulmonary Arteries:  normal  Pulmonary Venous Flow:  normal    Anesthesia Information  Anesthesiologist:  Jarrett Albor LUKAS

## 2016-09-09 NOTE — Care Plan (Signed)
Problem: Patient Care Overview (Adult,OB)  Goal: Plan of Care Review(Adult,OB)  The patient and/or their representative will communicate an understanding of their plan of care   Outcome: Ongoing (see interventions/notes)  Goal: Individualization/Patient Specific Goal(Adult/OB)  Outcome: Ongoing (see interventions/notes)  Goal: Interdisciplinary Rounds/Family Conf  Outcome: Ongoing (see interventions/notes)    Problem: Cardiac: ACS (Acute Coronary Syndrome) (Adult)  Prevent and manage potential problems including: 1. cardiovascular structural defects 2. chest pain (angina) 3. dysrhythmia/arrhythmia 4. embolism 5. heart failure/shock 6. ischemia leading to infarction 7. pericarditis 8. situational response   Goal: Signs and Symptoms of Listed Potential Problems Will be Absent, Minimized or Managed (Cardiac: ACS)  Signs and symptoms of listed potential problems will be absent, minimized or managed by discharge/transition of care (reference Cardiac: ACS (Acute Coronary Syndrome) (Adult) CPG).   Outcome: Ongoing (see interventions/notes)    Problem: Fall Risk (Adult)  Goal: Absence of Falls  Patient will demonstrate the desired outcomes by discharge/transition of care.   Outcome: Ongoing (see interventions/notes)    Problem: Skin Injury Risk (Adult,Obstetrics,Pediatric)  Goal: Identify Related Risk Factors and Signs and Symptoms  Related risk factors and signs and symptoms are identified upon initiation of Human Response Clinical Practice Guideline (CPG).   Outcome: Ongoing (see interventions/notes)  Goal: Skin Health and Integrity  Patient will demonstrate the desired outcomes by discharge/transition of care.   Outcome: Ongoing (see interventions/notes)    Comments:   3/20: Robotic mid CAB out at 11:00 extubated on no gtts. Zofran administered for nausea. Plan of care reviewed with Patient.

## 2016-09-09 NOTE — Anesthesia OR-ICU Handoff (Signed)
Anesthesia ICU Transfer of Care  [name] [name] is a 51 y.o. ,male, Weight: 120.4 kg (265 lb 6.9 oz)   had Procedure(s) with comments:  Bypass Graft Coronary Artery Mid Robotic  Perfusion Charge/Stby/Cardiopulmonary Bypass  Autologous Blood Conservation Setup  Autologous Blood Conservation, Monitoring Per Hour - 9SE 3  performed  09/09/16   Primary Service: Ethelene Browns Roda-Renzel*    Past Medical History:   Diagnosis Date   . Asthma    . Coronary artery disease    . CPAP (continuous positive airway pressure) dependence    . Esophageal reflux    . Hypercholesterolemia    . Hypertension    . Knee injury    . Sleep apnea       Allergy History as of 09/09/16     PROMETHAZINE       Noted Status Severity Type Reaction    03/27/15 0657 Julio Alm, RN 01/25/13 Active High  Seizure    03/27/15 0657 Julio Alm, RN 01/25/13 Active   Seizure    01/25/13 1440 Mee Hives 01/25/13 Active                 I completed my ICU transfer of care/ Handoff to the ICU receiving personnel during which we discussed :  Access, Airway, All key and critical aspects of case discussed, Analgesia, Antibiotics, Expectation of post procedure, Fluids/Product, Gave opportunity for questions and acknowledgement of understanding, Labs and PMHx      Anesthesia Managment: This is for handoff only . For doses and times see MAR                                                                   Last OR Temp: Temperature: 36.1 C (97 F)  ABG:  PCO2 (PCO2P)   Date Value Ref Range Status   09/09/2016 49 (H) 35 - 45 mmHg Final     PCO2 (VENOUS)   Date Value Ref Range Status   04/22/2016 38.00 (L) 41.00 - 51.00 mm/Hg Final     PO2 (PO2P)   Date Value Ref Range Status   09/09/2016 252 (H) 72 - 100 mmHg Final     PO2 (VENOUS)   Date Value Ref Range Status   04/22/2016 28.0 (L) 35.0 - 50.0 mm/Hg Final     POTASSIUM   Date Value Ref Range Status   09/08/2016 3.9 3.5 - 5.1 mmol/L Final   04/30/2013 4.4 3.6 - 5.1 mEq/L Final     POTASSIUM, POC   Date Value  Ref Range Status   09/09/2016 4.4 3.5 - 5.0 mmol/L Final     CALCIUM   Date Value Ref Range Status   09/08/2016 8.6 8.5 - 10.2 mg/dL Final   16/03/9603 9.3 8.9 - 10.3 mg/dL Final     Calculated P Axis   Date Value Ref Range Status   09/07/2016 35 degrees Final     Calculated R Axis   Date Value Ref Range Status   09/06/2016 39 degrees Final     Calculated T Axis   Date Value Ref Range Status   09/07/2016 55 degrees Final     IONIZED CALCIUM, POC   Date Value Ref Range Status   09/09/2016 1.14 (L) 1.30 - 1.46 mmol/L Final  LACTATE   Date Value Ref Range Status   04/22/2016 1.6 (H) 0.0 - 1.3 mmol/L Final     BASE EXCESS   Date Value Ref Range Status   04/22/2016 0.3 -3.0 - 3.0 mmol/L Final     BASE EXCESS (BEP)   Date Value Ref Range Status   09/09/2016 0.0 -2.0 - 3.0 mmol/L Final     HCO3 (HCO3P)   Date Value Ref Range Status   09/09/2016 26 22 - 26 mmol/L Final     BICARBONATE (VENOUS)   Date Value Ref Range Status   04/22/2016 24.0 22.0 - 26.0 mmol/L Final     %FIO2 (VENOUS)   Date Value Ref Range Status   04/22/2016 21.0 % Final     Airway:

## 2016-09-09 NOTE — Nurses Notes (Signed)
RN called unit clerks to allow wife back for visitation but they were unable to locate her at this time. Will continue to monitor.

## 2016-09-10 ENCOUNTER — Encounter (HOSPITAL_COMMUNITY)
Admission: EM | Disposition: A | Discharge status: Home Health | Payer: Self-pay | Source: Other Acute Inpatient Hospital | Attending: Specialist

## 2016-09-10 ENCOUNTER — Inpatient Hospital Stay (HOSPITAL_COMMUNITY): Payer: BC Managed Care – PPO | Admitting: Student in an Organized Health Care Education/Training Program

## 2016-09-10 ENCOUNTER — Inpatient Hospital Stay (HOSPITAL_COMMUNITY): Payer: BC Managed Care – PPO

## 2016-09-10 ENCOUNTER — Inpatient Hospital Stay (HOSPITAL_COMMUNITY): Payer: BC Managed Care – PPO | Admitting: ANESTHESIOLOGY

## 2016-09-10 ENCOUNTER — Inpatient Hospital Stay (HOSPITAL_COMMUNITY)
Admission: RE | Admit: 2016-09-10 | Discharge: 2016-09-10 | Disposition: A | Discharge status: Home / Self Care | Payer: BC Managed Care – PPO | Source: Ambulatory Visit

## 2016-09-10 DIAGNOSIS — I97610 Postprocedural hemorrhage and hematoma of a circulatory system organ or structure following a cardiac catheterization: Secondary | ICD-10-CM

## 2016-09-10 DIAGNOSIS — D62 Acute posthemorrhagic anemia: Secondary | ICD-10-CM

## 2016-09-10 DIAGNOSIS — Z951 Presence of aortocoronary bypass graft: Secondary | ICD-10-CM

## 2016-09-10 DIAGNOSIS — I25810 Atherosclerosis of coronary artery bypass graft(s) without angina pectoris: Secondary | ICD-10-CM

## 2016-09-10 DIAGNOSIS — Z01810 Encounter for preprocedural cardiovascular examination: Secondary | ICD-10-CM

## 2016-09-10 DIAGNOSIS — R0602 Shortness of breath: Secondary | ICD-10-CM

## 2016-09-10 DIAGNOSIS — Z9889 Other specified postprocedural states: Secondary | ICD-10-CM

## 2016-09-10 DIAGNOSIS — J942 Hemothorax: Secondary | ICD-10-CM

## 2016-09-10 DIAGNOSIS — I97611 Postprocedural hemorrhage and hematoma of a circulatory system organ or structure following cardiac bypass: Secondary | ICD-10-CM

## 2016-09-10 LAB — ARTERIAL BLOOD GAS/LACTATE/CO-OX/LYTES (NA/K/CA/CL/GLUC) - ORS ONLY
%FIO2 (ARTERIAL): 40 %
%FIO2 (ARTERIAL): 40 %
%FIO2 (ARTERIAL): 50 %
%FIO2 (ARTERIAL): 60 %
BASE DEFICIT: 3.4 mmol/L — ABNORMAL HIGH (ref 0.0–3.0)
BASE DEFICIT: 3.8 mmol/L — ABNORMAL HIGH (ref 0.0–3.0)
BASE DEFICIT: 4 mmol/L — ABNORMAL HIGH (ref 0.0–3.0)
BASE DEFICIT: 5 mmol/L — ABNORMAL HIGH (ref 0.0–3.0)
BASE DEFICIT: 5.6 mmol/L — ABNORMAL HIGH (ref 0.0–3.0)
BICARBONATE (ARTERIAL): 20.5 mmol/L (ref 18.0–26.0)
BICARBONATE (ARTERIAL): 21 mmol/L (ref 18.0–26.0)
BICARBONATE (ARTERIAL): 21.7 mmol/L (ref 18.0–26.0)
BICARBONATE (ARTERIAL): 22 mmol/L (ref 18.0–26.0)
BICARBONATE (ARTERIAL): 22.2 mmol/L (ref 18.0–26.0)
CARBOXYHEMOGLOBIN: 1.7 % (ref 0.0–2.5)
CARBOXYHEMOGLOBIN: 1.9 % (ref 0.0–2.5)
CARBOXYHEMOGLOBIN: 2.5 % (ref 0.0–2.5)
CARBOXYHEMOGLOBIN: 2.5 % (ref 0.0–2.5)
CARBOXYHEMOGLOBIN: 2.6 % — ABNORMAL HIGH (ref 0.0–2.5)
CHLORIDE: 104 mmol/L (ref 96–111)
CHLORIDE: 107 mmol/L (ref 96–111)
CHLORIDE: 107 mmol/L (ref 96–111)
CHLORIDE: 108 mmol/L (ref 96–111)
CHLORIDE: 109 mmol/L (ref 96–111)
GLUCOSE: 149 mg/dL — ABNORMAL HIGH (ref 60–105)
GLUCOSE: 149 mg/dL — ABNORMAL HIGH (ref 60–105)
GLUCOSE: 167 mg/dL — ABNORMAL HIGH (ref 60–105)
GLUCOSE: 189 mg/dL — ABNORMAL HIGH (ref 60–105)
GLUCOSE: 200 mg/dL — ABNORMAL HIGH (ref 60–105)
HEMOGLOBIN: 10.6 g/dL — ABNORMAL LOW (ref 12.0–18.0)
HEMOGLOBIN: 14.1 g/dL (ref 12.0–18.0)
HEMOGLOBIN: 7 g/dL — ABNORMAL LOW (ref 12.0–18.0)
HEMOGLOBIN: 8.6 g/dL — ABNORMAL LOW (ref 12.0–18.0)
HEMOGLOBIN: 9.8 g/dL — ABNORMAL LOW (ref 12.0–18.0)
IONIZED CALCIUM: 1.14 mmol/L (ref 1.10–1.30)
IONIZED CALCIUM: 1.14 mmol/L (ref 1.10–1.30)
IONIZED CALCIUM: 1.15 mmol/L (ref 1.10–1.30)
IONIZED CALCIUM: 1.23 mmol/L (ref 1.10–1.30)
IONIZED CALCIUM: 1.25 mmol/L (ref 1.10–1.30)
LACTATE: 1.3 mmol/L (ref 0.0–1.3)
LACTATE: 1.8 mmol/L — ABNORMAL HIGH (ref 0.0–1.3)
LACTATE: 2.1 mmol/L — ABNORMAL HIGH (ref 0.0–1.3)
LACTATE: 3.7 mmol/L — ABNORMAL HIGH (ref 0.0–1.3)
LACTATE: 4.5 mmol/L — ABNORMAL HIGH (ref 0.0–1.3)
MET-HEMOGLOBIN: 1.1 % (ref 0.0–3.5)
MET-HEMOGLOBIN: 1.1 % (ref 0.0–3.5)
MET-HEMOGLOBIN: 1.3 % (ref 0.0–3.5)
MET-HEMOGLOBIN: 1.3 % (ref 0.0–3.5)
MET-HEMOGLOBIN: 2 % (ref 0.0–3.5)
O2CT: 11.4 % — ABNORMAL LOW (ref 15.7–24.3)
O2CT: 12.9 % — ABNORMAL LOW (ref 15.7–24.3)
O2CT: 14.4 % — ABNORMAL LOW (ref 15.7–24.3)
O2CT: 18.8 % (ref 15.7–24.3)
O2CT: 9.6 % — ABNORMAL LOW (ref 15.7–24.3)
OXYHEMOGLOBIN: 92.9 % (ref 85.0–98.0)
OXYHEMOGLOBIN: 93.1 % (ref 85.0–98.0)
OXYHEMOGLOBIN: 94.5 % (ref 85.0–98.0)
OXYHEMOGLOBIN: 95.7 % (ref 85.0–98.0)
OXYHEMOGLOBIN: 96 % (ref 85.0–98.0)
PAO2/FIO2 RATIO: 165 (ref <=200)
PAO2/FIO2 RATIO: 160 (ref <=200)
PCO2 (ARTERIAL): 32 mmHg — ABNORMAL LOW (ref 35.0–45.0)
PCO2 (ARTERIAL): 34 mmHg — ABNORMAL LOW (ref 35.0–45.0)
PCO2 (ARTERIAL): 37 mmHg (ref 35.0–45.0)
PCO2 (ARTERIAL): 43 mmHg (ref 35.0–45.0)
PCO2 (ARTERIAL): 48 mmHg — ABNORMAL HIGH (ref 35.0–45.0)
PH (ARTERIAL): 7.29 — ABNORMAL LOW (ref 7.35–7.45)
PH (ARTERIAL): 7.32 — ABNORMAL LOW (ref 7.35–7.45)
PH (ARTERIAL): 7.36 (ref 7.35–7.45)
PH (ARTERIAL): 7.36 (ref 7.35–7.45)
PH (ARTERIAL): 7.39 (ref 7.35–7.45)
PO2 (ARTERIAL): 66 mmHg — ABNORMAL LOW (ref 72.0–100.0)
PO2 (ARTERIAL): 71 mmHg — ABNORMAL LOW (ref 72.0–100.0)
PO2 (ARTERIAL): 80 mmHg (ref 72.0–100.0)
PO2 (ARTERIAL): 84 mmHg (ref 72.0–100.0)
PO2 (ARTERIAL): 97 mmHg (ref 72.0–100.0)
SODIUM: 132 mmol/L — ABNORMAL LOW (ref 136–145)
SODIUM: 132 mmol/L — ABNORMAL LOW (ref 136–145)
SODIUM: 135 mmol/L — ABNORMAL LOW (ref 136–145)
SODIUM: 137 mmol/L (ref 136–145)
SODIUM: 137 mmol/L (ref 136–145)
WHOLE BLOOD POTASSIUM: 4.2 mmol/L (ref 3.5–5.0)
WHOLE BLOOD POTASSIUM: 4.5 mmol/L (ref 3.5–5.0)
WHOLE BLOOD POTASSIUM: 4.7 mmol/L (ref 3.5–5.0)
WHOLE BLOOD POTASSIUM: 4.7 mmol/L (ref 3.5–5.0)
WHOLE BLOOD POTASSIUM: 4.9 mmol/L (ref 3.5–5.0)

## 2016-09-10 LAB — BASIC METABOLIC PANEL
ANION GAP: 13 mmol/L (ref 4–13)
ANION GAP: 8 mmol/L (ref 4–13)
ANION GAP: 14 mmol/L — ABNORMAL HIGH (ref 4–13)
ANION GAP: 9 mmol/L (ref 4–13)
BUN/CREA RATIO: 20 (ref 6–22)
BUN/CREA RATIO: 20 (ref 6–22)
BUN/CREA RATIO: 12 (ref 6–22)
BUN/CREA RATIO: 15 (ref 6–22)
BUN: 22 mg/dL (ref 8–25)
BUN: 24 mg/dL (ref 8–25)
BUN: 30 mg/dL — ABNORMAL HIGH (ref 8–25)
BUN: 30 mg/dL — ABNORMAL HIGH (ref 8–25)
CALCIUM: 8.4 mg/dL — ABNORMAL LOW (ref 8.5–10.2)
CALCIUM: 8.6 mg/dL (ref 8.5–10.2)
CALCIUM: 8.3 mg/dL — ABNORMAL LOW (ref 8.5–10.2)
CALCIUM: 8.4 mg/dL — ABNORMAL LOW (ref 8.5–10.2)
CHLORIDE: 106 mmol/L (ref 96–111)
CHLORIDE: 108 mmol/L (ref 96–111)
CHLORIDE: 107 mmol/L (ref 96–111)
CHLORIDE: 110 mmol/L (ref 96–111)
CO2 TOTAL: 17 mmol/L — ABNORMAL LOW (ref 22–32)
CO2 TOTAL: 22 mmol/L (ref 22–32)
CO2 TOTAL: 17 mmol/L — ABNORMAL LOW (ref 22–32)
CO2 TOTAL: 20 mmol/L — ABNORMAL LOW (ref 22–32)
CREATININE: 1.09 mg/dL (ref 0.62–1.27)
CREATININE: 1.22 mg/dL (ref 0.62–1.27)
CREATININE: 2.42 mg/dL — ABNORMAL HIGH (ref 0.62–1.27)
CREATININE: 2.01 mg/dL — ABNORMAL HIGH (ref 0.62–1.27)
ESTIMATED GFR: >59 mL/min/1.73mˆ2 (ref >59)
ESTIMATED GFR: >59 mL/min/1.73mˆ2 (ref >59)
ESTIMATED GFR: 30 mL/min/1.73mˆ2 — ABNORMAL LOW (ref >59)
ESTIMATED GFR: 38 mL/min/1.73mˆ2 — ABNORMAL LOW (ref >59)
GLUCOSE: 189 mg/dL — ABNORMAL HIGH (ref 65–139)
GLUCOSE: 189 mg/dL — ABNORMAL HIGH (ref 65–139)
GLUCOSE: 152 mg/dL — ABNORMAL HIGH (ref 65–139)
GLUCOSE: 142 mg/dL — ABNORMAL HIGH (ref 65–139)
GLUCOSE: 163 mg/dL — ABNORMAL HIGH (ref 65–139)
POTASSIUM: 4.9 mmol/L (ref 3.5–5.1)
POTASSIUM: 4.4 mmol/L (ref 3.5–5.1)
POTASSIUM: 4.6 mmol/L (ref 3.5–5.1)
POTASSIUM: 4.7 mmol/L (ref 3.5–5.1)
SODIUM: 136 mmol/L (ref 136–145)
SODIUM: 138 mmol/L (ref 136–145)
SODIUM: 138 mmol/L (ref 136–145)
SODIUM: 139 mmol/L (ref 136–145)

## 2016-09-10 LAB — CBC
HCT: 30.6 % — ABNORMAL LOW (ref 36.7–47.0)
HCT: 20.6 % — ABNORMAL LOW (ref 36.7–47.0)
HCT: 26.2 % — ABNORMAL LOW (ref 36.7–47.0)
HGB: 10.3 g/dL — ABNORMAL LOW (ref 12.5–16.3)
HGB: 7.1 g/dL — ABNORMAL LOW (ref 12.5–16.3)
HGB: 8.8 g/dL — ABNORMAL LOW (ref 12.5–16.3)
MCH: 29.2 pg (ref 27.4–33.0)
MCH: 29.7 pg (ref 27.4–33.0)
MCH: 29.3 pg (ref 27.4–33.0)
MCHC: 33.8 g/dL (ref 32.5–35.8)
MCHC: 34.3 g/dL (ref 32.5–35.8)
MCHC: 33.7 g/dL (ref 32.5–35.8)
MCV: 86.5 fL (ref 78.0–100.0)
MCV: 86.4 fL (ref 78.0–100.0)
MCV: 87 fL (ref 78.0–100.0)
MPV: 8.4 fL (ref 7.5–11.5)
MPV: 8.9 fL (ref 7.5–11.5)
MPV: 8.9 fL (ref 7.5–11.5)
PLATELETS: 189 x10ˆ3/uL (ref 140–450)
PLATELETS: 202 x10ˆ3/uL (ref 140–450)
PLATELETS: 139 x10?3/uL — ABNORMAL LOW (ref 140–450)
RBC: 3.54 x10ˆ6/uL — ABNORMAL LOW (ref 4.06–5.63)
RBC: 2.39 x10ˆ6/uL — ABNORMAL LOW (ref 4.06–5.63)
RBC: 3.01 x10?6/uL — ABNORMAL LOW (ref 4.06–5.63)
RDW: 13.2 % (ref 12.0–15.0)
RDW: 13.1 % (ref 12.0–15.0)
RDW: 13.5 % (ref 12.0–15.0)
WBC: 13.7 x10ˆ3/uL — ABNORMAL HIGH (ref 3.5–11.0)
WBC: 16.2 x10ˆ3/uL — ABNORMAL HIGH (ref 3.5–11.0)
WBC: 18.4 x10ˆ3/uL — ABNORMAL HIGH (ref 3.5–11.0)

## 2016-09-10 LAB — POCT ISTAT CG8 BLOOD GAS ELECTROLYTES HEMATOCRIT (RESULTS)
BASE EXCESS (BEP): -4 mmol/L — ABNORMAL LOW (ref ?–2.0)
BASE EXCESS (BEP): -7 mmol/L — ABNORMAL LOW (ref ?–2.0)
GLUCOSE, POC: 171 mg/dL — ABNORMAL HIGH (ref 70–105)
GLUCOSE, POC: 134 mg/dL — ABNORMAL HIGH (ref 70–105)
HCO3 (HCO3P): 22 mmol/L (ref 22–26)
HCO3 (HCO3P): 19 mmol/L — ABNORMAL LOW (ref 22–26)
HEMATOCRIT, POC: 24 % — ABNORMAL LOW (ref 39.8–50.2)
HEMATOCRIT, POC: 19 % — ABNORMAL LOW (ref 39.8–50.2)
HEMOGLOBIN, POC: 8.2 g/dL — ABNORMAL LOW (ref 11.5–16.5)
HEMOGLOBIN, POC: 6.5 g/dL — CL (ref 11.5–16.5)
IONIZED CALCIUM, POC: 1.09 mmol/L — ABNORMAL LOW (ref 1.30–1.46)
IONIZED CALCIUM, POC: 1.1 mmol/L — ABNORMAL LOW (ref 1.30–1.46)
PCO2 (PCO2P): 46 mmHg — ABNORMAL HIGH (ref 35–45)
PCO2 (PCO2P): 38 mmHg (ref 35–45)
PH (PHP): 7.29 — ABNORMAL LOW (ref 7.35–7.45)
PH (PHP): 7.31 — ABNORMAL LOW (ref 7.35–7.45)
PO2 (PO2P): 110 mmHg — ABNORMAL HIGH (ref 72–100)
PO2 (PO2P): 97 mmHg (ref 72–100)
POTASSIUM, POC: 4.3 mmol/L (ref 3.5–5.0)
POTASSIUM, POC: 3.8 mmol/L (ref 3.5–5.0)
SO2 (SO2P): 98 % (ref >=80)
SO2 (SO2P): 97 % (ref >=80)
SODIUM, POC: 139 mmol/L (ref 136–145)
SODIUM, POC: 143 mmol/L (ref 136–145)
TCO2 (TOC2P): 23 mmol/L (ref 23–33)
TCO2 (TOC2P): 20 mmol/L — ABNORMAL LOW (ref 23–33)

## 2016-09-10 LAB — MAGNESIUM
MAGNESIUM: 1.9 mg/dL (ref 1.6–2.5)
MAGNESIUM: 1.9 mg/dL (ref 1.6–2.5)
MAGNESIUM: 2.6 mg/dL — ABNORMAL HIGH (ref 1.6–2.5)

## 2016-09-10 LAB — ARTERIAL BLOOD GAS/LACTATE/CO-OX/LYTES (NA/K/CA/CL/GLUC)
%FIO2 (ARTERIAL): 60 %
PAO2/FIO2 RATIO: 118 (ref <=200)
PAO2/FIO2 RATIO: 162 (ref <=200)
PAO2/FIO2 RATIO: 210 (ref <=200)

## 2016-09-10 LAB — CBC WITH DIFF
BASOPHIL #: 0.12 x10ˆ3/uL (ref 0.00–0.20)
BASOPHIL %: 1 %
EOSINOPHIL #: 0 x10ˆ3/uL (ref 0.00–0.50)
EOSINOPHIL %: 0 %
EOSINOPHIL %: 0 %
HCT: 29.7 % — ABNORMAL LOW (ref 36.7–47.0)
HCT: 39.6 % (ref 36.7–47.0)
HGB: 13.7 g/dL (ref 12.5–16.3)
HGB: 9.9 g/dL — ABNORMAL LOW (ref 12.5–16.3)
LYMPHOCYTE #: 1.11 x10ˆ3/uL (ref 1.00–4.80)
LYMPHOCYTE %: 6 %
MCH: 29 pg (ref 27.4–33.0)
MCH: 29.4 pg (ref 27.4–33.0)
MCHC: 33.4 g/dL (ref 32.5–35.8)
MCHC: 34.5 g/dL (ref 32.5–35.8)
MCV: 85.2 fL (ref 78.0–100.0)
MCV: 85.2 fL (ref 78.0–100.0)
MCV: 86.9 fL (ref 78.0–100.0)
MONOCYTE #: 1.71 x10ˆ3/uL — ABNORMAL HIGH (ref 0.30–1.00)
MONOCYTE %: 9 %
MPV: 8.8 fL (ref 7.5–11.5)
MPV: 8.8 fL (ref 7.5–11.5)
NEUTROPHIL #: 16.7 x10ˆ3/uL — ABNORMAL HIGH (ref 1.50–7.70)
NEUTROPHIL %: 85 %
PLATELETS: 283 x10ˆ3/uL (ref 140–450)
PLATELETS: 285 x10ˆ3/uL (ref 140–450)
RBC: 3.42 x10ˆ6/uL — ABNORMAL LOW (ref 4.06–5.63)
RBC: 4.65 x10ˆ6/uL (ref 4.06–5.63)
RDW: 13 % (ref 12.0–15.0)
RDW: 13 % (ref 12.0–15.0)
WBC: 19.6 x10ˆ3/uL — ABNORMAL HIGH (ref 3.5–11.0)
WBC: 21.2 x10ˆ3/uL — ABNORMAL HIGH (ref 3.5–11.0)

## 2016-09-10 LAB — IONIZED CALCIUM WITH PH
IONIZED CALCIUM: 1.15 mmol/L (ref 1.10–1.36)
PH (VENOUS): 7.45 — ABNORMAL HIGH (ref 7.31–7.41)

## 2016-09-10 LAB — MANUAL DIFFERENTIAL (CELLAVISION)
BASOPHIL #: 0 x10ˆ3/uL (ref 0.00–0.20)
BASOPHIL %: 0 %
EOSINOPHIL #: 0 x10ˆ3/uL (ref 0.00–0.50)
EOSINOPHIL %: 0 %
LYMPHOCYTE #: 1.25 x10ˆ3/uL (ref 1.00–4.80)
LYMPHOCYTE %: 6 %
MONOCYTE #: 2.9 x10ˆ3/uL — ABNORMAL HIGH (ref 0.30–1.00)
MONOCYTE %: 14 %
NEUTROPHIL #: 17.05 x10ˆ3/uL — ABNORMAL HIGH (ref 1.50–7.70)
NEUTROPHIL %: 80 %

## 2016-09-10 LAB — FIBRINOGEN
FIBRINOGEN: 342 mg/dL (ref 200–400)
FIBRINOGEN: 242 mg/dL (ref 200–400)
FIBRINOGEN: 253 mg/dL (ref 200–400)

## 2016-09-10 LAB — PT/INR
INR: 1.06 (ref 0.80–1.20)
INR: 1.07 (ref 0.80–1.20)
INR: 1.13 (ref 0.80–1.20)
PROTHROMBIN TIME: 12.3 s (ref 9.3–13.9)
PROTHROMBIN TIME: 12.4 s (ref 9.3–13.9)
PROTHROMBIN TIME: 13.1 s (ref 9.3–13.9)

## 2016-09-10 LAB — POC BLOOD GLUCOSE (RESULTS)
GLUCOSE, POC: 169 mg/dL — ABNORMAL HIGH (ref 70–105)
GLUCOSE, POC: 153 mg/dL — ABNORMAL HIGH (ref 70–105)
GLUCOSE, POC: 203 mg/dL — ABNORMAL HIGH (ref 70–105)
GLUCOSE, POC: 174 mg/dL — ABNORMAL HIGH (ref 70–105)

## 2016-09-10 LAB — PTT (PARTIAL THROMBOPLASTIN TIME)
APTT: 23.8 s — ABNORMAL LOW (ref 25.1–36.5)
APTT: 27.3 s (ref 25.1–36.5)
APTT: 28.1 s (ref 25.1–36.5)

## 2016-09-10 LAB — PHOSPHORUS: PHOSPHORUS: 4.8 mg/dL — ABNORMAL HIGH (ref 2.4–4.7)

## 2016-09-10 SURGERY — STERNOTOMY MEDIASTINAL
Anesthesia: General

## 2016-09-10 SURGERY — AUTOLOGOUS BLOOD CONSERVATION SETUP
Wound class: N/A - Imaging Only

## 2016-09-10 MED ORDER — SODIUM CHLORIDE 0.9 % INTRAVENOUS SOLUTION
1.0000 g | INJECTION | INTRAVENOUS | Status: DC | PRN
Start: 2016-09-10 — End: 2016-09-10

## 2016-09-10 MED ORDER — POTASSIUM CHLORIDE 20 MEQ/50 ML IN STERILE WATER INTRAVENOUS PIGGYBACK
20.0000 meq | INJECTION | Freq: Once | INTRAVENOUS | Status: AC
Start: 2016-09-10 — End: 2016-09-10
  Administered 2016-09-10: 20 meq via INTRAVENOUS
  Administered 2016-09-10: 0 meq via INTRAVENOUS
  Filled 2016-09-10: qty 50

## 2016-09-10 MED ORDER — ALBUMIN, HUMAN 5 % INTRAVENOUS SOLUTION
INTRAVENOUS | Status: DC | PRN
Start: 2016-09-10 — End: 2016-09-10

## 2016-09-10 MED ORDER — THROMBIN (RECOMBINANT) 5,000 UNIT TOPICAL SOLUTION
CUTANEOUS | Status: AC
Start: 2016-09-10 — End: 2016-09-10
  Filled 2016-09-10: qty 1

## 2016-09-10 MED ORDER — LACTATED RINGERS INTRAVENOUS SOLUTION
INTRAVENOUS | Status: DC
Start: 2016-09-10 — End: 2016-09-11

## 2016-09-10 MED ORDER — SODIUM CHLORIDE 0.9 % IRRIGATION SOLUTION
Freq: Once | Status: DC | PRN
Start: 2016-09-10 — End: 2016-09-10
  Administered 2016-09-10: 4000 mL

## 2016-09-10 MED ORDER — MORPHINE 4 MG/ML INTRAVENOUS CARTRIDGE
2.0000 mg | CARTRIDGE | INTRAVENOUS | Status: AC
Start: 2016-09-10 — End: 2016-09-10
  Administered 2016-09-10: 2 mg via INTRAVENOUS
  Filled 2016-09-10: qty 1

## 2016-09-10 MED ORDER — SODIUM CHLORIDE 0.9 % IRRIGATION SOLUTION
Freq: Once | Status: DC | PRN
Start: 2016-09-10 — End: 2016-09-10
  Administered 2016-09-10: 2000 mL

## 2016-09-10 MED ORDER — SODIUM CHLORIDE 0.9 % INTRAVENOUS SOLUTION
INTRAVENOUS | Status: AC
Start: 2016-09-10 — End: 2016-09-10
  Filled 2016-09-10: qty 30

## 2016-09-10 MED ORDER — VASOPRESSIN 20 UNIT/ML INTRAVENOUS SOLUTION
INTRAVENOUS | Status: AC
Start: 2016-09-10 — End: 2016-09-10
  Administered 2016-09-10: 20 [IU]
  Filled 2016-09-10: qty 1

## 2016-09-10 MED ORDER — MORPHINE 4 MG/ML INTRAVENOUS CARTRIDGE
2.0000 mg | CARTRIDGE | INTRAVENOUS | Status: AC
Start: 2016-09-10 — End: 2016-09-11

## 2016-09-10 MED ORDER — ROCURONIUM 10 MG/ML INTRAVENOUS SOLUTION
Freq: Once | INTRAVENOUS | Status: DC | PRN
Start: 2016-09-10 — End: 2016-09-10
  Administered 2016-09-10: 100 mg via INTRAVENOUS

## 2016-09-10 MED ORDER — SODIUM CHLORIDE 0.9 % INTRAVENOUS SOLUTION
0.40 ug/kg/h | INTRAVENOUS | Status: DC
Start: 2016-09-10 — End: 2016-09-11
  Administered 2016-09-10: 0.4 ug/kg/h via INTRAVENOUS
  Administered 2016-09-10: 0 ug/kg/h via INTRAVENOUS
  Filled 2016-09-10: qty 0.8

## 2016-09-10 MED ORDER — SODIUM CHLORIDE 0.9 % IV BOLUS
40.0000 mL | INJECTION | Freq: Once | Status: AC | PRN
Start: 2016-09-10 — End: 2016-09-11
  Administered 2016-09-10: 40 mL via INTRAVENOUS
  Administered 2016-09-10: 0 mL via INTRAVENOUS

## 2016-09-10 MED ORDER — SODIUM CHLORIDE 0.9 % INTRAVENOUS SOLUTION
INTRAVENOUS | Status: DC | PRN
Start: 2016-09-10 — End: 2016-09-10

## 2016-09-10 MED ORDER — PHENYLEPHRINE 0.5 MG/5 ML (100 MCG/ML)IN 0.9 % SOD.CHLORIDE IV SYRINGE
INJECTION | Freq: Once | INTRAVENOUS | Status: DC | PRN
Start: 2016-09-10 — End: 2016-09-10
  Administered 2016-09-10 (×2): 100 ug via INTRAVENOUS

## 2016-09-10 MED ORDER — SODIUM CHLORIDE 0.9 % IV BOLUS
40.0000 mL | INJECTION | Freq: Once | Status: AC | PRN
Start: 2016-09-10 — End: 2016-09-11
  Administered 2016-09-10: 0 mL via INTRAVENOUS
  Administered 2016-09-10: 40 mL via INTRAVENOUS

## 2016-09-10 MED ORDER — MIDAZOLAM 1 MG/ML INJECTION SOLUTION
Freq: Once | INTRAMUSCULAR | Status: DC | PRN
Start: 2016-09-10 — End: 2016-09-10
  Administered 2016-09-10 (×2): 2 mg via INTRAVENOUS

## 2016-09-10 MED ORDER — NITROGLYCERIN 100 MCG/ML IN D5W INJECTION
INJECTION | INTRAVENOUS | Status: AC
Start: 2016-09-10 — End: 2016-09-10
  Filled 2016-09-10: qty 10

## 2016-09-10 MED ORDER — NICARDIPINE 25 MG/10 ML INTRAVENOUS SOLUTION
INTRAVENOUS | Status: AC
Start: 2016-09-10 — End: 2016-09-11
  Filled 2016-09-10: qty 20

## 2016-09-10 MED ORDER — BACITRACIN 50,000 UNIT INTRAMUSCULAR SOLUTION
Freq: Once | INTRAMUSCULAR | Status: DC | PRN
Start: 2016-09-10 — End: 2016-09-10
  Administered 2016-09-10: 50000 [IU]

## 2016-09-10 MED ORDER — FENTANYL (PF) 50 MCG/ML INJECTION SOLUTION
Freq: Once | INTRAMUSCULAR | Status: DC | PRN
Start: 2016-09-10 — End: 2016-09-10
  Administered 2016-09-10: 150 ug via INTRAVENOUS
  Administered 2016-09-10: 100 ug via INTRAVENOUS
  Administered 2016-09-10: 250 ug via INTRAVENOUS

## 2016-09-10 MED ORDER — PROPOFOL 10 MG/ML IV BOLUS
INJECTION | Freq: Once | INTRAVENOUS | Status: DC | PRN
Start: 2016-09-10 — End: 2016-09-10
  Administered 2016-09-10: 60 mg via INTRAVENOUS

## 2016-09-10 MED ORDER — LIDOCAINE (PF) 100 MG/5 ML (2 %) INTRAVENOUS SYRINGE
INJECTION | Freq: Once | INTRAVENOUS | Status: DC | PRN
Start: 2016-09-10 — End: 2016-09-10
  Administered 2016-09-10: 100 mg via INTRAVENOUS

## 2016-09-10 MED ORDER — SODIUM CHLORIDE 0.9 % IV BOLUS
40.0000 mL | INJECTION | Freq: Once | Status: AC | PRN
Start: 2016-09-10 — End: 2016-09-10

## 2016-09-10 MED ORDER — ROCURONIUM 10 MG/ML INTRAVENOUS SOLUTION
Freq: Once | INTRAVENOUS | Status: DC | PRN
Start: 2016-09-10 — End: 2016-09-10
  Administered 2016-09-10 (×2): 50 mg via INTRAVENOUS

## 2016-09-10 MED ORDER — ALBUMIN, HUMAN 5 % INTRAVENOUS SOLUTION
12.5000 g | INTRAVENOUS | Status: AC
Start: 2016-09-10 — End: 2016-09-10
  Administered 2016-09-10: 0 g via INTRAVENOUS
  Administered 2016-09-10: 12.5 g via INTRAVENOUS

## 2016-09-10 MED ORDER — VASOPRESSIN 20 UNIT/ML INTRAVENOUS SOLUTION
Freq: Once | INTRAVENOUS | Status: DC | PRN
Start: 2016-09-10 — End: 2016-09-10
  Administered 2016-09-10: 3 [IU] via INTRAVENOUS
  Administered 2016-09-10: 2 [IU] via INTRAVENOUS
  Administered 2016-09-10: 1 [IU] via INTRAVENOUS

## 2016-09-10 MED ORDER — MAGNESIUM SULFATE 4 GRAM/100 ML (4 %) IN WATER INTRAVENOUS PIGGYBACK
4.0000 g | INJECTION | Freq: Once | INTRAVENOUS | Status: AC
Start: 2016-09-10 — End: 2016-09-10
  Administered 2016-09-10: 0 g via INTRAVENOUS
  Administered 2016-09-10 (×2): 4 g via INTRAVENOUS
  Filled 2016-09-10: qty 100

## 2016-09-10 MED ORDER — SODIUM CHLORIDE 0.9 % IV BOLUS
40.00 mL | INJECTION | Freq: Once | Status: AC | PRN
Start: 2016-09-10 — End: 2016-09-10

## 2016-09-10 MED ORDER — CEFAZOLIN 1 GRAM SOLUTION FOR INJECTION
Freq: Once | INTRAMUSCULAR | Status: DC | PRN
Start: 2016-09-10 — End: 2016-09-10
  Administered 2016-09-10: 3000 mg via INTRAVENOUS

## 2016-09-10 MED ORDER — PHENYLEPHRINE 0.5 MG/5 ML (100 MCG/ML)IN 0.9 % SOD.CHLORIDE IV SYRINGE
INJECTION | Freq: Once | INTRAVENOUS | Status: DC | PRN
Start: 2016-09-10 — End: 2016-09-10
  Administered 2016-09-10: 300 ug via INTRAVENOUS

## 2016-09-10 MED ORDER — HYDROMORPHONE 2 MG/ML INJECTION SYRINGE
INJECTION | INTRAMUSCULAR | Status: AC
Start: 2016-09-10 — End: 2016-09-10
  Administered 2016-09-10: 0.2 mg via INTRAVENOUS
  Filled 2016-09-10: qty 1

## 2016-09-10 MED ORDER — PAPAVERINE 30 MG/ML INJECTION SOLUTION
INTRAMUSCULAR | Status: AC
Start: 2016-09-10 — End: 2016-09-10
  Filled 2016-09-10: qty 4

## 2016-09-10 MED ORDER — CALCIUM GLUCONATE 100 MG/ML (10 %) INTRAVENOUS SOLUTION
1000.0000 mg | Freq: Once | INTRAVENOUS | Status: AC
Start: 2016-09-10 — End: 2016-09-10
  Administered 2016-09-10: 1000 mg via INTRAVENOUS
  Filled 2016-09-10: qty 10

## 2016-09-10 MED ORDER — CALCIUM CHLORIDE 100 MG/ML (10 %) INTRAVENOUS SYRINGE
INJECTION | Freq: Once | INTRAVENOUS | Status: DC | PRN
Start: 2016-09-10 — End: 2016-09-10
  Administered 2016-09-10: 400 mg via INTRAVENOUS
  Administered 2016-09-10 (×2): 300 mg via INTRAVENOUS

## 2016-09-10 MED ORDER — FENTANYL (PF) 50 MCG/ML INJECTION SOLUTION
Freq: Once | INTRAMUSCULAR | Status: DC | PRN
Start: 2016-09-10 — End: 2016-09-10
  Administered 2016-09-10: 100 ug via INTRAVENOUS

## 2016-09-10 MED ORDER — CALCIUM CHLORIDE 100 MG/ML (10 %) INTRAVENOUS SYRINGE
INJECTION | Freq: Once | INTRAVENOUS | Status: DC | PRN
Start: 2016-09-10 — End: 2016-09-10
  Administered 2016-09-10 (×3): 250 mg via INTRAVENOUS

## 2016-09-10 MED ORDER — HEPARIN (PORCINE) (PF) 1,000 UNIT/500 ML IN 0.9 % SODIUM CHLORIDE IV
INTRAVENOUS | Status: AC
Start: 2016-09-10 — End: 2016-09-10
  Filled 2016-09-10: qty 500

## 2016-09-10 MED ORDER — ALBUMIN, HUMAN 5 % INTRAVENOUS SOLUTION
12.5000 g | Freq: Once | INTRAVENOUS | Status: AC
Start: 2016-09-10 — End: 2016-09-10
  Administered 2016-09-10: 0 g via INTRAVENOUS
  Administered 2016-09-10: 12.5 g via INTRAVENOUS

## 2016-09-10 MED ORDER — MORPHINE 4 MG/ML INTRAVENOUS CARTRIDGE
CARTRIDGE | INTRAVENOUS | Status: AC
Start: 2016-09-10 — End: 2016-09-10
  Administered 2016-09-10: 2 mg via INTRAVENOUS
  Filled 2016-09-10: qty 1

## 2016-09-10 MED ORDER — VASOPRESSIN 100 UNITS IN NS 100ML (SHOCK) INFUSION - FOR ANES
INTRAVENOUS | Status: DC | PRN
Start: 2016-09-10 — End: 2016-09-10
  Administered 2016-09-10: .04 [IU]/min via INTRAVENOUS

## 2016-09-10 MED ORDER — HYDROMORPHONE 2 MG/ML INJECTION SYRINGE
0.2000 mg | INJECTION | INTRAMUSCULAR | Status: AC
Start: 2016-09-10 — End: 2016-09-10

## 2016-09-10 MED ORDER — CALCIUM GLUCONATE 100 MG/ML (10 %) INTRAVENOUS SOLUTION
INTRAVENOUS | Status: AC
Start: 2016-09-10 — End: 2016-09-10
  Filled 2016-09-10: qty 10

## 2016-09-10 MED ORDER — SUFENTANIL 5 MCG/ML INFUSION - FOR ANES
INTRAVENOUS | Status: DC | PRN
Start: 2016-09-10 — End: 2016-09-10
  Administered 2016-09-10: 0.15 ug/kg/h via INTRAVENOUS
  Administered 2016-09-10: 0.2 ug/kg/h via INTRAVENOUS
  Administered 2016-09-10: 0 ug/kg/h via INTRAVENOUS
  Administered 2016-09-10: 0.25 ug/kg/h via INTRAVENOUS

## 2016-09-10 MED ORDER — NOREPINEPHRINE 10 MCG/ML IV DILUTION
Freq: Once | INTRAVENOUS | Status: DC | PRN
Start: 2016-09-10 — End: 2016-09-10
  Administered 2016-09-10 (×2): 8 ug via INTRAVENOUS

## 2016-09-10 MED ORDER — NITROGLYCERIN 50 MG/250 ML (200 MCG/ML) IN 5 % DEXTROSE INTRAVENOUS
INTRAVENOUS | Status: AC
Start: 2016-09-10 — End: 2016-09-10
  Filled 2016-09-10: qty 250

## 2016-09-10 MED ORDER — VANCOMYCIN 1,000 MG INTRAVENOUS INJECTION
INTRAVENOUS | Status: AC
Start: 2016-09-10 — End: 2016-09-10
  Filled 2016-09-10: qty 40

## 2016-09-10 MED ORDER — NICARDIPINE 25 MG/10 ML INTRAVENOUS SOLUTION
5.0000 mg/h | INTRAVENOUS | Status: DC
Start: 2016-09-10 — End: 2016-09-11
  Administered 2016-09-10: 5 mg/h via INTRAVENOUS
  Administered 2016-09-10: 0 mg/h via INTRAVENOUS
  Filled 2016-09-10: qty 20

## 2016-09-10 MED ADMIN — midazolam 1 mg/mL injection solution: INTRAVENOUS | @ 04:00:00

## 2016-09-10 MED ADMIN — albumin, human 5 % intravenous solution: INTRAVENOUS | @ 01:00:00

## 2016-09-10 MED ADMIN — PRISMASATE (BICARBONATE-BASED) DIALYSATE MIXTURE: INTRAVENOUS | @ 18:00:00 | NDC 09991000629

## 2016-09-10 MED ADMIN — nystatin 100,000 unit/gram topical powder: INTRAVENOUS | @ 12:00:00 | NDC 00574200802

## 2016-09-10 MED ADMIN — niCARdipine 25 mg/10 mL intravenous solution: @ 07:00:00

## 2016-09-10 MED ADMIN — mupirocin 2 % topical ointment: TOPICAL | @ 11:00:00

## 2016-09-10 MED ADMIN — sennosides 8.6 mg-docusate sodium 50 mg tablet: ORAL | @ 09:00:00

## 2016-09-10 MED ADMIN — albumin, human 5 % intravenous solution: INTRAVENOUS | @ 04:00:00

## 2016-09-10 MED ADMIN — lactated Ringers intravenous solution: INTRAVENOUS | @ 19:00:00 | NDC 00338011704

## 2016-09-10 MED ADMIN — norepinephrine bitartrate 1 mg/mL intravenous solution: INTRAVENOUS | @ 19:00:00

## 2016-09-10 MED ADMIN — Medication: INTRAVENOUS | @ 14:00:00

## 2016-09-10 MED ADMIN — bupivacaine (PF) 0.25 % (2.5 mg/mL) injection solution: INTRAVENOUS | @ 23:00:00

## 2016-09-10 MED ADMIN — oxyCODONE 5 mg tablet: @ 23:00:00

## 2016-09-10 MED ADMIN — electrolyte-A intravenous solution: INTRAVENOUS | @ 11:00:00 | NDC 00338022104

## 2016-09-10 MED ADMIN — potassium chloride 20 mEq/L in 0.9 % sodium chloride intravenous: INTRAVENOUS | @ 04:00:00 | NDC 00338069104

## 2016-09-10 MED ADMIN — fentaNYL (PF) 50 mcg/mL injection solution: TOPICAL | @ 12:00:00 | NDC 00641602701

## 2016-09-10 MED ADMIN — lactated Ringers intravenous solution: INTRAVENOUS | @ 03:00:00 | NDC 00338011704

## 2016-09-10 MED ADMIN — lactated Ringers intravenous solution: INTRAVENOUS | @ 18:00:00 | NDC 00338011704

## 2016-09-10 MED ADMIN — lidocaine 4 %-menthoL 1 % topical patch: INTRAVENOUS | @ 18:00:00

## 2016-09-10 MED ADMIN — PRISMASATE (BICARBONATE-BASED) 4K DIALYSATE: INTRAVENOUS | @ 19:00:00 | NDC 09991000633

## 2016-09-10 MED ADMIN — sodium chloride 0.9 % intravenous solution: ORAL | @ 21:00:00 | NDC 00338004904

## 2016-09-10 SURGICAL SUPPLY — 25 items
BLANKET 3M BAIR HUG UNDERBODY 84X36IN FULL ACCESS FLUID (MISCELLANEOUS PT CARE ITEMS) ×3 IMPLANT
CART HEPCON 5L HEP DS RSPN CAR_T SYRG BLUNT TIP NEEDLE (TEST) ×3 IMPLANT
CART ISTAT HEMA CG8+ ANALYZER (REAGENT) ×3 IMPLANT
CONV USE 402176 - GOWN SURG LRG L3 NONREINFORCE HKLP CLSR SET IN SLEEVE STRL LF  DISP BLU SIRUS SMS 43IN (DGOW) ×4
CONV USE ITEM 337904 - PACK SURG CV MIN STRL DISP LF (PVSU) ×2 IMPLANT
DUP USE ITEM 161800 - RESERVOIR AUTOTRANSFUSION 40 U_M COLLECTION FILTER (PERFUSION/HEART SUPPLIES) ×3 IMPLANT
ELECTRODE PATIENT RTN 9FT VLAB C30- LB RM PHSV ACRL FOAM CORD NONIRRITATE NONSENSITIZE ADH STRP (CAUTERY SUPPLIES) ×2 IMPLANT
ELECTRODE PATIENT RTN 9FT VLAB_REM C30- LB PLHSV ACRL FOAM (CAUTERY SUPPLIES) ×2
GOWN SURG LRG AAMI L3 NONREINF_ORCE SET IN SLEEVE HKLP CLSR (DGOW) ×2
GOWN SURG LRG L3 NONREINFORCE HKLP CLSR SET IN SLEEVE STRL LF  DISP BLU SIRUS SMS 43IN (DGOW) ×4 IMPLANT
KIT PLS LAV PLSVC + FAN SPRAY STRL LF  DISP (IRR) IMPLANT
KIT PLS LAV PLSVC + FAN SPRAY_STRL LF DISP (IRR)
KIT RM TURNOVER CLEANOP CSTM INFCT CONTROL (KITS & TRAYS (DISPOSABLE)) ×2
KIT RM TURNOVER CLEANOP CSTM I_NFCT CONTROL (KITS & TRAYS (DISPOSABLE)) ×1
KIT RM TURNOVER CLEANOP CUSTOM INFCT CONTROL (KITS & TRAYS (DISPOSABLE)) ×2 IMPLANT
PACK MINOR CUSTOM CV (PVSU) ×2
PACK SURG CV MIN STRL DISP LF (PVSU) ×2
PACK SURG TBG STRL DISP 30IN SIL LF (Connecting Tubes/Misc) ×4 IMPLANT
SET AUTOTRANS WASH CHAMBER TUB_E AT1 CATS (IV TUBING & ACCESSORIES) ×3 IMPLANT
SET AUTOTRANSFUSION TUBING ATS_SUCTION LINE (Cautery Accessories) ×3 IMPLANT
SOL IRRG 0.9% NACL 1000ML PLASTIC PR BTL ISTNC N-PYRG STRL LF (SOLUTIONS) ×2 IMPLANT
SOL IV VIAFLEX PLAS-LYTE A PH 7.4 TY 1 1000ML LF (SOLUTIONS) IMPLANT
SOLUTION IRRG NS 2F7124 1000CC_12/CS (SOLUTIONS) ×1
SOLUTION IV ELECTROLYTES_1000ML 2B2544X CS/14 (SOLUTIONS)
TUBING SILICONE 30IN (Connecting Tubes/Misc) ×2

## 2016-09-10 SURGICAL SUPPLY — 25 items
BLANKET 3M BAIR HUG UNDERBODY 84X36IN FULL ACCESS FLUID (MISCELLANEOUS PT CARE ITEMS) ×3 IMPLANT
CART HEPCON 5L HEP DS RSPN CAR_T SYRG BLUNT TIP NEEDLE (TEST) ×3 IMPLANT
CART HEPCON SILVER 2-3.5MG/KG_BLUNT HEP 4 CHNL SYRG NEEDLE (TEST) IMPLANT
CART HMS + HEP ASY 2 CHNL 9 SYRG HI RNG ACT CLOT TIME ANALYZER DISP (TEST) IMPLANT
CART HMS + HR-ACT HEP ASY 2 CH_NL 9 SYRG BLUNT NEEDLE (TEST)
CART HMS + RD .9- MG/KG 4 CHNL 9 SYRG HEP ASY BLUNT NEEDLE ANALYZER DISP (TEST) IMPLANT
CART HMS + RD 0-.9MG/KG HEP AS_Y 4 CHNL 9 SYRG BLUNT NEEDLE (TEST)
CART ISTAT HEMA CG8+ ANALYZER (REAGENT) ×3 IMPLANT
CONV USE ITEM 337904 - PACK SURG CV MIN STRL DISP LF (PVSU) ×2 IMPLANT
DUP USE ITEM 161800 - RESERVOIR AUTOTRANSFUSION 40 U_M COLLECTION FILTER (PERFUSION/HEART SUPPLIES) ×3 IMPLANT
ELECTRODE PATIENT RTN 9FT VLAB C30- LB RM PHSV ACRL FOAM CORD NONIRRITATE NONSENSITIZE ADH STRP (CAUTERY SUPPLIES) ×2 IMPLANT
ELECTRODE PATIENT RTN 9FT VLAB_REM C30- LB PLHSV ACRL FOAM (CAUTERY SUPPLIES) ×1
KIT PLS LAV PLSVC + FAN SPRAY STRL LF  DISP (IRR) IMPLANT
KIT PLS LAV PLSVC + FAN SPRAY_STRL LF DISP (IRR)
KIT RM TURNOVER CLEANOP CSTM INFCT CONTROL (KITS & TRAYS (DISPOSABLE)) ×2
KIT RM TURNOVER CLEANOP CSTM I_NFCT CONTROL (KITS & TRAYS (DISPOSABLE)) ×1
KIT RM TURNOVER CLEANOP CUSTOM INFCT CONTROL (KITS & TRAYS (DISPOSABLE)) ×2 IMPLANT
PACK MINOR CUSTOM CV (PVSU) ×1
PACK SURG CV MIN STRL DISP LF (PVSU) ×2
SET AUTOTRANS WASH CHAMBER TUB_E AT1 CATS (IV TUBING & ACCESSORIES) ×3 IMPLANT
SET AUTOTRANSFUSION TUBING ATS_SUCTION LINE (Cautery Accessories) ×3 IMPLANT
SOL IRRG 0.9% NACL 1000ML PLASTIC PR BTL ISTNC N-PYRG STRL LF (SOLUTIONS) ×2 IMPLANT
SOLUTION IRRG NS 2F7124 1000CC_12/CS (SOLUTIONS) ×2
STAB SURG OCTPS EVOL TISS (INSTRUMENTS) ×1
STAB SURG OCTPS EVOL TISS (SURGICAL INSTRUMENTS) ×2 IMPLANT

## 2016-09-10 NOTE — Anesthesia OR-ICU Handoff (Signed)
Anesthesia ICU Transfer of Care  [name] [name] is a 51 y.o. ,male, Weight: 126.5 kg (278 lb 14.1 oz)   had Procedure(s) with comments:  STERNOTOMY MEDIASTINAL - CVICU-12  In-Hse Stby (Cpb, Trauma, Bleeding, Cs) Per Hour  Autologous Blood Conservation, Monitoring Per Hour  Autologous Blood Conservation Setup  Emergency Bleeding Heart  performed  09/10/16   Primary Service: [name]*    Past Medical History:   Diagnosis Date   . Asthma    . Coronary artery disease    . CPAP (continuous positive airway pressure) dependence    . Esophageal reflux    . Hypercholesterolemia    . Hypertension    . Knee injury    . Sleep apnea       Allergy History as of 09/10/16     PROMETHAZINE       Noted Status Severity Type Reaction    03/27/15 0657 Erie Noe, RN 01/25/13 Active High  Seizure    03/27/15 0657 Erie Noe, RN 01/25/13 Active   Seizure    01/25/13 1440 Ninetta Lights 01/25/13 Active                 I completed my ICU transfer of care/ Handoff to the ICU receiving personnel during which we discussed :  All key and critical aspects of case discussed                                          Additional Info:Patient transported to CVICU 12 intubated, in stable condition.  Report given to primary team. AQA.    Active infusions:  Vaso 0.04    Leonia Corona, MD  09/10/2016, 19:28                                 Last OR Temp: Temperature: 37.7 C (99.9 F)  ABG:  PH (ARTERIAL)   Date Value Ref Range Status   09/10/2016 7.39 7.35 - 7.45 Final     PCO2 (ARTERIAL)   Date Value Ref Range Status   09/10/2016 32.0 (L) 35.0 - 45.0 mm/Hg Final     PCO2 (PCO2P)   Date Value Ref Range Status   09/10/2016 38 35 - 45 mmHg Final     PCO2 (VENOUS)   Date Value Ref Range Status   04/22/2016 38.00 (L) 41.00 - 51.00 mm/Hg Final     PO2 (ARTERIAL)   Date Value Ref Range Status   09/10/2016 80.0 72.0 - 100.0 mm/Hg Final     PO2 (PO2P)   Date Value Ref Range Status   09/10/2016 97 72 - 100 mmHg Final     PO2  (VENOUS)   Date Value Ref Range Status   04/22/2016 28.0 (L) 35.0 - 50.0 mm/Hg Final     SODIUM   Date Value Ref Range Status   09/10/2016 137 136 - 145 mmol/L Final     POTASSIUM   Date Value Ref Range Status   09/10/2016 4.6 3.5 - 5.1 mmol/L Final   04/30/2013 4.4 3.6 - 5.1 mEq/L Final     POTASSIUM, POC   Date Value Ref Range Status   09/10/2016 3.8 3.5 - 5.0 mmol/L Final     WHOLE BLOOD POTASSIUM   Date Value Ref Range Status   09/10/2016 4.5 3.5 - 5.0 mmol/L Final  CHLORIDE   Date Value Ref Range Status   09/10/2016 108 96 - 111 mmol/L Final     CALCIUM   Date Value Ref Range Status   09/10/2016 8.3 (L) 8.5 - 10.2 mg/dL Final   04/30/2013 9.3 8.9 - 10.3 mg/dL Final     Calculated P Axis   Date Value Ref Range Status   09/07/2016 35 degrees Final     Calculated R Axis   Date Value Ref Range Status   09/06/2016 39 degrees Final     Calculated T Axis   Date Value Ref Range Status   09/07/2016 55 degrees Final     IONIZED CALCIUM   Date Value Ref Range Status   09/10/2016 1.14 1.10 - 1.30 mmol/L Final     IONIZED CALCIUM, POC   Date Value Ref Range Status   09/10/2016 1.10 (L) 1.30 - 1.46 mmol/L Final     LACTATE   Date Value Ref Range Status   09/10/2016 4.5 (H) 0.0 - 1.3 mmol/L Final     HEMOGLOBIN   Date Value Ref Range Status   09/10/2016 7.0 (L) 12.0 - 18.0 g/dL Final     OXYHEMOGLOBIN   Date Value Ref Range Status   09/10/2016 95.7 85.0 - 98.0 % Final     CARBOXYHEMOGLOBIN   Date Value Ref Range Status   09/10/2016 1.9 0.0 - 2.5 % Final     MET-HEMOGLOBIN   Date Value Ref Range Status   09/10/2016 1.3 0.0 - 3.5 % Final     BASE EXCESS   Date Value Ref Range Status   04/22/2016 0.3 -3.0 - 3.0 mmol/L Final     BASE EXCESS (BEP)   Date Value Ref Range Status   09/10/2016 -7.0 (L) -2.0 - 3.0 mmol/L Final     BASE DEFICIT   Date Value Ref Range Status   09/10/2016 5.0 (H) 0.0 - 3.0 mmol/L Final     BICARBONATE (ARTERIAL)   Date Value Ref Range Status   09/10/2016 21.0 18.0 - 26.0 mmol/L Final     HCO3 (HCO3P)    Date Value Ref Range Status   09/10/2016 19 (L) 22 - 26 mmol/L Final     BICARBONATE (VENOUS)   Date Value Ref Range Status   04/22/2016 24.0 22.0 - 26.0 mmol/L Final     %FIO2 (VENOUS)   Date Value Ref Range Status   04/22/2016 21.0 % Final     Airway:  EndoTracheal Tube Oral 8.0 24 cm Lip (Active)   Airway Secure Device 09/10/2016  1:14 PM   Position Change Yes 09/10/2016  1:14 PM   Change Reason Routine 09/10/2016  1:14 PM       EndoTracheal Tube (Active)       EndoTracheal Tube Double Lumen  Lip (Active)   Airway Secure Tape 09/10/2016 12:00 AM       EndoTracheal Tube Oral;Cuffed 8.0 Lip (Active)

## 2016-09-10 NOTE — Nurses Notes (Signed)
Pt reporting shortness of breath. Respiratory Therapist notified, who came to bedside to change vent settings. [facility] notified.

## 2016-09-10 NOTE — Nurses Notes (Signed)
ACNP Travaglino notified of patient's HTN. See doc flow. Will start nicardipine gtt at 5mg  as ordered. Will continue to monitor.

## 2016-09-10 NOTE — Consults (Signed)
[facility]  CARDIAC SURGERY ICU PROGRESS NOTE      [name] [name],[name] [name]  Date of Admission:  09/06/2016  Date of Birth:      Hospital Day:  LOS: 1 day   Post-op Day:  Day of Surgery, 1 S/P  Minimally invasive CABG X1 via left anterior mini thoracotomy, POD 0  Return to OR for evacuation of hematoma repair LIMA to LAD anastamosis  Date of Service:  09/10/2016    SUBJECTIVE: Returned to OR overnight for re-exploration.     OBJECTIVE:    Vital Signs:  Temp (24hrs) Max:37.7 C (99.9 F)      Temperature: 37.7 C (99.9 F) (09/10/16 1630)  BP (Non-Invasive): 133/72 (09/10/16 1930)  MAP (Non-Invasive): 90 mmHG (09/10/16 1930)  Heart Rate: (!) 118 (09/10/16 1930)  Respiratory Rate: 15 (09/10/16 1927)  Pain Score (Numeric, Faces): Other (09/10/16 1149)  SpO2-1: 92 % (09/10/16 1927)    Base (Admission) Weight:  Base Weight (ADM): 120.7 kg (266 lb 1.5 oz)  Weight:  Weight: 126.5 kg (278 lb 14.1 oz)    Hemodynamics (last 24 hrs):  CVP: 12 MM HG (09/10/16 1930)  ART-Line  MAP: 82 mmHg (09/10/16 1930)    Ventilator Settings:Conventional settings:  Mode: SIMV(PC)/PS  Set VT: 550 mL  Set Rate: 16 Breaths Per Minute  Set PEEP: 10 cmH2O  Pressure Support: 10 cmH2O  PC Set: 16 cmH2O  FiO2: 100 %    Blood Gas Results:  Recent Labs      09/10/16   0600  09/10/16   1121  09/10/16   1456   FI02  60  40  50   PH  7.32*  7.36  7.39   PCO2  43.0  34.0*  32.0*   PO2  97.0  66.0*  80.0   BICARBONATE  22.0  20.5  21.0   BASEDEFICIT  3.8*  5.6*  5.0*   PFRATIO  162  165  160       Weaning Parameters:         Current Inpatient Medications:    Current Facility-Administered Medications:  acetaminophen (TYLENOL) tablet 650 mg Oral Q4H PRN   albumin human (ALBUMINAR) 5% premix infusion 12.5 g Intravenous Q20 Min PRN   aspirin chewable tablet 81 mg 81 mg Oral Daily   atorvastatin (LIPITOR) tablet 80 mg Oral QPM   bisacodyl (DULCOLAX) rectal suppository 10 mg Rectal Daily PRN   budesonide (PULMICORT RESPULES) 0.5 mg/2 mL nebulizer  suspension 0.5 mg Nebulization 2x/day   ceFAZolin (ANCEF) 2 g in D5W 50 mL IVPB 2 g Intravenous Q8H   chlorhexidine gluconate (PERIDEX) 0.12% mouthwash 15 mL Topical 2x/day   dexmedetomidine (PRECEDEX) 4 mcg/mL in NS infusion 0.4 mcg/kg/hr Intravenous Continuous   docusate sodium (COLACE) capsule 100 mg Oral 2x/day   fentaNYL (SUBLIMAZE) 50 mcg/mL injection 50 mcg Intravenous Q2H PRN   fluticasone (FLONASE) 50 mcg per spray nasal spray 1 Spray Each Nostril Daily   heparin 5,000 unit/mL injection 5,000 Units Subcutaneous Q8HRS   ipratropium (ATROVENT) 0.02% nebulizer solution 0.5 mg Nebulization Q4H PRN   levalbuterol (XOPENEX) 1.25 mg/ 0.5 mL nebulizer solution 1.25 mg Nebulization Q4H PRN   LR premix infusion  Intravenous Continuous   magnesium hydroxide (MILK OF MAGNESIA) 400mg  per 5mL oral liquid 30 mL Oral Daily PRN   montelukast (SINGULAIR) 10 mg tablet 10 mg Oral QPM   mupirocin (BACTROBAN) 2% topical ointment  Apply Topically 2x/day   niCARdipine (CARDENE) 2.5 mg/mL injection ---[employer]  niCARdipine (CARDENE) 50 mg in NS 250 mL (tot vol) infusion 5 mg/hr Intravenous Continuous   NS bolus infusion 40 mL 40 mL Intravenous Once PRN   NS flush syringe 2 mL Intracatheter Q8HRS   And      NS flush syringe 2-6 mL Intracatheter Q1 MIN PRN   NS premix infusion  Intravenous Continuous   ondansetron (ZOFRAN) 2 mg/mL injection 4 mg Intravenous Q8H PRN   oxyCODONE (ROXICODONE) immediate release tablet 5 mg Oral Q4H PRN   oxyCODONE (ROXICODONE) immediate release tablet 10 mg Oral Q4H PRN   pantoprazole (PROTONIX) delayed release tablet 40 mg Oral Daily before Breakfast   sennosides-docusate sodium (SENOKOT-S) 8.6-50mg  per tablet 1 Tab Oral 2x/day   SSIP insulin lispro (HUMALOG) 100 units/mL injection 2-6 Units Subcutaneous 4x/day PRN       Appropriate Home Meds restarted:  Yes    I/O:  I/O last 24 hours to current time:    Intake/Output Summary (Last 24 hours) at 09/10/16 1943  Last data filed at 09/10/16 1920    Gross per 24 hour   Intake           7464.5 ml   Output             4233 ml   Net           3231.5 ml     I/O last 3 completed shifts:  In: 5149.5 [P.O.:120; I.V.:4394.5; Blood:635]  Out: 3688 [Urine:643; Blood:1200; Chest Tube:1845]    Output 24 Hours 8 Hours   Urine      Chest Tube #1     Chest Tube #2     Chest Tube #3     NG                 Nutrition/Diet:  ROOM SERVICE:  SEND AUTOMATIC HOUSE TRAY  MNT PROTOCOL FOR DIETITIAN  DIET CLEAR LIQUID Consistency/restriction: NO REDS; Restrict fluids to: 1800 ML    Hardware (Lines, foley, tubes):   Date Placed Necessity Reviewed  Date Discontinued    Chest Tube #1       Chest Tube #2       Chest Tube #3       Pacer Wires:  monitor       Foley (out day number two)                      Labs:  Reviewed:  I have reviewed all lab results.      Radiology:    Reviewed:  CXR:  Direct visualization of the image on 09/10/2016 on [place] PACS showed  left lung collapse. Minimal amount of aeration       Physical Exam:    Constitutional:  acutely ill  Respiratory:  decreased breath sounds left > right  Cardiovascular:  regular rate and rhythm     Sinus tachycardia, 130  Gastrointestinal:  Soft, non-tender  Neurologic:  Grossly normal    ASSESSMENT/PLAN:    1. Pt with continued increase in CT output around 100/hr with dark bloody drainage  2. Decreased urine OP  3. Hypotension. Will give Albumin and start Vasopressin to keep MAP >65  4. Increased lactic acidosis  5. Acute post op blood loss anemia. Will transfuse 2 units PRBC's  6. Will plan to return to OR today for re-exploration with planned sternotomy incision  7. Discussed with Dr. Su Hilt and Dr. Melida Quitter    Problem List:  Active Hospital Problems    Diagnosis   .  Hypokalemia   . Unstable angina (HCC)       PT/OT: Yes    Patient has decision making capacity:  no   DNR Status:  Full Code    DVT RISK FACTORS HAVE BEN ASSESSED AND PROPHYLAXIS ORDERED (SEE RUBYONLINE - REFERENCE TOOLS - MD, DVT PROPHY OR POCKET CARD):  NO     Disposition Planning: Home discharge      Joslyn DevonMichelle L Wolen, APRN-ACNP-BC    The patient is status post CABG on mechanical ventilation.  He has post-operative anemia and bleeding and has required return to the OR to control bleeding.    I have seen and examined the patient and discussed the patient with the CVICU team.  I agree with the note and plan.  The patient is critically ill with mechanical ventilation after surgery, post-operative anemia, hypotension requiring vasopressors, volume expansion and surgical re-exploration .  The patient is at risk of deterioration and/or death and I have been at the bedside to examine the patient and have reviewed vitals, allergies, pertinent history, current labs, tests and notes.  The time spent dedicated to critical care is exclusive of time dedicated for procedures.  Total Critical Care Time: 40 minutes.

## 2016-09-10 NOTE — Nurses Notes (Addendum)
Report received from night shift RN  Reviewed kardex, labs, meds, and plan of care  VS and assessment per flowsheet    Restraint Continuation    Patient assessed and found to have the following condition *Intubation*    The restraint was applied to facilitate medical/surgical treatment to ensure safety.    The patient will continue to be evaluated and assessments documented on the flowsheet to ensure that the patient is released from the restraint at the earliest possible time.\

## 2016-09-10 NOTE — Nurses Notes (Signed)
ACNP Evelena Leydenrav at bedside. Verbal orders given to RN to administer 250cc albumin.

## 2016-09-10 NOTE — OR Surgeon (Signed)
PATIENT NAME: [name] FinerLLEN, [place] A  HOSPITAL NUMBER:  [id number]  DATE OF SERVICE: 09/09/2016  DATE OF BIRTH:      OPERATIVE REPORT    PREOPERATIVE DIAGNOSES:  1. Coronary artery disease.  2. Unstable angina.  3. 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    POSTOPERATIVE DIAGNOSES:  1. Coronary artery disease.  2. Unstable angina.  3. 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    NAME OF PROCEDURE:  Minimally invasive robotically-assisted coronary artery bypass grafting x1 with left internal mammary to left anterior descending artery.    SURGEON:  Randa EvensLawrence Wei, MD.    FIRST ASSISTANT:  Maryjo RochesterKevin Oberg, PA-C.    ANESTHESIA:  General endotracheal intubation.    INDICATIONS FOR PROCEDURE:  The patient is a 51 year old male with a history of coronary artery disease and previous stenting of his left anterior descending artery, who now has restenosis and unstable angina.    DESCRIPTION OF PROCEDURE:  The patient underwent induction of general anesthesia without difficulty with a double-lumen endotracheal tube and was prepped and draped in usual sterile fashion.  A camera port incision was made in the fourth intercostal space and the camera introduced under direct vision using a ClearPort.  Once in place, port sites were created for the robotic arms and the robot docked.  The left internal mammary artery was harvested robotically as a skeletonized pedicle, clipping all major side branches with metallic clips.  The patient was heparinized and the mammary clipped distally on the chest wall and divided between clips.  Under robotic vision, the Medtronic Octopus Nuvo device was introduced into the thoracic cavity via a left subcostal site.  The robot was undocked and the camera port incision was enlarged medially just below the nipple.  An Alexis soft tissue retractor was placed and a small rib spreader placed.  The pericardial fat pad was excised.  Pericardium was opened directly over the left  anterior descending artery.  The artery was stabilized with the Octopus Nuvo and snared proximally with a silastic retractor tape.  The LAD was opened and the internal mammary artery anastomosed to it with a running 7-0 Prolene suture technique.  A 1.5 mm [place] probe easily passed distally through the anastomosis.  Flow was established through the mammary graft.  The snare was removed from the native vessel.  The graft flow was checked and was found to be greater than 30 mL/minute with a pulsatility index of 2.5.  The heparin was reversed with protamine.  Careful attention was paid to hemostasis.  A single Blake drain was inserted via the subcostal port site.  0.5% Marcaine was used to create multilevel intercostal blocks.  The pectoralis muscle was closed with 0 Vicryl suture.  Multiple layers of absorbable suture completed the soft tissue closure.  The patient tolerated the procedure well and returned to the CVICU in stable condition.      I was present and scrubbed for the entire procedure and served as Paramedicprimary surgeon.  Maryjo RochesterKevin Oberg, PA-C, served as first Geophysicist/field seismologistassistant for opening, Holiday representativeconstruction of the graft and closure.        Zipporah PlantsLawrence M Wei, MD  Associate Professor, Division of Cardiac Surgery   [facility] of Cardiovascular and Thoracic Surgery              DD:  09/09/2016 16:30:14  DT:  09/10/2016 04:00:43 NW  D#:  [id number]

## 2016-09-10 NOTE — Nurses Notes (Signed)
ACNP notified that CT output has remained 80-100/hr. Verbal orders to collect am labs now and add aPTT, PT/INR, fibrinogen, and ABG. Will collect and continue to monitor closely.

## 2016-09-10 NOTE — Nurses Notes (Signed)
09/10/16 0800 09/10/16 0900 09/10/16 1000   Chest Tube Left;Pleural   Placement Date: 09/09/16   Inserted: By Physician;In the OR  Chest Tube Type: Pleural  Drain/Tube Size: 24 FR  Orientation: Left;Pleural  Line/Tube Sutured?: Yes  Post Procedure X-Ray: (c) Yes   Chest Tube Level 110 mL 160 mL 290 mL   Chest Tube Output 40 50 130       09/10/16 1100   Chest Tube Left;Pleural   Placement Date: 09/09/16   Inserted: By Physician;In the OR  Chest Tube Type: Pleural  Drain/Tube Size: 24 FR  Orientation: Left;Pleural  Line/Tube Sutured?: Yes  Post Procedure X-Ray: (c) Yes   Chest Tube Level 350 mL   Chest Tube Output 60     ACNP Wolen aware of chest tube output.   Pt's heart rate remains in the 130's. 2 mg morphine given per order. 500 mL of 5% albumin given per order.   ACNP Wolen at bedside to assess patient.

## 2016-09-10 NOTE — Brief Op Note (Signed)
[facility]                                                     BRIEF OPERATIVE NOTE    Patient Name: Deirdre Evenerllen,Amdrew A  Hospital Number: [id number]  Date of Service: 09/10/2016   Date of Birth:     All elements must be documented.    Pre-Operative Diagnosis: postoperative bleeding  Post-Operative Diagnosis: postoperative bleeding  Procedure(s)/Description: re-exploration left anterior mini thoracotomy, evacuation of hematoma, repair LIMA to LAD anastamosis  Findings/Complexity (inherent to the procedure performed): bleeding from heel of LIMA to LAD    Attending Surgeon: Vicente SereneHarold Jahmarion Popoff MD  Assistant(s): Florence Cannerebecca Irwin PA-C    Anesthesia Type: General  Estimated Blood Loss:  Minimal  Blood Given: none  Fluids Given: per anesthesia record  Complications (not routinely expected or not inherent to difficulty/nature of procedure): none  Characteristic Event (routinely expected or inherent to the difficulty/nature of the procedure): none  Did the use of current and/or prior Anticoagulants impact the outcome of the case? no  Wound Class: Clean Wound: Uninfected operative wounds in which no inflammation occurred    Tubes: Chest Tube and Oral Gastric Tube  Drains: None  Specimens/ Cultures: none  Implants: none           Disposition: ICU - intubated and hemodynamically stable.  Condition: stable    Florence Cannerebecca Irwin, PA-C

## 2016-09-10 NOTE — Nurses Notes (Signed)
09/10/16 0800 09/10/16 0900 09/10/16 1000   Foley Catheter   Placement Date: 09/09/16   Inserted: By Nurse;In the OR  Foley Cath Type: Temperature Foley Cath  Foley Catheter Size: 16 FR  Balloon inflated to: 10 cc  Cathether Secured: Yes   Output 75 45 5       09/10/16 1100   Foley Catheter   Placement Date: 09/09/16   Inserted: By Nurse;In the OR  Foley Cath Type: Temperature Foley Cath  Foley Catheter Size: 16 FR  Balloon inflated to: 10 cc  Cathether Secured: Yes   Output 5     Dr. Melida QuitterMcCarthy notified of above output.   Nicardipine stopped, HR 130's  Fentanyl 25 mcg given per Dr. Melida QuitterMcCarthy

## 2016-09-10 NOTE — Nurses Notes (Signed)
Patient transported to OR via RN and anesthesia with platelets infusing per order, and on 3L NC with monitor and ambu bag.

## 2016-09-10 NOTE — Anesthesia OR-ICU Handoff (Signed)
Anesthesia ICU Transfer of Care  [name] [name] is a 51 y.o. ,male, Weight: 120.4 kg (265 lb 6.9 oz)   had Procedure(s):  Irrigation And Debridement Wound Chest  Autologous Blood Conservation Setup  Autologous Blood Conservation, Monitoring Per Hour  Perfusion Charge/Stby/Cardiopulmonary Bypass  performed  09/10/16   Primary Service: [name]*    Past Medical History:   Diagnosis Date   . Asthma    . Coronary artery disease    . CPAP (continuous positive airway pressure) dependence    . Esophageal reflux    . Hypercholesterolemia    . Hypertension    . Knee injury    . Sleep apnea       Allergy History as of 09/10/16     PROMETHAZINE       Noted Status Severity Type Reaction    03/27/15 0657 Erie Noe, RN 01/25/13 Active High  Seizure    03/27/15 0657 Erie Noe, RN 01/25/13 Active   Seizure    01/25/13 1440 Ninetta Lights 01/25/13 Active                 I completed my ICU transfer of care/ Handoff to the ICU receiving personnel during which we discussed :  All key and critical aspects of case discussed                                          Additional Info:Patient transported to CVICU 12 intubated, in stable condition.  Report given to primary staff.  AQA.    No active infusions.    Hilbert Odor Annaya Bangert, MD  09/10/2016, 05:47                                 Last OR Temp: Temperature: 36.8 C (98.2 F)  ABG:  PH (ARTERIAL)   Date Value Ref Range Status   09/10/2016 7.36 7.35 - 7.45 Final     PCO2 (ARTERIAL)   Date Value Ref Range Status   09/10/2016 37.0 35.0 - 45.0 mm/Hg Final     PCO2 (PCO2P)   Date Value Ref Range Status   09/10/2016 46 (H) 35 - 45 mmHg Final     PCO2 (VENOUS)   Date Value Ref Range Status   04/22/2016 38.00 (L) 41.00 - 51.00 mm/Hg Final     PO2 (ARTERIAL)   Date Value Ref Range Status   09/10/2016 84.0 72.0 - 100.0 mm/Hg Final     PO2 (PO2P)   Date Value Ref Range Status   09/10/2016 110 (H) 72 - 100 mmHg Final     PO2 (VENOUS)   Date Value Ref Range Status    04/22/2016 28.0 (L) 35.0 - 50.0 mm/Hg Final     SODIUM   Date Value Ref Range Status   09/10/2016 132 (L) 136 - 145 mmol/L Final     POTASSIUM   Date Value Ref Range Status   09/10/2016 4.9 3.5 - 5.1 mmol/L Final   04/30/2013 4.4 3.6 - 5.1 mEq/L Final     POTASSIUM, POC   Date Value Ref Range Status   09/10/2016 4.3 3.5 - 5.0 mmol/L Final     WHOLE BLOOD POTASSIUM   Date Value Ref Range Status   09/10/2016 4.9 3.5 - 5.0 mmol/L Final     CHLORIDE   Date Value Ref  Range Status   09/10/2016 104 96 - 111 mmol/L Final     CALCIUM   Date Value Ref Range Status   09/10/2016 8.4 (L) 8.5 - 10.2 mg/dL Final   04/30/2013 9.3 8.9 - 10.3 mg/dL Final     Calculated P Axis   Date Value Ref Range Status   09/07/2016 35 degrees Final     Calculated R Axis   Date Value Ref Range Status   09/06/2016 39 degrees Final     Calculated T Axis   Date Value Ref Range Status   09/07/2016 55 degrees Final     IONIZED CALCIUM   Date Value Ref Range Status   09/10/2016 1.14 1.10 - 1.30 mmol/L Final   09/10/2016 1.15 1.10 - 1.36 mmol/L Final     IONIZED CALCIUM, POC   Date Value Ref Range Status   09/10/2016 1.09 (L) 1.30 - 1.46 mmol/L Final     LACTATE   Date Value Ref Range Status   09/10/2016 1.8 (H) 0.0 - 1.3 mmol/L Final     HEMOGLOBIN   Date Value Ref Range Status   09/10/2016 14.1 12.0 - 18.0 g/dL Final     OXYHEMOGLOBIN   Date Value Ref Range Status   09/10/2016 94.5 85.0 - 98.0 % Final     CARBOXYHEMOGLOBIN   Date Value Ref Range Status   09/10/2016 2.6 (H) 0.0 - 2.5 % Final     MET-HEMOGLOBIN   Date Value Ref Range Status   09/10/2016 1.1 0.0 - 3.5 % Final     BASE EXCESS   Date Value Ref Range Status   04/22/2016 0.3 -3.0 - 3.0 mmol/L Final     BASE EXCESS (BEP)   Date Value Ref Range Status   09/10/2016 -4.0 (L) -2.0 - 3.0 mmol/L Final     BASE DEFICIT   Date Value Ref Range Status   09/10/2016 4.0 (H) 0.0 - 3.0 mmol/L Final     BICARBONATE (ARTERIAL)   Date Value Ref Range Status   09/10/2016 21.7 18.0 - 26.0 mmol/L Final     HCO3  (HCO3P)   Date Value Ref Range Status   09/10/2016 22 22 - 26 mmol/L Final     BICARBONATE (VENOUS)   Date Value Ref Range Status   04/22/2016 24.0 22.0 - 26.0 mmol/L Final     %FIO2 (VENOUS)   Date Value Ref Range Status   04/22/2016 21.0 % Final     Airway:  EndoTracheal Tube Oral 8.0 Lip (Active)   Airway Secure Tape 09/10/2016 12:00 AM       EndoTracheal Tube Double Lumen  Lip (Active)   Airway Secure Tape 09/10/2016 12:00 AM       EndoTracheal Tube Oral;Cuffed 8.0 Lip (Active)

## 2016-09-10 NOTE — Nurses Notes (Signed)
Results for [name], [name] [name] (MRN [id number]) as of 09/10/2016 16:15   Ref. Range 09/10/2016 11:21 09/10/2016 14:56   HGB Latest Ref Range: 12.5 - 16.3 g/dL 9.9 (L) 7.1 (L)     Order received for 2 units PRBC's

## 2016-09-10 NOTE — Nurses Notes (Signed)
Pt arrived from OR s/p washout. Pt placed on monitor and vent. ACNP Ondrejko notified of pt arrival.   Restraint Initiation    Patient assessed and found to have the following condition: emergent delirium.     The restraint was applied to facilitate medical/surgical treatment to ensure safety.    The patient will continue to be evaluated and assessments documented on the flowsheet to ensure that the patient is released from the restraint at the earliest possible time.

## 2016-09-10 NOTE — Nurses Notes (Addendum)
Pt arrived back to CVICU12 s/p washout OR. Hooked to monitor by nurse and vent by RT. Will continue to monitor.

## 2016-09-10 NOTE — Anesthesia Preprocedure Evaluation (Signed)
ANESTHESIA PRE-OP EVALUATION  Planned Procedure: IRRIGATION AND DEBRIDEMENT WOUND CHEST (N/A )  STERNOTOMY MEDIASTINAL (N/A )  Review of Systems         patient summary reviewed  nursing notes reviewed        Pulmonary   asthma and sleep apnea  Cardiovascular    Hypertension and CADNo peripheral edema       GI/Hepatic/Renal   GERD    Endo/Other         Neuro/Psych/MS    Cancer                                  Physical Assessment      Patient summary reviewed and Nursing notes reviewed   Airway                 Endotracheal tube present      Dental                    Pulmonary           Cardiovascular        (-) no peripheral edema     Other findings            Plan  Planned anesthesia type: GETA    ASA 4 - emergent     Intravenous induction   Central line and Arterial line                        Plan discussed with attending and resident.    (Due to emergent case, consent not obtained. )

## 2016-09-10 NOTE — Brief Op Note (Signed)
[facility]                                                     BRIEF OPERATIVE NOTE    Patient Name: [name] [name],[name] [name]  Hospital Number: [id number]  Date of Service: 09/10/2016   Date of Birth:     All elements must be documented.    Pre-Operative Diagnosis: Post operative bleeding s/p robotic assisted midCAB yesterday and bring back x 1 this morning.   Post-Operative Diagnosis:  Same  Procedure(s)/Description:  Sternotomy, evacuation of hematoma, clip to venous bleeder off the IMA  Findings/Complexity (inherent to the procedure performed): Large amount of clot in left chest.  Venous bleeding off LIMA    Attending Surgeon: Vicente SereneHarold [place] Dohmen, MD  Assistant(s): Mariann LasterKatherine Lestitian PA-C    Anesthesia Type: General  Estimated Blood Loss:  Minimal  Blood Given: 2 units PRBCs  Fluids Given: per anesthesia  Complications (not routinely expected or not inherent to difficulty/nature of procedure):None  Characteristic Event (routinely expected or inherent to the difficulty/nature of the procedure): Routine  Did the use of current and/or prior Anticoagulants impact the outcome of the case? no  Wound Class: Clean Wound: Uninfected operative wounds in which no inflammation occurred    Tubes: Chest Tube  Drains: None  Specimens/ Cultures: None  Implants: None           Disposition: ICU - intubated and hemodynamically stable.  Condition: stable    Howell PringleKatherine Anne OlowaluLestitian, [place] JerseyPA-C

## 2016-09-10 NOTE — Anesthesia Transfer of Care (Signed)
ANESTHESIA TRANSFER OF CARE   [name] [name] is a 51 y.o. ,male, Weight: 126.5 kg (278 lb 14.1 oz)   had Procedure(s) with comments:  STERNOTOMY MEDIASTINAL - CVICU-12  In-Hse Stby (Cpb, Trauma, Bleeding, Cs) Per Hour  Autologous Blood Conservation, Monitoring Per Hour  Autologous Blood Conservation Setup  Emergency Bleeding Heart  performed  09/10/16   Primary Service: [name]*    Past Medical History:   Diagnosis Date   . Asthma    . Coronary artery disease    . CPAP (continuous positive airway pressure) dependence    . Esophageal reflux    . Hypercholesterolemia    . Hypertension    . Knee injury    . Sleep apnea       Allergy History as of 09/10/16     PROMETHAZINE       Noted Status Severity Type Reaction    03/27/15 0657 Erie Noe, RN 01/25/13 Active High  Seizure    03/27/15 0657 Erie Noe, RN 01/25/13 Active   Seizure    01/25/13 1440 Ninetta Lights 01/25/13 Active                 I completed my transfer of care / handoff to the receiving personnel during which we discussed:  All key/critical aspects of case discussed                                              Additional Info:Patient transported to CVICU 12 intubated, in stable condition.  Report given to primary team. AQA.    Active infusions:  Vaso 0.04    Leonia Corona, MD  09/10/2016, 19:28                      Last OR Temp: Temperature: 37.7 C (99.9 F)  ABG:  PH (ARTERIAL)   Date Value Ref Range Status   09/10/2016 7.39 7.35 - 7.45 Final     PCO2 (ARTERIAL)   Date Value Ref Range Status   09/10/2016 32.0 (L) 35.0 - 45.0 mm/Hg Final     PCO2 (PCO2P)   Date Value Ref Range Status   09/10/2016 38 35 - 45 mmHg Final     PCO2 (VENOUS)   Date Value Ref Range Status   04/22/2016 38.00 (L) 41.00 - 51.00 mm/Hg Final     PO2 (ARTERIAL)   Date Value Ref Range Status   09/10/2016 80.0 72.0 - 100.0 mm/Hg Final     PO2 (PO2P)   Date Value Ref Range Status   09/10/2016 97 72 - 100 mmHg Final     PO2 (VENOUS)   Date Value Ref  Range Status   04/22/2016 28.0 (L) 35.0 - 50.0 mm/Hg Final     SODIUM   Date Value Ref Range Status   09/10/2016 137 136 - 145 mmol/L Final     POTASSIUM   Date Value Ref Range Status   09/10/2016 4.6 3.5 - 5.1 mmol/L Final   04/30/2013 4.4 3.6 - 5.1 mEq/L Final     POTASSIUM, POC   Date Value Ref Range Status   09/10/2016 3.8 3.5 - 5.0 mmol/L Final     WHOLE BLOOD POTASSIUM   Date Value Ref Range Status   09/10/2016 4.5 3.5 - 5.0 mmol/L Final     CHLORIDE   Date Value Ref Range Status  09/10/2016 108 96 - 111 mmol/L Final     CALCIUM   Date Value Ref Range Status   09/10/2016 8.3 (L) 8.5 - 10.2 mg/dL Final   04/30/2013 9.3 8.9 - 10.3 mg/dL Final     Calculated P Axis   Date Value Ref Range Status   09/07/2016 35 degrees Final     Calculated R Axis   Date Value Ref Range Status   09/06/2016 39 degrees Final     Calculated T Axis   Date Value Ref Range Status   09/07/2016 55 degrees Final     IONIZED CALCIUM   Date Value Ref Range Status   09/10/2016 1.14 1.10 - 1.30 mmol/L Final     IONIZED CALCIUM, POC   Date Value Ref Range Status   09/10/2016 1.10 (L) 1.30 - 1.46 mmol/L Final     LACTATE   Date Value Ref Range Status   09/10/2016 4.5 (H) 0.0 - 1.3 mmol/L Final     HEMOGLOBIN   Date Value Ref Range Status   09/10/2016 7.0 (L) 12.0 - 18.0 g/dL Final     OXYHEMOGLOBIN   Date Value Ref Range Status   09/10/2016 95.7 85.0 - 98.0 % Final     CARBOXYHEMOGLOBIN   Date Value Ref Range Status   09/10/2016 1.9 0.0 - 2.5 % Final     MET-HEMOGLOBIN   Date Value Ref Range Status   09/10/2016 1.3 0.0 - 3.5 % Final     BASE EXCESS   Date Value Ref Range Status   04/22/2016 0.3 -3.0 - 3.0 mmol/L Final     BASE EXCESS (BEP)   Date Value Ref Range Status   09/10/2016 -7.0 (L) -2.0 - 3.0 mmol/L Final     BASE DEFICIT   Date Value Ref Range Status   09/10/2016 5.0 (H) 0.0 - 3.0 mmol/L Final     BICARBONATE (ARTERIAL)   Date Value Ref Range Status   09/10/2016 21.0 18.0 - 26.0 mmol/L Final     HCO3 (HCO3P)   Date Value Ref Range  Status   09/10/2016 19 (L) 22 - 26 mmol/L Final     BICARBONATE (VENOUS)   Date Value Ref Range Status   04/22/2016 24.0 22.0 - 26.0 mmol/L Final     %FIO2 (VENOUS)   Date Value Ref Range Status   04/22/2016 21.0 % Final     Airway:  EndoTracheal Tube Oral 8.0 24 cm Lip (Active)   Airway Secure Device 09/10/2016  1:14 PM   Position Change Yes 09/10/2016  1:14 PM   Change Reason Routine 09/10/2016  1:14 PM       EndoTracheal Tube (Active)       EndoTracheal Tube Double Lumen  Lip (Active)   Airway Secure Tape 09/10/2016 12:00 AM       EndoTracheal Tube Oral;Cuffed 8.0 Lip (Active)     Blood pressure (!) 86/55, pulse (!) 113, temperature 37.7 C (99.9 F), resp. rate 15, height 1.905 m ('6\' 3"'$ ), weight 126.5 kg (278 lb 14.1 oz), SpO2 92 %.

## 2016-09-10 NOTE — Anesthesia Transfer of Care (Signed)
ANESTHESIA TRANSFER OF CARE   [name] [name] is a 51 y.o. ,male, Weight: 120.4 kg (265 lb 6.9 oz)   had Procedure(s):  Irrigation And Debridement Wound Chest  Autologous Blood Conservation Setup  Autologous Blood Conservation, Monitoring Per Hour  Perfusion Charge/Stby/Cardiopulmonary Bypass  performed  09/10/16   Primary Service: [name]*    Past Medical History:   Diagnosis Date   . Asthma    . Coronary artery disease    . CPAP (continuous positive airway pressure) dependence    . Esophageal reflux    . Hypercholesterolemia    . Hypertension    . Knee injury    . Sleep apnea       Allergy History as of 09/10/16     PROMETHAZINE       Noted Status Severity Type Reaction    03/27/15 0657 Erie Noe, RN 01/25/13 Active High  Seizure    03/27/15 0657 Erie Noe, RN 01/25/13 Active   Seizure    01/25/13 1440 Ninetta Lights 01/25/13 Active                 I completed my transfer of care / handoff to the receiving personnel during which we discussed:  All key/critical aspects of case discussed                                              Additional Info:Patient transported to CVICU 12 intubated, in stable condition.  Report given to primary staff.  AQA.    No active infusions.    Hilbert Odor Deskins, MD  09/10/2016, 05:47                      Last OR Temp: Temperature: 36.8 C (98.2 F)  ABG:  PH (ARTERIAL)   Date Value Ref Range Status   09/10/2016 7.36 7.35 - 7.45 Final     PCO2 (ARTERIAL)   Date Value Ref Range Status   09/10/2016 37.0 35.0 - 45.0 mm/Hg Final     PCO2 (PCO2P)   Date Value Ref Range Status   09/10/2016 46 (H) 35 - 45 mmHg Final     PCO2 (VENOUS)   Date Value Ref Range Status   04/22/2016 38.00 (L) 41.00 - 51.00 mm/Hg Final     PO2 (ARTERIAL)   Date Value Ref Range Status   09/10/2016 84.0 72.0 - 100.0 mm/Hg Final     PO2 (PO2P)   Date Value Ref Range Status   09/10/2016 110 (H) 72 - 100 mmHg Final     PO2 (VENOUS)   Date Value Ref Range Status   04/22/2016 28.0 (L) 35.0 -  50.0 mm/Hg Final     SODIUM   Date Value Ref Range Status   09/10/2016 132 (L) 136 - 145 mmol/L Final     POTASSIUM   Date Value Ref Range Status   09/10/2016 4.9 3.5 - 5.1 mmol/L Final   04/30/2013 4.4 3.6 - 5.1 mEq/L Final     POTASSIUM, POC   Date Value Ref Range Status   09/10/2016 4.3 3.5 - 5.0 mmol/L Final     WHOLE BLOOD POTASSIUM   Date Value Ref Range Status   09/10/2016 4.9 3.5 - 5.0 mmol/L Final     CHLORIDE   Date Value Ref Range Status   09/10/2016 104 96 - 111 mmol/L Final  CALCIUM   Date Value Ref Range Status   09/10/2016 8.4 (L) 8.5 - 10.2 mg/dL Final   04/30/2013 9.3 8.9 - 10.3 mg/dL Final     Calculated P Axis   Date Value Ref Range Status   09/07/2016 35 degrees Final     Calculated R Axis   Date Value Ref Range Status   09/06/2016 39 degrees Final     Calculated T Axis   Date Value Ref Range Status   09/07/2016 55 degrees Final     IONIZED CALCIUM   Date Value Ref Range Status   09/10/2016 1.14 1.10 - 1.30 mmol/L Final   09/10/2016 1.15 1.10 - 1.36 mmol/L Final     IONIZED CALCIUM, POC   Date Value Ref Range Status   09/10/2016 1.09 (L) 1.30 - 1.46 mmol/L Final     LACTATE   Date Value Ref Range Status   09/10/2016 1.8 (H) 0.0 - 1.3 mmol/L Final     HEMOGLOBIN   Date Value Ref Range Status   09/10/2016 14.1 12.0 - 18.0 g/dL Final     OXYHEMOGLOBIN   Date Value Ref Range Status   09/10/2016 94.5 85.0 - 98.0 % Final     CARBOXYHEMOGLOBIN   Date Value Ref Range Status   09/10/2016 2.6 (H) 0.0 - 2.5 % Final     MET-HEMOGLOBIN   Date Value Ref Range Status   09/10/2016 1.1 0.0 - 3.5 % Final     BASE EXCESS   Date Value Ref Range Status   04/22/2016 0.3 -3.0 - 3.0 mmol/L Final     BASE EXCESS (BEP)   Date Value Ref Range Status   09/10/2016 -4.0 (L) -2.0 - 3.0 mmol/L Final     BASE DEFICIT   Date Value Ref Range Status   09/10/2016 4.0 (H) 0.0 - 3.0 mmol/L Final     BICARBONATE (ARTERIAL)   Date Value Ref Range Status   09/10/2016 21.7 18.0 - 26.0 mmol/L Final     HCO3 (HCO3P)   Date Value Ref  Range Status   09/10/2016 22 22 - 26 mmol/L Final     BICARBONATE (VENOUS)   Date Value Ref Range Status   04/22/2016 24.0 22.0 - 26.0 mmol/L Final     %FIO2 (VENOUS)   Date Value Ref Range Status   04/22/2016 21.0 % Final     Airway:  EndoTracheal Tube Oral 8.0 Lip (Active)   Airway Secure Tape 09/10/2016 12:00 AM       EndoTracheal Tube Double Lumen  Lip (Active)   Airway Secure Tape 09/10/2016 12:00 AM       EndoTracheal Tube Oral;Cuffed 8.0 Lip (Active)     Blood pressure 99/70, pulse (!) 103, temperature 36.8 C (98.2 F), resp. rate 13, height 1.905 m (6' 3"), weight 120.4 kg (265 lb 6.9 oz), SpO2 100 %.

## 2016-09-10 NOTE — Nurses Notes (Signed)
09/10/16 1200 09/10/16 1300   Foley Catheter   Placement Date: 09/09/16   Inserted: By Nurse;In the OR  Foley Cath Type: Temperature Foley Cath  Foley Catheter Size: 16 FR  Balloon inflated to: 10 cc  Cathether Secured: Yes   Output 0 7   Chest Tube Left;Pleural   Placement Date: 09/09/16   Inserted: By Physician;In the OR  Chest Tube Type: Pleural  Drain/Tube Size: 24 FR  Orientation: Left;Pleural  Line/Tube Sutured?: Yes  Post Procedure X-Ray: (c) Yes   Chest Tube Level 410 mL 580 mL   Chest Tube Output 60 170   ACNP Wolen aware of above output

## 2016-09-10 NOTE — Nurses Notes (Signed)
Patient admitted from OR, hooked to monitor by RN, assessment to follow

## 2016-09-10 NOTE — Nurses Notes (Signed)
09/10/16 1500 09/10/16 1600   Foley Catheter   Placement Date: 09/09/16   Inserted: By Nurse;In the OR  Foley Cath Type: Temperature Foley Cath  Foley Catheter Size: 16 FR  Balloon inflated to: 10 cc  Cathether Secured: Yes   Output 0 15   Chest Tube Left;Pleural   Placement Date: 09/09/16   Inserted: By Physician;In the OR  Chest Tube Type: Pleural  Drain/Tube Size: 24 FR  Orientation: Left;Pleural  Line/Tube Sutured?: Yes  Post Procedure X-Ray: (c) Yes   Chest Tube Level 810 mL 970 mL   Chest Tube Output 120 160     ACNP Wolen aware of output

## 2016-09-10 NOTE — Nurses Notes (Signed)
Patient complaining of CP with HR increasing to 140s and diaphoresis. Pt also having whole body spasm. ACNP Travaglino notified, and coming to bedside.    0205: Verbal orders received by ACNP at bedside to administer 250cc albumin and 0.2mg  dilaudid. Stat x-ray also ordered. Will continue to monitor closely.

## 2016-09-10 NOTE — Care Plan (Signed)
Problem: Patient Care Overview (Adult,OB)  Goal: Plan of Care Review(Adult,OB)  The patient and/or their representative will communicate an understanding of their plan of care   Outcome: Ongoing (see interventions/notes)  [name] started the shift on a nasal cannula at 3 lpm.  He was extremely nauseated, therefore, refusing to perform the PEP valve, his Pulmicort nebulizer treatment, and being placed on Qhs CPAP.  ABG at 0023 = 7.36/37/84/21.7/-4.  In the early morning, his chest tube began to produce more than usual drainage.  CXR showed whiteout on the left side.  The cardiac surgery team took [name] to the OR.  He came back intubated and was placed on mechanical ventilation with the following settings:  SIMV/VC 15/550/60%/+10/PS 12.  CXR showed the ETT in good position.  BBS = diminished.  Will continue to follow.

## 2016-09-11 ENCOUNTER — Inpatient Hospital Stay (HOSPITAL_COMMUNITY): Payer: BC Managed Care – PPO

## 2016-09-11 DIAGNOSIS — J984 Other disorders of lung: Secondary | ICD-10-CM

## 2016-09-11 DIAGNOSIS — R0902 Hypoxemia: Secondary | ICD-10-CM

## 2016-09-11 DIAGNOSIS — N179 Acute kidney failure, unspecified: Secondary | ICD-10-CM

## 2016-09-11 LAB — IONIZED CALCIUM WITH PH
IONIZED CALCIUM: 1.21 mmol/L (ref 1.10–1.36)
PH (VENOUS): 7.37 (ref 7.31–7.41)

## 2016-09-11 LAB — CBC WITH DIFF
HCT: 21.7 % — ABNORMAL LOW (ref 36.7–47.0)
HGB: 7.4 g/dL — ABNORMAL LOW (ref 12.5–16.3)
MCH: 29.7 pg (ref 27.4–33.0)
MCHC: 34.2 g/dL (ref 32.5–35.8)
MCV: 86.8 fL (ref 78.0–100.0)
MPV: 8.6 fL (ref 7.5–11.5)
PLATELETS: 148 x10ˆ3/uL (ref 140–450)
RBC: 2.5 x10ˆ6/uL — ABNORMAL LOW (ref 4.06–5.63)
RDW: 13.7 % (ref 12.0–15.0)
WBC: 15.7 x10?3/uL — ABNORMAL HIGH (ref 3.5–11.0)

## 2016-09-11 LAB — CROSSMATCH RED CELLS - UNITS
UNIT DIVISION: 0
UNIT DIVISION: 0
UNIT DIVISION: 0
UNIT DIVISION: 0
UNIT DIVISION: 0
UNIT DIVISION: 0

## 2016-09-11 LAB — PRODUCT: PLATELETS - UNITS
UNIT DIVISION: 0
UNIT DIVISION: 0

## 2016-09-11 LAB — BASIC METABOLIC PANEL
ANION GAP: 7 mmol/L (ref 4–13)
BUN/CREA RATIO: 21 (ref 6–22)
BUN: 31 mg/dL — ABNORMAL HIGH (ref 8–25)
CALCIUM: 8.6 mg/dL (ref 8.5–10.2)
CHLORIDE: 109 mmol/L (ref 96–111)
CO2 TOTAL: 23 mmol/L (ref 22–32)
CREATININE: 1.5 mg/dL — ABNORMAL HIGH (ref 0.62–1.27)
ESTIMATED GFR: 53 mL/min/1.73mˆ2 — ABNORMAL LOW (ref >59)
GLUCOSE: 148 mg/dL — ABNORMAL HIGH (ref 65–139)
POTASSIUM: 4.5 mmol/L (ref 3.5–5.1)
SODIUM: 139 mmol/L (ref 136–145)

## 2016-09-11 LAB — ARTERIAL BLOOD GAS/LACTATE/CO-OX/LYTES (NA/K/CA/CL/GLUC) - ORS ONLY
BASE DEFICIT: 0.7 mmol/L (ref 0.0–3.0)
BASE DEFICIT: 2.1 mmol/L (ref 0.0–3.0)
BICARBONATE (ARTERIAL): 23.3 mmol/L (ref 18.0–26.0)
BICARBONATE (ARTERIAL): 24.4 mmol/L (ref 18.0–26.0)
CARBOXYHEMOGLOBIN: 1.5 % (ref 0.0–2.5)
CARBOXYHEMOGLOBIN: 2.6 % — ABNORMAL HIGH (ref 0.0–2.5)
CHLORIDE: 107 mmol/L (ref 96–111)
CHLORIDE: 109 mmol/L (ref 96–111)
GLUCOSE: 142 mg/dL — ABNORMAL HIGH (ref 60–105)
GLUCOSE: 167 mg/dL — ABNORMAL HIGH (ref 60–105)
HEMOGLOBIN: 7.4 g/dL — ABNORMAL LOW (ref 12.0–18.0)
HEMOGLOBIN: 8.9 g/dL — ABNORMAL LOW (ref 12.0–18.0)
IONIZED CALCIUM: 1.18 mmol/L (ref 1.10–1.30)
IONIZED CALCIUM: 1.21 mmol/L (ref 1.10–1.30)
LACTATE: 1.4 mmol/L — ABNORMAL HIGH (ref 0.0–1.3)
LACTATE: 2.4 mmol/L — ABNORMAL HIGH (ref 0.0–1.3)
MET-HEMOGLOBIN: 1.4 % (ref 0.0–3.5)
MET-HEMOGLOBIN: 1.7 % (ref 0.0–3.5)
O2CT: 10 % — ABNORMAL LOW (ref 15.7–24.3)
O2CT: 11.9 % — ABNORMAL LOW (ref 15.7–24.3)
OXYHEMOGLOBIN: 94.4 % (ref 85.0–98.0)
OXYHEMOGLOBIN: 95.5 % (ref 85.0–98.0)
PCO2 (ARTERIAL): 40 mmHg (ref 35.0–45.0)
PCO2 (ARTERIAL): 41 mmHg (ref 35.0–45.0)
PH (ARTERIAL): 7.36 (ref 7.35–7.45)
PH (ARTERIAL): 7.39 (ref 7.35–7.45)
PO2 (ARTERIAL): 65 mmHg — ABNORMAL LOW (ref 72.0–100.0)
PO2 (ARTERIAL): 72 mmHg (ref 72.0–100.0)
SODIUM: 137 mmol/L (ref 136–145)
SODIUM: 138 mmol/L (ref 136–145)
WHOLE BLOOD POTASSIUM: 4.5 mmol/L (ref 3.5–5.0)
WHOLE BLOOD POTASSIUM: 4.6 mmol/L (ref 3.5–5.0)

## 2016-09-11 LAB — MAGNESIUM: MAGNESIUM: 2.2 mg/dL (ref 1.6–2.5)

## 2016-09-11 LAB — PRODUCT: FFP/PLASMA - UNITS
UNIT DIVISION: 0
UNIT DIVISION: 0

## 2016-09-11 LAB — PRODUCT: CRYOPRECIPITATE - UNITS
UNIT DIVISION: 0
UNIT DIVISION: 0

## 2016-09-11 LAB — PT/INR
INR: 1.38 — ABNORMAL HIGH (ref 0.80–1.20)
PROTHROMBIN TIME: 16 s — ABNORMAL HIGH (ref 9.3–13.9)

## 2016-09-11 LAB — POC BLOOD GLUCOSE (RESULTS)
GLUCOSE, POC: 164 mg/dL — ABNORMAL HIGH (ref 70–105)
GLUCOSE, POC: 149 mg/dL — ABNORMAL HIGH (ref 70–105)
GLUCOSE, POC: 117 mg/dL — ABNORMAL HIGH (ref 70–105)

## 2016-09-11 LAB — ARTERIAL BLOOD GAS/LACTATE/LYTES (NA/K/CA/CL/GLUC) - ORS ONLY
%FIO2 (ARTERIAL): 60 %
BASE EXCESS (ARTERIAL): 0 mmol/L (ref 0.0–1.0)
BICARBONATE (ARTERIAL): 24.9 mmol/L (ref 18.0–26.0)
CHLORIDE: 109 mmol/L (ref 96–111)
GLUCOSE: 141 mg/dL — ABNORMAL HIGH (ref 60–105)
IONIZED CALCIUM: 1.15 mmol/L (ref 1.10–1.30)
LACTATE: 1.1 mmol/L (ref 0.0–1.3)
PCO2 (ARTERIAL): 40 mmHg (ref 35.0–45.0)
PH (ARTERIAL): 7.4 (ref 7.35–7.45)
PO2 (ARTERIAL): 79 mmHg (ref 72.0–100.0)
SODIUM: 137 mmol/L (ref 136–145)
WHOLE BLOOD POTASSIUM: 4.6 mmol/L (ref 3.5–5.0)

## 2016-09-11 LAB — MANUAL DIFFERENTIAL (CELLAVISION)
BASOPHIL #: 0.14 x10ˆ3/uL (ref 0.00–0.20)
BASOPHIL %: 1 %
EOSINOPHIL #: 0 x10ˆ3/uL (ref 0.00–0.50)
EOSINOPHIL %: 0 %
LYMPHOCYTE #: 1.34 x10ˆ3/uL (ref 1.00–4.80)
LYMPHOCYTE %: 9 %
MONOCYTE #: 1.93 x10ˆ3/uL — ABNORMAL HIGH (ref 0.30–1.00)
MONOCYTE %: 12 %
NEUTROPHIL #: 12.29 x10ˆ3/uL — ABNORMAL HIGH (ref 1.50–7.70)
NEUTROPHIL %: 78 %

## 2016-09-11 LAB — TYPE AND SCREEN
ABO/RH(D): O POS
ANTIBODY SCREEN: NEGATIVE
UNITS ORDERED: 4

## 2016-09-11 LAB — ARTERIAL BLOOD GAS/LACTATE/LYTES (NA/K/CA/CL/GLUC): PAO2/FIO2 RATIO: 132 (ref <=200)

## 2016-09-11 LAB — ARTERIAL BLOOD GAS/LACTATE/CO-OX/LYTES (NA/K/CA/CL/GLUC)
%FIO2 (ARTERIAL): 50 %
%FIO2 (ARTERIAL): 50 %
PAO2/FIO2 RATIO: 130 (ref <=200)
PAO2/FIO2 RATIO: 144 (ref <=200)

## 2016-09-11 LAB — FIBRINOGEN: FIBRINOGEN: 378 mg/dL (ref 200–400)

## 2016-09-11 LAB — CAROTID ARTERY DUPLEX: Right CCA prox sys: 120 cm/s

## 2016-09-11 LAB — PTT (PARTIAL THROMBOPLASTIN TIME): APTT: 27.2 s (ref 25.1–36.5)

## 2016-09-11 MED ORDER — MORPHINE 4 MG/ML INTRAVENOUS CARTRIDGE
2.0000 mg | CARTRIDGE | Freq: Once | INTRAVENOUS | Status: AC
Start: 2016-09-11 — End: 2016-09-11
  Administered 2016-09-11: 2 mg via INTRAVENOUS

## 2016-09-11 MED ORDER — LIDOCAINE 5 % TOPICAL PATCH
1.0000 | MEDICATED_PATCH | Freq: Every day | CUTANEOUS | Status: DC
Start: 2016-09-12 — End: 2016-09-11

## 2016-09-11 MED ORDER — METOPROLOL TARTRATE 25 MG TABLET
25.00 mg | ORAL_TABLET | Freq: Two times a day (BID) | ORAL | Status: DC
Start: 2016-09-11 — End: 2016-09-12
  Administered 2016-09-11: 25 mg via ORAL
  Filled 2016-09-11 (×3): qty 1

## 2016-09-11 MED ORDER — METOPROLOL TARTRATE 12.5 MG HALF TAB
12.50 mg | ORAL_TABLET | Freq: Two times a day (BID) | ORAL | Status: DC
Start: 2016-09-11 — End: 2016-09-11
  Administered 2016-09-11 (×2): 12.5 mg via ORAL
  Filled 2016-09-11 (×2): qty 1

## 2016-09-11 MED ORDER — OXYCODONE-ACETAMINOPHEN 5 MG-325 MG TABLET
1.0000 | ORAL_TABLET | ORAL | Status: DC | PRN
Start: 2016-09-11 — End: 2016-09-11

## 2016-09-11 MED ORDER — LIDOCAINE 5 % TOPICAL PATCH
1.0000 | MEDICATED_PATCH | Freq: Every day | CUTANEOUS | Status: AC
Start: 2016-09-11 — End: 2016-09-13
  Administered 2016-09-12 – 2016-09-13 (×4): 700 mg via TRANSDERMAL
  Filled 2016-09-11 (×2): qty 1

## 2016-09-11 MED ORDER — MORPHINE 4 MG/ML INTRAVENOUS CARTRIDGE
2.0000 mg | CARTRIDGE | Freq: Once | INTRAVENOUS | Status: AC
Start: 2016-09-11 — End: 2016-09-11
  Administered 2016-09-11: 2 mg via INTRAVENOUS
  Filled 2016-09-11: qty 1

## 2016-09-11 MED ORDER — FUROSEMIDE 10 MG/ML INJECTION SOLUTION
40.0000 mg | Freq: Every day | INTRAMUSCULAR | Status: DC
Start: 2016-09-12 — End: 2016-09-11

## 2016-09-11 MED ORDER — ALBUMIN, HUMAN 5 % INTRAVENOUS SOLUTION
12.5000 g | INTRAVENOUS | Status: AC
Start: 2016-09-11 — End: 2016-09-11
  Administered 2016-09-11: 0 g via INTRAVENOUS
  Administered 2016-09-11: 12.5 g via INTRAVENOUS

## 2016-09-11 MED ORDER — ASPIRIN 81 MG CHEWABLE TABLET
81.0000 mg | CHEWABLE_TABLET | Freq: Every day | ORAL | Status: DC
Start: 2016-09-11 — End: 2016-09-11

## 2016-09-11 MED ORDER — SODIUM CHLORIDE 0.9 % IV BOLUS
40.0000 mL | INJECTION | Freq: Once | Status: AC | PRN
Start: 2016-09-11 — End: 2016-09-11

## 2016-09-11 MED ORDER — MORPHINE 4 MG/ML INTRAVENOUS CARTRIDGE
CARTRIDGE | INTRAVENOUS | Status: AC
Start: 2016-09-11 — End: 2016-09-11
  Administered 2016-09-11: 2 mg via INTRAVENOUS
  Filled 2016-09-11: qty 1

## 2016-09-11 MED ORDER — OXYCODONE-ACETAMINOPHEN 5 MG-325 MG TABLET
2.0000 | ORAL_TABLET | ORAL | Status: DC | PRN
Start: 2016-09-11 — End: 2016-09-11

## 2016-09-11 MED ORDER — METOPROLOL TARTRATE 5 MG/5 ML INTRAVENOUS SOLUTION
5.0000 mg | INTRAVENOUS | Status: AC
Start: 2016-09-11 — End: 2016-09-11
  Administered 2016-09-11: 5 mg via INTRAVENOUS
  Filled 2016-09-11: qty 5

## 2016-09-11 MED ORDER — METOPROLOL TARTRATE 5 MG/5 ML INTRAVENOUS SOLUTION
5.0000 mg | Freq: Once | INTRAVENOUS | Status: AC
Start: 2016-09-11 — End: 2016-09-11
  Administered 2016-09-11: 5 mg via INTRAVENOUS
  Filled 2016-09-11: qty 5

## 2016-09-11 MED ORDER — FUROSEMIDE 10 MG/ML INJECTION SOLUTION
40.0000 mg | Freq: Once | INTRAMUSCULAR | Status: AC
Start: 2016-09-11 — End: 2016-09-11
  Administered 2016-09-11: 40 mg via INTRAVENOUS
  Filled 2016-09-11: qty 4

## 2016-09-11 MED ORDER — FENTANYL (PF) 50 MCG/ML INJECTION SOLUTION
50.0000 ug | INTRAMUSCULAR | Status: AC
Start: 2016-09-11 — End: 2016-09-11
  Administered 2016-09-11: 50 ug via INTRAVENOUS

## 2016-09-11 MED ADMIN — Medication: INTRAVENOUS | @ 05:00:00

## 2016-09-11 MED ADMIN — levalbuteroL concentrate 1.25 mg/0.5 mL solution for nebulization: RESPIRATORY_TRACT | @ 23:00:00

## 2016-09-11 MED ADMIN — LEUCOVORIN IVPB - 350MG VIAL PREP: ORAL | @ 21:00:00

## 2016-09-11 MED ADMIN — lidocaine 5 % topical ointment: ORAL | @ 21:00:00 | NDC 00168020437

## 2016-09-11 MED ADMIN — PENICILLIN CADD PUMP INTERMITTENT INFUSION - PHARMACY ORDER: @ 21:00:00

## 2016-09-11 MED ADMIN — dextrose 5 % and 0.9 % sodium chloride intravenous solution: ORAL | @ 07:00:00 | NDC 00338008904

## 2016-09-11 NOTE — Nurses Notes (Signed)
Report received from day shift nurse  Kardex, MAR, and labs reviewed  PT assessed per flowsheet  Will continue to monitor

## 2016-09-11 NOTE — Respiratory Therapy (Signed)
Pt was successfully extubated to HFNC set at 55L, and 65% FiO2. Pt had no signs of stridor post extubation. Will continue to wean HFNC as tolerated and monitor pt.

## 2016-09-11 NOTE — OR Surgeon (Addendum)
PATIENT NAME: [name] [name], [name] [name]  HOSPITAL NUMBER:  [id number]  DATE OF SERVICE: 09/10/2016  DATE OF BIRTH:      OPERATIVE REPORT    PREOPERATIVE DIAGNOSIS:  Postoperative bleeding.    POSTOPERATIVE DIAGNOSIS:  Postoperative bleeding.    NAME OF PROCEDURE:  Exploratory median sternotomy; control chest wall bleeder.    SURGEON:  Vicente SereneHarold Electra Paladino, MD.    ASSISTANT:  Howell PringleKatherine Anne Lestitian, PA-C.    INDICATIONS FOR PROCEDURE:  This 51 year old man had undergone [name] robotically assisted MIDCAB yesterday.  He was taken back earlier today for persistent bleeding.  This exploration was done via the left mini-thoracotomy.  The bleeding site was noted at the heel of the distal anastomosis and there was no other apparent bleeding.  However, the patient continued to manifest hemodynamic, laboratory, and roentgenographic signs of ongoing bleeding into the left chest.    DESCRIPTION OF PROCEDURE:  With the patient in supine position under satisfactory general endotracheal anesthesia, the patient was prepped from the neck to the knees and sterile drapes applied.  [name] median sternotomy incision was carried out.  The soft tissues were divided with electrocautery and sternum longitudinally split with [name] saw.  Hemostasis was obtained.  There was no pericardial tamponade.  There was [name] large clotted hemothorax in the left chest.  No bleeding was noted at the distal anastomotic suture line.  However, the source of bleeding appeared to be around the cephalad extent of the mobilization which was around the third intercostal space.  It did not appear to be arterial, but was probably 1 of the venous branches of the accompanying mammary vein.  This was all controlled with hemoclips.  There were no other sources of bleeding noted.  Contractility of the heart appeared excellent.  The wound was copiously irrigated with warm antibiotic irrigation.  The sternum was closed with simple interrupted #5 stainless steel wire.  The fascia and  subcutaneous were closed with continuous 0 Vicryl and skin closed running subcuticular 4-0   Monocryl.  Dry sterile dressings were applied.  The sponge and instrument counts were correct.  The patient tolerated the procedure well and went to CVICU in stable condition.    Attestation:  Tonia BroomsKatie Lestitian provided crucial exposure and following suture during the case.  She assisted from incision to skin closure.  No resident was available.    Johny ShockHGR, MD        Vicente SereneHarold Khalib Fendley, MD                DD:  09/10/2016 19:15:03  DT:  09/11/2016 12:22:02 KG  D#:  [id number]

## 2016-09-11 NOTE — Nurses Notes (Signed)
First unit PRBC's initiated per order. S/S of reaction explained to patient and family, who verbalized understanding. VSS Will closely monitor.

## 2016-09-11 NOTE — Nurses Notes (Signed)
Pt became tachycardic (130s), hypertensive (systolics of 150-170), and short of breathing stating he is having trouble breathing. RT at bedside. Attempted to NT suction, pt did not tolerate. Maxed HFNC. PRN breathing treatment and pain medication administered. Pt finally agreable to CPAP machine. Dr. Melida QuitterMcCarthy at bedside to assess pt. 40mg  of lasix ordered and will be administered. Pt placed on CPAP. Pt now resting more comfortably sating mid 90s. Will continue to assess and monitor.

## 2016-09-11 NOTE — Nurses Notes (Signed)
Pt extubated by RT to High Flow Nasal Cannula 55L 65% FIO2. Pt tolerated well and no stridor or respiratory distress noted. Restraint Discontinuation    The need for the restrain is no longer present and the patient's needs can be addressed using less restrictive alternatives.

## 2016-09-11 NOTE — OR Surgeon (Addendum)
PATIENT NAME: [name] [name], [name] [name]  HOSPITAL NUMBER:  [id number]  DATE OF SERVICE: 09/10/2016  DATE OF BIRTH:      OPERATIVE REPORT    PREOPERATIVE DIAGNOSIS:  Clotted left hemothorax.    POSTOPERATIVE DIAGNOSIS:  Clotted left hemothorax.    NAME OF PROCEDURES:  1. Exploratory left thoracotomy.  2. Evacuation of left hemothorax.  3. Control distal anastomotic bleeder of LIMA to LAD.    SURGEON:  Vicente SereneHarold Brandyn Thien, MD.    ASSISTANT:  Florence Cannerebecca Irwin, PA-C.    INDICATIONS FOR PROCEDURE:  This 51 year old man had had [name] seemingly uneventful robotically-assisted MIDCAB.  He was extubated at the end of the procedure and was doing well until during the night when he developed mildly declining hemodynamics and tachycardia.  [name] repeat chest x-ray showed opacification of the left hemithorax.    DESCRIPTION OF PROCEDURE:  With the patient in the supine position under satisfactory general double-lumen endotracheal anesthesia, the patient was prepped from the neck to the groin and sterile drapes applied.  The previous median sternotomy incision was opened in its entirety and all the sutures removed.  There was [name] large amount of clotted blood in the left chest that was carefully evacuated.  There was noted to be oozing from the heel of the distal anastomosis of the LIMA to LAD.  This was controlled with an interrupted 7-0 Prolene.  No other sites of bleeding were noted.  The wound was copiously irrigated with warm antibiotic irrigation.  The left lung was reinflated.  The chest was then closed with running 0 Vicryl for the fascia and subcutaneous.  The skin was closed with running subcuticular 4-0 Monocryl.  Dry sterile dressings were   applied.  Sponge and instrument counts were correct.  The patient tolerated the procedure well and went to CVICU in stable condition.    Attestation:  Florence Cannerebecca Irwin provided vital suction, exposure, and following suture during the case.  She assisted from incision to skin closure.  No resident was  available.    Johny ShockHGR, MD        Vicente SereneHarold Tasheema Perrone, MD                DD:  09/10/2016 05:41:41  DT:  09/11/2016 00:20:02 MK  D#:  [id number]

## 2016-09-11 NOTE — Nurses Notes (Signed)
Pt to OR at this time for washout r/t increased CT output

## 2016-09-11 NOTE — Progress Notes (Signed)
[facility]  CARDIAC SURGERY ICU PROGRESS NOTE      Gumina,Sneijder A  Date of Admission:  09/06/2016  Date of Birth:      Hospital Day:  LOS: 2 days   Post-op Day:  1 Day Post-Op, 2 S/P  Minimally invasive CABG X1 via left anterior mini thoracotomy, POD 1  Median sternotomy Return to OR times two for evacuation of hematoma, repair LIMA to LAD anastamosis, clip to venous bleeder off of the IMA  Date of Service:  09/11/2016    SUBJECTIVE: Returned to OR yesterday times two for re-exploration. Pt now with median sternotomy. Remains intubated this AM. Off all gtts. Hemodynamically stable.    OBJECTIVE:    Vital Signs:  Temp (24hrs) Max:37.7 C (99.9 F)      Temperature: 37.7 C (99.9 F) (09/10/16 1630)  BP (Non-Invasive): (!) 137/91 (09/11/16 1100)  MAP (Non-Invasive): 103 mmHG (09/11/16 1100)  Heart Rate: (!) 135 (09/11/16 1100)  Respiratory Rate: (!) 25 (09/11/16 1100)  Pain Score (Numeric, Faces): 10 (09/11/16 0136)  SpO2-1: 95 % (09/11/16 1110)    Base (Admission) Weight:  Base Weight (ADM): 120.7 kg (266 lb 1.5 oz)  Weight:  Weight: 126.5 kg (278 lb 14.1 oz)    Hemodynamics (last 24 hrs):  CVP: 17 MM HG (09/11/16 1100)  ART-Line  MAP: 93 mmHg (09/11/16 1100)    Ventilator Settings:Conventional settings:  Set Rate: 10 Breaths Per Minute  Set PEEP: 5 cmH2O  Pressure Support: 10 cmH2O  PC Set: 16 cmH2O  FiO2: 50 %       Current Inpatient Medications:    Current Facility-Administered Medications:  acetaminophen (TYLENOL) tablet 650 mg Oral Q4H PRN   aspirin chewable tablet 81 mg 81 mg Oral Daily   atorvastatin (LIPITOR) tablet 80 mg Oral QPM   bisacodyl (DULCOLAX) rectal suppository 10 mg Rectal Daily PRN   budesonide (PULMICORT RESPULES) 0.5 mg/2 mL nebulizer suspension 0.5 mg Nebulization 2x/day   chlorhexidine gluconate (PERIDEX) 0.12% mouthwash 15 mL Topical 2x/day   docusate sodium (COLACE) capsule 100 mg Oral 2x/day   fentaNYL (SUBLIMAZE) 50 mcg/mL injection 50 mcg Intravenous Q2H PRN      fluticasone (FLONASE) 50 mcg per spray nasal spray 1 Spray Each Nostril Daily   heparin 5,000 unit/mL injection 5,000 Units Subcutaneous Q8HRS   ipratropium (ATROVENT) 0.02% nebulizer solution 0.5 mg Nebulization Q4H PRN   levalbuterol (XOPENEX) 1.25 mg/ 0.5 mL nebulizer solution 1.25 mg Nebulization Q4H PRN   magnesium hydroxide (MILK OF MAGNESIA) 400mg  per 5mL oral liquid 30 mL Oral Daily PRN   metoprolol tartrate (LOPRESSOR) tablet 12.5 mg Oral Q12H   montelukast (SINGULAIR) 10 mg tablet 10 mg Oral QPM   mupirocin (BACTROBAN) 2% topical ointment  Apply Topically 2x/day   NS bolus infusion 40 mL 40 mL Intravenous Once PRN   NS bolus infusion 40 mL 40 mL Intravenous Once PRN   NS flush syringe 2 mL Intracatheter Q8HRS   And      NS flush syringe 2-6 mL Intracatheter Q1 MIN PRN   NS premix infusion  Intravenous Continuous   ondansetron (ZOFRAN) 2 mg/mL injection 4 mg Intravenous Q8H PRN   oxyCODONE (ROXICODONE) immediate release tablet 5 mg Oral Q4H PRN   oxyCODONE (ROXICODONE) immediate release tablet 10 mg Oral Q4H PRN   pantoprazole (PROTONIX) delayed release tablet 40 mg Oral Daily before Breakfast   sennosides-docusate sodium (SENOKOT-S) 8.6-50mg  per tablet 1 Tab Oral 2x/day   SSIP insulin lispro (HUMALOG) 100 units/mL injection  2-6 Units Subcutaneous 4x/day PRN       Appropriate Home Meds restarted:  Yes    I/O:  I/O last 24 hours to current time:      Intake/Output Summary (Last 24 hours) at 09/11/16 1415  Last data filed at 09/11/16 1300   Gross per 24 hour   Intake             5776 ml   Output             3135 ml   Net             2641 ml     I/O last 3 completed shifts:  In: 6273 [I.V.:4320; Blood:1953]  Out: 3108 [Urine:1353; Blood:300; Chest Tube:1455]    Output 24 Hours 8 Hours   Urine      Chest Tube #1 1525 70   Chest Tube #2 100 180   Chest Tube #3     NG                 Nutrition/Diet:  ROOM SERVICE:  SEND AUTOMATIC HOUSE TRAY  MNT PROTOCOL FOR DIETITIAN  DIET CLEAR LIQUID Restrict fluids to: 1800  ML    Hardware (Lines, foley, tubes):   Date Placed Necessity Reviewed  Date Discontinued    Chest Tube #1       Chest Tube #2       Chest Tube #3       Pacer Wires:  monitor       Foley (out day number two)                      Labs:  Reviewed:  I have reviewed all lab results.      Radiology:    Reviewed:  CXR:  [facility] showed  left lung improvement. Still with left sided atelectasis      Physical Exam:    Constitutional:  acutely ill  Respiratory:  decreased breath sounds left > right  Cardiovascular:  regular rate and rhythm     Sinus tachycardia, 130  Gastrointestinal:  Soft, non-tender  Neurologic:  Grossly normal    ASSESSMENT/PLAN:    1. S/P Mid CAB, median sternotomy, return to OR times two for postoperative bleeding, doing well this AM  2. Wean vent to extubate today  3. Will give IV metoprolol times one and start BID metoprolol for sinus tachycardia  4. Aggressive pulm toilet  5. Acute post op blood loss anemia. Tx 2 units PRBC's today  6. AKI improved today. Creat improved and urine OP adequate  7. Cont chest tubes for inc dge  8. Cont ICU care     Problem List:  Active Hospital Problems    Diagnosis    Hypokalemia    Unstable angina (HCC)       PT/OT: Yes    Patient has decision making capacity:  no   DNR Status:  Full Code    DVT RISK FACTORS HAVE BEN ASSESSED AND PROPHYLAXIS ORDERED (SEE RUBYONLINE - REFERENCE TOOLS - MD, DVT PROPHY OR POCKET CARD):  NO    Disposition Planning: Home discharge      Joslyn DevonMichelle L Wolen, APRN-ACNP-BC    The patient required OR re-exploration x 2. He was extubated but has required supplemental oxygen for hypoxia.  Has required blood transfusions for post-op anemia.  We will follow chest tube output closely and follow respiratory status.  He has  also had acute kidney injury and we will follow urine output closely.  The patient is critically ill with coronary artery disease and s/p bypass surgery.  He additonally has post-op  anemia, acute kidney injury, post-operative pain and hypoxia.  The patient is critically ill and time spent with the patient is my own and in addition to time by other members og the CVICU team.   I have reviewed labs, allergies, medications and other notes.  Total critical care time 41 minutes.

## 2016-09-11 NOTE — Care Plan (Signed)
Problem: Patient Care Overview (Adult,OB)  Goal: Plan of Care Review(Adult,OB)  The patient and/or their representative will communicate an understanding of their plan of care   Outcome: Ongoing (see interventions/notes)  [name] came out of the OR intubated and was placed on mechanical ventilation with the following settings:  PC 16/f = 16/100%/+10.  ABG at 1943 = 7.29/48/71/22.2/-3.4.  SpO2 eventually increased and the FiO2 was weaned to 50%.  ABG at 0008 = 7.36/41/72/23.3/-2.1.  FiO2 was decreased to 40% and the rate was decreased to 10.  He was then changed to PSV 12/.40/+8.  ABG at 0454 = 7.39/40/65/24.4/-0.7.  No vent changes were made at this time.  [name] received his Pulmicort nebulizer treatment as scheduled.  Plan today is extubation.  Will continue to follow.

## 2016-09-11 NOTE — Ancillary Notes (Signed)
Patient is not appropriate for cardiac rehab at this time, patient intubated.   Patient in bed at this time with call bell in reach. Will continue monitoring and will follow up when appropriate for cardiac rehab.     Ike Vern Prestia BS ES  [facility] I Cardiac Rehab  Phone# [phone]

## 2016-09-11 NOTE — Nurses Notes (Signed)
Report received from dayshift RN. Plan of care, gtts, MAR, and Kardex reviewed. Will continue to monitor and assess see doc flowsheet for assessment.       Restraint Continuation    Patient assessed and found to have the following condition altered mental status The restraint was applied to facilitate medical/surgical treatment to ensure safety. The patient will continue to be evaluated and assessments documented on the flowsheet to ensure that the patient is released from the restraint at the earliest possible time.

## 2016-09-12 ENCOUNTER — Inpatient Hospital Stay (HOSPITAL_COMMUNITY): Payer: BC Managed Care – PPO

## 2016-09-12 DIAGNOSIS — R0689 Other abnormalities of breathing: Secondary | ICD-10-CM

## 2016-09-12 DIAGNOSIS — Z9889 Other specified postprocedural states: Secondary | ICD-10-CM

## 2016-09-12 DIAGNOSIS — I517 Cardiomegaly: Secondary | ICD-10-CM

## 2016-09-12 DIAGNOSIS — J811 Chronic pulmonary edema: Secondary | ICD-10-CM

## 2016-09-12 DIAGNOSIS — R0603 Acute respiratory distress: Secondary | ICD-10-CM

## 2016-09-12 LAB — BPAM PACKED CELL ORDER
UNIT DIVISION: 0
UNIT DIVISION: 0

## 2016-09-12 LAB — MANUAL DIFFERENTIAL (CELLAVISION)
BASOPHIL #: 0 x10ˆ3/uL (ref 0.00–0.20)
BASOPHIL %: 0 %
EOSINOPHIL #: 0 x10ˆ3/uL (ref 0.00–0.50)
EOSINOPHIL %: 0 %
LYMPHOCYTE #: 1.49 x10ˆ3/uL (ref 1.00–4.80)
LYMPHOCYTE %: 12 %
MONOCYTE #: 1.37 x10ˆ3/uL — ABNORMAL HIGH (ref 0.30–1.00)
MONOCYTE %: 11 %
NEUTROPHIL #: 9.35 x10ˆ3/uL — ABNORMAL HIGH (ref 1.50–7.70)
NEUTROPHIL %: 77 %

## 2016-09-12 LAB — CBC WITH DIFF
HCT: 28 % — ABNORMAL LOW (ref 36.7–47.0)
HGB: 9.7 g/dL — ABNORMAL LOW (ref 12.5–16.3)
MCH: 29.2 pg (ref 27.4–33.0)
MCHC: 34.7 g/dL (ref 32.5–35.8)
MCHC: 34.7 g/dL (ref 32.5–35.8)
MCV: 84.2 fL (ref 78.0–100.0)
MPV: 8.2 fL (ref 7.5–11.5)
PLATELETS: 128 x10ˆ3/uL — ABNORMAL LOW (ref 140–450)
RBC: 3.33 x10ˆ6/uL — ABNORMAL LOW (ref 4.06–5.63)
RDW: 16 % — ABNORMAL HIGH (ref 12.0–15.0)
WBC: 12.2 x10ˆ3/uL — ABNORMAL HIGH (ref 3.5–11.0)

## 2016-09-12 LAB — BASIC METABOLIC PANEL
ANION GAP: 9 mmol/L (ref 4–13)
BUN/CREA RATIO: 28 — ABNORMAL HIGH (ref 6–22)
BUN: 26 mg/dL — ABNORMAL HIGH (ref 8–25)
CALCIUM: 8.9 mg/dL (ref 8.5–10.2)
CHLORIDE: 107 mmol/L (ref 96–111)
CO2 TOTAL: 25 mmol/L (ref 22–32)
CREATININE: 0.94 mg/dL (ref 0.62–1.27)
ESTIMATED GFR: >59 mL/min/1.73mˆ2 (ref >59)
GLUCOSE: 125 mg/dL (ref 65–139)
POTASSIUM: 4.4 mmol/L (ref 3.5–5.1)
SODIUM: 141 mmol/L (ref 136–145)

## 2016-09-12 LAB — POC BLOOD GLUCOSE (RESULTS)
GLUCOSE, POC: 114 mg/dL — ABNORMAL HIGH (ref 70–105)
GLUCOSE, POC: 112 mg/dL — ABNORMAL HIGH (ref 70–105)
GLUCOSE, POC: 105 mg/dL (ref 70–105)
GLUCOSE, POC: 93 mg/dL (ref 70–105)
GLUCOSE, POC: 93 mg/dL (ref 70–105)

## 2016-09-12 LAB — MAGNESIUM: MAGNESIUM: 2.3 mg/dL (ref 1.6–2.5)

## 2016-09-12 LAB — TYPE AND CROSS RED CELLS - UNITS
ABO/RH(D): O POS
ANTIBODY SCREEN: NEGATIVE
UNITS ORDERED: 2

## 2016-09-12 LAB — IONIZED CALCIUM WITH PH
IONIZED CALCIUM: 1.18 mmol/L (ref 1.10–1.36)
PH (VENOUS): 7.47 — ABNORMAL HIGH (ref 7.31–7.41)

## 2016-09-12 MED ORDER — MAGNESIUM SULFATE 2 GRAM/50 ML (4 %) IN WATER INTRAVENOUS PIGGYBACK
2.0000 g | INJECTION | INTRAVENOUS | Status: AC
Start: 2016-09-12 — End: 2016-09-12
  Administered 2016-09-12: 0 g via INTRAVENOUS
  Administered 2016-09-12: 2 g via INTRAVENOUS
  Filled 2016-09-12: qty 50

## 2016-09-12 MED ORDER — DEXTROSE 5 % IN WATER (D5W) INTRAVENOUS SOLUTION
150.0000 mg | INTRAVENOUS | Status: AC
Start: 2016-09-12 — End: 2016-09-12
  Administered 2016-09-12: 0 mg via INTRAVENOUS
  Administered 2016-09-12: 150 mg via INTRAVENOUS

## 2016-09-12 MED ORDER — AMIODARONE 50 MG/ML INTRAVENOUS SOLUTION
INTRAVENOUS | Status: AC
Start: 2016-09-12 — End: 2016-09-12
  Filled 2016-09-12: qty 3

## 2016-09-12 MED ORDER — FUROSEMIDE 10 MG/ML INJECTION SOLUTION
40.0000 mg | Freq: Two times a day (BID) | INTRAMUSCULAR | Status: DC
Start: 2016-09-12 — End: 2016-09-15
  Administered 2016-09-12 – 2016-09-14 (×6): 40 mg via INTRAVENOUS
  Filled 2016-09-12 (×6): qty 4

## 2016-09-12 MED ORDER — METOPROLOL TARTRATE 50 MG TABLET
50.00 mg | ORAL_TABLET | Freq: Two times a day (BID) | ORAL | Status: DC
Start: 2016-09-12 — End: 2016-09-14
  Administered 2016-09-12 – 2016-09-14 (×5): 50 mg via ORAL
  Filled 2016-09-12 (×6): qty 1

## 2016-09-12 MED ORDER — FENTANYL (PF) 50 MCG/ML INJECTION SOLUTION
25.0000 ug | Freq: Once | INTRAMUSCULAR | Status: DC
Start: 2016-09-12 — End: 2016-09-12

## 2016-09-12 MED ORDER — METOPROLOL TARTRATE 5 MG/5 ML INTRAVENOUS SOLUTION
5.00 mg | INTRAVENOUS | Status: AC
Start: 2016-09-12 — End: 2016-09-12
  Administered 2016-09-12: 5 mg via INTRAVENOUS
  Filled 2016-09-12: qty 5

## 2016-09-12 MED ADMIN — mupirocin 2 % topical ointment: TOPICAL | @ 08:00:00

## 2016-09-12 MED ADMIN — heparin (porcine) 5,000 unit/mL injection solution: SUBCUTANEOUS | @ 20:00:00

## 2016-09-12 MED ADMIN — ONDANSETRON/ DEXAMETHASONE IVPB: INTRAVENOUS | @ 01:00:00

## 2016-09-12 MED ADMIN — HYDROcodone 5 mg-acetaminophen 325 mg tablet: TOPICAL | @ 09:00:00

## 2016-09-12 NOTE — Nurses Notes (Signed)
Report received from dayshift RN. Plan of care, gtts, MAR, and Kardex reviewed. Will continue to monitor and assess see doc flowsheet for assessment.

## 2016-09-12 NOTE — Ancillary Notes (Signed)
Visited patient for ambulation, patient recently ambulated with nurse for first ambulation of the day recently. Will follow for future ambulations. Patient in bed with call bell in reach.      Orma Renderarolyn La Shehan B.S. E.S.  Phone [phone]  Pager [phone]

## 2016-09-12 NOTE — Progress Notes (Signed)
[facility]  CARDIAC SURGERY ICU PROGRESS NOTE      Mahlum,Chandlar A  Date of Admission:  09/06/2016  Date of Birth:      Hospital Day:  LOS: 3 days   Post-op Day:  2 Days Post-Op, 2 S/P  Minimally invasive CABG X1 via left anterior mini thoracotomy, POD 1  Median sternotomy Return to OR times two for evacuation of hematoma, repair LIMA to LAD anastamosis, clip to venous bleeder off of the IMA  Date of Service:  09/12/2016    SUBJECTIVE: placed on BiPAP overnight for increased work of breathing  OBJECTIVE:    Vital Signs:  No data recorded.      Temperature: 37.7 C (99.9 F) (09/10/16 1630)  BP (Non-Invasive): 130/79 (09/12/16 0300)  MAP (Non-Invasive): 91 mmHG (09/12/16 0300)  Heart Rate: (!) 111 (09/12/16 0300)  Respiratory Rate: (!) 23 (09/12/16 0300)  Pain Score (Numeric, Faces): 10 (09/12/16 0246)  SpO2-1: 99 % (09/12/16 0352)    Base (Admission) Weight:  Base Weight (ADM): 120.7 kg (266 lb 1.5 oz)  Weight:  Weight: 126.5 kg (278 lb 14.1 oz)    Hemodynamics (last 24 hrs):  CVP: 15 MM HG (09/12/16 0300)  ART-Line  MAP: 93 mmHg (09/12/16 0300)    Ventilator Settings:Conventional settings:  Set PEEP: 5 cmH2O  Pressure Support: 10 cmH2O  FiO2: 50 %       Current Inpatient Medications:    Current Facility-Administered Medications:  acetaminophen (TYLENOL) tablet 650 mg Oral Q4H PRN   amiodarone (CORDARONE) 50 mg/mL injection ---Cabinet Override      aspirin chewable tablet 81 mg 81 mg Oral Daily   atorvastatin (LIPITOR) tablet 80 mg Oral QPM   bisacodyl (DULCOLAX) rectal suppository 10 mg Rectal Daily PRN   budesonide (PULMICORT RESPULES) 0.5 mg/2 mL nebulizer suspension 0.5 mg Nebulization 2x/day   chlorhexidine gluconate (PERIDEX) 0.12% mouthwash 15 mL Topical 2x/day   docusate sodium (COLACE) capsule 100 mg Oral 2x/day   fentaNYL (SUBLIMAZE) 50 mcg/mL injection 50 mcg Intravenous Q2H PRN   fluticasone (FLONASE) 50 mcg per spray nasal spray 1 Spray Each Nostril Daily   heparin 5,000 unit/mL  injection 5,000 Units Subcutaneous Q8HRS   ipratropium (ATROVENT) 0.02% nebulizer solution 0.5 mg Nebulization Q4H PRN   levalbuterol (XOPENEX) 1.25 mg/ 0.5 mL nebulizer solution 1.25 mg Nebulization Q4H PRN   lidocaine (LIDODERM) 5% patch 1 Patch Transdermal Daily   magnesium hydroxide (MILK OF MAGNESIA) 400mg  per 5mL oral liquid 30 mL Oral Daily PRN   metoprolol tartrate (LOPRESSOR) tablet 25 mg Oral Q12H   montelukast (SINGULAIR) 10 mg tablet 10 mg Oral QPM   mupirocin (BACTROBAN) 2% topical ointment  Apply Topically 2x/day   NS flush syringe 2 mL Intracatheter Q8HRS   And      NS flush syringe 2-6 mL Intracatheter Q1 MIN PRN   NS premix infusion  Intravenous Continuous   ondansetron (ZOFRAN) 2 mg/mL injection 4 mg Intravenous Q8H PRN   oxyCODONE (ROXICODONE) immediate release tablet 5 mg Oral Q4H PRN   oxyCODONE (ROXICODONE) immediate release tablet 10 mg Oral Q4H PRN   pantoprazole (PROTONIX) delayed release tablet 40 mg Oral Daily before Breakfast   sennosides-docusate sodium (SENOKOT-S) 8.6-50mg  per tablet 1 Tab Oral 2x/day   SSIP insulin lispro (HUMALOG) 100 units/mL injection 2-6 Units Subcutaneous 4x/day PRN       Appropriate Home Meds restarted:  Yes    I/O:  I/O last 24 hours to current time:      Intake/Output  Summary (Last 24 hours) at 09/12/16 0452  Last data filed at 09/12/16 0400   Gross per 24 hour   Intake             2212 ml   Output             3185 ml   Net             -973 ml     I/O last 3 completed shifts:  In: 2057 [P.O.:440; I.V.:857; Blood:760]  Out: 2455 [Urine:1665; Chest Tube:790]    Output 24 Hours 8 Hours   Urine      Chest Tube #1 1525 70   Chest Tube #2 100 180   Chest Tube #3     NG                 Nutrition/Diet:  ROOM SERVICE:  SEND AUTOMATIC HOUSE TRAY  MNT PROTOCOL FOR DIETITIAN  DIET CLEAR LIQUID Restrict fluids to: 1800 ML    Labs:  Reviewed:  I have reviewed all lab results.      Radiology:    Reviewed:  CXR:  Direct visualization of the image on 09/12/2016 on [facility] PACS  showed  left lung improvement. Still with left sided atelectasis      Physical Exam:    Constitutional:  acutely ill  Respiratory:  decreased breath sounds left > right  Cardiovascular:  regular rate and rhythm     Sinus tachycardia, 130  Gastrointestinal:  Soft, non-tender  Neurologic:  Grossly normal    ASSESSMENT/PLAN:    1. S/P Mid CAB, median sternotomy, return to OR times two for postoperative bleeding  2. Placed on BiPap overnight for increased work of breathing  3. Will give IV metoprolol times one and increase BID metoprolol for sinus tachycardia  4. Aggressive pulm toilet  5. Acute post op blood loss anemia. Resolved  6. AKI improved today. Creat improved and urine OP adequate, start Lasix 40mg IV BID  7. D/C chest tubes  8. Cont ICU care     Problem List:  Active Hospital Problems    Diagnosis    Hypokalemia    Unstable angina (HCC)       PT/OT: Yes    Patient has decision making capacity:  no   DNR Status:  Full Code    DVT RISK FACTORS HAVE BEN ASSESSED AND PROPHYLAXIS ORDERED (SEE RUBYONLINE - REFERENCE TOOLS - MD, DVT PROPHY OR POCKET CARD):  NO    Disposition Planning: Home discharge      Werner Lean, APRN-ACNP-BC  09/12/2016, 04:53    The patient had some increased work of breathing and has required non invasive ventilation.  We will be aggressive with pulmonary hygiene.  He has had some post-operative pain and we will treat pain and monitor his respiratory status.  Currently no signs of re-bleeding but he will need to be followed closely.  We will follow his urine output and his acute kidney injury.    Critical Care Attestation  I was present at the bedside of this critically ill patient.  I saw and examined the patient and discussed the patient with the [facility] team.  I agree with the current note and plan.  This patient suffers from CAD, hypoxia and respiratory distress, acute kidney injury and post-operative anemia.  The care of this patient was in regard to managing condition(s) that have a  high probability of sudden, clinically significant or life-threatening deterioration and require a high degree of attending  physician attention.  The data reviewed and care planning were performed in direct proximity of the patient.  All critical care time was spent exclusive of procedures which will be documented elsewhere in the chart.  My critical care time is independent and unique to other providers.  Medications, allergies, vital signs, lab tests, imaging, nursing notes and physician notes have been reviewed.  Total Critical Care Time:  38 minutes.

## 2016-09-12 NOTE — Nurses Notes (Signed)
Patient still becoming tachycardic after IV lopressor and PO Metoprolol ACNP Tillman AbideKelly Moccia notified. No orders given at this time. Will continue to monitor.

## 2016-09-12 NOTE — Nurses Notes (Signed)
Report received from day shift RN. MAR, labs, and Kardex reviewed. Assessment and vitals per flow sheet. Will continue to monitor.

## 2016-09-12 NOTE — Nurses Notes (Signed)
Pt having multiple episodes of atrial tachycardia in the 150s-170s. Service notified. amio bolus and 2g Mg ordered. AM labs drawn. Will continue to assess and monitor.

## 2016-09-12 NOTE — Care Management Notes (Signed)
[facility]  Care Management Note    Patient Name: [name] [name]  Date of Birth:   Sex: male  Date/Time of Admission: 09/06/2016  7:27 PM  Room/Bed: 12/A  Payor: BLUE CROSS BLUE SHIELD / Plan: [employer] [employer]/BS PPO / Product Type: PPO /    LOS: 3 days   PCP: Randol Kernarrie Leach, PA-C    Admitting Diagnosis:  Unstable angina (HCC) [I20.0]  Unstable angina (HCC) [I20.0]  Unstable angina (HCC) [I20.0]    Assessment:      09/12/16 1738   Assessment Details   Assessment Type Continued Assessment   Date of Care Management Update 09/12/16   Date of Next DCP Update 09/15/16   Care Management Plan   Discharge Planning Status plan in progress   Projected Discharge Date 09/16/16   CM will evaluate for rehabilitation potential yes   Patient aware of possible cost for ambulance transport?  No   Discharge Needs Assessment   Discharge Facility/Level Of Care Needs Home vs Home with Home Health   Transportation Available family or friend will provide   Per TBR, Patient required Bipap overnight for increased work of breathing, IV Metoprolol, aggressive pulm toilet, monitoring of labs: AKI improving, Cr & urine OP now adequate, Lasix, D/C chest tubes.  Discharge Plan:  Home vs home with Home Health    The patient will continue to be evaluated for developing discharge needs.     Case Manager: Estanislado SpireAmber Teven Mittman, RN  Phone: [phone]

## 2016-09-12 NOTE — Nurses Notes (Signed)
Patient is going into an irregular heart rhythm with RVR. ACNP Werner Leanonnie Goodwin notified and orders received for IV push 5mg  of Lopressor. Will continue to monitor hemodynamic status aggressively.

## 2016-09-12 NOTE — Care Plan (Signed)
Problem: Patient Care Overview (Adult,OB)  Goal: Plan of Care Review(Adult,OB)  The patient and/or their representative will communicate an understanding of their plan of care   Outcome: Ongoing (see interventions/notes)      Problem: Cardiac: ACS (Acute Coronary Syndrome) (Adult)  Prevent and manage potential problems including:  1. cardiovascular structural defects  2. chest pain (angina)  3. dysrhythmia/arrhythmia  4. embolism  5. heart failure/shock  6. ischemia leading to infarction  7. pericarditis  8. situational response   Goal: Signs and Symptoms of Listed Potential Problems Will be Absent, Minimized or Managed (Cardiac: ACS)  Signs and symptoms of listed potential problems will be absent, minimized or managed by discharge/transition of care (reference Cardiac: ACS (Acute Coronary Syndrome) (Adult) CPG).   Outcome: Completed Date Met: 09/12/16      Problem: Fall Risk (Adult)  Goal: Absence of Falls  Patient will demonstrate the desired outcomes by discharge/transition of care.   Outcome: Ongoing (see interventions/notes)      Problem: Skin Injury Risk (Adult,Obstetrics,Pediatric)  Goal: Identify Related Risk Factors and Signs and Symptoms  Related risk factors and signs and symptoms are identified upon initiation of Human Response Clinical Practice Guideline (CPG).   Outcome: Completed Date Met: 09/12/16    Goal: Skin Health and Integrity  Patient will demonstrate the desired outcomes by discharge/transition of care.   Outcome: Ongoing (see interventions/notes)      Problem: Non-violent/Non-Self Destructive Restraints  Goal: Alternative methods tried prior to restraints  Outcome: Completed Date Met: 09/12/16    Goal: Patient free from injury and discomfort  Outcome: Completed Date Met: 09/12/16    Goal: Autonomy maintained at the highest possible level  Outcome: Completed Date Met: 09/12/16    Goal: Need for restraints reassessed per policy  Outcome: Completed Date Met: 09/12/16    Goal: Patient education  provided  Outcome: Completed Date Met: 09/12/16    Goal: Problem Interventions  Outcome: Completed Date Met: 09/12/16

## 2016-09-12 NOTE — Care Plan (Signed)
Problem: Patient Care Overview (Adult,OB)  Goal: Plan of Care Review(Adult,OB)  The patient and/or their representative will communicate an understanding of their plan of care   Outcome: Ongoing (see interventions/notes)  Weaned pt HFNC to 50 lpm and 65%. Goal is to wean as tolerated and continue with therapies as ordered.  Respiratory Orders          Start       Ordered    09/12/16 2200  CPAP - ADULT Q HS (2200) QHS (2200) DiscontinueReschedule   Duration: Until Specified    Priority: Routine       Process Instructions: Full Face Bipap/CPAP orders require a separate nursing order for either "Naso-gastric Tube to suction" or an order for "No Naso-gastric Tube Required". Please enter another order for you preference seperately.         Patient's with continuous bipap orders should be re-evaluated DAILY to validate their continued need of this therapy.         Please also consider if patient's Bipap frequency is to allow for off time from the bipap that they may need a supplemental oxygen order during that time. Please place a separate order for the type of oxygen needed.         Patients with "CONTINUOUS" selected as a frequency should have an NPO order.                  Question Answer Comment   Delivery Mode FULL FACE    CPAP level (cm/H2O) 8 plz adjust accordingly   FIO2 (%) 50 plz adjust accordingly   Indications SLEEP APNEA    PRN CPAP allowed during day? Yes        09/12/16 0452    09/12/16 1900  OXYGEN - HIGH FLOW BLENDED NC (ADULTS) CONTINUOUS Discontinue   Duration: Until Specified    Priority: Routine       References: [place] - HIGH FLOW OXYGEN THERAPYUHC - HIGH FLOW ALGORITHM   Question Answer Comment   Flowrate (L/min) 50    Blended FIO2 65    Indications for O2 IMPROVE OXYGENATION        09/12/16 1723    09/09/16 1200  AIRWAY CLEARANCE EVERY 4 H WHILE AWAKE (0800, 1200, 1600, 2000, 0000) DiscontinueReschedule   Duration: Until Specified    Priority: Routine       Process  Instructions: CPT orders with frequencies of q2h or q3h will only be honored for Cystic Fibrosis Patients   Question Answer Comment   Type of Therapy Requested PEP VALVE    Indications: PREVENT/TREAT ATELECTASIS        09/09/16 1154    09/09/16 1130  OXYGEN WEANING PARAMETERS UNTIL DISCONTINUED Discontinue   Comments: Specify Order Details:   Duration: Until Specified    Priority: Routine       Question Answer Comment   Wean to FIO2 of(%) ROOM AIR (21%)    Keep O2 Sats>(%) 92%        09/09/16 1124      MAR Note     Note Date and Time Entered User    No MAR note exists for this admission.        Respiratory Medications          Start       Ordered Stop    09/09/16 1123  ipratropium (ATROVENT) 0.02% nebulizer solution 0.5 mg, Nebulization, EVERY 4 HOURS PRN     09/09/16 1124 --    09/09/16 1123  levalbuterol (  XOPENEX) 1.25 mg/ 0.5 mL nebulizer solution 1.25 mg, Nebulization, EVERY 4 HOURS PRN    Question: Please document your rationale for use here: Answer: Post-op cardiac surgery    09/09/16 1124 --    09/07/16 0800  budesonide (PULMICORT RESPULES) 0.5 mg/2 mL nebulizer suspension 0.5 mg, Nebulization, 2 TIMES DAILY

## 2016-09-12 NOTE — Care Plan (Signed)
Problem: Patient Care Overview (Adult,OB)  Goal: Plan of Care Review(Adult,OB)  The patient and/or their representative will communicate an understanding of their plan of care   Outcome: Ongoing (see interventions/notes)  [name] [name] started the shift sitting in a chair on a HFNC 55 lpm, 65%.  SpO2 was dropping, therefore, the FiO2 was increased to 70%.  He refused performing the PEP valve.  He refused being placed on CPAP.  He was moved from the chair to the bed.  Shortly thereafter, he became short of breath and began labored breathing.  He was given nebulizer treatments containing Xopenex/Atrovent and Pulmicort.  He was placed on CPAP 8, 100%.  SpO2 was staying in the high 90s, so the FiO2 was weaned to 50%.  This morning, [name] [name] was taken off CPAP and was placed back on the HFNC so he could get back into the chair.  Plan today is to continue current treatment regimen.  Will continue to follow.

## 2016-09-13 LAB — POC BLOOD GLUCOSE (RESULTS)
GLUCOSE, POC: 100 mg/dL (ref 70–105)
GLUCOSE, POC: 107 mg/dL — ABNORMAL HIGH (ref 70–105)
GLUCOSE, POC: 103 mg/dL (ref 70–105)
GLUCOSE, POC: 96 mg/dL (ref 70–105)

## 2016-09-13 LAB — ECG 12-LEAD
Atrial Rate: 79 {beats}/min
Calculated P Axis: 53 degrees
Calculated R Axis: 119 degrees
Calculated T Axis: 31 degrees
PR Interval: 168 ms
QRS Duration: 90 ms
QT Interval: 382 ms
QTC Calculation: 438 ms
Ventricular rate: 79 {beats}/min

## 2016-09-13 MED ORDER — MAGNESIUM CITRATE ORAL SOLUTION
296.0000 mL | Freq: Once | ORAL | Status: AC
Start: 2016-09-13 — End: 2016-09-13
  Administered 2016-09-13: 296 mL via ORAL
  Filled 2016-09-13: qty 296

## 2016-09-13 MED ORDER — CLOPIDOGREL 75 MG TABLET
75.0000 mg | ORAL_TABLET | Freq: Every day | ORAL | Status: DC
Start: 2016-09-13 — End: 2016-09-16
  Administered 2016-09-13 – 2016-09-16 (×4): 75 mg via ORAL
  Filled 2016-09-13 (×6): qty 1

## 2016-09-13 MED ORDER — ALUMINUM-MAG HYDROXIDE-SIMETHICONE 400 MG-400 MG-40 MG/5 ML ORAL SUSP
15.0000 mL | Freq: Four times a day (QID) | ORAL | Status: DC | PRN
Start: 2016-09-13 — End: 2016-09-14
  Administered 2016-09-13: 15 mL via ORAL
  Filled 2016-09-13: qty 30

## 2016-09-13 MED ORDER — FAMOTIDINE 20 MG TABLET
20.00 mg | ORAL_TABLET | Freq: Two times a day (BID) | ORAL | Status: DC
Start: 2016-09-14 — End: 2016-09-14
  Administered 2016-09-14 (×2): 20 mg via ORAL
  Filled 2016-09-13: qty 1

## 2016-09-13 MED ADMIN — furosemide 10 mg/mL injection solution: INTRAVENOUS | @ 09:00:00

## 2016-09-13 MED ADMIN — mupirocin 2 % topical ointment: TOPICAL | @ 20:00:00

## 2016-09-13 MED ADMIN — magnesium citrate oral solution: ORAL | @ 12:00:00

## 2016-09-13 MED ADMIN — lactated Ringers intravenous solution: SUBCUTANEOUS | @ 20:00:00 | NDC 00338011704

## 2016-09-13 NOTE — Ancillary Notes (Signed)
Attempted to see patient at bedside.  Did not ambulate d/t wishing to rest at this time.  RN stated that patient has been OOB frequently today.  Will continue to follow and attempt to ambulate when appropriate.  Patient in bed with call bell in reach.      Hulan SaasLyndsey Boor, RN, HerbalistBSN  Cardiac Rehab  Pager [phone]  Phone [phone]

## 2016-09-13 NOTE — Care Plan (Signed)
Problem: Patient Care Overview (Adult,OB)  Goal: Plan of Care Review(Adult,OB)  The patient and/or their representative will communicate an understanding of their plan of care   Outcome: Ongoing (see interventions/notes)  Pt stable on HFNC 60% 50 lpm overnight. Pt wore bipap for about 2 hrs and requested to come off. Will continue to follow and monitor patient's respiratory status weaning as tolerated.

## 2016-09-13 NOTE — Progress Notes (Signed)
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, POD 1  Median sternotomy Return to OR times two for evacuation of hematoma, repair LIMA to LAD anastamosis, clip to venous bleeder off of the IMA  Date of Service:  09/13/2016    SUBJECTIVE: placed on BiPAP overnight for increased work of breathing  OBJECTIVE:    Vital Signs:  Temp (24hrs) Max:37.1 C (98.8 F)      Temperature: 36.9 C (98.4 F) (09/13/16 0352)  BP (Non-Invasive): 119/85 (09/13/16 1200)  MAP (Non-Invasive): 95 mmHG (09/13/16 1200)  Heart Rate: 96 (09/13/16 1200)  Respiratory Rate: 16 (09/13/16 1200)  Pain Score (Numeric, Faces): 10 (09/13/16 1152)  SpO2-1: 98 % (09/13/16 1200)    Base (Admission) Weight:  Base Weight (ADM): 120.7 kg (266 lb 1.5 oz)  Weight:  Weight: 123.9 kg (273 lb 2.4 oz)  Current Inpatient Medications:    Current Facility-Administered Medications:  acetaminophen (TYLENOL) tablet 650 mg Oral Q4H PRN   aluminum-magnesium hydroxide-simethicone (MAALOX MAX) 400-400-40mg  per 5mL oral liquid 15 mL Oral 4x/day PRN   aspirin chewable tablet 81 mg 81 mg Oral Daily   atorvastatin (LIPITOR) tablet 80 mg Oral QPM   bisacodyl (DULCOLAX) rectal suppository 10 mg Rectal Daily PRN   budesonide (PULMICORT RESPULES) 0.5 mg/2 mL nebulizer suspension 0.5 mg Nebulization 2x/day   chlorhexidine gluconate (PERIDEX) 0.12% mouthwash 15 mL Topical 2x/day   clopidogrel (PLAVIX) 75 mg tablet 75 mg Oral Daily   docusate sodium (COLACE) capsule 100 mg Oral 2x/day   [START ON 09/14/2016] famotidine (PEPCID) tablet 20 mg Oral 2x/day   fluticasone (FLONASE) 50 mcg per spray nasal spray 1 Spray Each Nostril Daily   furosemide (LASIX) 10 mg/mL injection 40 mg Intravenous 2x/day   heparin 5,000 unit/mL injection 5,000 Units Subcutaneous Q8HRS      ipratropium (ATROVENT) 0.02% nebulizer solution 0.5 mg Nebulization Q4H PRN   levalbuterol (XOPENEX) 1.25 mg/ 0.5 mL nebulizer solution 1.25 mg Nebulization Q4H PRN   lidocaine (LIDODERM) 5% patch 1 Patch Transdermal Daily   magnesium hydroxide (MILK OF MAGNESIA) 400mg  per 5mL oral liquid 30 mL Oral Daily PRN   metoprolol tartrate (LOPRESSOR) tablet 50 mg Oral Q12H   montelukast (SINGULAIR) 10 mg tablet 10 mg Oral QPM   mupirocin (BACTROBAN) 2% topical ointment  Apply Topically 2x/day   NS flush syringe 2 mL Intracatheter Q8HRS   And      NS flush syringe 2-6 mL Intracatheter Q1 MIN PRN   NS premix infusion  Intravenous Continuous   ondansetron (ZOFRAN) 2 mg/mL injection 4 mg Intravenous Q8H PRN   oxyCODONE (ROXICODONE) immediate release tablet 5 mg Oral Q4H PRN   oxyCODONE (ROXICODONE) immediate release tablet 10 mg Oral Q4H PRN   sennosides-docusate sodium (SENOKOT-S) 8.6-50mg  per tablet 1 Tab Oral 2x/day   SSIP insulin lispro (HUMALOG) 100 units/mL injection 2-6 Units Subcutaneous 4x/day PRN       Appropriate Home Meds restarted:  Yes    I/O:  I/O last 24 hours to current time:      Intake/Output Summary (Last 24 hours) at 09/13/16 1231  Last data filed at 09/13/16 1200   Gross per 24 hour   Intake  851 ml   Output             2765 ml   Net            -1914 ml     I/O last 3 completed shifts:  In: 781 [P.O.:700; I.V.:81]  Out: 3045 [Urine:2785; Chest Tube:260]    Output 24 Hours 8 Hours   Urine  3735 675   Chest Tube #1 110 30   Chest Tube #2 190 40   Chest Tube #3     NG                 Nutrition/Diet:  ROOM SERVICE:  SEND AUTOMATIC HOUSE TRAY  MNT PROTOCOL FOR DIETITIAN  DIET CARDIAC (4G NA,LOWFAT,LOWCHOL)    Labs:  Reviewed:  I have reviewed all lab results.      Radiology:    Reviewed:  CXR:  Direct [facility] showed  left lung improvement. Still with left sided atelectasis      Physical Exam:    Constitutional:  acutely ill  Respiratory:  decreased  breath sounds left > right  Cardiovascular:  regular rate and rhythm     Sinus tachycardia, 130  Gastrointestinal:  Soft, non-tender  Neurologic:  Grossly normal    ASSESSMENT/PLAN:    1. S/P Mid CAB, median sternotomy, return to OR times two for postoperative bleeding  2. Placed on BiPap overnight for increased work of breathing  3. Will start Plavix 75 mg daily  4. Aggressive pulm toilet  5. Acute post op blood loss anemia. Resolved  6. AKI improved today. Creat improved and urine OP adequate, start Lasix 40mg IV BID  7. D/C chest tubes  8. Cont ICU care     Problem List:  Active Hospital Problems    Diagnosis    Hypokalemia    Unstable angina (HCC)       PT/OT: Yes    Patient has decision making capacity:  no   DNR Status:  Full Code    DVT RISK FACTORS HAVE BEN ASSESSED AND PROPHYLAXIS ORDERED (SEE RUBYONLINE - REFERENCE TOOLS - MD, DVT PROPHY OR POCKET CARD):  NO    Disposition Planning: Home discharge      Werner LeanDonnie Goodwin, APRN-ACNP-BC  09/13/2016, 12:31  The patient was seen and examined and discussed with the CV ICU team.  I agree with the note and plan.  This is an E and M visit.    Cherlynn KaiserPaul Ewart Carrera, MD

## 2016-09-13 NOTE — Nurses Notes (Signed)
Day shift report received. Kardex, MAR, labs and plan of care reviewed. See flow sheet for complete detailed assessment and vital signs.

## 2016-09-13 NOTE — Care Plan (Signed)
Medical Nutrition Therapy NPO note  I: Diet: NPO/CL for greater than 5 days  P: Recommend advance diet as tolerated.  If unable to advance diet, please consider alternate means of nutrition.  RD available for recommendations prn.

## 2016-09-13 NOTE — Care Plan (Signed)
Problem: Patient Care Overview (Adult,OB)  Goal: Plan of Care Review(Adult,OB)  The patient and/or their representative will communicate an understanding of their plan of care   Outcome: Ongoing (see interventions/notes)  Patient was weaned from HFNC to nasal cannula 6liters/ minute, continue to wean as tolerated and encourage to deep breathe and cough with I.s and PEP.

## 2016-09-14 ENCOUNTER — Inpatient Hospital Stay (HOSPITAL_COMMUNITY): Payer: BC Managed Care – PPO

## 2016-09-14 DIAGNOSIS — J9811 Atelectasis: Secondary | ICD-10-CM

## 2016-09-14 DIAGNOSIS — J90 Pleural effusion, not elsewhere classified: Secondary | ICD-10-CM

## 2016-09-14 DIAGNOSIS — I517 Cardiomegaly: Secondary | ICD-10-CM

## 2016-09-14 DIAGNOSIS — R0689 Other abnormalities of breathing: Secondary | ICD-10-CM

## 2016-09-14 LAB — MAGNESIUM: MAGNESIUM: 2.3 mg/dL (ref 1.6–2.5)

## 2016-09-14 LAB — BASIC METABOLIC PANEL
ANION GAP: 10 mmol/L (ref 4–13)
BUN/CREA RATIO: 24 — ABNORMAL HIGH (ref 6–22)
BUN: 21 mg/dL (ref 8–25)
CALCIUM: 8.6 mg/dL (ref 8.5–10.2)
CHLORIDE: 102 mmol/L (ref 96–111)
CO2 TOTAL: 33 mmol/L — ABNORMAL HIGH (ref 22–32)
CO2 TOTAL: 33 mmol/L — ABNORMAL HIGH (ref 22–32)
CREATININE: 0.86 mg/dL (ref 0.62–1.27)
ESTIMATED GFR: >59 mL/min/1.73mˆ2 (ref >59)
GLUCOSE: 88 mg/dL (ref 65–139)
POTASSIUM: 3.5 mmol/L (ref 3.5–5.1)
SODIUM: 145 mmol/L (ref 136–145)

## 2016-09-14 LAB — CBC
HCT: 28.6 % — ABNORMAL LOW (ref 36.7–47.0)
HGB: 9.9 g/dL — ABNORMAL LOW (ref 12.5–16.3)
MCH: 29.9 pg (ref 27.4–33.0)
MCHC: 34.6 g/dL (ref 32.5–35.8)
MCV: 86.2 fL (ref 78.0–100.0)
MPV: 8.9 fL (ref 7.5–11.5)
PLATELETS: 183 10*3/uL (ref 140–450)
PLATELETS: 183 x10ˆ3/uL (ref 140–450)
RBC: 3.32 x10ˆ6/uL — ABNORMAL LOW (ref 4.06–5.63)
RDW: 15.5 % — ABNORMAL HIGH (ref 12.0–15.0)
WBC: 7.9 x10ˆ3/uL (ref 3.5–11.0)

## 2016-09-14 LAB — POC BLOOD GLUCOSE (RESULTS)
GLUCOSE, POC: 94 mg/dL (ref 70–105)
GLUCOSE, POC: 103 mg/dL (ref 70–105)
GLUCOSE, POC: 107 mg/dL — ABNORMAL HIGH (ref 70–105)

## 2016-09-14 MED ORDER — LEVALBUTEROL 0.63 MG/3 ML SOLUTION FOR NEBULIZATION
0.6300 mg | INHALATION_SOLUTION | Freq: Three times a day (TID) | RESPIRATORY_TRACT | Status: DC | PRN
Start: 2016-09-14 — End: 2016-09-16
  Administered 2016-09-15: 0.63 mg via RESPIRATORY_TRACT
  Filled 2016-09-14 (×2): qty 1

## 2016-09-14 MED ORDER — MELATONIN 3 MG TABLET
3.00 mg | ORAL_TABLET | Freq: Every evening | ORAL | Status: DC | PRN
Start: 2016-09-14 — End: 2016-09-16
  Administered 2016-09-14: 3 mg via ORAL
  Filled 2016-09-14: qty 1

## 2016-09-14 MED ORDER — POTASSIUM CHLORIDE ER 20 MEQ TABLET,EXTENDED RELEASE(PART/CRYST)
40.00 meq | ORAL_TABLET | Freq: Two times a day (BID) | ORAL | Status: DC
Start: 2016-09-14 — End: 2016-09-15
  Administered 2016-09-14 (×2): 40 meq via ORAL
  Filled 2016-09-14 (×4): qty 2

## 2016-09-14 MED ORDER — METOPROLOL TARTRATE 25 MG TABLET
75.00 mg | ORAL_TABLET | Freq: Two times a day (BID) | ORAL | Status: DC
Start: 2016-09-14 — End: 2016-09-16
  Administered 2016-09-14 – 2016-09-16 (×4): 75 mg via ORAL
  Filled 2016-09-14 (×5): qty 1

## 2016-09-14 MED ORDER — DOCUSATE SODIUM 100 MG CAPSULE
100.0000 mg | ORAL_CAPSULE | Freq: Two times a day (BID) | ORAL | Status: DC
Start: 2016-09-14 — End: 2016-09-14

## 2016-09-14 MED ORDER — IPRATROPIUM BROMIDE 0.02 % SOLUTION FOR INHALATION
0.5000 mg | Freq: Four times a day (QID) | RESPIRATORY_TRACT | Status: DC | PRN
Start: 2016-09-14 — End: 2016-09-16
  Administered 2016-09-15 (×2): 0.5 mg via RESPIRATORY_TRACT
  Filled 2016-09-14: qty 1

## 2016-09-14 MED ORDER — SODIUM CHLORIDE 0.9 % (FLUSH) INJECTION SYRINGE
2.0000 mL | INJECTION | INTRAMUSCULAR | Status: DC | PRN
Start: 2016-09-14 — End: 2016-09-15

## 2016-09-14 MED ORDER — SIMETHICONE 80 MG CHEWABLE TABLET
80.00 mg | CHEWABLE_TABLET | Freq: Four times a day (QID) | ORAL | Status: DC | PRN
Start: 2016-09-14 — End: 2016-09-16
  Administered 2016-09-14: 80 mg via ORAL
  Filled 2016-09-14 (×2): qty 1

## 2016-09-14 MED ORDER — PANTOPRAZOLE 40 MG TABLET,DELAYED RELEASE
40.0000 mg | DELAYED_RELEASE_TABLET | Freq: Every day | ORAL | Status: DC
Start: 2016-09-15 — End: 2016-09-16
  Administered 2016-09-15 – 2016-09-16 (×2): 40 mg via ORAL
  Filled 2016-09-14 (×2): qty 1

## 2016-09-14 MED ORDER — SODIUM CHLORIDE 0.9 % (FLUSH) INJECTION SYRINGE
2.0000 mL | INJECTION | Freq: Three times a day (TID) | INTRAMUSCULAR | Status: DC
Start: 2016-09-14 — End: 2016-09-15
  Administered 2016-09-14 – 2016-09-15 (×2): 2 mL

## 2016-09-14 MED ORDER — POTASSIUM CHLORIDE ER 20 MEQ TABLET,EXTENDED RELEASE(PART/CRYST)
20.0000 meq | ORAL_TABLET | Freq: Once | ORAL | Status: DC | PRN
Start: 2016-09-14 — End: 2016-09-16
  Filled 2016-09-14: qty 1

## 2016-09-14 MED ADMIN — oxyCODONE 5 mg tablet: ORAL | @ 12:00:00

## 2016-09-14 MED ADMIN — metoprolol tartrate 50 mg tablet: ORAL | @ 08:00:00

## 2016-09-14 MED ADMIN — oxyCODONE 5 mg tablet: ORAL | @ 18:00:00

## 2016-09-14 MED ADMIN — sodium chloride 0.9 % intravenous solution: ORAL | @ 14:00:00 | NDC 00338004904

## 2016-09-14 MED ADMIN — oxyCODONE-acetaminophen 5 mg-325 mg tablet: ORAL | @ 18:00:00

## 2016-09-14 MED ADMIN — sodium chloride 0.9 % (flush) injection syringe: TOPICAL | @ 08:00:00

## 2016-09-14 MED ADMIN — sodium chloride 0.9 % intravenous solution: @ 22:00:00 | NDC 00338004904

## 2016-09-14 NOTE — Progress Notes (Signed)
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, POD 1  Median sternotomy Return to OR times two for evacuation of hematoma, repair LIMA to LAD anastamosis, clip to venous bleeder off of the IMA  Date of Service:  09/14/2016    SUBJECTIVE: will transfer to step down today  OBJECTIVE:    Vital Signs:  Temp (24hrs) Max:37.1 C (98.8 F)      Temperature: 36.3 C (97.3 F) (09/14/16 1600)  BP (Non-Invasive): 128/87 (09/14/16 1400)  MAP (Non-Invasive): 100 mmHG (09/14/16 1400)  Heart Rate: (!) 105 (09/14/16 1400)  Respiratory Rate: 17 (09/14/16 1400)  Pain Score (Numeric, Faces): 9 (09/14/16 1754)  SpO2-1: 97 % (09/14/16 1100)    Base (Admission) Weight:  Base Weight (ADM): 120.7 kg (266 lb 1.5 oz)  Weight:  Weight: 120.4 kg (265 lb 6.9 oz)  Current Inpatient Medications:    Current Facility-Administered Medications:  acetaminophen (TYLENOL) tablet 650 mg Oral Q4H PRN   aspirin chewable tablet 81 mg 81 mg Oral Daily   atorvastatin (LIPITOR) tablet 80 mg Oral QPM   bisacodyl (DULCOLAX) rectal suppository 10 mg Rectal Daily PRN   budesonide (PULMICORT RESPULES) 0.5 mg/2 mL nebulizer suspension 0.5 mg Nebulization 2x/day   chlorhexidine gluconate (PERIDEX) 0.12% mouthwash 15 mL Topical 2x/day   clopidogrel (PLAVIX) 75 mg tablet 75 mg Oral Daily   docusate sodium (COLACE) capsule 100 mg Oral 2x/day   docusate sodium (COLACE) capsule 100 mg Oral 2x/day   famotidine (PEPCID) tablet 20 mg Oral 2x/day   fluticasone (FLONASE) 50 mcg per spray nasal spray 1 Spray Each Nostril Daily   furosemide (LASIX) 10 mg/mL injection 40 mg Intravenous 2x/day   heparin 5,000 unit/mL injection 5,000 Units Subcutaneous Q8HRS   ipratropium (ATROVENT) 0.02% nebulizer solution 0.5 mg Nebulization Q6H PRN   levalbuterol  (XOPENEX) 0.63 mg/ 3 mL nebulizer solution 0.63 mg Nebulization Q8H PRN   magnesium hydroxide (MILK OF MAGNESIA) 400mg  per 5mL oral liquid 30 mL Oral Daily PRN   metoprolol (LOPRESSOR) tablet 75 mg 75 mg Oral Q12H   montelukast (SINGULAIR) 10 mg tablet 10 mg Oral QPM   NS flush syringe 2 mL Intracatheter Q8HRS   And      NS flush syringe 2-6 mL Intracatheter Q1 MIN PRN   NS flush syringe 2 mL Intracatheter Q8HRS   And      NS flush syringe 2-6 mL Intracatheter Q1 MIN PRN   ondansetron (ZOFRAN) 2 mg/mL injection 4 mg Intravenous Q8H PRN   oxyCODONE (ROXICODONE) immediate release tablet 5 mg Oral Q4H PRN   oxyCODONE (ROXICODONE) immediate release tablet 10 mg Oral Q4H PRN   [START ON 09/15/2016] pantoprazole (PROTONIX) delayed release tablet 40 mg Oral Daily   potassium chloride (K-DUR) extended release tablet 20 mEq Oral Once PRN   potassium chloride (K-DUR) extended release tablet 40 mEq Oral 2x/day-Food   sennosides-docusate sodium (SENOKOT-S) 8.6-50mg  per tablet 1 Tab Oral 2x/day   simethicone (MYLICON) chewable tablet 80 mg Oral 4x/day PRN   SSIP insulin lispro (HUMALOG) 100 units/mL injection 2-6 Units Subcutaneous 4x/day PRN       Appropriate Home Meds restarted:  Yes    I/O:  I/O last 24 hours to current time:  Intake/Output Summary (Last 24 hours) at 09/14/16 1759  Last data filed at 09/14/16 0800   Gross per 24 hour   Intake              400 ml   Output             1300 ml   Net             -900 ml     I/O last 3 completed shifts:  In: 400 [P.O.:400]  Out: 1340 [Urine:1300; Chest Tube:40]    Output 24 Hours 8 Hours   Urine  3075 0   Chest Tube #1     Chest Tube #2     Chest Tube #3     NG                 Nutrition/Diet:  ROOM SERVICE:  SEND AUTOMATIC HOUSE TRAY  MNT PROTOCOL FOR DIETITIAN  DIET CARDIAC (4G NA,LOWFAT,LOWCHOL)    Labs:  Reviewed:  I have reviewed all lab results.      Radiology:    Reviewed:  CXR:  Direct [facility] showed  left lung improvement.  Still with left sided atelectasis      Physical Exam:    Constitutional:  acutely ill  Respiratory:  decreased breath sounds left > right  Cardiovascular:  regular rate and rhythm     Sinus tachycardia, 130  Gastrointestinal:  Soft, non-tender  Neurologic:  Grossly normal    ASSESSMENT/PLAN:    1. S/P Mid CAB, median sternotomy, return to OR times two for postoperative bleeding  2. Wean off BiPAP overnight  3. Will continue Plavix 75 mg daily  4. Aggressive pulm toilet  5. Acute post op blood loss anemia. Resolved  6. AKI improved today. Creat improved and urine OP adequate  7. D/C chest tubes yesterday  8. Transfer to step down    Problem List:  Active Hospital Problems    Diagnosis   . Hypokalemia   . Unstable angina (HCC)       PT/OT: Yes    Patient has decision making capacity:  no   DNR Status:  Full Code    DVT RISK FACTORS HAVE BEN ASSESSED AND PROPHYLAXIS ORDERED (SEE RUBYONLINE - REFERENCE TOOLS - MD, DVT PROPHY OR POCKET CARD):  NO    Disposition Planning: Home discharge      Werner LeanDonnie Goodwin, APRN-ACNP-BC  09/14/2016, 17:59  The patient was seen and examined and discussed with the CV ICU team.  I agree with the note and plan.  This is an E and M visit.    Cherlynn KaiserPaul Michiko Lineman, MD

## 2016-09-14 NOTE — Nurses Notes (Signed)
Report received from RN. Kardex, labs, MAR and plan of care reviewed. Assessment and VS per flowsheet. Will continue to monitor patient.

## 2016-09-14 NOTE — Care Management Notes (Signed)
[facility]  Care Management Note    Patient Name: [name] [name]  Date of Birth:   Sex: male  Date/Time of Admission: 09/06/2016  7:27 PM  Room/Bed: 28/A  Payor: BLUE CROSS BLUE SHIELD / Plan: [place] BC/BS PPO / Product Type: PPO /    LOS: 5 days   PCP: Randol Kernarrie Leach, PA-C    Admitting Diagnosis:  Unstable angina (HCC) [I20.0]  Unstable angina (HCC) [I20.0]  Unstable angina (HCC) [I20.0]    Assessment:      09/14/16 1748   Assessment Details   Assessment Type Continued Assessment   Date of Care Management Update 09/14/16   Date of Next DCP Update 09/16/16   Care Management Plan   Discharge Planning Status plan in progress   Projected Discharge Date 09/19/16   CM will evaluate for rehabilitation potential yes   Pt remains on 6ltrs O2  Move to stepdown today per TBR    Discharge Plan:  Home vs home with Home Health  Will meet with Pt to discuss HH options.     The patient will continue to be evaluated for developing discharge needs.     Case Manager: Milderd Meagerarrie Shyana Kulakowski, SOCIAL WORKER  Phone: [phone]

## 2016-09-15 ENCOUNTER — Inpatient Hospital Stay (HOSPITAL_COMMUNITY): Payer: BC Managed Care – PPO

## 2016-09-15 DIAGNOSIS — J90 Pleural effusion, not elsewhere classified: Secondary | ICD-10-CM

## 2016-09-15 DIAGNOSIS — J984 Other disorders of lung: Secondary | ICD-10-CM

## 2016-09-15 LAB — PT/INR
INR: 0.99 (ref 0.80–1.20)
PROTHROMBIN TIME: 11.5 s (ref 9.3–13.9)

## 2016-09-15 LAB — ELECTROLYTES
ANION GAP: 9 mmol/L (ref 4–13)
CHLORIDE: 99 mmol/L (ref 96–111)
CO2 TOTAL: 33 mmol/L — ABNORMAL HIGH (ref 22–32)
POTASSIUM: 3.6 mmol/L (ref 3.5–5.1)
SODIUM: 141 mmol/L (ref 136–145)

## 2016-09-15 LAB — CBC WITH DIFF
BASOPHIL #: 0.04 x10ˆ3/uL (ref 0.00–0.20)
BASOPHIL %: 1 %
EOSINOPHIL #: 0.39 10*3/uL (ref 0.00–0.50)
EOSINOPHIL #: 0.39 x10ˆ3/uL (ref 0.00–0.50)
EOSINOPHIL %: 5 %
HCT: 28.1 % — ABNORMAL LOW (ref 36.7–47.0)
HGB: 9.4 g/dL — ABNORMAL LOW (ref 12.5–16.3)
HGB: 9.4 g/dL — ABNORMAL LOW (ref 12.5–16.3)
LYMPHOCYTE #: 1.58 x10ˆ3/uL (ref 1.00–4.80)
LYMPHOCYTE %: 21 %
MCH: 28.4 pg (ref 27.4–33.0)
MCHC: 33.4 g/dL (ref 32.5–35.8)
MCV: 85.2 fL (ref 78.0–100.0)
MONOCYTE #: 1.26 x10ˆ3/uL — ABNORMAL HIGH (ref 0.30–1.00)
MONOCYTE %: 17 %
MPV: 8.6 fL (ref 7.5–11.5)
NEUTROPHIL #: 4.3 x10ˆ3/uL (ref 1.50–7.70)
NEUTROPHIL %: 57 %
PLATELETS: 197 x10ˆ3/uL (ref 140–450)
RBC: 3.3 10*6/uL (ref 4.06–5.63)
RBC: 3.3 x10ˆ6/uL — ABNORMAL LOW (ref 4.06–5.63)
RDW: 15 % (ref 12.0–15.0)
WBC: 7.6 x10ˆ3/uL (ref 3.5–11.0)

## 2016-09-15 LAB — BUN: BUN: 19 mg/dL (ref 8–25)

## 2016-09-15 LAB — POC BLOOD GLUCOSE (RESULTS)
GLUCOSE, POC: 97 mg/dL (ref 70–105)
GLUCOSE, POC: 95 mg/dL (ref 70–105)
GLUCOSE, POC: 116 mg/dL — ABNORMAL HIGH (ref 70–105)

## 2016-09-15 LAB — CREATININE WITH EGFR
CREATININE: 0.94 mg/dL (ref 0.62–1.27)
ESTIMATED GFR: >59 mL/min/1.73mˆ2 (ref >59)

## 2016-09-15 LAB — IONIZED CALCIUM WITH PH
IONIZED CALCIUM: 1.11 mmol/L (ref 1.10–1.36)
PH (VENOUS): 7.46 — ABNORMAL HIGH (ref 7.31–7.41)

## 2016-09-15 LAB — MAGNESIUM: MAGNESIUM: 1.9 mg/dL (ref 1.6–2.5)

## 2016-09-15 MED ORDER — POTASSIUM CHLORIDE ER 20 MEQ TABLET,EXTENDED RELEASE(PART/CRYST)
40.00 meq | ORAL_TABLET | Freq: Three times a day (TID) | ORAL | Status: DC
Start: 2016-09-15 — End: 2016-09-16
  Administered 2016-09-15 (×2): 40 meq via ORAL
  Administered 2016-09-15: 0 meq via ORAL
  Administered 2016-09-16: 40 meq via ORAL
  Filled 2016-09-15 (×6): qty 2

## 2016-09-15 MED ORDER — MAGNESIUM SULFATE 4 GRAM/100 ML (4 %) IN WATER INTRAVENOUS PIGGYBACK
4.0000 g | INJECTION | Freq: Once | INTRAVENOUS | Status: AC
Start: 2016-09-16 — End: 2016-09-16
  Administered 2016-09-16: 4 g via INTRAVENOUS
  Administered 2016-09-16: 0 g via INTRAVENOUS
  Filled 2016-09-15: qty 100

## 2016-09-15 MED ORDER — FUROSEMIDE 10 MG/ML INJECTION SOLUTION
40.00 mg | Freq: Three times a day (TID) | INTRAMUSCULAR | Status: DC
Start: 2016-09-15 — End: 2016-09-16
  Administered 2016-09-15 – 2016-09-16 (×3): 40 mg via INTRAVENOUS
  Filled 2016-09-15 (×3): qty 4

## 2016-09-15 MED ORDER — SODIUM CHLORIDE 0.9 % INTRAVENOUS SOLUTION
1.0000 g | INJECTION | INTRAVENOUS | Status: AC | PRN
Start: 2016-09-15 — End: 2016-09-15
  Administered 2016-09-15: 0 g via INTRAVENOUS
  Administered 2016-09-15: 1 g via INTRAVENOUS
  Filled 2016-09-15: qty 10

## 2016-09-15 MED ORDER — POTASSIUM CHLORIDE ER 20 MEQ TABLET,EXTENDED RELEASE(PART/CRYST)
20.0000 meq | ORAL_TABLET | Freq: Once | ORAL | Status: AC | PRN
Start: 2016-09-15 — End: 2016-09-15
  Administered 2016-09-15: 20 meq via ORAL
  Filled 2016-09-15: qty 1

## 2016-09-15 MED ADMIN — oxyCODONE 5 mg tablet: ORAL | @ 18:00:00

## 2016-09-15 MED ADMIN — nicotine 21 mg/24 hr daily transdermal patch: ORAL | @ 20:00:00

## 2016-09-15 MED ADMIN — Medication: ORAL | @ 10:00:00

## 2016-09-15 MED ADMIN — sodium chloride 0.9 % (flush) injection syringe: ORAL | @ 10:00:00

## 2016-09-15 MED ADMIN — SODIUM CHLORIDE 0.9 % W/ ADDITIVES: SUBCUTANEOUS | @ 15:00:00 | NDC 00338004904

## 2016-09-15 NOTE — Nurses Notes (Signed)
Patient admitted to 9se 14 from CVICU

## 2016-09-15 NOTE — Care Plan (Signed)
Problem: Patient Care Overview (Adult,OB)  Goal: Plan of Care Review(Adult,OB)  The patient and/or their representative will communicate an understanding of their plan of care   Outcome: Ongoing (see interventions/notes)  VSS. Patient ambulated with assist of staff. Remained up in chair throughout shift. Complained of intermittent incisional pain managed with PRN oxy. Remains on sitter select, remains fall free. Weaned to room air, oxygen saturations maintained greater than 90%. Dressings remain intact, plan for discharge tomorrow.   Goal: Individualization/Patient Specific Goal(Adult/OB)  Outcome: Ongoing (see interventions/notes)    Goal: Interdisciplinary Rounds/Family Conf  Outcome: Ongoing (see interventions/notes)      Problem: Fall Risk (Adult)  Goal: Absence of Falls  Patient will demonstrate the desired outcomes by discharge/transition of care.   Outcome: Ongoing (see interventions/notes)      Problem: Skin Injury Risk (Adult,Obstetrics,Pediatric)  Goal: Skin Health and Integrity  Patient will demonstrate the desired outcomes by discharge/transition of care.   Outcome: Ongoing (see interventions/notes)      Problem: NPPV/CPAP (Adult)  Prevent and manage potential problems including:  1. conjunctivitis  2. dry mucous membranes  3. gastric distension leading to aspiration  4. hypoxia/hypoxemia  5. situational response  6. skin breakdown   Goal: Signs and Symptoms of Listed Potential Problems Will be Absent, Minimized or Managed (NPPV/CPAP)  Signs and symptoms of listed potential problems will be absent, minimized or managed by discharge/transition of care (reference NPPV/CPAP (Adult) CPG).   Outcome: Ongoing (see interventions/notes)

## 2016-09-15 NOTE — Care Management Notes (Signed)
[facility]  Care [facility]    Patient Name: [name] [name]  Date of Birth:   Sex: male  Date/Time of Admission: 09/06/2016  7:27 PM  Room/Bed: 14/A  Payor: BLUE CROSS BLUE SHIELD / Plan: [place] BC/BS PPO / Product Type: PPO /    LOS: 6 days   PCP: Randol Kernarrie Leach, PA-C    Admitting Diagnosis:  Unstable angina (HCC) [I20.0]  Unstable angina (HCC) [I20.0]  Unstable angina (HCC) [I20.0]    Assessment:      09/15/16 1020   Assessment Details   Assessment Type Continued Assessment   Date of Care Management Update 09/15/16   Date of Next DCP Update 09/16/16   Care Management Plan   Discharge Planning Status plan in progress   Projected Discharge Date 09/16/16   CM will evaluate for rehabilitation potential yes   Patient choice offered to patient/family yes   Form for patient choice reviewed/signed and on chart yes   [facility] or [facility]   TBR move to step down.    Discharge Plan:  Home with Home Health (code 6)  [facility]     The patient will continue to be evaluated for developing discharge needs.     Case Manager: Charlann Langearrie O'Neil, SOCIAL WORKER  Phone: [phone]

## 2016-09-15 NOTE — Care Plan (Signed)
Problem: Patient Care Overview (Adult,OB)  Goal: Plan of Care Review(Adult,OB)  The patient and/or their representative will communicate an understanding of their plan of care   Outcome: Ongoing (see interventions/notes)  Patient only wore his CPAP for a short time. He could not tolerate the mask. He was returned to a 3L NC and said that he would bring his own mask in to wear.

## 2016-09-15 NOTE — Nurses Notes (Signed)
Transferred to 9SE 14 via bed. Monitor ,oxygen on,pt. Alert and oriented x 4. Accompanied by two RNs. Transfer without incident.

## 2016-09-15 NOTE — Nurses Notes (Signed)
Patient intermittently desaturating into low 80's, patient will increase saturation upon awakening and return to 90%+ on RA. Monitor alarmed vtach, per telly tech could possibly be artifact. Service called and 12 lead ordered along with Mag replacement for Mag level of 1.9.  Respiratory also called to d/c home CPAP and begin hospital ordered CPAP. Will continue to monitor patient response, vitals, and labs.

## 2016-09-15 NOTE — Progress Notes (Signed)
[facility]  CARDIAC SURGERY FLOOR/STEPDOWN PROGRESS NOTE      [name] [name],[name] [name]  Date of Admission:  09/06/2016  Date of Birth:      Hospital Day:  LOS: 6 days   Post-op Day:  5 Days Post-Op, S/P re-exploration x 2, sternotomy; POD #4 Robotic assisted midCABG x 1  Date of Service:  09/15/2016    SUBJECTIVE: [name] [name] was transferred to step down status yesterday.  No acute events overnight.  Patient states he feels very well this AM.  He is ambulating without difficulty.  Appetite good.  +BM.  He states he is ready to go home.  Oxygen saturations 97% on 3L NC this AM.    OBJECTIVE:    Vital Signs:  Temp (24hrs) Max:37.1 C (98.8 F)      Temperature: 36.5 C (97.7 F) (09/15/16 0700)  BP (Non-Invasive): 118/86 (09/15/16 0800)  MAP (Non-Invasive): 95 mmHG (09/15/16 0800)  Heart Rate: 97 (09/15/16 0800)  Respiratory Rate: 16 (09/15/16 0800)  Pain Score (Numeric, Faces): 3 (09/15/16 0800)  SpO2-1: 97 % (09/15/16 0900)    Base (Admission) Weight:  Base Weight (ADM): 120.7 kg (266 lb 1.5 oz)  Weight:  Weight: 120.4 kg (265 lb 6.9 oz)    Current Inpatient Medications:    Current Facility-Administered Medications:  acetaminophen (TYLENOL) tablet 650 mg Oral Q4H PRN   aspirin chewable tablet 81 mg 81 mg Oral Daily   atorvastatin (LIPITOR) tablet 80 mg Oral QPM   bisacodyl (DULCOLAX) rectal suppository 10 mg Rectal Daily PRN   budesonide (PULMICORT RESPULES) 0.5 mg/2 mL nebulizer suspension 0.5 mg Nebulization 2x/day   chlorhexidine gluconate (PERIDEX) 0.12% mouthwash 15 mL Topical 2x/day   clopidogrel (PLAVIX) 75 mg tablet 75 mg Oral Daily   docusate sodium (COLACE) capsule 100 mg Oral 2x/day   fluticasone (FLONASE) 50 mcg per spray nasal spray 1 Spray Each Nostril Daily   furosemide (LASIX) 10 mg/mL injection 40 mg Intravenous Q8HRS   heparin 5,000 unit/mL injection 5,000 Units Subcutaneous Q8HRS   ipratropium (ATROVENT) 0.02% nebulizer solution 0.5 mg Nebulization Q6H PRN   levalbuterol (XOPENEX) 0.63 mg/ 3  mL nebulizer solution 0.63 mg Nebulization Q8H PRN   magnesium hydroxide (MILK OF MAGNESIA) 400mg  per 5mL oral liquid 30 mL Oral Daily PRN   melatonin tablet 3 mg Oral HS PRN   metoprolol (LOPRESSOR) tablet 75 mg 75 mg Oral Q12H   montelukast (SINGULAIR) 10 mg tablet 10 mg Oral QPM   NS flush syringe 2 mL Intracatheter Q8HRS   And      NS flush syringe 2-6 mL Intracatheter Q1 MIN PRN   NS flush syringe 2 mL Intracatheter Q8HRS   And      NS flush syringe 2-6 mL Intracatheter Q1 MIN PRN   ondansetron (ZOFRAN) 2 mg/mL injection 4 mg Intravenous Q8H PRN   oxyCODONE (ROXICODONE) immediate release tablet 5 mg Oral Q4H PRN   oxyCODONE (ROXICODONE) immediate release tablet 10 mg Oral Q4H PRN   pantoprazole (PROTONIX) delayed release tablet 40 mg Oral Daily   potassium chloride (K-DUR) extended release tablet 20 mEq Oral Once PRN   potassium chloride (K-DUR) extended release tablet 40 mEq Oral 3x/day-Meals   sennosides-docusate sodium (SENOKOT-S) 8.6-50mg  per tablet 1 Tab Oral 2x/day   simethicone (MYLICON) chewable tablet 80 mg Oral 4x/day PRN   SSIP insulin lispro (HUMALOG) 100 units/mL injection 2-6 Units Subcutaneous 4x/day PRN       Appropriate Home Meds restarted:  Yes    I/O:  I/O last 24 hours to current time:    Intake/Output Summary (Last 24 hours) at 09/15/16 0929  Last data filed at 09/15/16 0400   Gross per 24 hour   Intake              634 ml   Output              775 ml   Net             -141 ml     I/O last 3 completed shifts:  In: 934 [P.O.:900; I.V.:34]  Out: 775 [Urine:775]    Output 24 Hours 8 Hours   Urine  775    Chest Tube #1     Chest Tube #2     Chest Tube #3     NG                 Nutrition/Diet:  ROOM SERVICE:  SEND AUTOMATIC HOUSE TRAY  MNT PROTOCOL FOR DIETITIAN  DIET CARDIAC (4G NA,LOWFAT,LOWCHOL)    Labs:  Reviewed:    Lab Results Today:    Results for orders placed or performed during the hospital encounter of 09/06/16 (from the past 24 hour(s))   POC BLOOD GLUCOSE (RESULTS)   Result Value  Ref Range    GLUCOSE, POC 103 70 - 105 mg/dL   POC BLOOD GLUCOSE (RESULTS)   Result Value Ref Range    GLUCOSE, POC 107 (H) 70 - 105 mg/dL   ELECTROLYTES   Result Value Ref Range    SODIUM 141 136 - 145 mmol/L    POTASSIUM 3.6 3.5 - 5.1 mmol/L    CHLORIDE 99 96 - 111 mmol/L    CO2 TOTAL 33 (H) 22 - 32 mmol/L    ANION GAP 9 4 - 13 mmol/L   BUN   Result Value Ref Range    BUN 19 8 - 25 mg/dL   CREATININE   Result Value Ref Range    CREATININE 0.94 0.62 - 1.27 mg/dL    ESTIMATED GFR >16>59 >10>59 mL/min/1.239m^2   MAGNESIUM   Result Value Ref Range    MAGNESIUM 1.9 1.6 - 2.5 mg/dL   PT/INR   Result Value Ref Range    PROTHROMBIN TIME 11.5 9.3 - 13.9 seconds    INR 0.99 0.80 - 1.20   IONIZED CALCIUM   Result Value Ref Range    PH (VENOUS) 7.46 (H) 7.31 - 7.41    IONIZED CALCIUM 1.11 1.10 - 1.36 mmol/L   CBC WITH DIFF   Result Value Ref Range    WBC 7.6 3.5 - 11.0 x10^3/uL    RBC 3.30 (L) 4.06 - 5.63 x10^6/uL    HGB 9.4 (L) 12.5 - 16.3 g/dL    HCT 96.028.1 (L) 45.436.7 - 47.0 %    MCV 85.2 78.0 - 100.0 fL    MCH 28.4 27.4 - 33.0 pg    MCHC 33.4 32.5 - 35.8 g/dL    RDW 09.815.0 11.912.0 - 14.715.0 %    PLATELETS 197 140 - 450 x10^3/uL    MPV 8.6 7.5 - 11.5 fL    NEUTROPHIL % 57 %    LYMPHOCYTE % 21 %    MONOCYTE % 17 %    EOSINOPHIL % 5 %    BASOPHIL % 1 %    NEUTROPHIL # 4.30 1.50 - 7.70 x10^3/uL    LYMPHOCYTE # 1.58 1.00 - 4.80 x10^3/uL    MONOCYTE # 1.26 (H) 0.30 - 1.00 x10^3/uL  EOSINOPHIL # 0.39 0.00 - 0.50 x10^3/uL    BASOPHIL # 0.04 0.00 - 0.20 x10^3/uL   POC BLOOD GLUCOSE (RESULTS)   Result Value Ref Range    GLUCOSE, POC 97 70 - 105 mg/dL     Ordered:  CBC, BMP in AM  Anticoagulation:  No    Radiology:    Reviewed:  CXR:  Direct visualization of the image on 09/15/2016 on [facility] PACS showed  stable left basilar haziness, sternal wires intact   Ordered:  CXR in AM    Physical Exam:    Constitutional:  no distress  Respiratory:  decreased breath sounds left base  Cardiovascular:  regular rate and rhythm  Gastrointestinal:  Soft, non-tender,  non-distended  Musculoskeletal:  Head atraumatic and normocephalic  Integumentary:  Skin warm and dry, No rashes and sternotomy C/d/I, left mini thoracotomy C/D/I  Neurologic:  Grossly normal  Psychiatric:  Affect Normal    ASSESSMENT/PLAN:  1. S/P Robotic assisted mid CABG x 1  2. S/P return to OR x 2 with sternotomy for postoperative bleeding  3. Diuresis - increase lasix to Q8 hours today  4. Wean oxygen to RA as tolerated - oxygen challenge today  5. CBC, BMP, CXR in AM  6. Encourage ambulation and IS  7. Disposition - home with home health once weaned off of oxygen    Problem List:  Active Hospital Problems    Diagnosis   . Hypokalemia   . Unstable angina (HCC)       PT/OT: No    DVT RISK FACTORS HAVE BEN ASSESSED AND PROPHYLAXIS ORDERED (SEE RUBYONLINE - REFERENCE TOOLS - MD, DVT PROPHY OR POCKET CARD):  YES    Disposition Planning: Home discharge  and Home Health     Florence Canner, PA-C

## 2016-09-15 NOTE — Nurses Notes (Signed)
09/15/16 1405       Home Oxygen Assessment   O2 sat on Room Air at Rest 97   O2 sat on Oxygen at rest 99   O2 sat on Room Air while Ambulating 92   o2 sat on NC while Ambulating 99   Oxygen Amount (LPM) 3     Oxygen challenge completed, results above. Patient passed.

## 2016-09-15 NOTE — Ancillary Notes (Signed)
Visited patient to educate for cardiac rehabilitation. Reviewed Phase II Cardiac Rehab,  risk factors, diet, exercise, limitations, and hygiene.     Education completed with patient, no questions or concerns at this time.    Federick Levene BS, ES  [facility] Phase I  #[id number]

## 2016-09-16 ENCOUNTER — Inpatient Hospital Stay (HOSPITAL_COMMUNITY): Payer: BC Managed Care – PPO

## 2016-09-16 ENCOUNTER — Other Ambulatory Visit (HOSPITAL_COMMUNITY): Payer: Self-pay | Admitting: Specialist

## 2016-09-16 DIAGNOSIS — I517 Cardiomegaly: Secondary | ICD-10-CM

## 2016-09-16 DIAGNOSIS — J90 Pleural effusion, not elsewhere classified: Secondary | ICD-10-CM

## 2016-09-16 DIAGNOSIS — J9811 Atelectasis: Secondary | ICD-10-CM

## 2016-09-16 DIAGNOSIS — R358 Other polyuria: Secondary | ICD-10-CM

## 2016-09-16 DIAGNOSIS — R3589 Other polyuria: Secondary | ICD-10-CM

## 2016-09-16 LAB — ELECTROLYTES
ANION GAP: 8 mmol/L (ref 4–13)
CHLORIDE: 98 mmol/L (ref 96–111)
CO2 TOTAL: 31 mmol/L (ref 22–32)
POTASSIUM: 3.8 mmol/L (ref 3.5–5.1)
SODIUM: 137 mmol/L (ref 136–145)

## 2016-09-16 LAB — MAGNESIUM: MAGNESIUM: 3.3 mg/dL — ABNORMAL HIGH (ref 1.6–2.5)

## 2016-09-16 LAB — CBC WITH DIFF
BASOPHIL #: 0.08 x10ˆ3/uL (ref 0.00–0.20)
BASOPHIL %: 1 %
BASOPHIL %: 1 %
EOSINOPHIL #: 0.43 x10ˆ3/uL (ref 0.00–0.50)
EOSINOPHIL %: 5 %
HCT: 32.1 % — ABNORMAL LOW (ref 36.7–47.0)
HGB: 11.1 g/dL — ABNORMAL LOW (ref 12.5–16.3)
LYMPHOCYTE #: 1.89 x10ˆ3/uL (ref 1.00–4.80)
LYMPHOCYTE %: 22 %
MCH: 29.4 pg (ref 27.4–33.0)
MCHC: 34.6 g/dL (ref 32.5–35.8)
MCV: 84.9 fL (ref 78.0–100.0)
MONOCYTE #: 1.01 x10ˆ3/uL — ABNORMAL HIGH (ref 0.30–1.00)
MONOCYTE %: 12 %
MPV: 9 fL (ref 7.5–11.5)
NEUTROPHIL #: 5.3 x10ˆ3/uL (ref 1.50–7.70)
NEUTROPHIL %: 61 %
PLATELETS: 244 x10ˆ3/uL (ref 140–450)
RBC: 3.78 x10?6/uL — ABNORMAL LOW (ref 4.06–5.63)
RDW: 15.2 % — ABNORMAL HIGH (ref 12.0–15.0)
WBC: 8.7 x10ˆ3/uL (ref 3.5–11.0)

## 2016-09-16 LAB — ECG 12-LEAD
Atrial Rate: 93 {beats}/min
Calculated P Axis: 21 degrees
Calculated R Axis: 78 degrees
Calculated T Axis: 19 degrees
Calculated T Axis: 19 degrees
PR Interval: 164 ms
QRS Duration: 98 ms
QT Interval: 372 ms
QTC Calculation: 462 ms
Ventricular rate: 93 {beats}/min

## 2016-09-16 LAB — POC BLOOD GLUCOSE (RESULTS)
GLUCOSE, POC: 130 mg/dL — ABNORMAL HIGH (ref 70–105)
GLUCOSE, POC: 116 mg/dL — ABNORMAL HIGH (ref 70–105)

## 2016-09-16 LAB — CREATININE WITH EGFR
CREATININE: 0.94 mg/dL (ref 0.62–1.27)
ESTIMATED GFR: >59 mL/min/1.73m?2 (ref >59)

## 2016-09-16 LAB — BUN: BUN: 17 mg/dL (ref 8–25)

## 2016-09-16 MED ORDER — ASPIRIN 81 MG CHEWABLE TABLET
81.0000 mg | CHEWABLE_TABLET | Freq: Every day | ORAL | 2 refills | Status: DC
Start: 2016-09-16 — End: 2017-06-19

## 2016-09-16 MED ORDER — SENNOSIDES 8.6 MG-DOCUSATE SODIUM 50 MG TABLET: 1 | Tab | ORAL | 0 refills | 0 days | Status: DC | PRN

## 2016-09-16 MED ORDER — POTASSIUM CHLORIDE ER 20 MEQ TABLET,EXTENDED RELEASE(PART/CRYST): Tab | ORAL | 0 refills | 0 days | Status: DC

## 2016-09-16 MED ORDER — OXYCODONE 5 MG TABLET: 5 mg | Tab | Freq: Four times a day (QID) | ORAL | 0 refills | 0 days | Status: DC | PRN

## 2016-09-16 MED ORDER — METOPROLOL TARTRATE 75 MG TABLET
75.00 mg | ORAL_TABLET | Freq: Two times a day (BID) | ORAL | 2 refills | Status: DC
Start: 2016-09-16 — End: 2016-10-13

## 2016-09-16 MED ORDER — MELATONIN 3 MG TABLET: 3 mg | Tab | Freq: Every evening | ORAL | 0 refills | 0 days | Status: AC | PRN

## 2016-09-16 MED ORDER — FUROSEMIDE 40 MG TABLET
ORAL_TABLET | ORAL | 0 refills | Status: DC
Start: 2016-09-16 — End: 2016-10-13

## 2016-09-16 MED ADMIN — albumin, human 5 % intravenous solution: ORAL | @ 08:00:00

## 2016-09-16 MED ADMIN — metoclopramide 5 mg/mL injection solution: INTRAVENOUS | @ 04:00:00

## 2016-09-16 NOTE — Nurses Notes (Signed)
Lab results reviewed, patients magnesium up to 3.3 from 1.9 after 4G Mag in SW, service notified of result, no orders obtained at this time.

## 2016-09-16 NOTE — Discharge Instructions (Signed)
Discharge Recommendations/ Plan:Discharge UE:AVWU with Home Health (code 6)     Pt to discharge to home with [facility]. Please call nsg report to [phone]      Resources: (copy and paste info here)

## 2016-09-16 NOTE — Progress Notes (Signed)
[facility]  CARDIAC SURGERY FLOOR/STEPDOWN PROGRESS NOTE      Mccaul,Aragorn A  Date of Admission:  09/06/2016  Date of Birth:      Hospital Day:  LOS: 7 days   Post-op Day:  6 Days Post-Op, Robotic assisted midCABG x 1; POD #5 S/P re-exploration x 2, sternotomy  Date of Service:  09/16/2016    SUBJECTIVE: [name] has no new complaints today.  He states he is ready for discharge home.  Patient passed oxygen challenge for discharge yesterday.  No acute events overnight.  Oxygen saturations 96% on RA this AM.    OBJECTIVE:    Vital Signs:  Temp (24hrs) Max:37.2 C (99 F)      Temperature: 36.7 C (98 F) (09/16/16 0801)  BP (Non-Invasive): 126/78 (09/16/16 0801)  MAP (Non-Invasive): 93 mmHG (09/16/16 0801)  Heart Rate: (!) 105 (09/16/16 0801)  Respiratory Rate: 16 (09/16/16 0801)  Pain Score (Numeric, Faces): 0 (09/16/16 0912)  SpO2-1: 93 % (09/16/16 0801)    Base (Admission) Weight:  Base Weight (ADM): 120.7 kg (266 lb 1.5 oz)  Weight:  Weight: 120.1 kg (264 lb 12.4 oz)    Current Inpatient Medications:    Current Facility-Administered Medications:  acetaminophen (TYLENOL) tablet 650 mg Oral Q4H PRN   aspirin chewable tablet 81 mg 81 mg Oral Daily   atorvastatin (LIPITOR) tablet 80 mg Oral QPM   bisacodyl (DULCOLAX) rectal suppository 10 mg Rectal Daily PRN   budesonide (PULMICORT RESPULES) 0.5 mg/2 mL nebulizer suspension 0.5 mg Nebulization 2x/day   chlorhexidine gluconate (PERIDEX) 0.12% mouthwash 15 mL Topical 2x/day   clopidogrel (PLAVIX) 75 mg tablet 75 mg Oral Daily   docusate sodium (COLACE) capsule 100 mg Oral 2x/day   fluticasone (FLONASE) 50 mcg per spray nasal spray 1 Spray Each Nostril Daily   furosemide (LASIX) 10 mg/mL injection 40 mg Intravenous Q8HRS   heparin 5,000 unit/mL injection 5,000 Units Subcutaneous Q8HRS   ipratropium (ATROVENT) 0.02% nebulizer solution 0.5 mg Nebulization Q6H PRN   levalbuterol (XOPENEX) 0.63 mg/ 3 mL nebulizer solution 0.63 mg Nebulization Q8H PRN    magnesium hydroxide (MILK OF MAGNESIA)  per 5mL oral liquid 30 mL Oral Daily PRN   melatonin tablet 3 mg Oral HS PRN   metoprolol (LOPRESSOR) tablet 75 mg 75 mg Oral Q12H   montelukast (SINGULAIR) 10 mg tablet 10 mg Oral QPM   NS flush syringe 2 mL Intracatheter Q8HRS   And      NS flush syringe 2-6 mL Intracatheter Q1 MIN PRN   ondansetron (ZOFRAN) 2 mg/mL injection 4 mg Intravenous Q8H PRN   oxyCODONE (ROXICODONE) immediate release tablet 5 mg Oral Q4H PRN   oxyCODONE (ROXICODONE) immediate release tablet 10 mg Oral Q4H PRN   pantoprazole (PROTONIX) delayed release tablet 40 mg Oral Daily   potassium chloride (K-DUR) extended release tablet 20 mEq Oral Once PRN   potassium chloride (K-DUR) extended release tablet 40 mEq Oral 3x/day-Meals   sennosides-docusate sodium (SENOKOT-S) 8.6-50mg  per tablet 1 Tab Oral 2x/day   simethicone (MYLICON) chewable tablet 80 mg Oral 4x/day PRN   SSIP insulin lispro (HUMALOG) 100 units/mL injection 2-6 Units Subcutaneous 4x/day PRN       Appropriate Home Meds restarted:  Yes    I/O:  I/O last 24 hours to current time:      Intake/Output Summary (Last 24 hours) at 09/16/16 1037  Last data filed at 09/16/16 1000   Gross per 24 hour   Intake  1720 ml   Output             3325 ml   Net            -1605 ml     I/O last 3 completed shifts:  In: 1360 [P.O.:1220; I.V.:140]  Out: 3325 [Urine:3325]    Output 24 Hours 8 Hours   Urine  2625    Chest Tube #1     Chest Tube #2     Chest Tube #3     NG                 Nutrition/Diet:  ROOM SERVICE:  SEND AUTOMATIC HOUSE TRAY  MNT PROTOCOL FOR DIETITIAN  DIET CARDIAC (4G NA,LOWFAT,LOWCHOL)    Labs:  Reviewed:    Lab Results Today:    Results for orders placed or performed during the hospital encounter of 09/06/16 (from the past 24 hour(s))   POC BLOOD GLUCOSE (RESULTS)   Result Value Ref Range    GLUCOSE, POC 95 70 - 105 mg/dL   POC BLOOD GLUCOSE (RESULTS)   Result Value Ref Range    GLUCOSE, POC 130 (H) 70 - 105 mg/dL   POC BLOOD  GLUCOSE (RESULTS)   Result Value Ref Range    GLUCOSE, POC 116 (H) 70 - 105 mg/dL   ELECTROLYTES   Result Value Ref Range    SODIUM 137 136 - 145 mmol/L    POTASSIUM 3.8 3.5 - 5.1 mmol/L    CHLORIDE 98 96 - 111 mmol/L    CO2 TOTAL 31 22 - 32 mmol/L    ANION GAP 8 4 - 13 mmol/L   BUN   Result Value Ref Range    BUN 17 8 - 25 mg/dL   CREATININE   Result Value Ref Range    CREATININE 0.94 0.62 - 1.27 mg/dL    ESTIMATED GFR >16 >10 mL/min/1.54m^2   MAGNESIUM   Result Value Ref Range    MAGNESIUM 3.3 (H) 1.6 - 2.5 mg/dL   CBC WITH DIFF   Result Value Ref Range    WBC 8.7 3.5 - 11.0 x10^3/uL    RBC 3.78 (L) 4.06 - 5.63 x10^6/uL    HGB 11.1 (L) 12.5 - 16.3 g/dL    HCT 96.0 (L) 45.4 - 47.0 %    MCV 84.9 78.0 - 100.0 fL    MCH 29.4 27.4 - 33.0 pg    MCHC 34.6 32.5 - 35.8 g/dL    RDW 09.8 (H) 11.9 - 15.0 %    PLATELETS 244 140 - 450 x10^3/uL    MPV 9.0 7.5 - 11.5 fL    NEUTROPHIL % 61 %    LYMPHOCYTE % 22 %    MONOCYTE % 12 %    EOSINOPHIL % 5 %    BASOPHIL % 1 %    NEUTROPHIL # 5.30 1.50 - 7.70 x10^3/uL    LYMPHOCYTE # 1.89 1.00 - 4.80 x10^3/uL    MONOCYTE # 1.01 (H) 0.30 - 1.00 x10^3/uL    EOSINOPHIL # 0.43 0.00 - 0.50 x10^3/uL    BASOPHIL # 0.08 0.00 - 0.20 x10^3/uL   POC BLOOD GLUCOSE (RESULTS)   Result Value Ref Range    GLUCOSE, POC 116 (H) 70 - 105 mg/dL     Anticoagulation:  No    Radiology:    Reviewed:  CXR:  Direct visualization of the image on 09/16/2016 on [facility] PACS showed  stable left basilar haziness, sternal wires intact  Physical Exam:    Constitutional:  no distress  Respiratory:  decreased breath sounds left base  Cardiovascular:  regular rate and rhythm  Gastrointestinal:  Soft, non-tender, non-distended  Musculoskeletal:  Head atraumatic and normocephalic  Integumentary:  Skin warm and dry, No rashes and sternotomy C/d/I, left mini thoracotomy C/D/I  Neurologic:  Grossly normal  Psychiatric:  Affect Normal    ASSESSMENT/PLAN:  1. S/P Robotic assisted mid CABG x 1  2. S/P return to OR x 2 with sternotomy  for postoperative bleeding  3. Oxygen challenge passed - no home oxygen for discharge  4. Disposition - home with home health today    Problem List:  Active Hospital Problems    Diagnosis   . Hypokalemia   . Unstable angina (HCC)       PT/OT: No    DVT RISK FACTORS HAVE BEN ASSESSED AND PROPHYLAXIS ORDERED (SEE RUBYONLINE - REFERENCE TOOLS - MD, DVT PROPHY OR POCKET CARD):  YES    Disposition Planning: Home discharge  and Home Health     Florence Canner, PA-C

## 2016-09-16 NOTE — Nurses Notes (Signed)
Patient given discharge instructions as directed by MD and handouts on all new medications. Patient and family member verbalize understanding of all discharge instructions and follow up appointments. No questions at this time from patient. Patient stable at time of discharge. Peripheral IV removed and catheter intact. D/C home with wife.

## 2016-09-16 NOTE — Care Management Notes (Signed)
Spoke with Myriam Jacobson at [facility] and they are agreeable to taking Pt on service. Notified bedside nsg Maralyn Sago of status and number for report in AVS. Signed HH sent via allascripts to [facility].

## 2016-09-16 NOTE — Care Plan (Addendum)
Problem: Patient Care Overview (Adult,OB)  Goal: Plan of Care Review(Adult,OB)  The patient and/or their representative will communicate an understanding of their plan of care   Outcome: Ongoing (see interventions/notes)  VSS. Patient ambulated with assist of staff. Remained up in chair until bedtime. Complained of intermittent incisional pain managed with PRN tylenol and PRN oxy. Sitter select in use to assist in remaining fall free, call light in reach and utilized for assistance. Patient on RA with oxygen saturations maintained greater than 90% until bed time, home CPAP utilized with oxygen saturations in mid-lod 80's, home CPAP d/c'd and 1L NC utilized to improve oxygenation to a saturation level of 95+ . Dressings remain intact with only slight drainaige, plan for discharge tomorrow.

## 2016-09-16 NOTE — Discharge Summary (Signed)
[facility]  DISCHARGE SUMMARY      PATIENT NAME:  [name] [name], [name] [name]  MRN:  [id number]  DOB:      ENCOUNTER DATE:  09/06/2016  INPATIENT ADMISSION DATE: 09/09/2016  DISCHARGE DATE:  09/16/2016    ATTENDING PHYSICIAN: Nell Range, MD  SERVICE: CARDIAC SURGERY  PRIMARY CARE PHYSICIAN: Biagio Quint, PA-C     Reason for Admission     Diagnosis        Unstable angina [facility]) [[id number]]    Unstable angina [facility]) [[id number]]    Unstable angina [facility]) [[id number]]          DISCHARGE DIAGNOSIS:     Principal Problem:      Active Hospital Problems    Diagnosis Date Noted   . Hypokalemia 09/07/2016   . Unstable angina ([place]) 09/06/2016      Resolved Hospital Problems    Diagnosis    No resolved problems to display.     Active Non-Hospital Problems    Diagnosis Date Noted   . Chest pain 04/22/2016   . CAD (coronary artery disease) 01/25/2013      Allergies   Allergen Reactions   . Phenergan [Promethazine] Seizure            DISCHARGE MEDICATIONS:     Current Discharge Medication List      START taking these medications.       Details    aspirin 81 mg Tablet, Chewable    81 mg, Oral, Daily   Qty:  60 Tab   Refills:  2       furosemide 40 mg Tablet   Commonly known as:  LASIX    Take 40 mg twice daily for 7 days then 40 mg daily   Qty:  30 Tab   Refills:  0       melatonin 3 mg Tablet    3 mg, Oral, HS PRN   Qty:  30 Tab   Refills:  0       oxyCODONE 5 mg Tablet   Commonly known as:  ROXICODONE    5 mg, Oral, Q6H PRN   Qty:  50 Tab   Refills:  0       potassium chloride 20 mEq Tab Sust.Rel. Particle/Crystal   Commonly known as:  K-DUR    Take 40 meq PO twice daily for 7 days then 40 meq daily   Qty:  42 Tab   Refills:  0       sennosides-docusate sodium 8.6-50 mg Tablet   Commonly known as:  SENOKOT-S    1 Tab, Oral, Q24 H PRN   Qty:  30 Tab   Refills:  0         CONTINUE these medications which have CHANGED during your visit.       Details    metoprolol tartrate 75 mg Tablet   Commonly known as:  LOPRESSOR   What changed:    -  medication strength  - how much to take  - when to take this    75 mg, Oral, Q12H   Qty:  60 Tab   Refills:  2         CONTINUE these medications - NO CHANGES were made during your visit.       Details    atorvastatin 20 mg Tablet   Commonly known as:  LIPITOR    20 mg, Oral, QPM   Qty:  90 Tab   Refills:  3  clopidogrel 75 mg Tablet   Commonly known as:  PLAVIX    75 mg, Oral, Daily   Qty:  90 Tab   Refills:  1       fluticasone 50 mcg/actuation Spray, Suspension   Commonly known as:  FLONASE    1 Spray, Each Nostril, Daily   Refills:  0       fluticasone-vilanterol 100-25 mcg/dose Disk with Device   Commonly known as:  BREO ELLIPTA    1 INHALATION, Inhalation, Daily   Refills:  0       ipratropium-albuterol 0.5 mg-3 mg(2.5 mg base)/3 mL Solution for Nebulization   Commonly known as:  DUONEB    3 mL, Nebulization, 4x/day   Refills:  0       montelukast 10 mg Tablet   Commonly known as:  SINGULAIR    10 mg, Oral, QPM   Refills:  0       nitroGLYCERIN 0.4 mg Tablet, Sublingual   Commonly known as:  NITROSTAT    0.4 mg, Q5 Min PRN   Refills:  0       PROAIR HFA INHL    1-2 Puffs, Inhalation, Q6H PRN   Refills:  0       raNITIdine 150 mg Tablet   Commonly known as:  ZANTAC    150 mg, Oral, 2x/day   Refills:  0         STOP taking these medications.          amLODIPine 5 mg Tablet   Commonly known as:  NORVASC       losartan 50 mg Tablet   Commonly known as:  COZAAR           Discharge med list refreshed?  YES    During this hospitalization did the patient have an AMI, PCI/PCTA, STENT or Isolated CABG?  Yes  and is being discharged on the following regimen                ASA:  Yes  Beta Blocker:  Yes  ACEI/ARB:  No, EF > or = to 40%  Statin:  Yes  Antiplatelets (Plavix, Brilinta, Effient): Yes  Spironolactone Indicated (Heart Failure): No:  Other NA                              DISCHARGE INSTRUCTIONS:  Follow-up Information     Follow up with [facility] .    Specialty:  Cardiac Surgery     Contact information:    [address]  [place] [address]  [phone]    Additional information:    For driving directions to the [facility] in [place], [place], please call [phone] [phone]). You may also visit our website at [email or web address].*Valet parking is available to patients at [facility] for free and tipping is not required. *Visitors to our main campus will Location manager as we are expanding to better serve you. We apologize for any inconvenience this may cause and appreciate your patience.        Follow up with [facility] .    Specialty:  Cardiology    Contact information:    [place] [address]  [phone]    Additional information:    For driving directions to the [facility] in [place], [place], please call  [phone]). You may also visit our website at [email or web address].*Valet parking is available to patients at [facility] for free and tipping is not required. *Visitors to our main campus will Location manager as we are expanding to better serve you. We apologize for any inconvenience this may cause and appreciate your patience.          XR CHEST PA AND LATERAL   Please schedule with return to [facility] clinic visit.   Ordering Provider Robert Sperl A [[id number]]      AMB CONSULT/REFERRAL FINDING WELLNESS INITIATIVE     DISCHARGE INSTRUCTION - ACTIVITY   We suggest a cautious, yet progressive plan to safely regain your normal body and life functions. In time, you should be able to do your regular routine tasks, return to work, and take part in recreational activities. Here are a few tips:  Get up and get dressed each morning. Please do not stay in bed. Do wear comfortable clothes each day, break up long tasks into shorter parts, and space them over the day and stop your tasks before you get tired,  if you do too much, you will likely be tired the next day and need to rest, which will slow recovery, balance your activity with rest times. Your body may give you signals to rest, these include symptoms such as shortness of breath, fatigue, dizziness, and pain, rest when possible or needed. If you need to climb stairs, we suggest going slowly at first. Always remember that it takes more energy to climb a flight of steps then to walk on a level surface. If you start to become tired or have shortness of breath, stop, rest for a few minutes,   and   then continue. You should only use a railing for your balance, please do not pull yourself up the stairs as the strain can damage your incision and put excess pressure on your body.   Activity: GRADUALLY INCREASE ACTIVITY AS TOLERATED    Activity: NO LIFTING OVER 10 POUNDS FOR 2 WEEKS    Activity: NO VIGOROUS/STRENUOUS ACTIVITY      DISCHARGE INSTRUCTION - DRIVING   You will not be cleared to safely drive a car until after your visit with your surgeon, this visit is often scheduled four weeks after your surgery. No driving while taking Narcotic (pain) medications.  If you were to be involved in a car accident, you would hurt your sternum(breast bone) or other areas that have undergone surgical procedures. When you are a passenger in a car, we suggest you ride in the back seat, if possible. If you are riding in the front seat, move as far back as possible to increase leg room, and use a pillow between your chest and the seat belt for added comfort and to avoid any irritation on your incision. We advise against taking long trips without a doctors approval, and when approved be sure to allow ample time for stops to walk and stretch your legs and arms.     DISCHARGE INSTRUCTION - INCISION/WOUND CARE   Please follow these steps to care for your incision(s): breathe in through your nose as you raise your arms during activity, breathe out through your mouth as you lower your  arms. Never hold your breath, you may raise your arms over your head to brush or shampoo your hair. Be careful when reaching. The breastbone and the muscles around it may be very sore, if this was the approach for your surgery, do not lift  anything heavier than 5 pounds for 4 weeks after surgery,   (if your incision is located on either side of your chest, you may not lift anything heavier than 10 pounds for 2 weeks after your surgery),  you should not push or pull with your arms, especially when rising from a bed, assistive devices, such as canes or walkers, can be used only for balance. Do not place your full weight on any of these devices until the incision is fully healed, it is very important to keep your incisions clean and dry. Follow these guidelines: shower daily, pat dry. Do not take a tub bath for 4   weeks or until your surgeon says you may. Wash your incisions with an antimicrobial soap and water. Always use a clean cloth. Do not put any creams, lotions, or antibiotic ointments on the incisions. Keep your legs raised when sitting for more than 15 minutes, and remember to stretch. Do not wear any tight clothing that may rub against your incisions, causing irritation.   Instructions for incision/wound care: Wound Care as Instructed      DISCHARGE INSTRUCTION - WHEN TO CONTACT THE SURGEONS OFFICE AT [phone] [facility] or [phone] - [place]ANI[place]   Call for:  Increased warmth in the skin around an incision, redness that spreads out more than one inch from incision edges, increase of swelling, tightness, or pain around an incision, large amount of clear or pinkish drainage, sudden increase in the amount of drainage, white, yellow, or green drainage with odor coming from an incision, fever higher than 101.0 F (38.3 C), chills or temperature of 99.0 F (37.2 C) to 100.9 F (38.27 C) for more than 3 days.  Continued or severe sadness, lightheadedness/dizziness, increased shortness of breath, burning when  passing urine, severe calf pain, new severe pain in your chest, heart rate greater than 110 beats per minute or less than 50 beats per minute.     DISCHARGE INSTRUCTION - REFER TO HEART SURGERY BOOKLET   You have been provided with the comprehensive Heart Surgery: Your Heart -> Our Focus booklet.  Please continue to refer to this during your recovery process.  It not only educates you on the discharge instructions provided to you today but also additional instructions for caring for yourself at home, heart healthy way of life, heart healthy food plan and other items to remember.     DISCHARGE INSTRUCTION - DIET -- Cardiac Diet   Diet: CARDIAC DIET      ECG 12-LEAD   Schedule with return to HVI clinic.   Reason for Exam S/P CARDIAC SURGERY      CARDIAC REHAB PHASE II   Individualized education will be given based on the patient's diagnosis and needs.   Indication for Therapy S/P CABG    Method to Determine THR for Program Duration TO BE DETERMINED BY THE CARDIAC REHAB STAFF    Exercise Intensity or MET Level TO BE DETERMINED BY CARDIAC REHAB STAFF      [place] ([facility])   Follow-up in: 3 WEEKS    Reason for visit: HOSPITAL DISCHARGE    Provider: Wei, [place]   Follow-up in: [place]    Reason for visit: HOSPITAL DISCHARGE    Provider: [place] COURSE:  This is a  51 y.o., male PMH CAD and previous stenting of his LAD and was found to have re stenosis and unstable angina. He was evaluated and taken to the OR on 09/09/16 for Minimally invasive robotic coronary bypass x1 LIMA>LAD. He tolerated the procedure well and was taken to the CVICU in stable condition. He was taken back to the OR for bleeding and a sternotomy was done. POD1 he was extubated and he was transfused with 2 units PRBCs. POD2he was placed on BiPAP overnight, his chest tubes were removed.  POD3 he was started on Plavix and he continued to improve. POD4 he was transferred to SDU. POD5 his diuresis was increased and he was weaned to room air. POD6 he was discharged to home in stable condition. He will RTC in 3 weeks with CXR, EKG.     CONDITION ON DISCHARGE:  A. Ambulation: Full ambulation  B. Self-care Ability: Complete  C. Cognitive Status Alert and Oriented x 3  D. DNR status at discharge: Full Code    Advance Directive Information       Most Recent Value    Does the Patient have an Advance Directive? No, Information Offered and Given          DISCHARGE DISPOSITION:  Home discharge              Mickie Bail, APRN,FNP-BC      Copies sent to Care Team       Relationship Specialty Notifications Start End    Biagio Quint, [place] PCP - General EXTERNAL  03/07/13     Phone: [phone] Fax: [phone]         [place] [address]          Referring providers can utilize [email or web address] to access their referred [place] patient's information.

## 2016-09-16 NOTE — Care Plan (Signed)
Problem: NPPV/CPAP (Adult)  Prevent and manage potential problems including:  1. conjunctivitis  2. dry mucous membranes  3. gastric distension leading to aspiration  4. hypoxia/hypoxemia  5. situational response  6. skin breakdown  Goal: Signs and Symptoms of Listed Potential Problems Will be Absent, Minimized or Managed (NPPV/CPAP)  Signs and symptoms of listed potential problems will be absent, minimized or managed by discharge/transition of care (reference NPPV/CPAP (Adult) CPG).  Outcome: Ongoing (see interventions/notes)

## 2016-09-16 NOTE — Care Management Notes (Signed)
Referral Information  ++++++ Placed Provider #1 ++++++  Case Manager: Carrie O'Neil  Provider Type: Home Health  Provider Name: [facility]  Address:  [address]  [place], [place] [address]  Contact:    Fax:   Fax:

## 2016-09-19 ENCOUNTER — Ambulatory Visit
Admission: RE | Admit: 2016-09-19 | Discharge: 2016-09-19 | Disposition: A | Discharge status: Home / Self Care | Payer: BC Managed Care – PPO | Source: Ambulatory Visit | Attending: Specialist | Admitting: Specialist

## 2016-09-19 ENCOUNTER — Encounter (HOSPITAL_COMMUNITY): Payer: Self-pay | Admitting: Specialist

## 2016-09-19 DIAGNOSIS — R3589 Other polyuria: Secondary | ICD-10-CM

## 2016-09-19 DIAGNOSIS — R358 Other polyuria: Secondary | ICD-10-CM | POA: Insufficient documentation

## 2016-09-19 DIAGNOSIS — J90 Pleural effusion, not elsewhere classified: Secondary | ICD-10-CM

## 2016-09-19 NOTE — Patient Instructions (Signed)
Patient navigator contacted the patient to ensure having the order for CXR, and patient informed he would be doing the lab work today immediately after. Also called Home Health, spoke with Myriam Jacobson, RN. Stated that [facility] was 3/31 and informed that patient was doing outpatient labs for today. Tedra Coupe

## 2016-09-22 NOTE — Anesthesia Postprocedure Evaluation (Signed)
Anesthesia Post Op Evaluation    Patient: [name] [name]  Procedure(s):  Autologous Blood Conservation Setup  Autologous Blood Conservation, Monitoring Per Hour  Perfusion Charge/Stby/Cardiopulmonary Bypass  Emergency Bleeding Heart    Last Vitals:Temperature: 36.7 C (98 F) (09/16/16 0801)  Heart Rate: (!) 105 (09/16/16 0801)  BP (Non-Invasive): 126/78 (09/16/16 0801)  Respiratory Rate: 16 (09/16/16 0801)  SpO2-1: 93 % (09/16/16 0801)  Pain Score (Numeric, Faces): 8 (09/16/16 1055)  Patient is sufficiently recovered from the effects of anesthesia to participate in the evaluation and has returned to their pre-procedure level.  Patient location during evaluation: ICU   Post-procedure handoff checklist completed    Patient participation: complete - patient participated  Level of consciousness: awake and alert and responsive to verbal stimuli  Pain management: adequate  Airway patency: patent  Anesthetic complications: no  Cardiovascular status: acceptable  Respiratory status: acceptable  Hydration status: acceptable  Patient post-procedure temperature: Pt Normothermic   PONV Status: Absent

## 2016-09-22 NOTE — Anesthesia Preprocedure Evaluation (Signed)
ANESTHESIA PRE-OP EVALUATION  Planned Procedure: Autologous Blood Conservation Setup (N/A )  Autologous Blood Conservation, Monitoring Per Hour (N/A )  Perfusion Charge/Stby/Cardiopulmonary Bypass (N/A )  Emergency Bleeding Heart  Review of Systems         patient summary reviewed  nursing notes reviewed        Pulmonary   asthma and sleep apnea  Cardiovascular    Hypertension and CADNo peripheral edema       GI/Hepatic/Renal   GERD    Endo/Other         Neuro/Psych/MS    Cancer                                  Physical Assessment      Patient summary reviewed and Nursing notes reviewed   Airway                 Endotracheal tube present      Dental                    Pulmonary           Cardiovascular        (-) no peripheral edema     Other findings            Plan  Planned anesthesia type: GETA    ASA 4 - emergent     Intravenous induction   Central line and Arterial line                        Plan discussed with attending and resident.    (Due to emergent case, consent not obtained. )

## 2016-09-22 NOTE — Anesthesia Postprocedure Evaluation (Signed)
Anesthesia Post Op Evaluation    Patient: [name] [name]  Procedure(s) with comments:  STERNOTOMY MEDIASTINAL - CVICU-12  In-Hse Stby (Cpb, Trauma, Bleeding, Cs) Per Hour  Autologous Blood Conservation, Monitoring Per Hour  Autologous Blood Conservation Setup  Emergency Bleeding Heart    Last Vitals:Temperature: 36.7 C (98 F) (09/16/16 0801)  Heart Rate: (!) 105 (09/16/16 0801)  BP (Non-Invasive): 126/78 (09/16/16 0801)  Respiratory Rate: 16 (09/16/16 0801)  SpO2-1: 93 % (09/16/16 0801)  Pain Score (Numeric, Faces): 8 (09/16/16 1055)  Patient is sufficiently recovered from the effects of anesthesia to participate in the evaluation and has returned to their pre-procedure level.  Patient location during evaluation: ICU   Post-procedure handoff checklist completed    Patient participation: complete - patient participated  Level of consciousness: awake and alert and responsive to verbal stimuli  Pain management: adequate  Airway patency: patent  Anesthetic complications: no  Cardiovascular status: acceptable  Respiratory status: acceptable  Hydration status: acceptable  Patient post-procedure temperature: Pt Normothermic   PONV Status: Absent

## 2016-09-29 ENCOUNTER — Encounter (HOSPITAL_COMMUNITY): Payer: Self-pay | Admitting: Thoracic Surgery (Cardiothoracic Vascular Surgery)

## 2016-09-29 NOTE — Patient Instructions (Signed)
Patient Navigator contacted the patient to do follow up call. Postoperative discharge courtesy call completed. Spoke with patient regarding recent  cardiac surgery. Reviewed discharge questionnaire, addressed patient concerns and answered patient questions. Confirmed follow up appointment and testing. Patient encouraged to call the [facility] triage call center with any further questions or concerns at [phone]. Patient verbalized understanding of all instructions.   Patient has cardiologist. Patient made aware of all upcoming appointments. Tedra Coupe

## 2016-10-04 ENCOUNTER — Encounter (HOSPITAL_COMMUNITY): Payer: Self-pay | Admitting: Specialist

## 2016-10-04 NOTE — Patient Instructions (Signed)
Postoperative discharge courtesy call completed. Spoke with patient regarding recent cardiac surgery. Reviewed discharge questionnaire, addressed patient concerns and answered patient questions. Confirmed follow up appointment and testing. Patient encouraged to call the [facility] triage call center with any further questions or concerns at [phone]. Patient verbalized understanding of all instructions.   [name] [name]

## 2016-10-13 ENCOUNTER — Ambulatory Visit: Payer: BC Managed Care – PPO | Attending: Physician Assistant | Admitting: Physician Assistant

## 2016-10-13 ENCOUNTER — Encounter (HOSPITAL_COMMUNITY): Payer: Self-pay | Admitting: Physician Assistant

## 2016-10-13 VITALS — BP 121/81 | HR 79 | Temp 98.2°F | Ht 74.0 in | Wt 267.0 lb

## 2016-10-13 DIAGNOSIS — Z6834 Body mass index (BMI) 34.0-34.9, adult: Secondary | ICD-10-CM

## 2016-10-13 DIAGNOSIS — Z955 Presence of coronary angioplasty implant and graft: Secondary | ICD-10-CM | POA: Insufficient documentation

## 2016-10-13 DIAGNOSIS — Z79899 Other long term (current) drug therapy: Secondary | ICD-10-CM | POA: Insufficient documentation

## 2016-10-13 DIAGNOSIS — I10 Essential (primary) hypertension: Secondary | ICD-10-CM | POA: Insufficient documentation

## 2016-10-13 DIAGNOSIS — I2510 Atherosclerotic heart disease of native coronary artery without angina pectoris: Secondary | ICD-10-CM | POA: Insufficient documentation

## 2016-10-13 DIAGNOSIS — I200 Unstable angina: Secondary | ICD-10-CM

## 2016-10-13 DIAGNOSIS — Z7982 Long term (current) use of aspirin: Secondary | ICD-10-CM | POA: Insufficient documentation

## 2016-10-13 DIAGNOSIS — Z951 Presence of aortocoronary bypass graft: Secondary | ICD-10-CM | POA: Insufficient documentation

## 2016-10-13 DIAGNOSIS — Z9889 Other specified postprocedural states: Secondary | ICD-10-CM | POA: Insufficient documentation

## 2016-10-13 DIAGNOSIS — Z7902 Long term (current) use of antithrombotics/antiplatelets: Secondary | ICD-10-CM | POA: Insufficient documentation

## 2016-10-13 DIAGNOSIS — E785 Hyperlipidemia, unspecified: Secondary | ICD-10-CM | POA: Insufficient documentation

## 2016-10-13 MED ORDER — METOPROLOL TARTRATE 75 MG TABLET
75.0000 mg | ORAL_TABLET | Freq: Two times a day (BID) | ORAL | 2 refills | Status: DC
Start: 2016-10-13 — End: 2016-12-22

## 2016-10-13 NOTE — Progress Notes (Signed)
See dictated note.

## 2016-10-14 NOTE — Progress Notes (Signed)
PATIENT NAME: [name] [name], [name] [name]  HOSPITAL NUMBER:  [id number]  DATE OF SERVICE: 10/13/2016  DATE OF BIRTH:      CLINIC NOTE    PRIMARY CARE PHYSICIAN:  Berlin Hun, MD.    PRIMARY CARDIOLOGIST:  Jodi Geralds, MD.    SUBJECTIVE:  Mr. Hackbart is a very pleasant 51 year old white male gentleman with a known history of coronary disease status post previous PCI to his LAD in 2012 followed by PTCA for in-stent restenosis in October 2016.  The patient was recently hospitalized on September 06, 2016, with unstable angina.  The patient underwent coronary angiography and was felt to be best served with single-vessel coronary artery bypass surgery.  The patient went to the operating room on September 09, 2016, where he underwent coronary bypass surgery, minimally invasive with his LIMA to his LAD.  Postop day 1, the patient was having bleeding and required re-exploration with sternotomy.  The patient did require 2 units of packed red blood cells.  The patient was discharged to home on September 16, 2016, and comes in today for followup.     The patient states he feels great.  He feels much better than he did.  He states he has a great energy level.  He feels the best that he has felt in a long time.  He is anxious to get back to work.  He denies any chest pain or chest pressure.  He denies any cough shortness of breath.  He denies any light headedness, dizziness or syncope.  He denies any bleeding episodes.  He really clinically seems to be doing quite well.    OBJECTIVE:  Physical exam shows a pleasant white male gentleman, afebrile and hemodynamically stable.  Blood pressure is 121/81, pulse 79, weight is 260 pounds, which is down about 18 pounds from his discharge weight.  HEENT:  Head is normocephalic, atraumatic.  His eyes are nonicteric.  His nose is midline.  Neck is supple.  Carotids equal bilateral without bruit.  His heart is regular rate and rhythm and rhythm without murmur.  His lungs are clear bilaterally.   Abdomen is round, soft, bowel sounds are positive.  Extremities are without edema.  Skin is without rash.  His midline sternal scar as well as his other abdominal incisions are well healed without evidence of erythema or drainage.    MEDICATIONS:  Current Outpatient Prescriptions   Medication Sig   . ALBUTEROL SULFATE (PROAIR HFA INHL) Take 1-2 Puffs by inhalation Every 6 hours as needed    . aspirin 81 mg Oral Tablet, Chewable Take 1 Tab (81 mg total) by mouth Once a day   . atorvastatin (LIPITOR) 20 mg Oral Tablet Take 1 Tab (20 mg total) by mouth Every evening   . clopidogrel (PLAVIX) 75 mg Oral Tablet Take 1 Tab (75 mg total) by mouth Once a day   . fluticasone (FLONASE) 50 mcg/actuation Nasal Spray, Suspension 1 Spray by Each Nostril route Once a day   . fluticasone-vilanterol 100-25 mcg/dose Inhalation Disk with Device Take 1 INHALATION by inhalation Once a day    . ipratropium-albuterol 0.5 mg-3 mg(2.5 mg base)/3 mL Solution for Nebulization 3 mL by Nebulization route Four times a day   . melatonin 3 mg Oral Tablet Take 1 Tab (3 mg total) by mouth Every night as needed   . metoprolol tartrate (LOPRESSOR) 75 mg Oral Tablet Take 1 Tab (75 mg total) by mouth Every 12 hours   . montelukast (SINGULAIR) 10 mg  Oral Tablet Take 10 mg by mouth Every evening   . nitroglycerin (NITROSTAT) 0.4 mg Sublingual Tablet, Sublingual 0.4 mg by Sublingual route Every 5 minutes as needed for Chest pain for 3 doses over 15 minutes   . ranitidine (ZANTAC) 150 mg Oral Tablet Take 150 mg by mouth Twice daily   . VENTOLIN HFA 90 mcg/actuation Inhalation HFA Aerosol Inhaler      ASSESSMENT AND PLAN:  1. Coronary artery disease, status post minimally invasive coronary bypass surgery x1 with his LIMA to his LAD requiring open sternotomy on postop day 1 secondary to bleeding.  The patient clinically remains asymptomatic, doing well and feels great.  He is anxious to start getting back to work and doing more physical activity.  He is  walking approximately a mile and half a day and symptomatically feels the best he has felt in a while.  He currently remains on aspirin, Plavix and Lopressor.  We will go ahead and see the patient back in a 41-month time period with the understanding that should he have any changes prior to that time he needs to call.  He has follow up with Dr. Anette Riedel this coming Monday.  2. Hypertension, currently controlled with Lopressor.  3. Hyperlipidemia.  He remains on Lipitor 20 mg p.o. daily.  4. The patient will be seen back in followup in a 72-month time period with the with the understanding that should he have any changes prior to that time that he needs to call or return back to the nearest Emergency Department.      The patient was seen independently with supervising physician available for consultation.    Curlene Dolphin, PA-C  Chief Advanced Practice Professional   [facility] Department of Medicine, Section of Cardiology    Jodi Geralds, MD  Assistant Professor, Interventional Cardiology   [facility] Department of Medicine    Agree with the above    Jodi Geralds, MD  10/18/2016, 11:11      CC:   Berlin Hun, MD   [facility]   [address]   [place], [place] [address]       DD:  10/13/2016 16:29:45  DT:  10/14/2016 17:49:20 NW  D#:  [id number]

## 2016-10-17 ENCOUNTER — Encounter (HOSPITAL_COMMUNITY): Payer: Self-pay | Admitting: Specialist

## 2016-10-17 ENCOUNTER — Ambulatory Visit (HOSPITAL_BASED_OUTPATIENT_CLINIC_OR_DEPARTMENT_OTHER)
Admission: RE | Admit: 2016-10-17 | Discharge: 2016-10-17 | Disposition: A | Discharge status: Home / Self Care | Payer: BC Managed Care – PPO | Source: Ambulatory Visit

## 2016-10-17 ENCOUNTER — Ambulatory Visit (HOSPITAL_BASED_OUTPATIENT_CLINIC_OR_DEPARTMENT_OTHER): Payer: BC Managed Care – PPO | Admitting: Specialist

## 2016-10-17 ENCOUNTER — Ambulatory Visit
Admission: RE | Admit: 2016-10-17 | Discharge: 2016-10-17 | Disposition: A | Discharge status: Home / Self Care | Payer: BC Managed Care – PPO | Source: Ambulatory Visit | Attending: Nurse Practitioner | Admitting: Nurse Practitioner

## 2016-10-17 VITALS — BP 129/84 | HR 64 | Temp 98.5°F | Ht 74.0 in | Wt 267.4 lb

## 2016-10-17 DIAGNOSIS — Z79899 Other long term (current) drug therapy: Secondary | ICD-10-CM | POA: Insufficient documentation

## 2016-10-17 DIAGNOSIS — J45909 Unspecified asthma, uncomplicated: Secondary | ICD-10-CM | POA: Insufficient documentation

## 2016-10-17 DIAGNOSIS — I200 Unstable angina: Secondary | ICD-10-CM

## 2016-10-17 DIAGNOSIS — Z7902 Long term (current) use of antithrombotics/antiplatelets: Secondary | ICD-10-CM | POA: Insufficient documentation

## 2016-10-17 DIAGNOSIS — Z888 Allergy status to other drugs, medicaments and biological substances status: Secondary | ICD-10-CM | POA: Insufficient documentation

## 2016-10-17 DIAGNOSIS — I25110 Atherosclerotic heart disease of native coronary artery with unstable angina pectoris: Secondary | ICD-10-CM | POA: Insufficient documentation

## 2016-10-17 DIAGNOSIS — R9431 Abnormal electrocardiogram [ECG] [EKG]: Secondary | ICD-10-CM

## 2016-10-17 DIAGNOSIS — Z87891 Personal history of nicotine dependence: Secondary | ICD-10-CM | POA: Insufficient documentation

## 2016-10-17 DIAGNOSIS — K219 Gastro-esophageal reflux disease without esophagitis: Secondary | ICD-10-CM | POA: Insufficient documentation

## 2016-10-17 DIAGNOSIS — Z951 Presence of aortocoronary bypass graft: Principal | ICD-10-CM

## 2016-10-17 DIAGNOSIS — Z7982 Long term (current) use of aspirin: Secondary | ICD-10-CM | POA: Insufficient documentation

## 2016-10-17 DIAGNOSIS — J90 Pleural effusion, not elsewhere classified: Secondary | ICD-10-CM | POA: Insufficient documentation

## 2016-10-17 DIAGNOSIS — I517 Cardiomegaly: Secondary | ICD-10-CM | POA: Insufficient documentation

## 2016-10-17 DIAGNOSIS — I10 Essential (primary) hypertension: Secondary | ICD-10-CM | POA: Insufficient documentation

## 2016-10-17 DIAGNOSIS — E7800 Pure hypercholesterolemia, unspecified: Secondary | ICD-10-CM | POA: Insufficient documentation

## 2016-10-17 NOTE — Patient Instructions (Addendum)
Cardiothoracic Surgery  [address], [address]  [place], [place] [address]  Phone / [phone]  Fax / [phone]    Postoperative Follow-up      Medication Adjustments:  None.         Follow up:  Establish care or follow up with your Cardiologist and PCP. They will be managing your medications and adjustments needed in the future.     You do not need to follow with Cardiac Surgery again. If any questions arise, we are happy to assist you.

## 2016-10-17 NOTE — Progress Notes (Signed)
Care Team:     Primary Care Providers:  Lawernce Ion, DO (General)    Referring Provider:  Jodi Geralds    Subjective:     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  He is also status post exploratory median sternotomy on 09/10/16 for post-operative bleeding.  Today he is here for his post-operative follow up visit.  He reports he is doing excellent and offer no new issues or concerns.  He reports increased strength and physical endurance.  He denies any chest pain, dyspnea, dizziness, lightheadedness, palpitations, syncope or lower leg edema.  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  He does not perform any lifting or pulling.  He denies any fevers, chills, malaise, sternal clicking or wound drainage.     Patient Active Problem List    Diagnosis Date Noted   . Hypokalemia 09/07/2016   . Unstable angina (HCC) 09/06/2016   . Chest pain 04/22/2016   . CAD (coronary artery disease) 01/25/2013     Past Medical History:   Diagnosis Date   . Asthma    . Coronary artery disease    . CPAP (continuous positive airway pressure) dependence    . Esophageal reflux    . Hypercholesterolemia    . Hypertension    . Knee injury    . Sleep apnea           Past Surgical History:   Procedure Laterality Date   . CORONARY ARTERY ANGIOPLASTY     . HX DENTAL EXTRACTION     . HX STENTING (ANY)  06/15/2011   . HX TONSIL AND ADENOIDECTOMY                 Allergies   Allergen Reactions   . Phenergan [Promethazine] Seizure      Social History   Substance Use Topics   . Smoking status: Former Smoker     Years: 10.00     Quit date: 04/20/1992   . Smokeless tobacco: Never Used    . Alcohol use No      Family Medical History     None          Current Outpatient Prescriptions:   .  ALBUTEROL SULFATE (PROAIR HFA INHL), Take 1-2 Puffs by inhalation Every 6 hours as needed , Disp: , Rfl:   .  aspirin 81 mg Oral Tablet, Chewable, Take 1 Tab (81 mg total) by mouth Once a day, Disp: 60 Tab, Rfl: 2  .  atorvastatin (LIPITOR) 20 mg Oral Tablet, Take 1 Tab (20 mg total) by mouth Every evening, Disp: 90 Tab, Rfl: 3  .  clopidogrel (PLAVIX) 75 mg Oral Tablet, Take 1 Tab (75 mg total) by mouth Once a day, Disp: 90 Tab, Rfl: 1  .  fluticasone (FLONASE) 50 mcg/actuation Nasal Spray, Suspension, 1 Spray by Each Nostril route Once a day, Disp: , Rfl:   .  fluticasone-vilanterol 100-25 mcg/dose Inhalation Disk with Device, Take 1 INHALATION by inhalation Once a day , Disp: , Rfl:   .  ipratropium-albuterol 0.5 mg-3 mg(2.5 mg base)/3 mL Solution for Nebulization, 3 mL by Nebulization route Four times a day, Disp: ,  Rfl:   .  melatonin 3 mg Oral Tablet, Take 1 Tab (3 mg total) by mouth Every night as needed, Disp: 30 Tab, Rfl: 0  .  metoprolol tartrate (LOPRESSOR) 75 mg Oral Tablet, Take 1 Tab (75 mg total) by mouth Every 12 hours, Disp: 180 Tab, Rfl: 2  .  montelukast (SINGULAIR) 10 mg Oral Tablet, Take 10 mg by mouth Every evening, Disp: , Rfl:   .  nitroglycerin (NITROSTAT) 0.4 mg Sublingual Tablet, Sublingual, 0.4 mg by Sublingual route Every 5 minutes as needed for Chest pain for 3 doses over 15 minutes, Disp: , Rfl:   .  ranitidine (ZANTAC) 150 mg Oral Tablet, Take 150 mg by mouth Twice daily, Disp: , Rfl:   .  VENTOLIN HFA 90 mcg/actuation Inhalation HFA Aerosol Inhaler, , Disp: , Rfl:            Objective:     BP 129/84  Pulse 64  Temp 36.9 C (98.5 F) (Oral)   Ht 1.88 m ( )  Wt 121.3 kg (267 lb 6.7 oz)  SpO2 96%  BMI 34.33 kg/m2  Physical Exam:  General appearance: Appears healthy.  Alert; in no acute distress.  Pleasant.  Cardiac: PMI normal. No lifts, heaves, or thrills. RRR. No murmurs,  clicks, gallops, or rubs    Sternum stable no clicking, popping, grinding heard or palpated  Respiratory: Respiratory effort normal and clear to auscultation  Abdomen: soft, non-tender. Bowel sounds normal. No masses,  no organomegaly  Extremities: no cyanosis, clubbing or edema present    Surgical Incisions:   Chest: well healing, without drainage, no erythema, abnormal warmth or induration      Data Review  Chest xray:   XR CHEST PA AND LATERAL performed on 10/17/2016 11:11 AM.    REASON FOR EXAM:  I20.0: Unstable angina (HCC)    TECHNIQUE: 3 views/3 images submitted for interpretation.    COMPARISON:  September 19, 2016    FINDINGS:  There is a left-sided pleural effusion, not significantly  changed from the previous exam. The heart is enlarged with postoperative  changes from median sternotomy again demonstrated. A coronary stent is also  again seen.    IMPRESSION:  Stable cardiomegaly and left pleural effusion.    EKG: normal sinus rhythm heart rate 71 beats per minute    Assessment:     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  He is also status post exploratory median sternotomy on 09/10/16 for post-operative bleeding.  Today he is here for his post-operative follow up visit.  His chest x-ray revealed stable cardiomegaly and small left pleural effusion in a clinical asymptomatic patient with no dyspnea and 96% on room air.  His EKG revealed normal sinus rhythm heart rate 71 beats per minute.  Clinically he is progressing along very well.     Plan:     1.  He can now drive.  2.  He does decline cardiac rehab,  3.  He can return to work tomorrow with sternal precautions.  4.  Continue with PCP and cardiology follow up with Dr.  Basilio Cairo  5.  Follow up as needed.     Idelle Leech, APRN 10/17/2016, 13:10

## 2016-10-18 LAB — ECG 12-LEAD
Atrial Rate: 71 {beats}/min
Calculated P Axis: 11 degrees
Calculated R Axis: 92 degrees
Calculated T Axis: 79 degrees
QRS Duration: 92 ms
QTC Calculation: 419 ms
Ventricular rate: 71 {beats}/min

## 2016-10-26 ENCOUNTER — Telehealth (HOSPITAL_COMMUNITY): Payer: Self-pay | Admitting: Physician Assistant

## 2016-10-26 NOTE — Telephone Encounter (Signed)
FW: Patient of Renzelli--Letter to Fax  Received: Today     Barrie FolkGnegy, Stephen, PA-C  Baker, Yannick Steuber A, RN                That is fine. Brett CanalesSteve         Previous Messages      ----- Message -----    From: Drenda FreezeBaker, Herman Fiero A, RN    Sent: 10/26/2016  2:47 PM     To: Curlene DolphinStephen Gnegy, PA-C   Subject: FW: Patient of Renzelli--Letter to Fax        [facility] signed off on him it looks like. Do you want to give him the okay? Or should I direct this to S[facility]?   ----- Message -----    From: Deetta PerlaJarrell, Lauren    Sent: 10/26/2016  2:44 PM     To: Drenda FreezeElizabeth A Baker, RN   Subject: Patient of Renzelli--Letter to Fax                              Message from Deetta PerlaLauren Jarrell sent at 10/26/2016 2:44 PM EDT      Summary: FW: Patient of Renzelli--Letter to Fax     Patient of Renzelli    Patient calling in stating he had open heart surgery about 6 weeks ago and is needing a letter faxed to his employer stating he is allowed to walk up and down stairs. Please fax it with attention to Marisue Brooklynastor George to [phone].    Thank you.                      Call History         Type Contact     10/26/2016 02:41 PM Phone (Incoming) Shyrl NumbersAllen, Giorgi A (Self)     Phone: [phone] Judie Petit(M)     User: Deetta PerlaJarrell, Lauren         Letter provided to above contact.

## 2016-12-22 ENCOUNTER — Other Ambulatory Visit (HOSPITAL_COMMUNITY): Payer: Self-pay | Admitting: Physician Assistant

## 2016-12-22 MED ORDER — METOPROLOL TARTRATE 50 MG TABLET
75.0000 mg | ORAL_TABLET | Freq: Two times a day (BID) | ORAL | 5 refills | Status: DC
Start: 2016-12-22 — End: 2017-06-19

## 2017-01-12 ENCOUNTER — Encounter (HOSPITAL_COMMUNITY): Payer: BC Managed Care – PPO | Admitting: Physician Assistant

## 2017-01-17 ENCOUNTER — Encounter (HOSPITAL_COMMUNITY): Payer: BC Managed Care – PPO | Admitting: Physician Assistant

## 2017-01-24 ENCOUNTER — Encounter (HOSPITAL_COMMUNITY): Payer: BC Managed Care – PPO | Admitting: Physician Assistant

## 2017-04-11 ENCOUNTER — Other Ambulatory Visit (HOSPITAL_COMMUNITY): Payer: Self-pay | Admitting: Physician Assistant

## 2017-06-19 ENCOUNTER — Ambulatory Visit (HOSPITAL_BASED_OUTPATIENT_CLINIC_OR_DEPARTMENT_OTHER): Payer: BC Managed Care – PPO | Admitting: Physician Assistant

## 2017-06-19 ENCOUNTER — Encounter (HOSPITAL_COMMUNITY): Payer: Self-pay | Admitting: Physician Assistant

## 2017-06-19 ENCOUNTER — Ambulatory Visit
Admission: RE | Admit: 2017-06-19 | Discharge: 2017-06-19 | Disposition: A | Discharge status: Home / Self Care | Payer: BC Managed Care – PPO | Source: Ambulatory Visit | Attending: Internal Medicine | Admitting: Internal Medicine

## 2017-06-19 VITALS — BP 129/90 | HR 93 | Temp 98.4°F | Ht 74.0 in | Wt 301.8 lb

## 2017-06-19 DIAGNOSIS — K219 Gastro-esophageal reflux disease without esophagitis: Secondary | ICD-10-CM

## 2017-06-19 DIAGNOSIS — R0602 Shortness of breath: Secondary | ICD-10-CM

## 2017-06-19 DIAGNOSIS — R635 Abnormal weight gain: Secondary | ICD-10-CM

## 2017-06-19 DIAGNOSIS — R059 Cough, unspecified: Secondary | ICD-10-CM

## 2017-06-19 DIAGNOSIS — I10 Essential (primary) hypertension: Secondary | ICD-10-CM | POA: Insufficient documentation

## 2017-06-19 DIAGNOSIS — R0609 Other forms of dyspnea: Secondary | ICD-10-CM

## 2017-06-19 DIAGNOSIS — Z7902 Long term (current) use of antithrombotics/antiplatelets: Secondary | ICD-10-CM | POA: Insufficient documentation

## 2017-06-19 DIAGNOSIS — I2510 Atherosclerotic heart disease of native coronary artery without angina pectoris: Secondary | ICD-10-CM | POA: Insufficient documentation

## 2017-06-19 DIAGNOSIS — E785 Hyperlipidemia, unspecified: Secondary | ICD-10-CM | POA: Insufficient documentation

## 2017-06-19 DIAGNOSIS — Z79899 Other long term (current) drug therapy: Secondary | ICD-10-CM | POA: Insufficient documentation

## 2017-06-19 DIAGNOSIS — Z9889 Other specified postprocedural states: Secondary | ICD-10-CM | POA: Insufficient documentation

## 2017-06-19 DIAGNOSIS — Z6838 Body mass index (BMI) 38.0-38.9, adult: Secondary | ICD-10-CM

## 2017-06-19 DIAGNOSIS — R05 Cough: Secondary | ICD-10-CM | POA: Insufficient documentation

## 2017-06-19 DIAGNOSIS — Z7982 Long term (current) use of aspirin: Secondary | ICD-10-CM | POA: Insufficient documentation

## 2017-06-19 DIAGNOSIS — Z951 Presence of aortocoronary bypass graft: Secondary | ICD-10-CM | POA: Insufficient documentation

## 2017-06-19 MED ORDER — CLOPIDOGREL 75 MG TABLET: Tab | ORAL | 4 refills | 0 days | Status: DC

## 2017-06-19 MED ORDER — SODIUM CHLORIDE 0.9 % (FLUSH) INJECTION SYRINGE
2.00 mL | INJECTION | Freq: Three times a day (TID) | INTRAMUSCULAR | Status: DC
Start: 2017-06-19 — End: 2017-06-20

## 2017-06-19 MED ORDER — METOPROLOL TARTRATE 50 MG TABLET
75.00 mg | ORAL_TABLET | Freq: Two times a day (BID) | ORAL | 5 refills | Status: DC
Start: 2017-06-19 — End: 2019-03-22

## 2017-06-19 MED ORDER — ASPIRIN 81 MG CHEWABLE TABLET
81.0000 mg | CHEWABLE_TABLET | Freq: Every day | ORAL | 2 refills | Status: DC
Start: 2017-06-19 — End: 2020-01-15

## 2017-06-19 MED ORDER — SODIUM CHLORIDE 0.9 % (FLUSH) INJECTION SYRINGE
2.00 mL | INJECTION | INTRAMUSCULAR | Status: DC | PRN
Start: 2017-06-19 — End: 2017-06-20

## 2017-06-19 MED ORDER — PERFLUTREN LIPID MICROSPHERES 1.1 MG/ML INTRAVENOUS SUSPENSION
0.30 mL | INTRAVENOUS | Status: AC
Start: 2017-06-19 — End: 2017-06-19
  Administered 2017-06-19: 0.3 mL via INTRAVENOUS

## 2017-06-19 NOTE — Progress Notes (Signed)
PATIENT NAME: [name] [name] [name], [name] [name] [name]:  [id number]  DATE OF SERVICE: 06/19/2017  DATE OF BIRTH:      CLINIC NOTE    PRIMARY CARE PHYSICIAN:  Berlin HunAlan J Barnett, MD.    PRIMARY CARDIOLOGIST:  Jodi GeraldsAnthony Roda-Renzelli, MD.    SUBJECTIVE:  [name] [name] [name] is [name] [name] very pleasant 51 year old white male gentleman who comes in today accompanied by his wife.  The patient states that clinically he is doing very well.  He states he is very concerned because he has gained approximately 30 pounds since his last visit.  The patient states that he really does not think that it is secondary to true weight gain, but fluid retention.  He states he tries to remain as active as possible.  He still tries to watch [name] [name] very limited diet.  Of interest though, his wife who accompanies him seems to think that he is snacking more and enjoying more pizza.  He denies any chest pain or chest pressure.  He denies any light headedness, dizziness or syncope.  He denies any shortness of breath.  He states sometimes he will get [name] [name] little bit short of breath walking up the steps.  He does complain of [name] [name] cough.  He denies any orthopnea.  He denies any lower extremity edema.  The patient did see his primary care physician who prescribed him Lasix 20 mg p.o. daily.  The patient states he took the Lasix and really did not notice significant change in weight or symptoms.    OBJECTIVE:  Physical exam shows an obese white male gentleman, afebrile and hemodynamically stable.  Blood pressure 129/90, pulse 93 and weight 136 kg.  HEENT:  Head is normocephalic, atraumatic.  Eyes are nonicteric.  Nose is midline.  Neck is supple.  Carotids are equal bilateral without bruit.  His heart is regular rate and rhythm without murmur.  His lungs are clear bilaterally.  His abdomen is large, round, soft, bowel sounds positive.  There is no guarding or rebound. Extremities are without edema. Skin is pale warm and dry without obvious rash.    MEDICATIONS:  Current Outpatient  Medications   Medication Sig   . aspirin 81 mg Oral Tablet, Chewable Take 1 Tab (81 mg total) by mouth Once [name] [name] day   . atorvastatin (LIPITOR) 20 mg Oral Tablet Take 1 Tab (20 mg total) by mouth Every evening   . clopidogrel (PLAVIX) 75 mg Oral Tablet TAKE ONE TABLET BY MOUTH ONCE DAILY   . fluticasone-vilanterol 100-25 mcg/dose Inhalation Disk with Device Take 1 INHALATION by inhalation Once [name] [name] day    . furosemide (LASIX) 20 mg Oral Tablet Take 20 mg by mouth Once [name] [name] day   . ipratropium-albuterol 0.5 mg-3 mg(2.5 mg base)/3 mL Solution for Nebulization 3 mL by Nebulization route Four times [name] [name] day   . melatonin 3 mg Oral Tablet Take 1 Tab (3 mg total) by mouth Every night as needed   . metoprolol tartrate (LOPRESSOR) 50 mg Oral Tablet Take 1.5 Tabs (75 mg total) by mouth Twice daily   . montelukast (SINGULAIR) 10 mg Oral Tablet Take 10 mg by mouth Every evening   . nitroglycerin (NITROSTAT) 0.4 mg Sublingual Tablet, Sublingual 0.4 mg by Sublingual route Every 5 minutes as needed for Chest pain for 3 doses over 15 minutes   . ranitidine (ZANTAC) 150 mg Oral Tablet Take 150 mg by mouth Twice daily   . VENTOLIN HFA 90 mcg/actuation Inhalation [employer] Corporation[employer]FA Aerosol Inhaler  ASSESSMENT/PLAN:  1. Coronary artery disease, status post minimally invasive coronary bypass surgery x1, with his LIMA to his LAD in March of this year.  Unfortunately, the patient did require an open sternotomy secondary to bleeding.  The patient is asymptomatic, doing quite well.  He remains anginal free.  He currently remains on aspirin, Plavix, Lopressor and statin therapies.  We will go ahead and see the patient back in [name] [name] [address]-month time period with the understanding that should he have any change in his condition prior to that time, he needs to call or return back to the nearest Emergency Department.  2. Weight gain, shortness of breath with cough.  The patient's lungs are clear.  He has no peripheral edema.  We went ahead and check [name] [name] 2D echocardiogram  today.  His ejection fraction looks to be well-preserved preliminarily.  I again reassured him that I did not think this was heart failure going, but more actual weight gain.  I had [name] [name] very blunt discussion with the patient about caloric consumption and the need for lifestyle modifications for weight reduction.  I reviewed with him [name] [name]  cardiac diet and caloric counting.  At this point in time we will go ahead and discontinue his Lasix.  3. Hypertension, currently controlled on current medical therapy.  4. Hyperlipidemia.  Remains on Lipitor 20 mg p.o. daily.  The patient's last LDL was 49.   5. Gastroesophageal reflux disease, stable on Zantac.  6. The patient will be seen back in [name] [name] [address]-month time period with the understanding that should he have any change in his condition prior to that time he needs to call or return back to the nearest Emergency Department.  The patient has been counseled in the need for caloric restriction for weight reduction as he has gained approximately 30 pounds since his last visit.  [name] [name] preliminary echocardiogram does not show any signs or symptoms of failure.      The patient was seen independently with supervising physician available for consultation.    Curlene DolphinStephen Gnegy, PA-C  Chief Advanced Practice Professional   [facility], Section of Cardiology    Jodi GeraldsAnthony Roda-Renzelli, MD  Assistant Professor, Interventional Cardiology   [facility]    Agree with the above    Jodi GeraldsAnthony Roda-Renzelli, MD  06/26/2017, 07:35      CC:   Berlin HunAlan J Barnett, MD   [facility]   [address]   [place] [id number]       DD:  06/19/2017 14:44:14  DT:  06/19/2017 17:20:20 KB  D#:  [id number]

## 2017-06-19 NOTE — Nurses Notes (Signed)
Placed 22ga IV angiocath to left AC. Blood return noted. Definity given as per protocol. IV left in place due to pt needing to see provider.

## 2017-06-19 NOTE — Progress Notes (Signed)
See dictated note.

## 2017-06-19 NOTE — Nurses Notes (Signed)
Removed 22ga IV angiocath from Left Ac. Cath tip intact upon removal. 2x2 and papertape applied to site. Pt tolerated procedure well.

## 2017-06-22 LAB — TRANSTHORACIC ECHOCARDIOGRAM - ADULT
AV mean gradient: 3
AV peak velocity post stress: 109
AVA VTI: 4
AVA Vmax: 4
Biplane Simpson EF: 60
EF MEASUREMENT VALUE: 63
Interventricular Septum Diastolic Thickness by 2D: 0.9 cm
LA Volume Index: 14
LVIDD - 2D: 4.3 cm
LVIDS 2D: 2.8 cm
LVPWD: 1.1 cm
MV E/A: 1
Please see Linked Document for Final Report.: NORMAL
Please see Linked Document for Final Report.: NORMAL
Please see Linked Document for Final Report.: NORMAL
TR VELOCITY: 228

## 2017-07-18 LAB — HIV1/HIV2 SCREEN, COMBINED ANTIGEN AND ANTIBODY: HIV SCREEN, COMBINED ANTIGEN & ANTIBODY: NEGATIVE

## 2017-12-19 ENCOUNTER — Encounter (HOSPITAL_COMMUNITY): Payer: Self-pay | Admitting: Physician Assistant

## 2018-04-25 ENCOUNTER — Other Ambulatory Visit (HOSPITAL_COMMUNITY): Payer: Self-pay | Admitting: Physician Assistant

## 2018-04-25 NOTE — Care Management Notes (Signed)
[facility] INTAKE    PATIENT INFORMATION    PCP: Dorothyann Peng, DO  Effective Insurance:   Payor/Plan Subscr Relation Effective Group Num   1. OTHER WORK COMP OUT OF STA* [name],[name]  [birth year] [id number]                                   [address], [place] [address]   2. BLUE CROSS BLUE SHIELD - H* [name],[name]  [birth year] [id number]                                   [address]       Insurance Comments:     RESERVATION INFORMATION  Received call from:   Phone:   Referring Provider:  [place]        Referring Provider Phone: [phone]    Transfer Source: [facility]     Transfer Emergent:  Urgent  Transfer Reason: Level of Care not available at Current Facility  Transfer Comments:   Admitted on: 04/25/2018   Pt Class and Level of Care: Emergency Emergency  Diagnosis: Unstable Angina      CLINICAL INFORMATION  Intubated: No    PMH:    Past Medical History:   Diagnosis Date   . Asthma    . Coronary artery disease    . CPAP (continuous positive airway pressure) dependence    . Esophageal reflux    . Hypercholesterolemia    . Hypertension    . Knee injury    . Sleep apnea      Dialysis: no  Cancer: no  Isolation: no  Is patient established with family practice/attending?      Patient story/clinical presentation:per Dr Christianne Dolin, pt presented to ED with complaints of "crushing " chest pain, radiating into his neck and left arm. Pt with hx of CAD, Bypass in 2018 after failed stenting. Pt treated with ASA, Plavix, Lopressor and Heparin. Dr Christianne Dolin connected with Dr Dossie Der Cardiology [facility] Rm Fellow who accepted pt to Med Hospitalist 8 service, Stepdown bed.    qSOFA Score >= 2 = Positive                 Value                   Score    Respiratory Rate >= 22 breaths per minute = 1 point 17 0   Systolic Blood Pressure <= 100 mmHg = 1 point 120 0   Altered Mental Status: GCS <15 = 1 point none 0   Total Score  0     Serum Lactate Level >= 31mol/L (36 mg/ dL)= Positive:  Initial call level (if not available put NA) n/a      Followed up call level          Positive Screen: no  Antibiotics: no  Fluids/Pressor:  no  Blood Cultures:  no  Serum Lactate: no    Current vital signs:    HR:     92   BP: 120/63   Resp: 17   Temp: 98.7   Sats: 94   O2: 2L NC       Per MD labs WNL unless noted below.    Labs:  WBC (3.6-11)    HGB (13.1-17.3)    Hct (39.8-50.2)    PLT (140-400)  Na (135-145)    K+ (3.5-5.1)    CL (96-111)    CO2 (23-35)    BUN (8-26)    Cr (0.62-1.27)    Glucose (60-105)    Ca (8.5-10.4)    Mag (1.6-2.5)    Phos (2.4-4.7)    BNP (<100)    D-Dimer (<233)    AST (8-41)    ALT (<55)    Alk Phos (<150)    Amylase (25-125)    Lipase (10-80)    T. Bili (0.3-1.3)    PT (8.7-13.2)    PTT (25.1-36.5)    INR (0.8-1.2)    Trop-1 (<0.03)    CK    CPK    CK-MB    ABG ph    ABG PCO2    ABG PO2    ABG HCO3    Base def/exc    LP Glucose    LP Neutrophil    LP Protein    LP WBC    LP Other            Radiology (please place images on image grid):Chest X-ray, EKG  EKG:  IV Access:  Medications, IVF, Drips:         Admitting Pt Class and Level of Care: Inpatient, Step Down,   Accepted By: Gerrit Friends, HOSPITALIST 8 CARDIAC RE*    *Send Text Page to accepting service MD. *

## 2018-04-26 ENCOUNTER — Observation Stay (HOSPITAL_BASED_OUTPATIENT_CLINIC_OR_DEPARTMENT_OTHER): Payer: MEDICAID

## 2018-04-26 ENCOUNTER — Inpatient Hospital Stay (HOSPITAL_COMMUNITY): Payer: MEDICAID | Admitting: Internal Medicine

## 2018-04-26 ENCOUNTER — Encounter (HOSPITAL_COMMUNITY): Payer: Self-pay | Admitting: Student in an Organized Health Care Education/Training Program

## 2018-04-26 ENCOUNTER — Ambulatory Visit (HOSPITAL_COMMUNITY): Payer: MEDICAID

## 2018-04-26 ENCOUNTER — Observation Stay
Admission: AD | Admit: 2018-04-26 | Discharge: 2018-04-27 | Disposition: A | Discharge status: Home / Self Care | Payer: MEDICAID | Source: Other Acute Inpatient Hospital | Attending: Internal Medicine | Admitting: Internal Medicine

## 2018-04-26 DIAGNOSIS — E7800 Pure hypercholesterolemia, unspecified: Secondary | ICD-10-CM | POA: Insufficient documentation

## 2018-04-26 DIAGNOSIS — I10 Essential (primary) hypertension: Secondary | ICD-10-CM

## 2018-04-26 DIAGNOSIS — Z955 Presence of coronary angioplasty implant and graft: Secondary | ICD-10-CM

## 2018-04-26 DIAGNOSIS — F17200 Nicotine dependence, unspecified, uncomplicated: Secondary | ICD-10-CM

## 2018-04-26 DIAGNOSIS — Z23 Encounter for immunization: Secondary | ICD-10-CM | POA: Insufficient documentation

## 2018-04-26 DIAGNOSIS — Z7982 Long term (current) use of aspirin: Secondary | ICD-10-CM | POA: Insufficient documentation

## 2018-04-26 DIAGNOSIS — Z79899 Other long term (current) drug therapy: Secondary | ICD-10-CM | POA: Insufficient documentation

## 2018-04-26 DIAGNOSIS — I2510 Atherosclerotic heart disease of native coronary artery without angina pectoris: Secondary | ICD-10-CM

## 2018-04-26 DIAGNOSIS — K219 Gastro-esophageal reflux disease without esophagitis: Secondary | ICD-10-CM

## 2018-04-26 DIAGNOSIS — E669 Obesity, unspecified: Secondary | ICD-10-CM | POA: Insufficient documentation

## 2018-04-26 DIAGNOSIS — Z888 Allergy status to other drugs, medicaments and biological substances status: Secondary | ICD-10-CM | POA: Insufficient documentation

## 2018-04-26 DIAGNOSIS — I25700 Atherosclerosis of coronary artery bypass graft(s), unspecified, with unstable angina pectoris: Secondary | ICD-10-CM | POA: Diagnosis present

## 2018-04-26 DIAGNOSIS — I517 Cardiomegaly: Secondary | ICD-10-CM

## 2018-04-26 DIAGNOSIS — G4733 Obstructive sleep apnea (adult) (pediatric): Secondary | ICD-10-CM | POA: Insufficient documentation

## 2018-04-26 DIAGNOSIS — Z951 Presence of aortocoronary bypass graft: Secondary | ICD-10-CM

## 2018-04-26 DIAGNOSIS — F1910 Other psychoactive substance abuse, uncomplicated: Secondary | ICD-10-CM

## 2018-04-26 DIAGNOSIS — E876 Hypokalemia: Secondary | ICD-10-CM | POA: Insufficient documentation

## 2018-04-26 DIAGNOSIS — Z7902 Long term (current) use of antithrombotics/antiplatelets: Secondary | ICD-10-CM | POA: Insufficient documentation

## 2018-04-26 DIAGNOSIS — Z87891 Personal history of nicotine dependence: Secondary | ICD-10-CM | POA: Insufficient documentation

## 2018-04-26 DIAGNOSIS — E785 Hyperlipidemia, unspecified: Secondary | ICD-10-CM | POA: Insufficient documentation

## 2018-04-26 DIAGNOSIS — J90 Pleural effusion, not elsewhere classified: Secondary | ICD-10-CM

## 2018-04-26 DIAGNOSIS — Z6837 Body mass index (BMI) 37.0-37.9, adult: Secondary | ICD-10-CM | POA: Insufficient documentation

## 2018-04-26 DIAGNOSIS — I200 Unstable angina: Secondary | ICD-10-CM

## 2018-04-26 DIAGNOSIS — I2582 Chronic total occlusion of coronary artery: Principal | ICD-10-CM | POA: Insufficient documentation

## 2018-04-26 DIAGNOSIS — I361 Nonrheumatic tricuspid (valve) insufficiency: Secondary | ICD-10-CM

## 2018-04-26 DIAGNOSIS — I445 Left posterior fascicular block: Secondary | ICD-10-CM

## 2018-04-26 DIAGNOSIS — F1911 Other psychoactive substance abuse, in remission: Secondary | ICD-10-CM | POA: Insufficient documentation

## 2018-04-26 DIAGNOSIS — I249 Acute ischemic heart disease, unspecified: Secondary | ICD-10-CM

## 2018-04-26 DIAGNOSIS — R9431 Abnormal electrocardiogram [ECG] [EKG]: Secondary | ICD-10-CM

## 2018-04-26 DIAGNOSIS — I25110 Atherosclerotic heart disease of native coronary artery with unstable angina pectoris: Secondary | ICD-10-CM | POA: Insufficient documentation

## 2018-04-26 LAB — CBC WITH DIFF
BASOPHIL #: <0.1 x10ˆ3/uL (ref <=0.20)
BASOPHIL %: 1 %
EOSINOPHIL #: 0.17 x10ˆ3/uL (ref <=0.50)
EOSINOPHIL %: 2 %
HCT: 42.8 % (ref 38.9–52.0)
HGB: 14.8 g/dL (ref 13.4–17.5)
IMMATURE GRANULOCYTE #: <0.1 10*3/uL (ref <0.10)
IMMATURE GRANULOCYTE #: <0.1 x10ˆ3/uL (ref <0.10)
IMMATURE GRANULOCYTE %: 0 % (ref 0–1)
LYMPHOCYTE #: 1.96 x10ˆ3/uL (ref 1.00–4.80)
LYMPHOCYTE %: 25 %
MCH: 29.1 pg (ref 26.0–32.0)
MCHC: 34.6 g/dL (ref 31.0–35.5)
MCV: 84.1 fL (ref 78.0–100.0)
MONOCYTE #: 0.77 x10ˆ3/uL (ref 0.20–1.10)
MONOCYTE %: 10 %
MPV: 10.5 fL (ref 8.7–12.5)
NEUTROPHIL #: 4.88 x10ˆ3/uL (ref 1.50–7.70)
NEUTROPHIL %: 62 %
PLATELETS: 239 x10ˆ3/uL (ref 150–400)
RBC: 5.09 x10ˆ6/uL (ref 4.50–6.10)
RDW-CV: 12.2 % (ref 11.5–15.5)
WBC: 7.8 x10ˆ3/uL (ref 3.7–11.0)

## 2018-04-26 LAB — ECG 12-LEAD
Atrial Rate: 89 {beats}/min
Atrial Rate: 75 {beats}/min
Calculated P Axis: 43 degrees
Calculated R Axis: 134 degrees
Calculated R Axis: 128 degrees
Calculated T Axis: 30 degrees
PR Interval: 176 ms
PR Interval: 188 ms
QRS Duration: 110 ms
QRS Duration: 110 ms
QT Interval: 374 ms
QT Interval: 388 ms
QTC Calculation: 455 ms
QTC Calculation: 433 ms
Ventricular rate: 89 {beats}/min
Ventricular rate: 75 {beats}/min

## 2018-04-26 LAB — HEPATIC FUNCTION PANEL
ALBUMIN: 3.4 g/dL — ABNORMAL LOW (ref 3.5–5.0)
ALT (SGPT): 18 U/L (ref <55)
BILIRUBIN TOTAL: 1 mg/dL (ref 0.3–1.3)

## 2018-04-26 LAB — BASIC METABOLIC PANEL
ANION GAP: 7 mmol/L (ref 4–13)
BUN/CREA RATIO: 13 (ref 6–22)
BUN: 15 mg/dL (ref 8–25)
CALCIUM: 8.6 mg/dL (ref 8.5–10.2)
CHLORIDE: 109 mmol/L (ref 96–111)
CO2 TOTAL: 23 mmol/L (ref 22–32)
CREATININE: 1.18 mg/dL (ref 0.62–1.27)
ESTIMATED GFR: >60 mL/min/1.73mˆ2 (ref >60)
GLUCOSE: 140 mg/dL — ABNORMAL HIGH (ref 65–139)
POTASSIUM: 3.3 mmol/L — ABNORMAL LOW (ref 3.5–5.1)
SODIUM: 139 mmol/L (ref 136–145)

## 2018-04-26 LAB — LIPID PANEL
CHOL/HDL RATIO: 4.6
CHOLESTEROL: 124 mg/dL (ref <200)
HDL CHOL: 27 mg/dL — ABNORMAL LOW (ref >=39)
LDL CALC: 68 mg/dL (ref <=100)
NON-HDL: 97 mg/dL (ref <=190)
TRIGLYCERIDES: 143 mg/dL (ref <=150)
TRIGLYCERIDES: 143 mg/dL (ref <=150)
VLDL CALC: 29 mg/dL (ref <30)

## 2018-04-26 LAB — HGA1C (HEMOGLOBIN A1C WITH EST AVG GLUCOSE)
ESTIMATED AVERAGE GLUCOSE: 120 mg/dL
HEMOGLOBIN A1C: 5.8 % — ABNORMAL HIGH (ref 4.0–5.6)

## 2018-04-26 LAB — POC BLOOD GLUCOSE (RESULTS)
GLUCOSE, POC: 101 mg/dl (ref 70–105)
GLUCOSE, POC: 93 mg/dl (ref 70–105)
GLUCOSE, POC: 88 mg/dl (ref 70–105)
GLUCOSE, POC: 95 mg/dL (ref 70–105)

## 2018-04-26 LAB — TYPE AND SCREEN
ABO/RH(D): O POS
ANTIBODY SCREEN: NEGATIVE

## 2018-04-26 LAB — TROPONIN-I: TROPONIN I: <7 ng/L (ref 0–30)

## 2018-04-26 MED ORDER — IPRATROPIUM 0.5 MG-ALBUTEROL 3 MG (2.5 MG BASE)/3 ML NEBULIZATION SOLN
3.00 mL | INHALATION_SOLUTION | Freq: Three times a day (TID) | RESPIRATORY_TRACT | Status: DC
Start: 2018-04-26 — End: 2018-04-27
  Administered 2018-04-26 – 2018-04-27 (×4): 3 mL via RESPIRATORY_TRACT
  Administered 2018-04-27: 0 mL via RESPIRATORY_TRACT
  Filled 2018-04-26: qty 12

## 2018-04-26 MED ORDER — CARVEDILOL 3.125 MG TABLET
3.1250 mg | ORAL_TABLET | Freq: Two times a day (BID) | ORAL | Status: DC
Start: 2018-04-26 — End: 2018-04-27
  Administered 2018-04-26 – 2018-04-27 (×3): 3.125 mg via ORAL
  Filled 2018-04-26 (×3): qty 1

## 2018-04-26 MED ORDER — BUDESONIDE-FORMOTEROL HFA 80 MCG-4.5 MCG/ACTUATION AEROSOL INHALER - RN
2.00 | Freq: Two times a day (BID) | RESPIRATORY_TRACT | Status: DC
Start: 2018-04-26 — End: 2018-04-27
  Administered 2018-04-26 – 2018-04-27 (×4): 2 via RESPIRATORY_TRACT
  Filled 2018-04-26: qty 6.9

## 2018-04-26 MED ORDER — ALBUTEROL SULFATE HFA 90 MCG/ACTUATION AEROSOL INHALER
2.00 | INHALATION_SPRAY | RESPIRATORY_TRACT | Status: DC | PRN
Start: 2018-04-26 — End: 2018-04-27

## 2018-04-26 MED ORDER — MONTELUKAST 10 MG TABLET
10.00 mg | ORAL_TABLET | Freq: Every evening | ORAL | Status: DC
Start: 2018-04-26 — End: 2018-04-27
  Administered 2018-04-26: 10 mg via ORAL
  Administered 2018-04-26: 0 mg via ORAL
  Filled 2018-04-26: qty 1

## 2018-04-26 MED ORDER — SODIUM CHLORIDE 0.9 % INTRAVENOUS SOLUTION
INTRAVENOUS | Status: DC
Start: 2018-04-26 — End: 2018-04-26
  Administered 2018-04-26: 0 via INTRAVENOUS

## 2018-04-26 MED ORDER — INSULIN LISPRO 100 UNIT/ML SUB-Q SSIP
0.00 [IU] | INJECTION | SUBCUTANEOUS | Status: DC | PRN
Start: 2018-04-26 — End: 2018-04-27
  Filled 2018-04-26: qty 3

## 2018-04-26 MED ORDER — FAMOTIDINE 20 MG TABLET
20.0000 mg | ORAL_TABLET | Freq: Two times a day (BID) | ORAL | Status: DC
Start: 2018-04-26 — End: 2018-04-27
  Administered 2018-04-26 – 2018-04-27 (×3): 20 mg via ORAL
  Filled 2018-04-26 (×3): qty 1

## 2018-04-26 MED ORDER — CLOPIDOGREL 75 MG TABLET
75.0000 mg | ORAL_TABLET | Freq: Every day | ORAL | Status: DC
Start: 2018-04-26 — End: 2018-04-27
  Administered 2018-04-26 – 2018-04-27 (×2): 75 mg via ORAL
  Filled 2018-04-26 (×2): qty 1

## 2018-04-26 MED ORDER — POTASSIUM CHLORIDE ER 20 MEQ TABLET,EXTENDED RELEASE(PART/CRYST)
40.0000 meq | ORAL_TABLET | Freq: Once | ORAL | Status: AC
Start: 2018-04-26 — End: 2018-04-26
  Administered 2018-04-26: 40 meq via ORAL
  Filled 2018-04-26: qty 2

## 2018-04-26 MED ORDER — ASPIRIN 81 MG CHEWABLE TABLET
81.0000 mg | CHEWABLE_TABLET | Freq: Every day | ORAL | Status: DC
Start: 2018-04-26 — End: 2018-04-27
  Administered 2018-04-26 – 2018-04-27 (×2): 81 mg via ORAL
  Filled 2018-04-26 (×2): qty 1

## 2018-04-26 MED ORDER — FLU VACCINE QS 2019-20(6MOS UP)(PF) 60 MCG(15 MCGX4)/0.5 ML IM SYRINGE
0.5000 mL | INJECTION | Freq: Once | INTRAMUSCULAR | Status: AC
Start: 2018-04-26 — End: 2018-04-26
  Administered 2018-04-26: 0.5 mL via INTRAMUSCULAR
  Filled 2018-04-26: qty 0.5

## 2018-04-26 MED ORDER — HEPARIN (PORCINE) 5,000 UNIT/ML INJECTION SOLUTION
5000.0000 [IU] | Freq: Three times a day (TID) | INTRAMUSCULAR | Status: DC
Start: 2018-04-26 — End: 2018-04-27
  Administered 2018-04-26 (×3): 5000 [IU] via SUBCUTANEOUS
  Administered 2018-04-27: 0 [IU] via SUBCUTANEOUS
  Administered 2018-04-27: 06:00:00 5000 [IU] via SUBCUTANEOUS
  Filled 2018-04-26 (×7): qty 1

## 2018-04-26 MED ORDER — PERFLUTREN LIPID MICROSPHERES 1.1 MG/ML INTRAVENOUS SUSPENSION
2.00 mL | INTRAVENOUS | Status: AC
Start: 2018-04-26 — End: 2018-04-26
  Administered 2018-04-26: 2 mL via INTRAVENOUS

## 2018-04-26 MED ORDER — FUROSEMIDE 20 MG TABLET
20.0000 mg | ORAL_TABLET | Freq: Every day | ORAL | Status: DC
Start: 2018-04-26 — End: 2018-04-27
  Administered 2018-04-26: 20 mg via ORAL
  Administered 2018-04-27 (×2): 0 mg via ORAL
  Filled 2018-04-26 (×2): qty 1

## 2018-04-26 MED ORDER — MELATONIN 3 MG TABLET
3.00 mg | ORAL_TABLET | Freq: Every evening | ORAL | Status: DC
Start: 2018-04-26 — End: 2018-04-27
  Administered 2018-04-26: 3 mg via ORAL
  Administered 2018-04-26: 0 mg via ORAL
  Filled 2018-04-26: qty 1

## 2018-04-26 MED ORDER — ATORVASTATIN 40 MG TABLET
40.00 mg | ORAL_TABLET | Freq: Every evening | ORAL | Status: DC
Start: 2018-04-26 — End: 2018-04-27
  Administered 2018-04-26: 0 mg via ORAL
  Administered 2018-04-26: 40 mg via ORAL
  Filled 2018-04-26: qty 1

## 2018-04-26 MED ORDER — NITROGLYCERIN 0.4 MG SUBLINGUAL TABLET
0.4000 mg | SUBLINGUAL_TABLET | SUBLINGUAL | Status: DC | PRN
Start: 2018-04-26 — End: 2018-04-27
  Administered 2018-04-26: 0.4 mg via SUBLINGUAL
  Filled 2018-04-26: qty 25

## 2018-04-26 MED ADMIN — ipratropium 0.5 mg-albuteroL 3 mg (2.5 mg base)/3 mL nebulization soln: RESPIRATORY_TRACT | @ 15:00:00

## 2018-04-26 MED ADMIN — heparin (porcine) 5,000 unit/mL injection solution: SUBCUTANEOUS | @ 15:00:00

## 2018-04-26 MED ADMIN — magnesium hydroxide 400 mg/5 mL oral suspension: RESPIRATORY_TRACT | @ 21:00:00

## 2018-04-26 MED ADMIN — heparin (porcine) (PF) 1,000 unit/500 mL in 0.9 % sodium chloride IV: ORAL | @ 21:00:00

## 2018-04-26 NOTE — Nurses Notes (Signed)
Patient arrived to 8SE bed 29. VSS. Patient A&Ox4. Oriented to unit routines and call light system. Patient refuses a Lobbyist and VM evening after education about fall safety. Patient agrees to call nurse or CA before getting out of bed. Will continue to monitor.

## 2018-04-26 NOTE — Transitional Care (Signed)
Hospital follow up appointment scheduled for Thursday, 11/14 @ 10 am, which is within 7 days of discharge. Discharge date/time along with coordinators phone number added to after visit summary.    [facility] [place], LPN  16/06/958, 07:56

## 2018-04-26 NOTE — H&P (Signed)
[facility]  General Medicine  Admission H&P    Date of Service:  04/26/2018  [name] [name], 52 y.o. male  Date of Admission:  04/26/2018  Date of Birth:    PCP: Lawernce Ion, DO    LAY CAREGIVER   Appointed Lay Caregiver?: I Decline     Information Obtained from: patient  Chief Complaint:  Chest pain    HPI: [name] [name] is a 52 y.o., White male with pmhx significant for CAD s/p CABGx1 (2018), hx of polysubstance abuse in remission since 1994, presents to [facility] with several hour history of chest pain. 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 Chest pain was left sided, began at his left shoulder and radiated down to his left arm. Pain was associated with nausea and was similar to his chest pain prior to his CABG. Pt reports pain relieved greatly with nitro. Pt loaded with asa, plavix, and started on heparin gtt. ecg significant for non specific st-twave changes, troponins negative. Pt transferred to [facility] for further evaluation.     PAST MEDICAL:    Past Medical History:   Diagnosis Date   . Asthma    . Coronary artery disease    . CPAP (continuous positive airway pressure) dependence    . Esophageal reflux    . Hypercholesterolemia    . Hypertension    . Knee injury    . Sleep apnea         Past Surgical History:   Procedure Laterality Date   . CORONARY ARTERY ANGIOPLASTY     . HX DENTAL EXTRACTION     . HX STENTING (ANY)  06/15/2011   . HX TONSIL AND ADENOIDECTOMY              Medications Prior to Admission     Prescriptions    aspirin 81 mg Oral Tablet, Chewable    Take 1 Tab (81 mg total) by mouth Once a day    atorvastatin (LIPITOR) 20 mg Oral Tablet    Take 1 Tab (20 mg total) by mouth Every evening    clopidogrel (PLAVIX) 75 mg Oral Tablet    TAKE 1 TABLET BY MOUTH ONCE DAILY    fluticasone-vilanterol 100-25 mcg/dose Inhalation Disk with Device    Take 1 INHALATION by inhalation Once a day     furosemide (LASIX) 20 mg Oral Tablet    Take 20  mg by mouth Once a day    ipratropium-albuterol 0.5 mg-3 mg(2.5 mg base)/3 mL Solution for Nebulization    3 mL by Nebulization route Four times a day    melatonin 3 mg Oral Tablet    Take 1 Tab (3 mg total) by mouth Every night as needed    metoprolol tartrate (LOPRESSOR) 50 mg Oral Tablet    Take 1.5 Tabs (75 mg total) by mouth Twice daily    Patient not taking:  Reported on 04/26/2018    montelukast (SINGULAIR) 10 mg Oral Tablet    Take 10 mg by mouth Every evening    nitroglycerin (NITROSTAT) 0.4 mg Sublingual Tablet, Sublingual    0.4 mg by Sublingual route Every 5 minutes as needed for Chest pain for 3 doses over 15 minutes    ranitidine (ZANTAC) 150 mg Oral Tablet    Take 150 mg by mouth Twice daily    VENTOLIN HFA 90 mcg/actuation Inhalation HFA Aerosol Inhaler  Allergies   Allergen Reactions   . Phenergan [Promethazine] Seizure       Social History  Social History     Socioeconomic History   . Marital status: Married     Spouse name: Not on file   . Number of children: Not on file   . Years of education: Not on file   . Highest education level: Not on file   Occupational History   . Not on file   Social Needs   . Financial resource strain: Not on file   . Food insecurity:     Worry: Not on file     Inability: Not on file   . Transportation needs:     Medical: Not on file     Non-medical: Not on file   Tobacco Use   . Smoking status: Former Smoker     Years: 10.00     Last attempt to quit: 04/20/1992     Years since quitting: 26.0   . Smokeless tobacco: Never Used   Substance and Sexual Activity   . Alcohol use: No     Alcohol/week: 0.0 standard drinks   . Drug use: No   . Sexual activity: Not on file   Lifestyle   . Physical activity:     Days per week: Not on file     Minutes per session: Not on file   . Stress: Not on file   Relationships   . Social connections:     Talks on phone: Not on file     Gets together: Not on file     Attends religious service: Not on file     Active member of club or  organization: Not on file     Attends meetings of clubs or organizations: Not on file     Relationship status: Not on file   . Intimate partner violence:     Fear of current or ex partner: Not on file     Emotionally abused: Not on file     Physically abused: Not on file     Forced sexual activity: Not on file   Other Topics Concern   . Abuse/Domestic Violence Not Asked   . Caffeine Concern Not Asked   . Calcium intake adequate Not Asked   . Computer Use Not Asked   . Drives Not Asked   . Exercise Concern Not Asked   . Helmet Use Not Asked   . Seat Belt Not Asked   . Special Diet Not Asked   . Sunscreen used Not Asked   . Uses Cane Not Asked   . Uses walker Not Asked   . Uses wheelchair Not Asked   . Right hand dominant Not Asked   . Left hand dominant Not Asked   . Ambidextrous Not Asked   . Shift Work Not Asked   . Unusual Sleep-Wake Schedule Not Asked   Social History Narrative   . Not on file       ROS: Other than ROS in the HPI, all other systems were negative.    Examination:  Temperature: 36.4 C (97.6 F) Heart Rate: 92 BP (Non-Invasive): 125/85   Respiratory Rate: 16 SpO2: 95 % Pain Score (Numeric, Faces): 2     Physical Exam  Constitutional:  Appears chronically ill, stated age, in no acute distress  Eyes:  Conjunctiva clear., Pupils equal and round, reactive to light and accomodation. , Conjunctivae/corneas clear, PERRLA, EOM's intact. Fundi benign.  ENT:  ENMT  without erythema or injection, mucous membranes moist.  Neck:  no thyromegaly or lymphadenopathy and no thyromegaly  Respiratory:  Clear to auscultation bilaterally, sternal scar from CABG clean, no tenderness to chest palpation  Cardiovascular:  regular rate and rhythm, S1, S2 normal, no murmur, click, rub or gallop  Gastrointestinal:  Soft, non-tender, Bowel sounds normal, distended due to body habitus  Musculoskeletal:  Head atraumatic and normocephalic and AROM  Integumentary:  Skin warm and dry and No lesions  Neurologic:  Grossly  normal        Labs:      Imaging Studies:       DNR Status:  Prior    Assessment/Plan:   There are no active hospital problems to display for this patient.    Pt is a 52 yo M with pmhx of CABG presents with chest pain at rest. Ecg non specific changes, troponins initially negative at outside facility. Pt transferred to [facility] for further workup.      Unstable Angina  -chest pain present at rest, relieved with nitro  -pt on asa, plavix, statin at home  -loaded with asa, plavix, and started on heparin gtt prior to transfer  -ecg significant for q waves in inferior leads  -troponins negative  -TIMI score 2 which places pt in intermediate risk category   -will c/w asa, plavix, statin  -will continue to monitor trops and ekgs                  DVT/PE Prophylaxis: Heparin    Missy Sabins, MD

## 2018-04-26 NOTE — Consults (Signed)
[facility]   CARDIOLOGY CONSULTATION     Patient: [name] [name] [facility] day:  LOS: 0 days    Admission date: 04/26/2018 Service date: 04/26/2018     Service: HOSPITALIST 8 CARDIAC REFERENCE UNIT  Requesting MD: Marlaine Hind, MD    Impression/Recommendations:   [name] [name] is a 52 y.o.male w/hx of CAD s/p CABG x1 (2018) and previous history of multiple stents that have failed, polysubstance abuse (IV cocaine, acid, marijuana, mushrooms, hash, and alcohol) in remission since 1994, and tobacco use disorder, OSA (CPAP QHS), obesity, and GERD in remission since 1994 who presented with unstable angina.    Unstable Angina in the context of history of CAD s/p CABG x 1 (2018) and previous history of multiple failed coronary stents  - Troponin has remained negative at [facility] and while admitted   - EKG shows no acute abnormalities  - Given patient's symptoms that are similar to previous MI and significant cardiac history, recommend patient undergo cardiac catheterization tomorrow AM   - Please make patient NPO at midnight  - Agree with continuing with DAPT   - Agree with continuing coreg 3.125 mg BID  - Please order CARDIOLOGY CONSULT if not done so  - Thank you allowing [place] to participate in the care of this patient.  Please call/page [place] with any questions    Information Obtained from: patient, family at bedside, history reviewed from medical record  Reason for Consult:  Anginal Chest Pain      SUBJECTIVE   [name] [name] is a 52 y.o.male w/hx of CAD s/p CABG x1 (2018), HTN polysubstance abuse (IV cocaine, acid, marijuana, mushrooms, hash, and alcohol) in remission since 1994, tobacco use disorder in remission since 1994, OSA (CPAP QHS), obesity, GERD, who presented as a transfer from [facility] the HOSPITALIST 8 CARDIAC REFERENCE UNIT service with chief complaint of several hours of chest pain. Began approximately at 8 PM last night and patient had nausea and non-vertiginous lightheadedness. Began developing mild  left arm "discomfort like I slept on it wrong." Works night shift (started 1 week ago) and en route to work patient began developing severe left jaw pain radiating to his left shoulder (including posterior shoulder) and arm terminating at his elbow. Associated symptoms during acute event included nausea and dizziness as well as a hot flash but no diaphoresis. Patient reported similar pain to his presentation in 2018 requiring CABG x1 however at that presentation patient had diaphoresis and left arm pain radiating down to his wrist. Reported improvement in symptoms after nitroglycerin x1 and resolution of symptoms after second dose of nitroglycerin.  At [facility] patient was loaded with ASA, Plavix, and started on heparin gtt. EKG showed only non-specific ST-wave changes. Patient was transferred to [facility] for greater level of care and required additional nitroglycerin during transport.     Patient has 27 pack year history and IV cocaine use of one 8 ball per week for 10 years. Does report a 2 month history of fatigue and 2 week history of dyspnea on exertion (was able to walk 1 mile daily but no becomes short of breath prior to finishing the mile). Denies any additional episodes similar to the acute event that lead to his presentation since his CABG in 2018. Polysubstance abuse started in mid to late teens until age 2 when he ceased all substance use. At bedside patient reports only mild ache (1/10 severity) in left shoulder and fatigue but no chest pain, shortness of breath, N/V,  dizziness.     Patient follows with Dr. Marylen Ponto here at [facility] with last clinic visit on 06/19/2017 with 2D echocardiogram showing no signs of heart failure.     Lab values at admission include CBC w/ diff and BMP WNL aside from mild hypokalemia (K3.3). Lipid panel HDL 27, LDL 68, TG 143. HFP WNL. CXR at admission with mild cardiomegaly but no pulmonary edema.     Intake EKG: NSR with HR 75,  Non-specific ST wave changes, QTc 433    Intake  Troponins: < 7 at 0230 AM at admission, negative at [facility]     Cardiac History  Most recent TTE (04/26/2018):   Conclusions:  1. Suboptimal image quality, within this limitation:.  2. Normal left ventricular size. Normal left ventricular ejection fraction. LV Ejection Fraction is 72 %.  Concentric remodeling. Cannot determine LA pressure and diastolic dysfunction grade.  3. Resting Segmental Wall Motion Analysis: Regional WMA could not be assessed due to poor endocardial  border definition.  4. The right ventricle is not well visualized.  5. There is no significant valvular heart disease.    Previous  TTE (06/19/2017):   Conclusions:  1. Normal left ventricular size. Normal left ventricular ejection fraction. LV Ejection Fraction is 60 %.  Concentric remodeling. Abnormal diastolic function, low filling pressure.  2. Resting Segmental Wall Motion Analysis: Total wall motion score is 1.29. There is akinesis of the mid to  apical inferior wall. There is hypokinesis of the mid inferoseptal wall. The remaining left ventricular  segments demonstrate normal wall motion.  3. RV Systolic Pressure is normal.  4. The right atrium is normal in size.  5. The aortic root is normal in size.    Most recent stress test (04/22/2016):   IMPRESSION:  1. 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  No regional ischemia.  2. Abnormal TID ratio of 1.24 suggests transient ischemic dilation of the LV due to multivessel ischemia.  3. Calculated LVEF of 52%.    Most recent heart catheterization (09/07/2016):   IMPRESSION:  1.          Severe in-stent re-stenosis in the mid LAD.    2.          Angiographically normal left circumflex and right coronary artery.  3.          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    4.          Anterior and apical hypokinesis.    Current Guideline Based Medical Therapy for IHD  Cardioprotective   - ACEI/ARB: None  - B-blocker: Coreg 3.125 mg BID; historically was taking Lopressor 75 mg BID (stopped  04/26/2018)  - Statin: Lipitor 40 mg QD  - Antiplatelet therapy: ASA 81 mg QD, Plavix 75 mg QD  - Spironolactone: None  Antianginal  - Nitro: 0.4 mg Q23min PRN  - Ranexa: None    Past Medical History  Past Medical History:   Diagnosis Date   . Asthma    . Coronary artery disease    . CPAP (continuous positive airway pressure) dependence    . Esophageal reflux    . Hypercholesterolemia    . Hypertension    . Knee injury    . Sleep apnea          Past Surgical History  Past Surgical History:   Procedure Laterality Date   . CORONARY ARTERY ANGIOPLASTY     . HX DENTAL EXTRACTION     . HX STENTING (ANY)  06/15/2011   .  HX TONSIL AND ADENOIDECTOMY           Medications  Medications Prior to Admission     Prescriptions    aspirin 81 mg Oral Tablet, Chewable    Take 1 Tab (81 mg total) by mouth Once a day    atorvastatin (LIPITOR) 20 mg Oral Tablet    Take 1 Tab (20 mg total) by mouth Every evening    clopidogrel (PLAVIX) 75 mg Oral Tablet    TAKE 1 TABLET BY MOUTH ONCE DAILY    fluticasone-vilanterol 100-25 mcg/dose Inhalation Disk with Device    Take 1 INHALATION by inhalation Once a day     furosemide (LASIX) 20 mg Oral Tablet    Take 20 mg by mouth Once a day    ipratropium-albuterol 0.5 mg-3 mg(2.5 mg base)/3 mL Solution for Nebulization    3 mL by Nebulization route Four times a day    melatonin 3 mg Oral Tablet    Take 1 Tab (3 mg total) by mouth Every night as needed    metoprolol tartrate (LOPRESSOR) 50 mg Oral Tablet    Take 1.5 Tabs (75 mg total) by mouth Twice daily    Patient not taking:  Reported on 04/26/2018    montelukast (SINGULAIR) 10 mg Oral Tablet    Take 10 mg by mouth Every evening    nitroglycerin (NITROSTAT) 0.4 mg Sublingual Tablet, Sublingual    0.4 mg by Sublingual route Every 5 minutes as needed for Chest pain for 3 doses over 15 minutes    ranitidine (ZANTAC) 150 mg Oral Tablet    Take 150 mg by mouth Twice daily    VENTOLIN HFA 90 mcg/actuation Inhalation HFA Aerosol Inhaler         Allergies  Allergies   Allergen Reactions   . Phenergan [Promethazine] Seizure     No allergies to contrast media.    Family History  Family Medical History:     Problem Relation (Age of Onset)    Coronary Artery Disease Mother, Father            Social History  Social History     Socioeconomic History   . Marital status: Married     Spouse name: Not on file   . Number of children: Not on file   . Years of education: Not on file   . Highest education level: Not on file   Tobacco Use   . Smoking status: Former Smoker     Years: 10.00     Last attempt to quit: 04/20/1992     Years since quitting: 26.0   . Smokeless tobacco: Never Used   Substance and Sexual Activity   . Alcohol use: No     Alcohol/week: 0.0 standard drinks   . Drug use: No   Other Topics Concern       ROS:  Constitutional: Negative for fevers, chills, or weight loss.  Eyes: Negative for blurriness, tearing, or itching.  Ears, nose, mouth, throat, and face: Negative for loss of hearing, sore throat, or hoarseness.   Respiratory: + for dyspnea on exertion (more short of breath now during 1 mile walks than two weeks ago), No  wheezing, or coughing.  Cardiovascular: + for left jaw pain, left shoulder pain, left arm pain during acute event last night, now only with ache (1/10 severity), + for intermittent lower extremity edema, No palpitations.  Gastrointestinal: + for abdominal pain secondary to recently developed hernia, nausea during acute event but not  currently, No appetite changes, changes in bowel habits  Integument/breast: No rashes, itching, or lumps.  Hematologic/lymphatic: No history of easy bleeding or bruising.  Musculoskeletal: No weakness, muscle cramping, or joint stiffening.  Neurological: No sensory changes, tingling, tremors, or syncope.  Behavioral/Psych: No anxiety or depression.  Endocrine: No heat/cold intolerance, polyuria, or polydipsia.  Allergic/Immunologic: No rashes, swelling, or breathing difficulty.       OBJECTIVE    PHYSICAL EXAMINATION  Vitals:    04/26/18 0731 04/26/18 1001 04/26/18 1109 04/26/18 1557   BP: (!) 152/84 (!) 152/84 133/85 (!) 128/95   Pulse: 94  83 93   Resp: 16  14 15    Temp: 36.8 C (98.3 F)  37 C (98.6 F) 36.6 C (97.9 F)   SpO2: 97%  97% 96%   Weight:  134.2 kg (295 lb 13.7 oz)     Height:  1.905 m (6\' 3" )     BMI:  37.06           Constitutional: Obese male, lying in bed, no acute distress; alert and awake; oriented x 3  HENT:   Head: Normocephalic and atraumatic   Eyes: PERRL; EOMI; conjunctiva and sclera normal   Nose: No bleeding or deformity   Throat: Mucous membranes moist  Neck: Supple; ROM normal; no lymphadenopathy or thyromegaly, no JVD  Heart: RRR; S1 and S2 present; no murmurs, rubs, or gallops  Lungs: No respiratory distress; no accessory muscle recruitment; breath sounds symmetric; no coughing, crackling, or wheezing  Abdomen: Mild abdominal tenderness to palpation in LLQ (site of abdominal hernia), Soft, non-distended; no rebound tenderness; bowel sounds present; no guarding; no hepatosplenomegaly; no masses  Musculoskeletal: ROM normal; no edema; no gross deformity  Skin: Warm and dry; no rash, erythema, or jaundice  Neurological: Grossly in tact    Laboratory Studies  CBC Results Differential Results   Recent Results (from the past 30 hour(s))   CBC WITH DIFF    Collection Time: 04/26/18  2:30 AM   Result Value    WBC 7.8    HGB 14.8    HCT 42.8    PLATELETS 239    Recent Results (from the past 30 hour(s))   CBC WITH DIFF    Collection Time: 04/26/18  2:30 AM   Result Value    WBC 7.8    NEUTROPHIL % 62    MONOCYTE % 10    BASOPHIL % 1    BASOPHIL # <0.10      BMP Results Other Chemistries Results   Results for orders placed or performed during the hospital encounter of 04/26/18 (from the past 30 hour(s))   BASIC METABOLIC PANEL    Collection Time: 04/26/18  2:30 AM   Result Value    SODIUM 139    POTASSIUM 3.3 (L)    CHLORIDE 109    CO2 TOTAL 23    GLUCOSE 140 (H)    BUN 15     CREATININE 1.18    No results found for this or any previous visit (from the past 30 hour(s)).   Liver/Pancreas Enzyme Results Liver Function Results   Recent Results (from the past 30 hour(s))   HEPATIC FUNCTION PANEL    Collection Time: 04/26/18  2:30 AM   Result Value    ALKALINE PHOSPHATASE 83    ALT (SGPT) 18    AST (SGOT) 18    Recent Results (from the past 30 hour(s))   HEPATIC FUNCTION PANEL    Collection Time: 04/26/18  2:30 AM   Result Value    ALBUMIN 3.4 (L)    BILIRUBIN TOTAL 1.0    BILIRUBIN DIRECT 0.4 (H)      Cardiac Results Coags Results   Results for orders placed or performed during the hospital encounter of 04/26/18 (from the past 30 hour(s))   TROPONIN-I    Collection Time: 04/26/18  2:30 AM   Result Value    TROPONIN I <7    No results found for this or any previous visit (from the past 30 hour(s)).       Imaging Studies  XR AP MOBILE CHEST   Final Result   Stable cardiomegaly with small left pleural effusion.             Dinah Beers, MD 04/26/2018 17:13  PGY 1 Dept of Neurology  [facility]  Pager # [phone]    Late entry for 04/26/2018. I saw and examined the patient.  I reviewed the resident's note.  I agree with the findings and plan of care as documented in the resident's note.  Any exceptions/additions are edited/noted.    Ephriam Knuckles, MD

## 2018-04-26 NOTE — Care Plan (Signed)
Patient is calm and cooperative during shift. VSS. A&Ox4. MIVF started this evening. Chest pain relieved with rest. Frequent weight shifts encouraged to prevent skin breakdown. Sitter select and video monitoring was refused. Questions answered and reassurance provided as needed.

## 2018-04-26 NOTE — Progress Notes (Signed)
[facility]  Med Hospitalist HVI Reference Unit Progress Note      [name] [name]  Date of service: 04/26/2018  Date of Admission:  04/26/2018    Hospital Day:  LOS: 0 days   Subjective: Pt reported similar chest pain that he had prior to CABG while driving. Chest pain resolved with 2 Nitro. Pt without chest pain currently. Pt denies SOB or palpitations.     Vital Signs:  Temp  Avg: 36.6 C (97.9 F)  Min: 36.4 C (97.6 F)  Max: 36.8 C (98.3 F)    Pulse  Avg: 87  Min: 75  Max: 94 BP  Min: 124/79  Max: 152/84   Resp  Avg: 16.7  Min: 16  Max: 18 SpO2  Avg: 95.8 %  Min: 92 %  Max: 99 %   Pain Score (Numeric, Faces): 0      Input/Output    Intake/Output Summary (Last 24 hours) at 04/26/2018 0911  Last data filed at 04/26/2018 0800  Gross per 24 hour   Intake 335 ml   Output --   Net 335 ml    I/O last shift:  11/07 0700 - 11/07 1859  In: 250 [P.O.:100; I.V.:150]  Out: -        Current Facility-Administered Medications:  albuterol 90 mcg per inhalation oral inhaler - "Respiratory to administer" 2 Puff Inhalation Q4H PRN   aspirin chewable tablet 81 mg 81 mg Oral Daily   atorvastatin (LIPITOR) tablet 40 mg Oral QPM   budesonide-formoterol (SYMBICORT) 80 mcg-4.5 mcg per inhalation oral inhalaer - "Nursing to administer" 2 Puff Inhalation 2x/day   carvedilol (COREG) tablet 3.125 mg Oral 2x/day-Food   clopidogrel (PLAVIX) 75 mg tablet 75 mg Oral Daily   famotidine (PEPCID) tablet 20 mg Oral 2x/day   furosemide (LASIX) tablet 20 mg Oral Daily   heparin 5,000 unit/mL injection 5,000 Units Subcutaneous Q8HRS   influenza virus vaccine (PF) IM injection (FLUARIX for ages 6 months through adult) 0.5 mL Intramuscular Once   ipratropium-albuterol 0.5 mg-3 mg(2.5 mg base)/3 mL Solution for Nebulization 3 mL Nebulization 3x/day   melatonin tablet 3 mg Oral QPM   montelukast (SINGULAIR) 10 mg tablet 10 mg Oral QPM   nitroGLYCERIN (NITROSTAT) sublingual tablet 0.4 mg Sublingual Q5 Min PRN   perflutren lipid microspheres  (DEFINITY) 1.3 mL in NS 10 mL (tot vol) injection 2 mL Intravenous Give in Cardiology   SSIP insulin lispro (HUMALOG) 100 units/mL injection 0-12 Units Subcutaneous Q4H PRN       Physical Exam:  Constitutional:  appears stated age, moderately obese, and no distress  Eyes:  Conjunctiva clear.  ENT:  MMM  Respiratory:  Clear to auscultation bilaterally. No wheezes, rales, or rhonchi  Cardiovascular:  regular rate and rhythm, S1, S2 normal, no murmur, click, rub or gallop. No edema   Gastrointestinal:  Soft, non-tender, Bowel sounds normal, non-distended  Musculoskeletal:  Head atraumatic and normocephalic and AROM  Integumentary:  Skin warm and dry, No rashes. Well healed sternal scar  Neurologic:  Grossly normal, Alert and oriented x3  Psychiatric:  Normal, pleasant    Labs:  CBC Results Differential Results   Recent Results (from the past 30 hour(s))   CBC WITH DIFF    Collection Time: 04/26/18  2:30 AM   Result Value    WBC 7.8    HGB 14.8    HCT 42.8    PLATELETS 239    Recent Results (from the past 30 hour(s))  CBC WITH DIFF    Collection Time: 04/26/18  2:30 AM   Result Value    WBC 7.8    NEUTROPHIL % 62    MONOCYTE % 10    BASOPHIL % 1    BASOPHIL # <0.10      BMP Results Other Chemistries Results   Results for orders placed or performed during the hospital encounter of 04/26/18 (from the past 30 hour(s))   BASIC METABOLIC PANEL    Collection Time: 04/26/18  2:30 AM   Result Value    SODIUM 139    POTASSIUM 3.3 (L)    CHLORIDE 109    CO2 TOTAL 23    GLUCOSE 140 (H)    BUN 15    CREATININE 1.18    No results found for this or any previous visit (from the past 30 hour(s)).   Liver/Pancreas Enzyme Results Liver Function Results   Recent Results (from the past 30 hour(s))   HEPATIC FUNCTION PANEL    Collection Time: 04/26/18  2:30 AM   Result Value    ALKALINE PHOSPHATASE 83    ALT (SGPT) 18    AST (SGOT) 18    Recent Results (from the past 30 hour(s))   HEPATIC FUNCTION PANEL    Collection Time: 04/26/18  2:30  AM   Result Value    ALBUMIN 3.4 (L)    BILIRUBIN TOTAL 1.0    BILIRUBIN DIRECT 0.4 (H)      Cardiac Results Coags Results   Results for orders placed or performed during the hospital encounter of 04/26/18 (from the past 30 hour(s))   TROPONIN-I    Collection Time: 04/26/18  2:30 AM   Result Value    TROPONIN I <7    No results found for this or any previous visit (from the past 30 hour(s)).       Radiology:     CXR with trace L pleural effusion per my review     PT/OT: No    Consults: cardiology     Hardware (lines, foley's, tubes): PIV    Assessment/ Plan:   Active Hospital Problems    Diagnosis   . 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 male with PMH CAD s/p CABG x1 (2018), HTN, HLD, GERD, OSA, prior polysubstance abuse, and obesity who was transferred from [facility] where he presented with chest pain.    Chest pain in setting of known CAD s/p CABG x 1 (2018), concern for unstable angina   - EKG showed no ST segment changes   - troponins negative at [facility] and here    - CXR with trace L pleural effusion per my review    - TTE ordered    - continue ASA, Plavix, Lipitor, Coreg    - Nitro PRN   - cardiology consulted, appreciate assistance    - continuous telemetry     Hypokalemia   - ordered PO replacement     HTN  HLD   - continue meds as above    GERD   - resume H2 antagonist     Obesity   - BMI 37   - pt counseled > 3 minutes on diet/weight loss    - outpatient f/u with PCP for continued diet/weight loss counseling     OSA   - CPAP QHS   - pt counseled > 3 minutes on diet/weight loss     DVT/PE Prophylaxis:  Heparin    Disposition Planning: Home discharge      Nolon Rod, APRN,NP-C  04/26/2018, 09:01     I personally saw and examined the patient. See Nurse Practitioner's note for additional details. My findings are   NAE  Intermittent chest pain   Filed Vitals:    04/26/18 1109 04/26/18 1557 04/26/18 1800 04/26/18 1930   BP: 133/85 (!) 128/95  138/88    Pulse: 83 93 (!) 106    Resp: 14 15 13     Temp: 37 C (98.6 F) 36.6 C (97.9 F) 36.9 C (98.4 F)    SpO2: 97% 96% 96% 98%     Cardiac cath tomorrow  NPO after MN    Marlaine Hind, MD

## 2018-04-26 NOTE — Nurses Notes (Signed)
Pt experiencing neck/ shoulder pain (3/10). Pt hemodynamically stable, afebrile, diaphoretic, with no complaints of SOB.  Service paged and ordered to give PRN nitro.  Will continue to monitor.

## 2018-04-26 NOTE — Respiratory Therapy (Signed)
Service paged regarding pt's home respiratory medications and use of cpap machine at home. Awaiting call back.

## 2018-04-26 NOTE — Discharge Instructions (Signed)
If you have any questions after you are discharged, but before you see your Primary Care Physician you can contact Gwen Sarvis, Hospitalist Service Coordinator at [phone]    Darric Plante, LPN  Hospitalist Service Coordinator

## 2018-04-26 NOTE — Care Plan (Signed)
VS and assessment per flowsheet. .  Fall precautions maintained; Patient free from falls or any worsening skin breakdown.  Patient verbalizes understanding of plan of care, medications, and current conditions.  Call bell within reach. Will continue to monitor and provide education as needed.  Plan for cardiac cath tomorrow.  NPO at midnight

## 2018-04-26 NOTE — Care Management Notes (Signed)
[place] Management Initial Evaluation    Patient Name: [name] [name]  Date of Birth:   Sex: male  Date/Time of Admission: 04/26/2018  1:42 AM  Room/Bed: 29/A  Payor: [place] MEDICAID / Plan: [place] MEDICAID / Product Type: Medicaid /   Primary Care Providers:  Kathlee Nations, PA-C (General)    Pharmacy Info:   Preferred [place], [place] [address] [place] [address]    Phone: [phone] Fax: [phone]    Not a 24 hour pharmacy; exact hours not known.        Emergency Contact Info:   Extended Emergency Contact Information  Primary Emergency Contact: Choyce,RHONDA  Address: [address] [place], [place] [address] [place] Phone: [phone]  Work Phone: [phone]  Mobile Phone: [phone]  Relation: Wife    History:   [name] [name] is a 52 y.o., male, admitted for angina.    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 No   Care Management Plan   Discharge Planning Status initial meeting   Projected Discharge Date 04/27/18   CM will evaluate for rehabilitation potential yes   Discharge Needs Assessment   Equipment Currently Used at Home none   Equipment Needed After Discharge none   Discharge Facility/Level of Care Needs Home (Patient/Family Member/other)(code 1)   Transportation Available car;family or friend will provide   Referral Information   Admission Type inpatient   Address Verified verified-no changes   Arrived From acute hospital, other   [place] Other - See Comments  ([place])   Insurance Verified verified-no change   ADVANCE  DIRECTIVES   Does the Patient have an Advance Directive? No, Information Offered and Given   LAY CAREGIVER    Appointed Lay Caregiver? I Decline   Employment/Financial   Patient has Prescription Coverage?  Yes        Name of Insurance Coverage for Medications Medicaid   Financial Concerns none   Living Environment   Lives With spouse   Living Arrangements house   Able to Return to Prior Arrangements yes   Home Safety   Home Assessment: No Problems Identified   Home Accessibility stairs to enter home;no concerns   Living Environment   Number of Stairs to [place] 3       Discharge Plan:  Home (Patient/Family Member/other) (code 1)  [facility] met with patient for initial assessment.  Pt lives in a one story house with his wife.  She will be available to transport him home at discharge.  Pt independent prior to admission and works full time.  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  He manages his own medication.  His PCP is Elberta Spaniel PA-C and he was last seen last week.  Information on advanced directives offered and given.  Pt will get needed information for his secondary MPOA (pastor's address and phone number)  and I will assist him with completing these in the morning.    The patient will continue to be evaluated for developing discharge needs.     Case Manager: Alonza Smoker, [place] COORDINATOR  Phone: [phone]

## 2018-04-26 NOTE — Nurses Notes (Signed)
Pt's first dose of PRN nitro successful.  Pt's pain is a 0/10, but complaining of a headache.  Will continue to monitor pt

## 2018-04-27 ENCOUNTER — Encounter (HOSPITAL_COMMUNITY)
Admission: AD | Disposition: A | Discharge status: Home / Self Care | Payer: Self-pay | Source: Other Acute Inpatient Hospital | Attending: Internal Medicine

## 2018-04-27 DIAGNOSIS — I200 Unstable angina: Secondary | ICD-10-CM

## 2018-04-27 DIAGNOSIS — Z951 Presence of aortocoronary bypass graft: Secondary | ICD-10-CM

## 2018-04-27 DIAGNOSIS — I25110 Atherosclerotic heart disease of native coronary artery with unstable angina pectoris: Secondary | ICD-10-CM

## 2018-04-27 LAB — CBC
HCT: 44.4 % (ref 38.9–52.0)
HGB: 15.1 g/dL (ref 13.4–17.5)
MCH: 29.3 pg (ref 26.0–32.0)
MCHC: 34 g/dL (ref 31.0–35.5)
MCV: 86 fL (ref 78.0–100.0)
MCV: 86 fL (ref 78.0–100.0)
MPV: 10.5 fL (ref 8.7–12.5)
PLATELETS: 209 10*3/uL (ref 150–400)
RBC: 5.16 10*6/uL (ref 4.50–6.10)
RDW-CV: 12 % (ref 11.5–15.5)
WBC: 6.1 10*3/uL (ref 3.7–11.0)

## 2018-04-27 LAB — BASIC METABOLIC PANEL
ANION GAP: 9 mmol/L (ref 4–13)
BUN/CREA RATIO: 11 (ref 6–22)
BUN: 13 mg/dL (ref 8–25)
CALCIUM: 8.7 mg/dL (ref 8.5–10.2)
CHLORIDE: 106 mmol/L (ref 96–111)
CO2 TOTAL: 23 mmol/L (ref 22–32)
CREATININE: 1.19 mg/dL (ref 0.62–1.27)
ESTIMATED GFR: >60 mL/min/{1.73_m2} (ref >60)
GLUCOSE: 107 mg/dL (ref 65–139)
POTASSIUM: 4.2 mmol/L (ref 3.5–5.1)
SODIUM: 138 mmol/L (ref 136–145)

## 2018-04-27 LAB — ECG 12-LEAD
Atrial Rate: 85 {beats}/min
Calculated P Axis: 36 degrees
Calculated R Axis: 121 degrees
Calculated T Axis: 21 degrees
PR Interval: 172 ms
QRS Duration: 106 ms
QT Interval: 384 ms
QTC Calculation: 456 ms
Ventricular rate: 85 {beats}/min

## 2018-04-27 LAB — POC BLOOD GLUCOSE (RESULTS)
GLUCOSE, POC: 118 mg/dL — ABNORMAL HIGH (ref 70–105)
GLUCOSE, POC: 85 mg/dL (ref 70–105)

## 2018-04-27 SURGERY — CORONARY ANGIOGRAPHY W/LEFT HEART CATH W/WO LVG
Anesthesia: IV Sedation

## 2018-04-27 MED ORDER — IOVERSOL 320 MG IODINE/ML INTRAVENOUS SOLUTION
INTRAVENOUS | Status: AC
Start: 2018-04-27 — End: 2018-04-27
  Filled 2018-04-27: qty 100

## 2018-04-27 MED ORDER — IOVERSOL 320 MG IODINE/ML INTRAVENOUS SOLUTION
INTRAVENOUS | Status: AC
Start: 2018-04-27 — End: 2018-04-27
  Filled 2018-04-27: qty 50

## 2018-04-27 MED ORDER — LIDOCAINE HCL 20 MG/ML (2 %) INJECTION SOLUTION
Freq: Once | INTRAMUSCULAR | Status: DC | PRN
Start: 2018-04-27 — End: 2018-04-27
  Administered 2018-04-27: 5 mL via INTRADERMAL

## 2018-04-27 MED ORDER — HEPARIN (PORCINE) 1,000 UNIT/ML INJECTION SOLUTION
INTRAMUSCULAR | Status: AC
Start: 2018-04-27 — End: 2018-04-27
  Filled 2018-04-27: qty 10

## 2018-04-27 MED ORDER — VERAPAMIL 2.5 MG/ML INTRAVENOUS SOLUTION
Freq: Once | INTRAVENOUS | Status: DC | PRN
Start: 2018-04-27 — End: 2018-04-27
  Administered 2018-04-27 (×2): 5 mg via INTRA_ARTERIAL

## 2018-04-27 MED ORDER — MIDAZOLAM 1 MG/ML INJECTION SOLUTION
INTRAMUSCULAR | Status: AC
Start: 2018-04-27 — End: 2018-04-27
  Filled 2018-04-27: qty 4

## 2018-04-27 MED ORDER — HEPARIN (PORCINE) 1,000 UNIT/ML INJECTION SOLUTION
Freq: Once | INTRAMUSCULAR | Status: DC | PRN
Start: 2018-04-27 — End: 2018-04-27
  Administered 2018-04-27: 2000 [IU] via INTRA_ARTERIAL

## 2018-04-27 MED ORDER — FENTANYL (PF) 50 MCG/ML INJECTION SOLUTION
INTRAMUSCULAR | Status: AC
Start: 2018-04-27 — End: 2018-04-27
  Filled 2018-04-27: qty 2

## 2018-04-27 MED ORDER — NITROGLYCERIN 12.5 MG IN NS 250 ML
Freq: Once | Status: DC | PRN
Start: 2018-04-27 — End: 2018-04-27
  Administered 2018-04-27 (×2): 100 ug via INTRA_ARTERIAL

## 2018-04-27 MED ORDER — ATORVASTATIN 40 MG TABLET
80.00 mg | ORAL_TABLET | Freq: Every evening | ORAL | 0 refills | Status: AC
Start: 2018-04-27 — End: 2020-01-20

## 2018-04-27 MED ORDER — LIDOCAINE HCL 20 MG/ML (2 %) INJECTION SOLUTION
INTRAMUSCULAR | Status: AC
Start: 2018-04-27 — End: 2018-04-27
  Filled 2018-04-27: qty 20

## 2018-04-27 MED ORDER — MIDAZOLAM 1 MG/ML INJECTION SOLUTION
Freq: Once | INTRAMUSCULAR | Status: DC | PRN
Start: 2018-04-27 — End: 2018-04-27
  Administered 2018-04-27 (×4): 1 mg via INTRAVENOUS

## 2018-04-27 MED ORDER — HEPARIN (PORCINE) (PF) 1,000 UNIT/500 ML IN 0.9 % SODIUM CHLORIDE IV
INTRAVENOUS | Status: AC
Start: 2018-04-27 — End: 2018-04-27
  Filled 2018-04-27: qty 2000

## 2018-04-27 MED ORDER — IOVERSOL 320 MG IODINE/ML INTRAVENOUS SOLUTION
Freq: Once | INTRAVENOUS | Status: DC | PRN
Start: 2018-04-27 — End: 2018-04-27
  Administered 2018-04-27: 150 mL via INTRACORONARY

## 2018-04-27 MED ORDER — FENTANYL (PF) 50 MCG/ML INJECTION SOLUTION
Freq: Once | INTRAMUSCULAR | Status: DC | PRN
Start: 2018-04-27 — End: 2018-04-27
  Administered 2018-04-27 (×4): 25 ug via INTRAVENOUS

## 2018-04-27 MED ORDER — IOVERSOL 320 MG IODINE/ML INTRAVENOUS SOLUTION
INTRAVENOUS | Status: AC
Start: 2018-04-27 — End: 2018-04-27
  Filled 2018-04-27: qty 150

## 2018-04-27 MED ORDER — VERAPAMIL 2.5 MG/ML INTRAVENOUS SOLUTION
INTRAVENOUS | Status: AC
Start: 2018-04-27 — End: 2018-04-27
  Filled 2018-04-27: qty 2

## 2018-04-27 MED ADMIN — mupirocin 2 % topical ointment: INTRAVENOUS | @ 09:00:00 | NDC 45802011222

## 2018-04-27 SURGICAL SUPPLY — 21 items
CATH ANGIO 5FR FL3.5 CURVE 100CM EXPO FEM TRILON (DIAGNOSTIC) ×1
CATH ANGIO 5FR FL3.5 CURVE 100_CM EXPO FULL LGTH WRE BRD RBST (DIAGNOSTIC) ×1
CATH ANGIO 5FR IM CURVE 100CM EXPO WRE BRD RBST SHAFT COR TRILON (DIAGNOSTIC) ×1 IMPLANT
CATH ANGIO 5FR IM CURVE 100CM_EXPO WRE BRD RBST SHFT COR (DIAGNOSTIC) ×1
CATH ANGIO 5FR RADIAL TIG 4 CURVE 100CM OPTITORQUE LRG LUM SH 2 BRD SFT TIP COR SS NYL POLYUR (CARDIAC) ×1 IMPLANT
CATH ANGIO 5FR RADL TIG 4 CURV_E 100CM OPTITORQUE LRG LUM SH (CARDIAC) ×1
DEVICE COMPRESS TR BAND 29CM 2 BAL TRNSPR ADJ STRAP AIR TRTN LRG RADIAL ART (BALLOON) ×1 IMPLANT
DEVICE COMPRESS TR BAND 29CM 2_BAL TRNSPR ADJ STRAP AIR TRTN (BALLOON) ×1
DISCONTINUED NO SUB - GW .035IN 260CM FIX COR VAS 3MM J CURVE STRL LF  DISP ANGIO (WIRE) ×1 IMPLANT
DISCONTINUED USE 327881 - CATH ANGIO 5FR FL3.5 CURVE 100CM EXPO FEM TRILON (DIAGNOSTIC) ×1 IMPLANT
DISCONTINUED USE 338553 - PACK SURG ANGIO STRL DISP LTX (CARDIAC) ×1 IMPLANT
ELECTRODE DEFIBR QCMBO 59-95F TEMP CONTROL ADULT (Electrical Supplies) ×1 IMPLANT
ELECTRODE DEFIBR RD 24IN EDGE_SYS QCMB LEADWIRE ADLT LP12 LF (Electrical Supplies) ×1
GW .035IN 6CM 260CM STD TIP FI_COR EXCH WRE PTFE VAS 3MM (WIRE) ×1
GW GLDWR .035IN 3CM 260CM FLXB ATRAUMA TIP RADOPQ KINK RST NITINOL TUNG POLYUR HDRPH VAS 1.5MM J (WIRE) ×1 IMPLANT
GW GLDWR .035IN 3CM 260CM FLXB_ATRAUMA TIP RADOPQ KINK RST (WIRE) ×1
KIT INTROD 10CM 6FR 22GA GLIDESHEATH SLNDR .021IN PLASTIC SHEATH DIL 2 WL PNCT SHORT ANG MINIWIRE 45 (GUIDING) ×1 IMPLANT
KIT INTROD 10CM 6FR 22GA GLIDE_SHEATH SLNDR .021IN PLASTIC (GUIDING) ×1
KIT LEFT HEART MANIFOLD 4 PORT (MISCELLANEOUS PT CARE ITEMS) ×1
KIT SURG LFT HRT STRL DISP RUBY MEM LF (MISCELLANEOUS PT CARE ITEMS) ×1 IMPLANT
PACK CATHETERIZATION CUSTOM_5EA/CS (CARDIAC) ×1

## 2018-04-27 NOTE — Nurses Notes (Signed)
04/27/18 1130 04/27/18 1145 04/27/18 1200   Post-Procedure Checks   TR TRW [employer] Air (Amt. removed) 3 ml 3 ml 4 ml   TR Band Removed (Time)  --   --   --       04/27/18 1215 04/27/18 1230   Post-Procedure Checks   TR Band Air (Amt. removed) 3 ml  --    TR Band Removed (Time)  --  1230     Explained to patient the process of TR band removal.  Patient verbalized understanding.  TR band removed completely at 1230 and transparent dressing applied with 4x4 gauze.  Explained to patient to restrict use of his arm/wrist for 3 days.  Vital signs remain stable.  Will continue to monitor patient.

## 2018-04-27 NOTE — Care Management Notes (Signed)
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 Anticipated d/c home with no needs. Will continue to follow.     Discharge Plan:  Home (Patient/Family Member/other) (code 1)    The patient will continue to be evaluated for developing discharge needs.     Case Manager: Valetta Fuller, SOCIAL WORKER  Phone: [phone]

## 2018-04-27 NOTE — Progress Notes (Signed)
[facility]  Med Hospitalist HVI Reference Unit Progress Note      [name] [name]  Date of service: 04/27/2018  Date of Admission:  04/26/2018    Hospital Day:  LOS: 1 day   Subjective:   No acute events overnight  Denies CP or SOB  Patient was seen after cardiac cath, he was looking forward to get discharged today.    Vital Signs:  Temp  Avg: 36.9 C (98.4 F)  Min: 36.6 C (97.9 F)  Max: 37.1 C (98.7 F)    Pulse  Avg: 90.3  Min: 70  Max: 106 BP  Min: 109/72  Max: 138/88   Resp  Avg: 15.5  Min: 13  Max: 19 SpO2  Avg: 96.8 %  Min: 96 %  Max: 98 %   Pain Score (Numeric, Faces): 0      Input/Output    Intake/Output Summary (Last 24 hours) at 04/27/2018 1149  Last data filed at 04/27/2018 1000  Gross per 24 hour   Intake 880 ml   Output 1240 ml   Net -360 ml    I/O last shift:  11/08 0700 - 11/08 1859  In: 400 [P.O.:400]  Out: 15 [Intra-Op Fluid:15]       Current Facility-Administered Medications:  albuterol 90 mcg per inhalation oral inhaler - "Respiratory to administer" 2 Puff Inhalation Q4H PRN   aspirin chewable tablet 81 mg 81 mg Oral Daily   atorvastatin (LIPITOR) tablet 40 mg Oral QPM   budesonide-formoterol (SYMBICORT) 80 mcg-4.5 mcg per inhalation oral inhalaer - "Nursing to administer" 2 Puff Inhalation 2x/day   carvedilol (COREG) tablet 3.125 mg Oral 2x/day-Food   clopidogrel (PLAVIX) 75 mg tablet 75 mg Oral Daily   famotidine (PEPCID) tablet 20 mg Oral 2x/day   furosemide (LASIX) tablet 20 mg Oral Daily   heparin 5,000 unit/mL injection 5,000 Units Subcutaneous Q8HRS   ipratropium-albuterol 0.5 mg-3 mg(2.5 mg base)/3 mL Solution for Nebulization 3 mL Nebulization 3x/day   melatonin tablet 3 mg Oral QPM   montelukast (SINGULAIR) 10 mg tablet 10 mg Oral QPM   nitroGLYCERIN (NITROSTAT) sublingual tablet 0.4 mg Sublingual Q5 Min PRN   SSIP insulin lispro (HUMALOG) 100 units/mL injection 0-12 Units Subcutaneous Q4H PRN       Physical Exam:  Constitutional:  appears stated age, moderately obese, and no  distress  Eyes:  Conjunctiva clear.  ENT:  MMM  Respiratory:  Clear to auscultation bilaterally. No wheezes, rales, or rhonchi  Cardiovascular:  regular rate and rhythm, S1, S2 normal, no murmur, click, rub or gallop. No edema   Gastrointestinal:  Soft, non-tender, Bowel sounds normal, non-distended  Musculoskeletal:  Head atraumatic and normocephalic and AROM  Integumentary:  Skin warm and dry, No rashes. Well healed sternal scar  Neurologic:  Grossly normal, Alert and oriented x3  Psychiatric:  Normal, pleasant    Labs:  CBC Results Differential Results   No results found for this or any previous visit (from the past 30 hour(s)). No results found for this or any previous visit (from the past 30 hour(s)).   BMP Results Other Chemistries Results   Results for orders placed or performed during the hospital encounter of 04/26/18 (from the past 30 hour(s))   BASIC METABOLIC PANEL    Collection Time: 04/27/18  4:16 AM   Result Value    SODIUM 138    POTASSIUM 4.2    CHLORIDE 106    CO2 TOTAL 23    GLUCOSE 107  BUN 13    CREATININE 1.19    No results found for this or any previous visit (from the past 30 hour(s)).   Liver/Pancreas Enzyme Results Liver Function Results   No results found for this or any previous visit (from the past 30 hour(s)). No results found for this or any previous visit (from the past 30 hour(s)).   Cardiac Results Coags Results   No results found for this or any previous visit (from the past 30 hour(s)). No results found for this or any previous visit (from the past 30 hour(s)).       Radiology:     CXR with trace L pleural effusion per my review     PT/OT: No    Consults: cardiology     Hardware (lines, foley's, tubes): PIV    Assessment/ Plan:   Active Hospital Problems    Diagnosis   . Primary Problem: Unstable angina (CMS HCC)   . 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 male with PMH CAD s/p CABG x1 (2018), HTN, HLD, GERD, OSA, prior  polysubstance abuse, and obesity who was transferred from [facility] where he presented with chest pain.    Chest pain in setting of known CAD s/p CABG x 1 (2018), concern for unstable angina   - EKG showed no ST segment changes   - troponins negative at [facility] and here    - CXR with trace L pleural effusion per my review    - TTE ordered    - continue ASA, Plavix, Lipitor, Coreg    - Nitro PRN   - cardiology consulted, s/p cardiac cath- unremarkable   - Recommended intensive medical therapy      HTN  HLD   - continue meds as above    GERD   - resume H2 antagonist     Obesity   - BMI 37   - pt counseled > 3 minutes on diet/weight loss    - outpatient f/u with PCP for continued diet/weight loss counseling     OSA   - CPAP QHS   - pt counseled > 3 minutes on diet/weight loss     DVT/PE Prophylaxis: Heparin    Disposition Planning: Home discharge      Approximately > 30 minutes were spend in reviewing the chart, labs, images and medications.       Marlaine Hind  Assistant Professor Medicine  Pager No. [phone]

## 2018-04-27 NOTE — Brief Op Note (Signed)
Procedure:  1. Selective coronary angiogram.  2. Bypass graft study    Indication:  Unstable angina    Accesse:  Left radial artery 6 French closed with TR band.    Findings and conclusion:  1. Totally occluded mid LAD, with widely patent LIMA to LAD.  2. No significant coronary artery disease otherwise.  3. Recommend aggressive cardiac risk factor modification and medical measures coronary artery disease.    Judy Pimple, MD.  Structural Heart and Interventional Cardiology Fellow.  [facility].  Cell: [phone].          Jodi Geralds, MD  04/27/2018, 11:08

## 2018-04-27 NOTE — Sedation Documentation (Signed)
04/27/18  Procedure(s):  CORONARY ANGIOGRAPHY W/LEFT HEART CATH W/WO LVG    Diagnosis:     Sedation Informed Consent, pre-sedation risk assessment and evaluation completed.  History of previous adverse experiences with sedation/analgesia/anesthesia assessed.  Monitored conscious sedation was administered under my direct supervision by an appropriately trained sedation nurse.  Appropriate Facility and Equipment compliant.      Procedure time out  Timeouts     Yevonne Aline, RN at [facility] Apr 27, 2018 6045     Timeout Details     Timeout type:  Pre-procedure          Procedures     Panel 1: CORONARY ANGIOGRAPHY with Jodi Geralds, MD          Timeout Questions    Correct patient? Yes  Correct site? Yes  Correct side? Yes  Correct position? Yes  Correct procedure? Yes  Site marked? N/A  H&P note completed? Yes  Consents verified? Yes  Radiology studies available? Yes  Relevant lab results available? Yes  Allergies reviewed? Yes  Are all required blood products & devices for the procedure available? Yes  Is documentation verified? Yes           Staff Present     Physicians  Jodi Geralds, MD Staff  Emmit Alexanders, RN  Emmit Pomfret, [facility]  Drusilla Kanner, RT (Clovis Cao, RN          Verification History     Staff Performed Verified    Yevonne Aline, RN [facility] Apr 27, 2018 4098 Fri Apr 27, 2018 1191                      Physician in  and out times  Physicians     Name Panel Role Time Period    Jodi Geralds, MD Panel 1 Primary 04/27/2018 4782    Judy Pimple, MD Panel 1 Fellow 04/27/2018 0900      Sedation Staff     Name Type Time Period    Emmit Alexanders, RN Invasive Nurse 04/27/2018 0729    Yevonne Aline, RN Invasive Nurse 04/27/2018 0729          Sedation and Procedure Times:  Sedation Start Time:: 0835  Sedation End/Recovery Start Time: 0910     Proc Name Event Type Event Time    CORONARY ANGIOGRAPHY W/LEFT HEART CATH W/WO LVG  Incision Start Fri Apr 27, 2018  8:34 AM    CORONARY ANGIOGRAPHY  W/LEFT HEART CATH W/WO LVG  Incision Close           Aldrete Scores    Pre Sedation  Activity: 2-->able to move 4 extremities voluntarily or on command  Respiration: 2-->able to breathe and cough freely  Circulation: 2-->BP within 20% of pre-anesthetic level  Consciousness: 2-->fully awake  O2 Saturation: 2-->able to maintain O2 saturation greater than 92% on room air  Dressing: 2-->dry and clean or not applicable  Pain: 2-->pain free  Ambulation: 2-->able to stand up and walk straight, on ordered bedrest, or performing at previous level of functioning  Fasting/Feeding: 2-->able to drink fluids or NPO, minimal nausea/ no vomiting  Urine Output: 2-->has voided, adequate urine output per device, or not applicable  Modified Aldrete Score: 20      Post Sedation  Assessment Scored:: Post-Procedure  Activity: 2-->able to move 4 extremities voluntarily or on command  Respiration: 2-->able to breathe and cough freely  Circulation: 2-->BP within 20% of pre-anesthetic level  Consciousness: 1-->arousable on calling  O2 Saturation: 2-->able to maintain O2 saturation greater than 92% on room air  Dressing: 2-->dry and clean or not applicable  Pain: 2-->pain free  Ambulation: 2-->able to stand up and walk straight, on ordered bedrest, or performing at previous level of functioning  Urine Output: 2-->has voided, adequate urine output per device, or not applicable  Post Modified Aldrete Score: 19      Sedation Type: Moderate Sedation     Medications (moderate): Fentanyl, Versed  Hospital: [facility]  Unit: HVI  IV Type: Peripheral IV  Additional Intervention needed:: No             Patient was continuously monitored throughout the procedure.  Provider was in attendance throughout sedation.  See Invasive Procedure Log for additional details.    Judy Pimple, MD             The patient was continuously monitored throughout the procedure and in recovery. I was in attendance and supervised the sedation (during the start and stop  times listed above) and remained immediately available until the patient returned to pre-procedure baseline.     Jodi Geralds, MD  04/27/2018, 11:08

## 2018-04-27 NOTE — Discharge Summary (Signed)
[facility]  DISCHARGE SUMMARY    PATIENT NAME:  [name] [name], [name] [name]  MRN:  [id number]  DOB:      ENCOUNTER DATE:  04/26/2018  INPATIENT ADMISSION DATE: 04/26/2018  DISCHARGE DATE:  04/27/2018    ATTENDING PHYSICIAN: Marlaine Hind, MD  SERVICE: HOSPITALIST 8 CARDIAC REFERENCE UNIT  PRIMARY CARE PHYSICIAN: Randol Kern, PA-C     Has PCP been verified with patient and updated? Yes    LAY CAREGIVER:  ,  ,        PRIMARY DISCHARGE DIAGNOSIS: Unstable angina (CMS [facility])  Active Hospital Problems    Diagnosis Date Noted   . Principle Problem: Unstable angina (CMS HCC) [I20.0] 09/06/2016   . 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     Active Non-Hospital Problems    Diagnosis Date Noted   . Hypokalemia 09/07/2016   . Chest pain 04/22/2016   . CAD (coronary artery disease) 01/25/2013        DISCHARGE MEDICATIONS:     Current Discharge Medication List      CONTINUE these medications which have CHANGED during your visit.      Details   atorvastatin 40 mg Tablet  Commonly known as:  LIPITOR  What changed:     medication strength   how much to take   80 mg, Oral, EVERY EVENING  Qty:  180 Tab  Refills:  0        CONTINUE these medications - NO CHANGES were made during your visit.      Details   aspirin 81 mg Tablet, Chewable   81 mg, Oral, DAILY  Qty:  60 Tab  Refills:  2     clopidogrel 75 mg Tablet  Commonly known as:  PLAVIX   TAKE 1 TABLET BY MOUTH ONCE DAILY  Qty:  90 Tab  Refills:  0     fluticasone furoate-vilanterol 100-25 mcg/dose Disk with Device  Commonly known as:  BREO ELLIPTA   1 INHALATION, Inhalation, DAILY  Refills:  0     furosemide 20 mg Tablet  Commonly known as:  LASIX   20 mg, Oral, DAILY  Refills:  0     ipratropium-albuterol 0.5 mg-3 mg(2.5 mg base)/3 mL Solution for Nebulization  Commonly known as:  DUONEB   3 mL, Nebulization, 4 TIMES DAILY  Refills:  0     melatonin 3  mg Tablet   3 mg, Oral, NIGHTLY PRN  Qty:  30 Tab  Refills:  0     metoprolol tartrate 50 mg Tablet  Commonly known as:  LOPRESSOR   75 mg, Oral, 2 TIMES DAILY  Qty:  90 Tab  Refills:  5     montelukast 10 mg Tablet  Commonly known as:  SINGULAIR   10 mg, Oral, EVERY EVENING  Refills:  0     nitroGLYCERIN 0.4 mg Tablet, Sublingual  Commonly known as:  NITROSTAT   0.4 mg, EVERY 5 MIN PRN  Refills:  0     raNITIdine 150 mg Tablet  Commonly known as:  ZANTAC   150 mg, Oral, 2 TIMES DAILY  Refills:  0     VENTOLIN HFA 90 mcg/actuation HFA Aerosol Inhaler  Generic drug:  albuterol sulfate   No dose, route, or frequency recorded.  Refills:  0          Discharge med  list refreshed?  YES    CARDIOVASCULAR MEDICATIONS:  ASA: Yes  Statin: Yes  Antiplatelets (Plavix, Brilinta, Effient): Yes  Anticoagulants (Coumadin, Xarelto, Eliquis): No. Reason: not indicated  Beta Blocker:  Yes  ACEI/ARB/ARNI:  No, Not indicated  Spironolactone Indicated (Heart Failure): Not indicated.      During this hospitalization did the patient have an AMI, PCI/PCTA, STENT or Isolated CABG?  No              Heart Failure Discharge Labs  First admission (Creatinine): 60  Discharge Creatinine: Lab Results     Component                Value               Date                     CREATININE               1.19                04/27/2018               CREATININE               1.1                 04/30/2013            First admission (BUN)(BUN/Creatinine ration):   Discharge: BUN     Date                     Value               Ref Range           Status              04/27/2018               13                  8 - 25 mg/dL        Final               04/30/2013               17                  8 - 20 mg/dL        Final          ----------  Warfarin: no    ALLERGIES:  Allergies   Allergen Reactions   . Phenergan [Promethazine] Seizure         Weight:   Admit:  Weight: 134.2 kg (295 lb 13.7 oz) Discharge: Wt Readings from Last 1 Encounters:04/26/18 : 134.2 kg  (295 lb 13.7 oz)         HOSPITAL PROCEDURE(S):   Bedside Procedures:  No orders of the defined types were placed in this encounter.    Surgical Procedure(s):  CORONARY ANGIOGRAPHY W/LEFT HEART CATH W/WO LVG    REASON FOR HOSPITALIZATION AND HOSPITAL COURSE     BRIEF HPI: [name] [name] is a 52 y.o., White male with pmhx significant for CAD s/p CABGx1 (2018), hx of polysubstance abuse in remission since 1994, presents to [place] with several hour history of chest pain. He was started initially on medical management. He underwent cardiac cath on 11/8 which did not show any remarkable changes. Patient was discharged on maximum medical therapy.  CONDITION ON DISCHARGE:  A. Ambulation: Full ambulation  B. Self-care Ability: Complete  C. Cognitive Status Alert  D. Code status at discharge:   Code Status Information     Code Status    Full Code                 LINES/DRAINS/WOUNDS AT DISCHARGE:   Patient Lines/Drains/Airways Status    Active Line / Dialysis Catheter / Dialysis Graft / Drain / Airway / Wound     Name: Placement date: Placement time: Site: Days:    Peripheral IV Left;Posterior Dorsal Metacarpals  (top of hand)  --   --       Peripheral IV Anterior;Distal;Left;Upper Arm;Median Cubital  (antecubital fossa)  04/26/18   1644   less than 1                DISCHARGE DISPOSITION:  Home discharge              DISCHARGE INSTRUCTIONS:  Follow-up Information     Lawernce Ion, DO On 05/03/2018.    Specialty:  EMERGENCY MEDICINE  Why:  Hospital discharge follow up at 10 am.  Contact information:  [facility] [place] [address]  [phone]                 No discharge procedures on file.       Marlaine Hind, MD      Copies sent to Care Team       Relationship Specialty Notifications Start End    Randol Kern, [place] PCP - General EXTERNAL  04/26/18     Phone: [phone] Fax: [phone]         [facility] [address] [place] [address]          Referring providers  can utilize [email or web address] to access their referred [facility] patient's information.

## 2018-04-27 NOTE — Care Plan (Signed)
Patient discharged home with family.  AVS reviewed with patient/care giver.  A written copy of the AVS and discharge instructions was given to the patient/care giver.  Questions sufficiently answered as needed.  Patient/care giver encouraged to follow up with PCP as indicated.  In the event of an emergency, patient/care giver instructed to call 911 or go to the nearest emergency room.     PIVs removed.  Education provided on patient right wrist and what to do if bleeding occurs and to restrict movement/lifting for 3 days.  Patient verbalized understanding.  Patient refused wheelchair and walked to the lobby for discharge.

## 2018-04-30 ENCOUNTER — Telehealth (HOSPITAL_COMMUNITY): Payer: Self-pay

## 2018-04-30 NOTE — Care Management Notes (Signed)
04/30/18 1136   Date and Time of Coordination Contact   Date 04/30/18   Time 1134   Patient Contact   Contact made via Phone   Spoke with Patient   Call Outcome Left message   Additional Interventions   Total # Outreaches 1   Approx. Time Spent (min.) 0-5   [facility], LPN  84/69/6295, 11:36

## 2018-04-30 NOTE — Care Management Notes (Signed)
04/30/18 1400   Date and Time of Coordination Contact   Date 04/30/18   Time 1356   Patient Contact   Contact made via Phone   Spoke with Patient   Call Outcome Completed   Medications   Reviewed Med Chngs/Adds/Dele Yes   Medications obtained Yes   Med adher/comp concerns addressed Yes   Adverse Effects No   Clinical Needs   Pt/Caregiver aware Reason for Hosp Yes, reinforced   Pt/Caregiver Aware Reasons Causing Return to [facility] Yes, reinforced   Experiencing acute symptoms No   Hospital Discharge Follow Up Appointment   Reviewed PCP Follow-up Appt Appt scheduled   Appt Scheduled Plans to attend, has transportation   Ordered labs, tests, imaging, etc reviewed w/pt? No   Post Acute Services Ordered at Discharge   Reviewed Post-Acute Agency/Service Orders Not applicable   Additional Interventions   PCP PCP verified   System/External PCP External   Care Coordination Exchange PCP   Total # Outreaches 2   Approx. Time Spent (min.) 6-10   [facility] [place], [place]  91/47/8295, 14:02

## 2018-05-01 NOTE — Ancillary Notes (Signed)
Utilization Review Determination    SECTION I  Reason for Physician Advisor Referral: Does not meet screening criteria for admission. 04/26/18    Current MERLIN MD Order: inpatient    Utilization Review Findings: Patient meets observation status.  Utilization Review MD: Dr. Silvio ClaymanKaren [name]    Based on clinical information available to me as per above and in chart, the patient's status should be: Observation    [name] ScotlandPaul S [name], Utilization Review RN 05/01/2018, 12:02  -------------------------------------------------------------------------------------------------------------------  1.  I have reviewed this case with the patient's treating physician [name] [name], APP and the APP agrees with this change in status.  They are aware of the referral to the Utilization Review Committee.    2.  The patient has not been notified because of discharge on 04/27/18 of changes in level of care.      [name] ScotlandPaul S [name], Utilization Review RN 05/01/2018, 12:02  (Patient notification is required only if the status is changed while an Inpatient to Observation Status)  -------------------------------------------------------------------------------------------------------------------

## 2018-05-22 ENCOUNTER — Encounter (HOSPITAL_COMMUNITY): Payer: Self-pay | Admitting: Physician Assistant

## 2018-08-04 ENCOUNTER — Other Ambulatory Visit (HOSPITAL_COMMUNITY): Payer: Self-pay | Admitting: Family

## 2018-10-08 ENCOUNTER — Other Ambulatory Visit: Payer: Self-pay

## 2018-10-08 ENCOUNTER — Ambulatory Visit: Payer: No Typology Code available for payment source | Attending: Internal Medicine | Admitting: Internal Medicine

## 2018-10-08 DIAGNOSIS — J45909 Unspecified asthma, uncomplicated: Secondary | ICD-10-CM

## 2018-10-08 DIAGNOSIS — R059 Cough, unspecified: Secondary | ICD-10-CM

## 2018-10-08 DIAGNOSIS — J449 Chronic obstructive pulmonary disease, unspecified: Secondary | ICD-10-CM | POA: Insufficient documentation

## 2018-10-08 DIAGNOSIS — I2510 Atherosclerotic heart disease of native coronary artery without angina pectoris: Secondary | ICD-10-CM

## 2018-10-08 DIAGNOSIS — Z87891 Personal history of nicotine dependence: Secondary | ICD-10-CM

## 2018-10-08 DIAGNOSIS — R05 Cough: Secondary | ICD-10-CM

## 2018-10-08 DIAGNOSIS — Z951 Presence of aortocoronary bypass graft: Secondary | ICD-10-CM

## 2018-10-08 MED ORDER — BREO ELLIPTA 200 MCG-25 MCG/DOSE POWDER FOR INHALATION
1.0000 | DISK | Freq: Every day | RESPIRATORY_TRACT | 4 refills | Status: AC
Start: 2018-10-08 — End: 2019-10-08

## 2018-10-08 NOTE — H&P (Signed)
Pulmonary/Critical Care/Sleep  [address]  [place] [address]  Phone: [phone]  Fax: [phone]    NAME: [name] [name]  DOB:    AGE:  53 y.o.  MRN:  [id number]  APPT:  10/08/2018  3:15 PM EDT    Primary Care Physician: Randol Kern, PA-C   Referring Physician: Randol Kern, [facility] [place], [place] [address]    TELEMEDICINE DOCUMENTATION:  Patient Location: Home address: [address]  [place] [address]   Patient/family aware of provider location:  yes  Patient/family consent for telemedicine:  yes  Examination observed and performed by:  N/A   Method of communication:  E-visit  with video call  Patient visit:  New       CHIEF COMPLAINT:  Evaluation of asthma    History of Present Illness   [name] [name] is a 53 y.o. male  who was contacted today for evaluation of asthma.  The patient was diagnosed with asthma at age of 22 however he used to smoke 1 pack per day for 10 years and quit smoking 26 years ago.  The patient is currently using Breo DuoNeb albuterol and Singulair.  He reports worsening symptoms with wheezing chest tightness and cough triggered by exposure to cold shock let dust and season changes.  Patient has sleep apnea is currently using his CPAP.  He also has acid reflux with uncontrolled symptoms contributing to worsening cough.  The patient denies any current dyspnea at rest but has with moderate activity.  No history of occupational asthma.  No current chest pain or bilateral lower extremity edema.  The patient had a CABG done in March 2018.  ABG done at that.  Showed a pH of 7.4 with pCO2 of 40     REVIEW OF SYSTEMS:    Cough    Other than ROS in the HPI, all other systems were negative.    Past Medical History:  Past Medical History:   Diagnosis Date   . Asthma    . Coronary artery disease    . CPAP (continuous positive airway pressure) dependence    . Esophageal reflux    . Hypercholesterolemia    . Hypertension    . Knee injury    . Sleep apnea                Surgical history:  Past Surgical History:   Procedure Laterality Date   . CORONARY ARTERY ANGIOPLASTY     . HX DENTAL EXTRACTION     . HX STENTING (ANY)  06/15/2011   . HX TONSIL AND ADENOIDECTOMY           Social history:  He reports that he quit smoking about 26 years ago. He quit after 10.00 years of use. He has never used smokeless tobacco. He reports that he does not drink alcohol or use drugs.  Family history:  His family history includes Coronary Artery Disease in his father and mother.    Allergies:  Phenergan [promethazine]    Medications:  Current Outpatient Medications   Medication Sig   . aspirin 81 mg Oral Tablet, Chewable Take 1 Tab (81 mg total) by mouth Once a day   . atorvastatin (LIPITOR) 40 mg Oral Tablet Take 2 Tabs (80 mg total) by mouth Every evening for 90 days   . clopidogreL (PLAVIX) 75 mg Oral Tablet TAKE 1 TABLET BY MOUTH ONCE DAILY   . Famotidine (PEPCID) 10 mg Oral  Tablet Take 10 mg by mouth Twice daily   . fluticasone-vilanterol 100-25 mcg/dose Inhalation Disk with Device Take 1 INHALATION by inhalation Once a day    . furosemide (LASIX) 20 mg Oral Tablet Take 20 mg by mouth Once a day   . ipratropium-albuterol 0.5 mg-3 mg(2.5 mg base)/3 mL Solution for Nebulization 3 mL by Nebulization route Four times a day   . melatonin 3 mg Oral Tablet Take 1 Tab (3 mg total) by mouth Every night as needed   . metoprolol tartrate (LOPRESSOR) 50 mg Oral Tablet Take 1.5 Tabs (75 mg total) by mouth Twice daily   . montelukast (SINGULAIR) 10 mg Oral Tablet Take 10 mg by mouth Every evening   . nitroglycerin (NITROSTAT) 0.4 mg Sublingual Tablet, Sublingual 0.4 mg by Sublingual route Every 5 minutes as needed for Chest pain for 3 doses over 15 minutes   . pantoprazole (PROTONIX) 20 mg Oral Tablet, Delayed Release (E.C.) Take 20 mg by mouth Every morning before breakfast   . ranitidine (ZANTAC) 150 mg Oral Tablet Take 150 mg by mouth Twice daily   . VENTOLIN HFA 90 mcg/actuation Inhalation HFA  Aerosol Inhaler          PHYSICAL EXAM:   There were no vitals taken for this visit.    GS: Patient is in no acute distress, oriented   HEENT: Non icteric sclera,  normal neck range of motion.  normocephalic  Respiratory: No audible stridor, normal effort No current distress   Psychiatric: Speech is appropriate with regular rate and rhythm. Stream of thought is spontaneous. Patient does not exhibit abnormal or psychotic thoughts.   SKIN The facial skin has no visible rash      Labs and Imaging   Lab Results   Component Value Date    WBC 6.1 04/27/2018    RBC 5.16 04/27/2018    HGB 15.1 04/27/2018    HCT 44.4 04/27/2018    MCV 86.0 04/27/2018    MCH 29.3 04/27/2018    MCHC 34.0 04/27/2018    RDW 15.2 (H) 09/16/2016    MPV 10.5 04/27/2018      Lab Results   Component Value Date    BUN 13 04/27/2018    CREATININE 1.19 04/27/2018    CO2 23 04/27/2018    CALCIUM 8.7 04/27/2018    ALKPHOS 83 04/26/2018    AST 18 04/26/2018    ALT 18 04/26/2018    BNP 13 09/06/2016             ASSESSMENT AND PLAN     ENCOUNTER DIAGNOSES     ICD-10-CM   1. Asthma J45.909   2. Ex-smoker Z87.891   3. Chronic obstructive pulmonary disease, unspecified COPD type (CMS HCC) J44.9   4. Cough R05   5. CAD (coronary artery disease) I25.10   6. S/P CABG (coronary artery bypass graft) Z95.1     Orders Placed This Encounter   . CXR PA & Lat   . CXR PA & Lat   . TOTAL IMMUNOGLOBULIN IGE   . RAST, ALTERNARIA ALTERNATA (M6)   . RAST, ASPERGILLUS FUMIGATUS (M3)   . RAST, COMMON SILVER BIRCH (T3)   . RAST, [place] GRASS (G2)   . RAST, COCKLEBUR (W13)   . RAST, COCKROACH, GERMAN (I6)   . RAST, CLADOSPORIUM HERBARUM (M2)   . RAST, COMMON RAGWEED (W1)   . RAST, COTTONWOOD POPLAR (T14)   . RAST, CAT DANDER (E1)   . RAST, HOUSE DUST MITE (D2) (  DERMATOPHAGOIDES FARINAE)   . RAST, ELM (T8)   . RAST, PLANTAIN, ENGLISH (W9)   . RAST, JOHNSON GRASS (G10)   . RAST, MEADOW GRASS ([place] BLUE) (G8)   . RAST, GOOSE FOOT (LAMB'S QUARTER) (W10)   . RAST, BOX-ELDER (T1)    . RAST, MUGWORT (W6)   . RAST, MOUSE EPITHELIUM (E71)   . RAST, OAK (T7)   . RAST, ORCHARD GRASS (COCKSFOOT) (G3)   . RAST, PENICILLIUM CHRYSOGENUM (M1)   . RAST, COMMON PIGWEED (W14)   . RAST, SHEEP SORREL (W18)   . RAST, TIMOTHY GRASS (G6)   . CBC/DIFF   . PULMONARY FUNCTION TESTING - ADULT   . fluticasone furoate-vilanteroL (BREO ELLIPTA) 200-25 mcg/dose Inhalation Disk with Device       Horton Finerodd A Kneale is a 53 y.o. male who was contacted today for re-evaluation of asthma  Increased Breo to 200 mcg.  Continue Singulair albuterol and DuoNeb  Proceed with the above allergy workup  Will get the PFT  Get CBC to assess for eosinophilia  Will get an x-ray  Continue follow-up with primary care and GI for his uncontrolled GERD   Follow-up in 10 months      -Pulmonary maintenance  Immunization History   Administered Date(s) Administered   . Influenza Vaccine IM (ADMIN) 04/26/2018     The patient was given the opportunity to ask questions which were addressed to the best of my ability. The patient voiced understanding in regards to the treatment plan and agreed to proceed.      Gust RungJimmy Daissy Yerian, MD 10/08/2018    This note may have been partially generated using MModal Fluency Direct system, and there may be some incorrect words, spellings, and punctuation that were not noted in checking the note before saving.

## 2018-11-16 ENCOUNTER — Ambulatory Visit
Admission: RE | Admit: 2018-11-16 | Discharge: 2018-11-16 | Disposition: A | Discharge status: Home / Self Care | Payer: MEDICAID | Source: Ambulatory Visit | Attending: Internal Medicine | Admitting: Internal Medicine

## 2018-11-16 ENCOUNTER — Other Ambulatory Visit: Payer: Self-pay

## 2018-11-16 ENCOUNTER — Ambulatory Visit (HOSPITAL_COMMUNITY): Payer: MEDICAID

## 2018-11-16 ENCOUNTER — Ambulatory Visit (HOSPITAL_COMMUNITY)
Admission: RE | Admit: 2018-11-16 | Discharge: 2018-11-16 | Disposition: A | Discharge status: Home / Self Care | Payer: MEDICAID | Source: Ambulatory Visit | Attending: Internal Medicine | Admitting: Internal Medicine

## 2018-11-16 DIAGNOSIS — J449 Chronic obstructive pulmonary disease, unspecified: Secondary | ICD-10-CM

## 2018-11-16 DIAGNOSIS — Z951 Presence of aortocoronary bypass graft: Secondary | ICD-10-CM

## 2018-11-16 DIAGNOSIS — R05 Cough: Secondary | ICD-10-CM | POA: Insufficient documentation

## 2018-11-16 DIAGNOSIS — J45909 Unspecified asthma, uncomplicated: Principal | ICD-10-CM

## 2018-11-16 DIAGNOSIS — I2510 Atherosclerotic heart disease of native coronary artery without angina pectoris: Secondary | ICD-10-CM

## 2018-11-16 DIAGNOSIS — Z87891 Personal history of nicotine dependence: Secondary | ICD-10-CM

## 2018-11-16 DIAGNOSIS — R059 Cough, unspecified: Secondary | ICD-10-CM

## 2018-11-16 LAB — CBC WITH DIFF
BASOPHIL #: 0.1 10*3/uL (ref 0.00–0.20)
BASOPHIL %: 1 %
EOSINOPHIL #: 0.1 10*3/uL (ref 0.00–0.50)
EOSINOPHIL %: 1 %
HCT: 47.8 % (ref 38.9–50.5)
HCT: 47.8 % (ref 38.9–50.5)
HGB: 16 g/dL (ref 13.4–17.3)
LYMPHOCYTE #: 2.5 10*3/uL (ref 0.80–3.20)
LYMPHOCYTE %: 27 %
MCH: 28.9 pg (ref 27.9–33.1)
MCHC: 33.5 g/dL (ref 32.8–36.0)
MCV: 86 fL (ref 82.4–95.0)
MONOCYTE #: 1 10*3/uL — ABNORMAL HIGH (ref 0.20–0.80)
MONOCYTE %: 11 %
MPV: 9.1 fL (ref 6.0–10.2)
NEUTROPHIL #: 5.4 10*3/uL (ref 1.60–5.50)
NEUTROPHIL %: 60 %
NEUTROPHIL %: 60 %
PLATELETS: 292 10*3/uL (ref 140–440)
RBC: 5.56 10*6/uL (ref 4.40–5.68)
RDW: 13.2 % (ref 10.9–15.1)
WBC: 9.1 10*3/uL (ref 3.3–9.3)

## 2018-11-19 LAB — IGE, TOTAL IMMUNOGLOBULIN E: IgE: 134 kU/L

## 2018-11-20 LAB — RAST, COMMON SILVER BIRCH (T3)
COMMON SILVER BIRCH (T3) INTERPRETATION: UNDETECTABLE
COMMON SILVER BIRCH (T3): <0.1 kU/L (ref <0.10)

## 2018-11-20 LAB — RAST, PENICILLIUM CHRYSOGENUM (M1)
PENICILLIUM CHRYSOGENUM (M1) INTERPRETATION: UNDETECTABLE
PENICILLIUM CHRYSOGENUM (M1): <0.1 kU/L (ref <0.10)

## 2018-11-20 LAB — RAST, ORCHARD GRASS (COCKSFOOT) (G3): ORCHARD GRASS (COCKSFOOT) (G3): 1.54 kU/L — ABNORMAL HIGH (ref <0.10)

## 2018-11-20 LAB — RAST, ELM (T8)
ELM (T8) INTERPRETATION: UNDETECTABLE
ELM (T8): <0.1 kU/L (ref <0.10)

## 2018-11-20 LAB — RAST, OAK (T7)
OAK (T7) INTERPRETATION: UNDETECTABLE
OAK (T7): <0.1 kU/L (ref <0.10)

## 2018-11-20 LAB — RAST, COTTONWOOD POPLAR (T14)
COTTONWOOD POPLAR (T14) INTERPRETATION: UNDETECTABLE
COTTONWOOD POPLAR (T14): <0.1 kU/L (ref <0.10)

## 2018-11-20 LAB — RAST, CAT DANDER (E1): CAT DANDER (E1): 11 kU/L — ABNORMAL HIGH (ref <0.10)

## 2018-11-20 LAB — RAST, TIMOTHY GRASS (G6): TIMOTHY GRASS (G6): 1.51 kU/L — ABNORMAL HIGH (ref <0.10)

## 2018-11-20 LAB — RAST, ALTERNARIA ALTERNATA (M6)
ALTERNARIA TENUIS (M6) INTERPRETATION: UNDETECTABLE
ALTERNARIA TENUIS (M6): <0.1 kU/L (ref <0.10)

## 2018-11-20 LAB — RAST, JOHNSON GRASS (G10)
JOHNSON GRASS (G10): 0.28 kU/L — ABNORMAL HIGH (ref <0.10)
JOHNSON GRASS (G10): 0.28 kU/L — ABNORMAL HIGH (ref <0.10)

## 2018-11-20 LAB — RAST, COCKROACH, GERMAN (I6): COCKROACH, GERMAN (I6): 0.11 kU/L — ABNORMAL HIGH (ref <0.10)

## 2018-11-20 LAB — RAST, BERMUDA GRASS (G2)
BERMUDA GRASS (G2): 0.13 kU/L — ABNORMAL HIGH (ref <0.10)
BERMUDA GRASS (G2): 0.13 kU/L — ABNORMAL HIGH (ref <0.10)

## 2018-11-20 LAB — RAST, HOUSE DUST MITE (D2) (DERMATOPHAGOIDES FARINAE): HOUSE DUST MITE (D2) (DERMATOPHAGOIDES FARINAE): 8.76 kU/L — ABNORMAL HIGH (ref <0.10)

## 2018-11-20 LAB — RAST, ASPERGILLUS FUMIGATUS (M3)
ASPERGILLUS FUMIGATUS (M3) INTERPRETATION: UNDETECTABLE
ASPERGILLUS FUMIGATUS (M3): <0.1 kU/L (ref <0.10)

## 2018-11-20 LAB — RAST, BOX-ELDER (T1)
BOX-ELDER (T1) INTERPRETATION: UNDETECTABLE
BOX-ELDER (T1): <0.1 kU/L (ref <0.10)

## 2018-11-20 LAB — RAST, MOUSE EPITHELIUM (E71)
MOUSE EPITHELIUM (E71) INTERPRETATION: UNDETECTABLE
MOUSE EPITHELIUM (E71): <0.1 kU/L (ref <0.10)

## 2018-11-20 LAB — RAST, MEADOW GRASS (KENTUCKY BLUE) (G8): MEADOW GRASS (KENTUCKY BLUE) (G8): 1.75 kU/L — ABNORMAL HIGH (ref <0.10)

## 2018-11-21 NOTE — Procedures (Signed)
Pulmonary/Critical Care/Sleep  [address]  [place] [address]  Phone: [phone]  Fax: [phone]    NAME: [name] [name]  DOB:    AGE:  53 y.o.  MRN:  [id number]        Pulmonary function tests    FVC:  95  %predicted  FEV1:    94%predicted  Ratio:    72  TLC:     101  %predicted  DLCO:  56  %predicted     Summaries.   The FVC, FEV1, and FEV1/FVC are normal.  There is no significant response to inhaled bronchodilator.  The FRC is normal.  The TLC is normal.  The diffusing capacity is decreased.     Interpretations.   The decreased diffusion capacity is suggestive of pulmonary vascular disease, anemia "occult" emphysema and/or early interstitial lung disease.     Please review the media scanned document for further details.    Gust Rung, MD

## 2019-02-28 ENCOUNTER — Other Ambulatory Visit (HOSPITAL_COMMUNITY): Payer: Self-pay | Admitting: Physician Assistant

## 2019-03-22 ENCOUNTER — Ambulatory Visit: Payer: MEDICAID | Attending: Physician Assistant | Admitting: Physician Assistant

## 2019-03-22 ENCOUNTER — Encounter (HOSPITAL_COMMUNITY): Payer: Self-pay | Admitting: Physician Assistant

## 2019-03-22 ENCOUNTER — Other Ambulatory Visit: Payer: Self-pay

## 2019-03-22 VITALS — BP 129/89 | HR 72 | Temp 97.9°F | Wt 269.6 lb

## 2019-03-22 DIAGNOSIS — Z23 Encounter for immunization: Secondary | ICD-10-CM | POA: Insufficient documentation

## 2019-03-22 DIAGNOSIS — I2510 Atherosclerotic heart disease of native coronary artery without angina pectoris: Secondary | ICD-10-CM | POA: Insufficient documentation

## 2019-03-22 DIAGNOSIS — K219 Gastro-esophageal reflux disease without esophagitis: Secondary | ICD-10-CM | POA: Insufficient documentation

## 2019-03-22 DIAGNOSIS — I10 Essential (primary) hypertension: Secondary | ICD-10-CM

## 2019-03-22 DIAGNOSIS — E785 Hyperlipidemia, unspecified: Secondary | ICD-10-CM

## 2019-03-22 NOTE — Nursing Note (Signed)
1. Are you 52 years of age or older? no  2. Have you ever had a severe reaction to a flu shot? no  3. Are you allergic to eggs? no  4. Are you allergic to latex? no  5. Are you allergic to Thimerosol? no  6. Are you experiencing acute illness symptoms or have you been running a fever? no  7. Do you have a medical condition or taking medications that suppress your immune system? no  8. Have you ever had Guillain-Barre syndrome or other neurologic disorder? no  9. Are you pregnant or breastfeeding? no    Immunization administered     Name Date Dose VIS Date Route    INFLUENZA VACCINE IM 03/22/2019 0.5 mL 02/01/2018 Intramuscular    Site: Right deltoid    Given By: Kendall Flack, MA    Manufacturer: [employer]    Lot: [id number]    NDC: [id number]          Kendall Flack, MA 03/22/2019, 14:07

## 2019-03-22 NOTE — Progress Notes (Signed)
See dictated note.

## 2019-03-24 NOTE — Progress Notes (Signed)
PATIENT NAME: [name] [name], [name] [name]  HOSPITAL NUMBER:  [id number]  DATE OF SERVICE: 03/22/2019  DATE OF BIRTH:      CLINIC NOTE    PRIMARY CARE PROVIDER:  Randol Kern, PA-C    PRIMARY CARDIOLOGIST:  Jodi Geralds, MD.    SUBJECTIVE:  [name] [name] is a 53 year old white male gentleman with a known history of coronary artery disease.  The patient had undergone multiple interventions to his LAD.  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  At that point in time, it was recommended to undergo bypass surgery.  The patient was seen and evaluated by the surgeons and underwent a single-vessel LIMA to the LAD off pump bypass.  The patient comes in today for followup.     The patient states he is doing well.  He feels well.  He is able to do everything he wants to do without significant symptoms.  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  He has not been hospitalized since his last visit.  He denies any chest pain or chest pressure.  He denies any cough, shortness of breath, exertional dyspnea.  He denies any lightheadedness, dizziness or syncope.  He is able to do everything he wants to without symptoms.    OBJECTIVE:  Physical exam shows an obese white male gentleman.  He is afebrile and he is hemodynamically stable.  Blood pressure is 129/89, pulse is 72. Weight is 122 kg.  HEENT:  Head is normocephalic, atraumatic.  His eyes are nonicteric.  Nose midline.  Neck is supple.  His carotids are equal bilaterally without bruit.  His heart is regular rate and rhythm without murmur.  His lungs are clear bilaterally.  Abdomen is large, round, soft.  Bowel sounds are positive.  No guarding or rebound.  Extremities are without edema.  Skin is without rash.    MEDICATIONS:  Current Outpatient Medications   Medication Sig   . acetaminophen/diphenhydramine (TYLENOL PM ORAL) Take 2 Caps by  mouth Every night   . aspirin 81 mg Oral Tablet, Chewable Take 1 Tab (81 mg total) by mouth Once a day   . atorvastatin (LIPITOR) 40 mg Oral Tablet Take 2 Tabs (80 mg total) by mouth Every evening for 90 days   . clopidogreL (PLAVIX) 75 mg Oral Tablet Take 1 tablet by mouth once daily   . Famotidine (PEPCID) 10 mg Oral Tablet Take 10 mg by mouth Twice daily   . fluticasone furoate-vilanteroL (BREO ELLIPTA) 200-25 mcg/dose Inhalation Disk with Device Take 1 INHALATION by inhalation Once a day   . ipratropium-albuterol 0.5 mg-3 mg(2.5 mg base)/3 mL Solution for Nebulization 3 mL by Nebulization route Four times a day   . metoprolol succinate (TOPROL-XL) 100 mg Oral Tablet Sustained Release 24 hr Take 100 mg by mouth Once a day   . montelukast (SINGULAIR) 10 mg Oral Tablet Take 10 mg by mouth Every evening   . nitroglycerin (NITROSTAT) 0.4 mg Sublingual Tablet, Sublingual 0.4 mg by Sublingual route Every 5 minutes as needed for Chest pain for 3 doses over 15 minutes   . pantoprazole (PROTONIX) 20 mg Oral Tablet, Delayed Release (E.C.) Take 20 mg by mouth Every morning before breakfast   . VENTOLIN HFA 90 mcg/actuation Inhalation HFA Aerosol Inhaler    . VITAMIN D 1,250 mcg (50,000 unit) Oral Capsule Take 50,000 Units by mouth Every 7 days     LABORATORY  DATA:  The patient's most recent laboratory values showed an LDL of 68.    ASSESSMENT AND PLAN:  1. Coronary artery disease status post previous single-vessel coronary bypass surgery with a LIMA to his LAD in March 2018.  The patient currently remains angina free asymptomatic on aspirin, Plavix, Lopressor, and statin therapy.  We will go ahead and continue his current medical regimen see him back in followup in a 59-month time period.  The patient had a preserved ejection fraction  of approximately 72%.  2. Hypertension, currently well controlled.  3. Hyperlipidemia.  He remains on Lipitor.  The patient's last LDL was at goal.  4. Gastroesophageal reflux disease, stable  on Zantac.  His symptoms have greatly improved with his weight reduction.     We will go ahead and see the patient back in followup in a 105-month time period with the understanding that should he have any change in condition prior to that time, that he should call or return back to the nearest Emergency Department.      The patient was seen independently with supervising physician available for consultation.    Laurian Brim, PA-C  Chief [place] Department of Medicine, Section of Cardiology              DD:  03/22/2019 14:39:31  DT:  03/24/2019 15:24:24 ES  D#:  [id number]

## 2019-04-18 ENCOUNTER — Encounter (HOSPITAL_COMMUNITY): Payer: Self-pay

## 2019-04-18 ENCOUNTER — Inpatient Hospital Stay (HOSPITAL_COMMUNITY): Payer: MEDICAID | Admitting: Hospitalist

## 2019-04-18 ENCOUNTER — Inpatient Hospital Stay
Admission: EM | Admit: 2019-04-18 | Discharge: 2019-04-22 | DRG: 313 | Disposition: A | Discharge status: Home / Self Care | Payer: MEDICAID | Attending: Internal Medicine | Admitting: Internal Medicine

## 2019-04-18 ENCOUNTER — Other Ambulatory Visit: Payer: Self-pay

## 2019-04-18 DIAGNOSIS — I249 Acute ischemic heart disease, unspecified: Secondary | ICD-10-CM

## 2019-04-18 DIAGNOSIS — Z9861 Coronary angioplasty status: Secondary | ICD-10-CM

## 2019-04-18 DIAGNOSIS — I445 Left posterior fascicular block: Secondary | ICD-10-CM

## 2019-04-18 DIAGNOSIS — Z951 Presence of aortocoronary bypass graft: Secondary | ICD-10-CM

## 2019-04-18 DIAGNOSIS — R110 Nausea: Secondary | ICD-10-CM | POA: Diagnosis present

## 2019-04-18 DIAGNOSIS — R0600 Dyspnea, unspecified: Secondary | ICD-10-CM

## 2019-04-18 DIAGNOSIS — K219 Gastro-esophageal reflux disease without esophagitis: Secondary | ICD-10-CM | POA: Diagnosis present

## 2019-04-18 DIAGNOSIS — J45909 Unspecified asthma, uncomplicated: Secondary | ICD-10-CM | POA: Diagnosis present

## 2019-04-18 DIAGNOSIS — Z9989 Dependence on other enabling machines and devices: Secondary | ICD-10-CM

## 2019-04-18 DIAGNOSIS — Z87891 Personal history of nicotine dependence: Secondary | ICD-10-CM

## 2019-04-18 DIAGNOSIS — Z6834 Body mass index (BMI) 34.0-34.9, adult: Secondary | ICD-10-CM

## 2019-04-18 DIAGNOSIS — Z7982 Long term (current) use of aspirin: Secondary | ICD-10-CM

## 2019-04-18 DIAGNOSIS — Z7951 Long term (current) use of inhaled steroids: Secondary | ICD-10-CM

## 2019-04-18 DIAGNOSIS — R55 Syncope and collapse: Secondary | ICD-10-CM | POA: Diagnosis present

## 2019-04-18 DIAGNOSIS — I2510 Atherosclerotic heart disease of native coronary artery without angina pectoris: Secondary | ICD-10-CM | POA: Diagnosis present

## 2019-04-18 DIAGNOSIS — E7800 Pure hypercholesterolemia, unspecified: Secondary | ICD-10-CM | POA: Diagnosis present

## 2019-04-18 DIAGNOSIS — Z7902 Long term (current) use of antithrombotics/antiplatelets: Secondary | ICD-10-CM

## 2019-04-18 DIAGNOSIS — E669 Obesity, unspecified: Secondary | ICD-10-CM | POA: Diagnosis present

## 2019-04-18 DIAGNOSIS — I10 Essential (primary) hypertension: Secondary | ICD-10-CM | POA: Diagnosis present

## 2019-04-18 DIAGNOSIS — R0789 Other chest pain: Principal | ICD-10-CM

## 2019-04-18 DIAGNOSIS — I2582 Chronic total occlusion of coronary artery: Secondary | ICD-10-CM | POA: Diagnosis present

## 2019-04-18 DIAGNOSIS — I252 Old myocardial infarction: Secondary | ICD-10-CM

## 2019-04-18 DIAGNOSIS — R61 Generalized hyperhidrosis: Secondary | ICD-10-CM | POA: Diagnosis present

## 2019-04-18 DIAGNOSIS — G4733 Obstructive sleep apnea (adult) (pediatric): Secondary | ICD-10-CM | POA: Diagnosis present

## 2019-04-18 DIAGNOSIS — E785 Hyperlipidemia, unspecified: Secondary | ICD-10-CM

## 2019-04-18 DIAGNOSIS — R079 Chest pain, unspecified: Secondary | ICD-10-CM | POA: Diagnosis present

## 2019-04-18 HISTORY — DX: Acute myocardial infarction, unspecified (CMS HCC): I21.9

## 2019-04-18 LAB — CBC WITH DIFF
BASOPHIL #: 0.1 10*3/uL (ref 0.00–0.20)
BASOPHIL %: 1 %
EOSINOPHIL #: 0.1 10*3/uL (ref 0.00–0.50)
EOSINOPHIL %: 1 %
HCT: 46.7 % (ref 38.9–50.5)
HGB: 16.3 g/dL (ref 13.4–17.3)
LYMPHOCYTE #: 2.7 10*3/uL (ref 0.80–3.20)
LYMPHOCYTE %: 31 %
MCH: 29.9 pg (ref 27.9–33.1)
MCHC: 34.9 g/dL (ref 32.8–36.0)
MCV: 85.8 fL (ref 82.4–95.0)
MONOCYTE #: 1 10*3/uL — ABNORMAL HIGH (ref 0.20–0.80)
MONOCYTE %: 11 %
MPV: 8.8 fL (ref 6.0–10.2)
NEUTROPHIL #: 4.8 10*3/uL (ref 1.60–5.50)
NEUTROPHIL %: 56 %
PLATELETS: 238 10*3/uL (ref 140–440)
RBC: 5.44 10*6/uL (ref 4.40–5.68)
RDW: 12.9 % (ref 10.9–15.1)
WBC: 8.6 10*3/uL (ref 3.3–9.3)

## 2019-04-18 LAB — PTT (PARTIAL THROMBOPLASTIN TIME)
APTT: 29.2 s (ref 25.0–37.0)
APTT: 79.5 s — ABNORMAL HIGH (ref 25.0–37.0)

## 2019-04-18 LAB — VENOUS BLOOD GAS
%FIO2 (VENOUS): 21 %
BASE EXCESS: 0.3 mmol/L (ref ?–2.0)
BICARBONATE (VENOUS): 25 mmol/L (ref 23.0–33.0)
O2 SATURATION (VENOUS): 90 % — ABNORMAL LOW (ref 95.0–98.0)
PCO2 (VENOUS): 38 mmHg (ref 38.00–50.00)
PH (VENOUS): 7.42 (ref 7.32–7.45)
PO2 (VENOUS): 57 mmHg — ABNORMAL HIGH (ref 30.0–50.0)

## 2019-04-18 LAB — BASIC METABOLIC PANEL
ANION GAP: 8 mmol/L
BUN/CREA RATIO: 16
BUN: 18 mg/dL (ref 10–25)
CALCIUM: 9.6 mg/dL (ref 8.8–10.3)
CHLORIDE: 106 mmol/L (ref 98–111)
CO2 TOTAL: 24 mmol/L (ref 21–35)
CREATININE: 1.15 mg/dL (ref <=1.30)
ESTIMATED GFR: >60 mL/min/{1.73_m2}
GLUCOSE: 94 mg/dL (ref 70–110)
POTASSIUM: 4.5 mmol/L (ref 3.5–5.0)
SODIUM: 138 mmol/L (ref 135–145)

## 2019-04-18 LAB — PT/INR: INR: 1.03 (ref 0.80–1.10)

## 2019-04-18 LAB — LIPID PANEL
CHOLESTEROL: 134 mg/dL (ref 0–199)
HDL CHOL: 39 mg/dL — ABNORMAL LOW (ref >40)
LDL DIRECT: 80 mg/dL (ref 0–99)
TRIGLYCERIDES: 127 mg/dL (ref 0–199)
VLDL CALC: 25 mg/dL (ref 0–50)

## 2019-04-18 LAB — TROPONIN-I
TROPONIN I: 0 ng/mL (ref <=0.04)
TROPONIN I: 0 ng/mL (ref <=0.04)
TROPONIN I: 0 ng/mL (ref <=0.04)

## 2019-04-18 LAB — THYROID STIMULATING HORMONE WITH FREE T4 REFLEX: TSH: 1.465 u[IU]/mL (ref 0.450–5.330)

## 2019-04-18 LAB — B-TYPE NATRIURETIC PEPTIDE (BNP),PLASMA: BNP: 38 pg/mL (ref 0–100)

## 2019-04-18 LAB — COVID-19 ~~LOC~~ MOLECULAR LAB TESTING: SARS-CoV-2: NOT DETECTED

## 2019-04-18 MED ORDER — ENOXAPARIN 40 MG/0.4 ML SUBCUTANEOUS SYRINGE
40.0000 mg | INJECTION | SUBCUTANEOUS | Status: DC
Start: 2019-04-18 — End: 2019-04-18
  Administered 2019-04-18 (×2)

## 2019-04-18 MED ORDER — ACETAMINOPHEN 500 MG TABLET
1000.00 mg | ORAL_TABLET | ORAL | Status: AC
Start: 2019-04-18 — End: 2019-04-18
  Administered 2019-04-18: 1000 mg via ORAL

## 2019-04-18 MED ORDER — PANTOPRAZOLE 20 MG TABLET,DELAYED RELEASE
20.00 mg | DELAYED_RELEASE_TABLET | Freq: Every evening | ORAL | Status: DC
Start: 2019-04-18 — End: 2019-04-22
  Administered 2019-04-18 – 2019-04-21 (×4): 20 mg via ORAL
  Filled 2019-04-18 (×4): qty 1

## 2019-04-18 MED ORDER — HEPARIN (PORCINE) 1,000 UNITS/ML BOLUS
60.0000 [IU]/kg | Freq: Once | INTRAMUSCULAR | Status: AC
Start: 2019-04-18 — End: 2019-04-18
  Administered 2019-04-18: 5800 [IU] via INTRAVENOUS
  Filled 2019-04-18: qty 10

## 2019-04-18 MED ORDER — ACETAMINOPHEN 325 MG TABLET
650.0000 mg | ORAL_TABLET | Freq: Four times a day (QID) | ORAL | Status: DC | PRN
Start: 2019-04-18 — End: 2019-04-22
  Administered 2019-04-19: 650 mg via ORAL
  Filled 2019-04-18: qty 2

## 2019-04-18 MED ORDER — ASPIRIN 81 MG CHEWABLE TABLET
81.00 mg | CHEWABLE_TABLET | Freq: Every evening | ORAL | Status: DC
Start: 2019-04-18 — End: 2019-04-22
  Administered 2019-04-18 – 2019-04-21 (×4): 81 mg via ORAL
  Filled 2019-04-18 (×4): qty 1

## 2019-04-18 MED ORDER — ASPIRIN 81 MG CHEWABLE TABLET
81.0000 mg | CHEWABLE_TABLET | Freq: Every day | ORAL | Status: DC
Start: 2019-04-19 — End: 2019-04-18

## 2019-04-18 MED ORDER — CLOPIDOGREL 75 MG TABLET
75.00 mg | ORAL_TABLET | Freq: Every evening | ORAL | Status: DC
Start: 2019-04-18 — End: 2019-04-22
  Administered 2019-04-18 – 2019-04-21 (×4): 75 mg via ORAL
  Filled 2019-04-18 (×6): qty 1

## 2019-04-18 MED ORDER — PANTOPRAZOLE 20 MG TABLET,DELAYED RELEASE
20.00 mg | DELAYED_RELEASE_TABLET | Freq: Every morning | ORAL | Status: DC
Start: 2019-04-19 — End: 2019-04-18

## 2019-04-18 MED ORDER — BUDESONIDE-FORMOTEROL HFA 160 MCG-4.5 MCG/ACTUATION - EAST
2.00 | INHALATION_SPRAY | Freq: Two times a day (BID) | RESPIRATORY_TRACT | Status: DC
Start: 2019-04-18 — End: 2019-04-20
  Administered 2019-04-18 – 2019-04-20 (×5): 0 via RESPIRATORY_TRACT
  Filled 2019-04-18 (×2): qty 6

## 2019-04-18 MED ORDER — FAMOTIDINE 20 MG TABLET
10.00 mg | ORAL_TABLET | Freq: Two times a day (BID) | ORAL | Status: DC
Start: 2019-04-18 — End: 2019-04-22
  Administered 2019-04-18: 22:00:00 10 mg via ORAL
  Administered 2019-04-19: 0 mg via ORAL
  Administered 2019-04-19 – 2019-04-22 (×6): 10 mg via ORAL
  Filled 2019-04-18 (×11): qty 0.5

## 2019-04-18 MED ORDER — METOPROLOL SUCCINATE ER 50 MG TABLET,EXTENDED RELEASE 24 HR
100.0000 mg | ORAL_TABLET | Freq: Every day | ORAL | Status: DC
Start: 2019-04-19 — End: 2019-04-20
  Administered 2019-04-19: 0 mg via ORAL
  Administered 2019-04-20: 100 mg via ORAL
  Filled 2019-04-18 (×3): qty 2

## 2019-04-18 MED ORDER — MONTELUKAST 10 MG TABLET
10.00 mg | ORAL_TABLET | Freq: Every evening | ORAL | Status: DC
Start: 2019-04-18 — End: 2019-04-22
  Administered 2019-04-18 – 2019-04-21 (×4): 10 mg via ORAL
  Filled 2019-04-18 (×6): qty 1

## 2019-04-18 MED ORDER — ONDANSETRON HCL (PF) 4 MG/2 ML INJECTION SOLUTION
4.0000 mg | Freq: Four times a day (QID) | INTRAMUSCULAR | Status: DC | PRN
Start: 2019-04-18 — End: 2019-04-22

## 2019-04-18 MED ORDER — IPRATROPIUM 0.5 MG-ALBUTEROL 3 MG (2.5 MG BASE)/3 ML NEBULIZATION SOLN
3.00 mL | INHALATION_SOLUTION | Freq: Four times a day (QID) | RESPIRATORY_TRACT | Status: DC
Start: 2019-04-18 — End: 2019-04-22
  Administered 2019-04-18: 0 mL via RESPIRATORY_TRACT
  Administered 2019-04-18: 3 mL via RESPIRATORY_TRACT
  Administered 2019-04-19 (×2): 0 mL via RESPIRATORY_TRACT
  Administered 2019-04-19: 3 mL via RESPIRATORY_TRACT
  Administered 2019-04-19: 0 mL via RESPIRATORY_TRACT
  Administered 2019-04-20: 3 mL via RESPIRATORY_TRACT
  Administered 2019-04-20 (×2): 0 mL via RESPIRATORY_TRACT
  Administered 2019-04-20: 3 mL via RESPIRATORY_TRACT
  Administered 2019-04-21: 0 mL via RESPIRATORY_TRACT
  Administered 2019-04-21: 07:00:00 3 mL via RESPIRATORY_TRACT
  Administered 2019-04-21 – 2019-04-22 (×3): 0 mL via RESPIRATORY_TRACT
  Administered 2019-04-22 (×2): 3 mL via RESPIRATORY_TRACT
  Filled 2019-04-18 (×22): qty 3

## 2019-04-18 MED ORDER — NITROGLYCERIN 0.4 MG SUBLINGUAL TABLET
0.40 mg | SUBLINGUAL_TABLET | SUBLINGUAL | Status: DC | PRN
Start: 2019-04-18 — End: 2019-04-22
  Administered 2019-04-19: 0.4 mg via SUBLINGUAL

## 2019-04-18 MED ORDER — SODIUM CHLORIDE 0.9 % (FLUSH) INJECTION SYRINGE
3.0000 mL | INJECTION | Freq: Three times a day (TID) | INTRAMUSCULAR | Status: DC
Start: 2019-04-18 — End: 2019-04-22
  Administered 2019-04-18 – 2019-04-22 (×13): 0 mL

## 2019-04-18 MED ORDER — SODIUM CHLORIDE 0.9 % (FLUSH) INJECTION SYRINGE
3.0000 mL | INJECTION | INTRAMUSCULAR | Status: DC | PRN
Start: 2019-04-18 — End: 2019-04-22

## 2019-04-18 MED ORDER — CLOPIDOGREL 75 MG TABLET
75.0000 mg | ORAL_TABLET | Freq: Every day | ORAL | Status: DC
Start: 2019-04-19 — End: 2019-04-18
  Filled 2019-04-18: qty 1

## 2019-04-18 MED ORDER — ATORVASTATIN 80 MG TABLET
80.00 mg | ORAL_TABLET | Freq: Every evening | ORAL | Status: DC
Start: 2019-04-18 — End: 2019-04-22
  Administered 2019-04-18 – 2019-04-21 (×4): 80 mg via ORAL
  Filled 2019-04-18 (×6): qty 1

## 2019-04-18 MED ORDER — HEPARIN (PORCINE) 25,000 UNIT/250 ML (100 UNIT/ML) IN DEXTROSE 5 % IV
12.0000 [IU]/kg/h | INTRAVENOUS | Status: DC
Start: 2019-04-18 — End: 2019-04-19
  Administered 2019-04-18: 12 [IU]/kg/h via INTRAVENOUS
  Administered 2019-04-18: 11 [IU]/kg/h via INTRAVENOUS
  Administered 2019-04-18: 12 [IU]/kg/h via INTRAVENOUS
  Administered 2019-04-19: 0 [IU]/kg/h via INTRAVENOUS
  Filled 2019-04-18: qty 250

## 2019-04-18 NOTE — Respiratory Therapy (Signed)
Went in to take CPAP machine to patient and he refuses to use due to his acid reflux. Discontinued order due to refusal. Genene Churn, RT  04/18/2019, 16:10

## 2019-04-18 NOTE — Nurses Notes (Signed)
Pt resting in bed at this time. Pt is alert and oriented. Pt was admitted from ED this afternoon. No chest pain since arrival to the floor. Pt on room air, NSR on the monitor. Pt ambulates independently. No s&s of distress. Will continue to monitor.    Clemencia Course, RN

## 2019-04-18 NOTE — H&P (Signed)
[facility]  General Medicine  Admission H&P    Date of Service:  04/18/2019  [name] [name], [name] [name], 53 y.o. male  Date of Admission:  04/18/2019  Date of Birth:    PCP: Randol Kern, PA-C    LAY CAREGIVER   Appointed Lay Caregiver?: I Decline     Information Obtained from: patient  Chief Complaint:  Diaphoresis, nausea, chest pressure     HPI: [name] [name] is a 53 y.o., White male with past medical history significant for CAD status post CABG who presented to the emergency department today with sudden onset diffuse diaphoresis, nausea, lightheadedness/dizziness, chest pressure.  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  Patient said that he was diffusely sweating and it soaked through his shirt.  Patient then got very nauseous, but without emesis.  He also endorsed severe lightheadedness and dizziness, and then had some chest pressure like sensation in the middle of his chest.  Patient said this was similar to his previous heart attacks.  Patient said he took a nitroglycerin and this improved the chest pressure so he came to the emergency department.  Patient says normally he walks the stairs at work and has no difficulty with exertion.  He denies any shortness of breath with exertion, orthopnea, leg swelling.  Most recent cardiovascular studies include left heart catheterization on 04/27/2018 which showed totally occluded LAD, but widely patent LIMA to LAD.  Echocardiogram at that time showed an ejection fraction of approximately 72%.    Workup in the emergency department essentially negative.  His initial troponin was 0.00.  Labs within normal limits.  EKG shows inverted T-waves in V1-V2 with some mild ST elevation in lead 2, but is unchanged from his previous EKGs.  Hospital Medicine was asked to admit the patient for ACS rule out.    PAST MEDICAL:    Past Medical History:      Diagnosis Date   . Asthma    . Coronary artery disease    . CPAP (continuous positive airway pressure) dependence    . Esophageal reflux    . Hypercholesterolemia    . Hypertension    . Knee injury    . MI (myocardial infarction) (CMS HCC)    . Sleep apnea         Past Surgical History:   Procedure Laterality Date   . CORONARY ARTERY ANGIOPLASTY     . HX DENTAL EXTRACTION     . HX STENTING (ANY)  06/15/2011   . HX TONSIL AND ADENOIDECTOMY              Medications Prior to Admission     Prescriptions    acetaminophen/diphenhydramine (TYLENOL PM ORAL)    Take 2 Caps by mouth Every night    aspirin 81 mg Oral Tablet, Chewable    Take 1 Tab (81 mg total) by mouth Once a day    atorvastatin (LIPITOR) 40 mg Oral Tablet    Take 2 Tabs (80 mg total) by mouth Every evening for 90 days    clopidogreL (PLAVIX) 75 mg Oral Tablet    Take 1 tablet by mouth once daily    Famotidine (PEPCID) 10 mg Oral Tablet    Take 10 mg by mouth Twice daily    fluticasone furoate-vilanteroL (BREO ELLIPTA) 200-25 mcg/dose Inhalation Disk with Device    Take  1 INHALATION by inhalation Once a day    ipratropium-albuterol 0.5 mg-3 mg(2.5 mg base)/3 mL Solution for Nebulization    3 mL by Nebulization route Four times a day    metoprolol succinate (TOPROL-XL) 100 mg Oral Tablet Sustained Release 24 hr    Take 100 mg by mouth Once a day    montelukast (SINGULAIR) 10 mg Oral Tablet    Take 10 mg by mouth Every evening    nitroglycerin (NITROSTAT) 0.4 mg Sublingual Tablet, Sublingual    0.4 mg by Sublingual route Every 5 minutes as needed for Chest pain for 3 doses over 15 minutes    pantoprazole (PROTONIX) 20 mg Oral Tablet, Delayed Release (E.C.)    Take 20 mg by mouth Every morning before breakfast    VENTOLIN HFA 90 mcg/actuation Inhalation HFA Aerosol Inhaler    VITAMIN D 1,250 mcg (50,000 unit) Oral Capsule    Take 50,000 Units by mouth Every 7 days        Allergies   Allergen Reactions   . Phenergan [Promethazine] Seizure         Family  History  Family Medical History:     Problem Relation (Age of Onset)    Coronary Artery Disease Mother, Father             Social History  Social History     Tobacco Use   . Smoking status: Former Smoker     Years: 10.00     Last attempt to quit: 04/20/1992     Years since quitting: 27.0   . Smokeless tobacco: Never Used   Substance Use Topics   . Alcohol use: No     Alcohol/week: 0.0 standard drinks       ROS:  Constitutional: No fever, chills or weakness, +fatigue  Skin: No rash, +diaphoresis  HENT: No headaches or congestion, +lightheadedness/dizziness  Eyes: No vision changes   Cardio: +chest pain, no palpitations or leg swelling   Respiratory: No cough, wheezing, shortness of breath  GI: no abdominal pain, +nausea, no vomiting, diarrhea   GU:  No dysuria or hematuria   MSK: No joint or back pain  Neuro: No seizures or LOC  Psychiatric: No depression, SI or substance abuse  All other systems reviewed and are negative.    Examination:  Temperature: 36.6 C (97.8 F) Heart Rate: 66 BP (Non-Invasive): 133/89   Respiratory Rate: 18 SpO2: 100 % Pain Score (Numeric, Faces): 0     Constitutional:  No distress and alert and oriented, obese   Eyes:  Conjunctiva clear, Pupils equal and round.   Neck:  Supple, symmetrical, trachea midline, no JVD   Respiratory:  Clear to auscultation bilaterally, no wheezes.  Cardiovascular:  Regular rate and rhythm, no murmurs.  Gastrointestinal:  Soft, non-tender, Bowel sounds normal, non-distended.  Musculoskeletal:  Head atraumatic and normocephalic.  Integumentary:  Skin warm and dry, No rashes or lesions.  Neurologic:  CN II - XII grossly intact, No tremor.     Labs:    CBC  (Last 24 hours)    Date/Time WBC HGB HCT MCV PLATELETS    04/18/19 1218 8.6    16.3    46.7    85.8    238           BMP  (Last 24 hours)    Date/Time Na K Cl CO2 BUN CREAT Calcium Glucose    04/18/19 1218 138    4.5    106    24  18    1.15    9.6    94                 Imaging Studies:   No orders to display          DNR Status:  Full Code    Assessment/Plan:   Active Hospital Problems    Diagnosis   . Chest pain     53 year old male with past medical history significant for CAD status post bypass who presented to the emergency department with symptoms similar to his previous heart attacks of chest pressure, diaphoresis, lightheadedness and nausea.    Chest pain  CAD status post CABG  Hyperlipidemia  Hypertension  OSA on CPAP  Asthma  Obesity    Plan:  -admit patient to the floor for close monitoring  -patient placed on heparin drip by the emergency department.  Trend troponins.  If the 2nd troponin is negative, can discontinue heparin drip  -nitroglycerin for chest pain.  If patient has additional symptoms, will repeat troponin and EKG at that time  -check echocardiogram cm the patient has any wall motion abnormalities  -check lipid panel, TSH, BNP  -CPAP for OSA      DVT/PE Prophylaxis: Heparin  Disposition Planning: Home discharge     Fonda Kinderhristina Kitsos, MD

## 2019-04-18 NOTE — ED Provider Notes (Signed)
Department of Emergency [place]        HPI: Patient is a 53 y.o.  male presenting to the ED with chief complaint of episode of L central CP, diaphoresis, nausea, and dizziness earlier today while at work. Pt described the dizziness as "room spinning." States that he began to fall but was caught by his coworkers. He was given NTG and Aspirin by EMS with relief of chest pressure, dizziness, and nausea. Notes that he only feels fatigued and somewhat SOB currently. He reports similar symptoms including vertigo with prior MI. PMHx includes MI, asthma, GERD, hernias, CABG, and cardiac stents. He follows with Dr. Wilhemina Cash and Dr. Billie Ruddy. His last heart catheterization was last year. Pt is currently taking Plavix. Denies leg swelling, radiation of pain into arms or jaw, and any other complaints or concerns at this time.  Of note, he reports that he is worried about COVID because of a coworker testing positive, however he had not been in close contact with them. He was tested 2 days ago and is waiting for his result.    Review of Systems:    Constitutional: No fever, chills + fatigue + dizziness  Skin: No rashes or lesions + diaphoresis  HENT: No sore throat, ear pain, or difficulty swallowing  Eyes: No vision changes, redness, discharge  Cardio: No palpitations + CP  Respiratory: No cough or wheezing + SOB  GI:  No vomiting. No diarrhea or constipation. No abdominal pain + nausea  GU:  No dysuria, hematuria, polyuria  MSK: No joint pain.  No neck or back pain  Neuro: No numbness, tingling, or weakness.  No headache    All other symptoms reviewed and are negative, unless commented on in the HPI.     Past Medical History:  Past Medical History:   Diagnosis Date   . Asthma    . Coronary artery disease    . CPAP (continuous positive airway pressure) dependence    . Esophageal reflux    . Hypercholesterolemia    . Hypertension    . Knee injury    . MI (myocardial infarction) (CMS [place])    . Sleep apnea            Past Surgical History:    Past Surgical History:   Procedure Laterality Date   . CORONARY ARTERY ANGIOPLASTY     . HX DENTAL EXTRACTION     . HX STENTING (ANY)  06/15/2011   . HX TONSIL AND ADENOIDECTOMY           Allergies:  Allergies   Allergen Reactions   . Phenergan [Promethazine] Seizure     Medications:    No current outpatient medications on file.     Social History:    Social History     Tobacco Use   . Smoking status: Former Smoker     Years: 10.00     Last attempt to quit: 04/20/1992     Years since quitting: 27.0   . Smokeless tobacco: Never Used   Substance Use Topics   . Alcohol use: No     Alcohol/week: 0.0 standard drinks   . Drug use: No     Family History: No significant family history.  Family Medical History:     Problem Relation (Age of Onset)    Coronary Artery Disease Mother, Father              PMH, PSH, medications, allergies, SH, and FH per medical record  and reviewed with patient. Important aspects of these fields pertaining to today's visit taken into consideration during history/physical and MDM.    ED Triage Vitals [04/18/19 1206]   BP (Non-Invasive) 109/79   Heart Rate 80   Respiratory Rate 20   Temperature 36.5 C (97.7 F)   SpO2 94 %   Weight 120 kg (265 lb)   Height 1.88 m ('6\' 2"'$ )     Filed Vitals:    04/18/19 1209 04/18/19 1215 04/18/19 1345 04/18/19 1500   BP:  106/82 118/80 129/90   Pulse: 83 70 55 51   Resp:  (!) '27 14 16   '$ Temp:       SpO2: 94% 90% 98% 97%       Physical Exam:     Nursing note and vitals reviewed.  Vital signs reviewed as above. Appears somewhat unwell.   Constitutional: Pt is well-developed and well-nourished. Obese.   Head: Normocephalic and atraumatic.   Eyes: Conjunctivae are normal. Pupils are equal, round, and reactive to light. EOM are intact. No nystagmus.  Neck: Soft, supple, full range of motion.  Cardiovascular: RRR.  No Murmurs/rubs/gallops. Distal pulses present and equal bilaterally.  Pulmonary/Chest: Normal BS BL with no distress. No  audible wheezes or crackles are noted.  Abdominal: Soft, nontender, nondistended.  No rebound, guarding, or masses.  Musculoskeletal: Normal range of motion. No deformities.  Exhibits no edema and no tenderness.   Neurological: Alert and Oriented. Speech normal. Face symmetric.  No focal deficits noted. No ataxia.   Skin: Warm. Evidence of recent diaphoresis.  Psychiatric: Patient has a normal mood and affect.     Workup:   Orders Placed This Encounter   . CBC/DIFF   . BASIC METABOLIC PANEL   . TROPONIN-I   . PT/INR   . CBC WITH DIFF   . VENOUS BLOOD GAS   . PTT (PARTIAL THROMBOPLASTIN TIME)   . CBC   . CANCELED: CBC   . COVID-19 SCREENING - Admitted or Likely to be Admitted   . CBC/DIFF   . BASIC METABOLIC PANEL, NON-FASTING   . MAGNESIUM   . PHOSPHORUS   . TROPONIN-I   . B-TYPE NATRIURETIC PEPTIDE   . LIPID PANEL   . THYROID STIMULATING HORMONE WITH FREE T4 REFLEX   . ECG 12 LEAD - ED USE   . ECG 12 LEAD - ED USE   . TRANSTHORACIC ECHOCARDIOGRAM - ADULT   . INSERT & MAINTAIN PERIPHERAL IV ACCESS   . PATIENT CLASS/LEVEL OF CARE DESIGNATION - [place]   . heparin 1,000 units/mL initial IV BOLUS   . heparin 25,000 units in D5W 250 mL infusion   . acetaminophen (TYLENOL) tablet   . atorvastatin (LIPITOR) tablet   . ipratropium-albuterol 0.5 mg-3 mg(2.5 mg base)/3 mL Solution for Nebulization   . budesonide-formoterol (SYMBICORT) 160 mcg-4.5 mcg per inhalation oral inhaler   . metoprolol succinate (TOPROL-XL) 24 hr extended release tablet   . famotidine (PEPCID) tablet   . montelukast (SINGULAIR) 10 mg tablet   . aspirin chewable tablet 81 mg   . clopidogrel (PLAVIX) 75 mg tablet   . pantoprazole (PROTONIX) delayed release tablet   . nitroGLYCERIN (NITROSTAT) sublingual tablet   . acetaminophen (TYLENOL) tablet   . ondansetron (ZOFRAN) 2 mg/mL injection   . NS flush syringe   . NS flush syringe         ECG: sinus with minimal inferior ST segment elevation in inferior leads.  Abnormal Lab results:  Labs Reviewed   CBC  WITH DIFF - Abnormal; Notable for the following components:       Result Value    MONOCYTE # 1.00 (*)     All other components within normal limits   VENOUS BLOOD GAS - Abnormal; Notable for the following components:    PO2 (VENOUS) 57.0 (*)     O2 SATURATION (VENOUS) 90.0 (*)     All other components within normal limits   LIPID PANEL - Abnormal; Notable for the following components:    HDL CHOL 39 (*)     All other components within normal limits   TROPONIN-I - Normal   PT/INR - Normal    Narrative:     Coumadin Therapy INR range for Conventional Anticoagulation Therapy is 2.0 to 3.0 and for Intensive Anticoagulation Therapy 2.5 to 3.5   PTT (PARTIAL THROMBOPLASTIN TIME) - Normal   COVID-19 [place] MOLECULAR LAB TESTING - Normal    Narrative:     Results are for the qualitative identification of SARS-CoV-2 (formerly 2019-nCoV) RNA. The SARS-CoV-2 RNA is generally detectable in nasopharyngeal swabs and nasal wash/aspirates during the ACUTE PHASE of infection. Hence, this test is intended to be performed on respiratory specimens collected from individuals who meet Centers for Disease Control and Prevention (CDC) clinical and/or epidemiological criteria for Coronavirus Disease 2019 (COVID-19) testing. CDC COVID-19 criteria for testing on human specimens are available at [facility] webpage information for Healthcare Professionals: Coronavirus Disease 2019 (COVID-19) ([email or web address]).    Disclaimer:  This assay has been authorized by FDA under an Emergency Use Authorization for use in laboratories certified under the Clinical Laboratory Improvement Amendments of 1988 (CLIA), 42 U.S.C. [phone], to perform high complexity tests. The impacts of vaccines, antiviral therapeutics, antibiotics, chemotherapeutic or immunosuppressant drugs have not been evaluated.    Test methodology:   Cepheid Xpert Xpress SARS-CoV-2 Assay real-time polymerase chain reaction (RT-PCR) test on the GeneXpert Dx  system.   CBC/DIFF    Narrative:     The following orders were created for panel order CBC/DIFF.  Procedure                               Abnormality         Status                     ---------                               -----------         ------                     CBC WITH QKMM[[id number]]                Abnormal            Final result                 Please view results for these tests on the individual orders.   BASIC METABOLIC PANEL    Narrative:     Estimated Glomerular Filtration Rate (eGFR) calculated using the CKD-EPI (2009) equation, intended for patients 1 years of age and older. If race and/or gender is not documented or "unknown," there will be no eGFR calculation.   TROPONIN-I   TROPONIN-I   B-TYPE NATRIURETIC PEPTIDE   THYROID STIMULATING HORMONE  WITH FREE T4 REFLEX           Plan: Appropriate labs and imaging ordered. Medical Records reviewed.    MDM:   During the patient's stay in the emergency department, the above listed imaging and/or labs were performed to assist with medical decision making and were reviewed by myself when available for review.     Pt remained stable throughout the emergency department course.     Given highly concerning history, ECG with ST abnormalities but not a STEMI, heparin ordered for concern for ACS.  Admitted for serial troponins and further cardiac evaluation.     Disposition:   Admitted    Impression:   Encounter Diagnosis   Name Primary?   . ACS (acute coronary syndrome) (CMS [facility])        Plan: Admission      Future Appointments   Date Time Provider [place]   08/12/2019  2:30 PM [place], Laverna Peace, MD [facility]   09/26/2019 11:00 AM Elmer Picker, MD [place]       I am scribing for, and in the presence of, Dr. Weston Brass for services provided on 04/18/2019.  Bonnita Levan, [place], SCRIBE  04/18/2019, 13:02    I personally performed the services described in this documentation, as scribed  in my presence, and it is  both accurate  and complete.    Weston Brass, MD

## 2019-04-19 ENCOUNTER — Observation Stay (HOSPITAL_COMMUNITY): Payer: MEDICAID

## 2019-04-19 LAB — ECG 12 LEAD - ED USE
Calculated P Axis: 11 deg
Calculated P Axis: 6 deg
Calculated T Axis: 27 deg
Calculated T Axis: 31 deg
EKG Severity: ABNORMAL
Heart Rate: 66 {beats}/min
Heart Rate: 72 {beats}/min
I 40 Axis: -11 deg
I 40 Axis: 10 deg
PR Interval: 176 ms
PR Interval: 177 ms
QRS Axis: 120 deg
QRS Axis: 121 deg
QRS Duration: 100 ms
QRS Duration: 98 ms
QT Interval: 383 ms
QT Interval: 404 ms
QTC Calculation: 420 ms
QTC Calculation: 424 ms
ST Axis: 40 deg
ST Axis: 44 deg
T 40 Axis: 127 deg
T 40 Axis: 129 deg

## 2019-04-19 LAB — CBC WITH DIFF
BASOPHIL #: 0 10*3/uL (ref 0.00–0.20)
BASOPHIL %: 1 %
EOSINOPHIL #: 0.1 10*3/uL (ref 0.00–0.50)
EOSINOPHIL %: 2 %
HCT: 44.5 % (ref 38.9–50.5)
HGB: 15.9 g/dL (ref 13.4–17.3)
LYMPHOCYTE #: 2.1 10*3/uL (ref 0.80–3.20)
LYMPHOCYTE %: 31 %
MCH: 31.1 pg (ref 27.9–33.1)
MCHC: 35.8 g/dL (ref 32.8–36.0)
MCV: 87 fL (ref 82.4–95.0)
MONOCYTE #: 0.7 10*3/uL (ref 0.20–0.80)
MONOCYTE %: 11 %
MPV: 8.7 fL (ref 6.0–10.2)
NEUTROPHIL #: 3.8 10*3/uL (ref 1.60–5.50)
NEUTROPHIL %: 56 %
PLATELETS: 201 10*3/uL (ref 140–440)
RBC: 5.11 10*6/uL (ref 4.40–5.68)
RDW: 13 % (ref 10.9–15.1)
WBC: 6.8 10*3/uL (ref 3.3–9.3)

## 2019-04-19 LAB — BASIC METABOLIC PANEL
ANION GAP: 7 mmol/L
BUN/CREA RATIO: 15
BUN: 15 mg/dL (ref 10–25)
CALCIUM: 8.9 mg/dL (ref 8.8–10.3)
CHLORIDE: 106 mmol/L (ref 98–111)
CO2 TOTAL: 26 mmol/L (ref 21–35)
CREATININE: 1 mg/dL (ref <=1.30)
ESTIMATED GFR: >60 mL/min/{1.73_m2}
GLUCOSE: 99 mg/dL (ref 70–110)
POTASSIUM: 4 mmol/L (ref 3.5–5.0)
SODIUM: 139 mmol/L (ref 135–145)

## 2019-04-19 LAB — CBC
HCT: 48.3 % (ref 38.9–50.5)
HGB: 16.4 g/dL (ref 13.4–17.3)
MCH: 29.9 pg (ref 27.9–33.1)
MCHC: 34.1 g/dL (ref 32.8–36.0)
MCV: 87.8 fL (ref 82.4–95.0)
MPV: 9 fL (ref 6.0–10.2)
PLATELETS: 220 10*3/uL (ref 140–440)
RBC: 5.5 10*6/uL (ref 4.40–5.68)
RDW: 13.2 % (ref 10.9–15.1)
WBC: 6.8 10*3/uL (ref 3.3–9.3)

## 2019-04-19 LAB — PTT (PARTIAL THROMBOPLASTIN TIME)
APTT: 65.3 s — ABNORMAL HIGH (ref 25.0–37.0)
APTT: 31.4 s (ref 25.0–37.0)

## 2019-04-19 LAB — PHOSPHORUS: PHOSPHORUS: 3.5 mg/dL (ref 2.5–4.5)

## 2019-04-19 LAB — MAGNESIUM: MAGNESIUM: 2.1 mg/dL (ref 1.8–2.3)

## 2019-04-19 MED ORDER — HEPARIN (PORCINE) 1,000 UNITS/ML BOLUS
80.0000 [IU]/kg | Freq: Once | INTRAMUSCULAR | Status: AC
Start: 2019-04-19 — End: 2019-04-19
  Administered 2019-04-19: 7800 [IU] via INTRAVENOUS
  Filled 2019-04-19: qty 10

## 2019-04-19 MED ORDER — HEPARIN (PORCINE) 25,000 UNIT/250 ML (100 UNIT/ML) IN DEXTROSE 5 % IV
18.0000 [IU]/kg/h | INTRAVENOUS | Status: DC
Start: 2019-04-19 — End: 2019-04-22
  Administered 2019-04-19: 18 [IU]/kg/h via INTRAVENOUS
  Administered 2019-04-20 (×2): 14 [IU]/kg/h via INTRAVENOUS
  Administered 2019-04-20: 0 [IU]/kg/h via INTRAVENOUS
  Administered 2019-04-21 – 2019-04-22 (×3): 14 [IU]/kg/h via INTRAVENOUS
  Filled 2019-04-19 (×5): qty 250

## 2019-04-19 NOTE — Consults (Signed)
[facility]  Cardiology Consult  Initial Consultation      [name], [name], 53 y.o. male  Date of service: 04/19/2019  Date of Birth:      Assessment/Plan:   1. Chest pain  2. Near syncope  3. Abnormal echocardiogram was echodensity  In the right ventricle cannot rule out the vegetation versus  Thrombus  4. Coronary artery disease status post bypass surgery number the 5 history of PCI prior to the surgery new 6. The sleep apnea patient on CPAP  6. The hypertension 7. Hyperlipidemia  The patient otherwise at this time with abnormal echocardiogram and chest pain suggest get stress test and suggest to do TEE   Will monitor the patient closely    Subjective     HPI: [name] [name] is a 53 y.o., White male who presents with  Feeling dizzy sweaty almost passed out and had intermittent chest pain the pain was resolved when I saw him what has been stated was pressure and heaviness  The patient denied any nausea or vomiting  The patient denied denied any syncope   cardiac enzymes initially negative  EKG shows sinus rhythm no acute EKG abnormalities      ROS:   Constitutional: Negative for fever. Negative for chills.   Skin: Negative for rash or other lesions.   HEENT: Negative for headaches.   Eyes: Negative for blurred vision and double vision.   Cardiovascular:chest pain and palpitations.   Respiratory: Negative for cough. Is  experiencing shortness of breath. No wheezing.  Gastrointestinal: Negative for nausea, vomiting and abdominal pain. No diarrhea, constipation or hematochezia  Genitourinary: Negative for dysuria, urgency, frequency, or hematuria.   Musculoskeletal: Negative for neck pain and back pain.   Neurological:  dizziness and loss of consciousness.  No numbness, tingling, or other sensation changed.  Psych:  Denies anxiety or depression.  All other systems reviewed and are negative.  Past Medical History:   Diagnosis Date   . Asthma    . Coronary artery disease    . CPAP (continuous positive  airway pressure) dependence    . Esophageal reflux    . Hypercholesterolemia    . Hypertension    . Knee injury    . MI (myocardial infarction) (CMS HCC)    . Sleep apnea          Past Surgical History:   Procedure Laterality Date   . CORONARY ARTERY ANGIOPLASTY     . HX DENTAL EXTRACTION     . HX STENTING (ANY)  06/15/2011   . HX TONSIL AND ADENOIDECTOMY           Prior to Admission Medications:  Medications Prior to Admission     Prescriptions    acetaminophen/diphenhydramine (TYLENOL PM ORAL)    Take 2 Caps by mouth Every night    aspirin 81 mg Oral Tablet, Chewable    Take 1 Tab (81 mg total) by mouth Once a day    atorvastatin (LIPITOR) 40 mg Oral Tablet    Take 2 Tabs (80 mg total) by mouth Every evening for 90 days    clopidogreL (PLAVIX) 75 mg Oral Tablet    Take 1 tablet by mouth once daily    Famotidine (PEPCID) 10 mg Oral Tablet    Take 10 mg by mouth Twice daily    fluticasone furoate-vilanteroL (BREO ELLIPTA) 200-25 mcg/dose Inhalation Disk with Device    Take 1 INHALATION by inhalation Once a day    ipratropium-albuterol 0.5  mg-3 mg(2.5 mg base)/3 mL Solution for Nebulization    3 mL by Nebulization route Four times a day    metoprolol succinate (TOPROL-XL) 100 mg Oral Tablet Sustained Release 24 hr    Take 100 mg by mouth Once a day    montelukast (SINGULAIR) 10 mg Oral Tablet    Take 10 mg by mouth Every evening    nitroglycerin (NITROSTAT) 0.4 mg Sublingual Tablet, Sublingual    0.4 mg by Sublingual route Every 5 minutes as needed for Chest pain for 3 doses over 15 minutes    pantoprazole (PROTONIX) 20 mg Oral Tablet, Delayed Release (E.C.)    Take 20 mg by mouth Every morning before breakfast    VENTOLIN HFA 90 mcg/actuation Inhalation HFA Aerosol Inhaler    VITAMIN D 1,250 mcg (50,000 unit) Oral Capsule    Take 50,000 Units by mouth Every 7 days        Inpatient Medications:  acetaminophen (TYLENOL) tablet, 650 mg, Oral, Q6H PRN  aspirin chewable tablet 81 mg, 81 mg, Oral, QPM  atorvastatin  (LIPITOR) tablet, 80 mg, Oral, QPM  budesonide-formoterol (SYMBICORT) 160 mcg-4.5 mcg per inhalation oral inhaler, 2 Puff, Inhalation, 2x/day  clopidogrel (PLAVIX) 75 mg tablet, 75 mg, Oral, QPM  famotidine (PEPCID) tablet, 10 mg, Oral, 2x/day  heparin 25,000 units in D5W 250 mL infusion, 18 Units/kg/hr (Adjusted), Intravenous, Continuous  ipratropium-albuterol 0.5 mg-3 mg(2.5 mg base)/3 mL Solution for Nebulization, 3 mL, Nebulization, 4x/day  metoprolol succinate (TOPROL-XL) 24 hr extended release tablet, 100 mg, Oral, Daily  montelukast (SINGULAIR) 10 mg tablet, 10 mg, Oral, QPM  nitroGLYCERIN (NITROSTAT) sublingual tablet, 0.4 mg, Sublingual, Q5 Min PRN  NS flush syringe, 3 mL, Intracatheter, Q8HRS  NS flush syringe, 3 mL, Intracatheter, Q1H PRN  ondansetron (ZOFRAN) 2 mg/mL injection, 4 mg, Intravenous, Q6H PRN  pantoprazole (PROTONIX) delayed release tablet, 20 mg, Oral, QPM      Allergies   Allergen Reactions   . Phenergan [Promethazine] Seizure         Family History  Family Medical History:     Problem Relation (Age of Onset)    Coronary Artery Disease Mother, Father              Social History  Social History     Socioeconomic History   . Marital status: Married     Spouse name: Not on file   . Number of children: Not on file   . Years of education: Not on file   . Highest education level: Not on file   Occupational History   . Not on file   Social Needs   . Financial resource strain: Not on file   . Food insecurity     Worry: Not on file     Inability: Not on file   . Transportation needs     Medical: Not on file     Non-medical: Not on file   Tobacco Use   . Smoking status: Former Smoker     Years: 10.00     Last attempt to quit: 04/20/1992     Years since quitting: 27.0   . Smokeless tobacco: Never Used   Substance and Sexual Activity   . Alcohol use: No     Alcohol/week: 0.0 standard drinks   . Drug use: No   . Sexual activity: Not on file   Lifestyle   . Physical activity     Days per week: Not on file      Minutes  per session: Not on file   . Stress: Not on file   Relationships   . Social Wellsite geologist on phone: Not on file     Gets together: Not on file     Attends religious service: Not on file     Active member of club or organization: Not on file     Attends meetings of clubs or organizations: Not on file     Relationship status: Not on file   . Intimate partner violence     Fear of current or ex partner: Not on file     Emotionally abused: Not on file     Physically abused: Not on file     Forced sexual activity: Not on file   Other Topics Concern   . Abuse/Domestic Violence Not Asked   . Caffeine Concern Not Asked   . Calcium intake adequate Not Asked   . Computer Use Not Asked   . Drives Not Asked   . Exercise Concern Not Asked   . Helmet Use Not Asked   . Seat Belt Not Asked   . Special Diet Not Asked   . Sunscreen used Not Asked   . Uses Cane Not Asked   . Uses walker Not Asked   . Uses wheelchair Not Asked   . Right hand dominant Not Asked   . Left hand dominant Not Asked   . Ambidextrous Not Asked   . Shift Work Not Asked   . Unusual Sleep-Wake Schedule Not Asked   Social History Narrative   . Not on file       Objective     Exam:  Filed Vitals:    04/19/19 2019 04/19/19 2100 04/19/19 2136 04/19/19 2153   BP: (!) 131/92  134/88 130/90   Pulse: 90  100    Resp: 20  18    Temp: 36.9 C (98.4 F)  36.4 C (97.5 F)    SpO2: 98% 98%       GENERAL: Patient is alert and oriented x3, not in acute distress.   HEENT: Head is normocephalic atraumatic. Eyes: Anicteric. PERRLA, EOMI.   NECK: No JVD or bruit. Carotid upstroke within normal limits. No lymphadenopathy or thyromegaly.   LUNGS: Clear bilaterally. No rhonchi, rales or wheezing.   HEART: Regular rate and rhythm. No gallop, rub or murmur. No lift, no heaves. PMI was not appreciated. Pulses are intact.   ABDOMEN: Soft, nontender. No masses or organomegaly. Bowel sounds positive. No rebound, no guarding.   EXTREMETIES: No cyanosis,  clubbing or edema.   NEUROLOGIC: Grossly within normal limits. Cranial nerves 2-12 are within normal limits. No focal finding. Speech is normal.   Femoral and distal pulses are intact.   LYMPH NODE: No adenopathy.   PSYCHIATRIC: Normal affect. Alert and oriented x3.   SKIN: No rash.   BACK: No CVA tenderness.   Labs:   Results for orders placed or performed during the hospital encounter of 04/18/19 (from the past 24 hour(s))   BASIC METABOLIC PANEL, NON-FASTING   Result Value Ref Range    SODIUM 139 135 - 145 mmol/L    POTASSIUM 4.0 3.5 - 5.0 mmol/L    CHLORIDE 106 98 - 111 mmol/L    CO2 TOTAL 26 21 - 35 mmol/L    ANION GAP 7 mmol/L    CALCIUM 8.9 8.8 - 10.3 mg/dL    GLUCOSE 99 70 - 540 mg/dL    BUN 15 10 -  25 mg/dL    CREATININE 1.00 <=1.30 mg/dL    BUN/CREA RATIO 15     ESTIMATED GFR >60 Avg: 93 mL/min/1.110m^2   MAGNESIUM   Result Value Ref Range    MAGNESIUM 2.1 1.8 - 2.3 mg/dL   PHOSPHORUS   Result Value Ref Range    PHOSPHORUS 3.5 2.5 - 4.5 mg/dL   PTT (PARTIAL THROMBOPLASTIN TIME)   Result Value Ref Range    APTT 65.3 (H) 25.0 - 37.0 seconds   CBC WITH DIFF   Result Value Ref Range    WBC 6.8 3.3 - 9.3 x10^3/uL    RBC 5.11 4.40 - 5.68 x10^6/uL    HGB 15.9 13.4 - 17.3 g/dL    HCT 44.5 38.9 - 50.5 %    MCV 87.0 82.4 - 95.0 fL    MCH 31.1 27.9 - 33.1 pg    MCHC 35.8 32.8 - 36.0 g/dL    RDW 13.0 10.9 - 15.1 %    PLATELETS 201 140 - 440 x10^3/uL    MPV 8.7 6.0 - 10.2 fL    NEUTROPHIL % 56 %    LYMPHOCYTE % 31 %    MONOCYTE % 11 %    EOSINOPHIL % 2 %    BASOPHIL % 1 %    NEUTROPHIL # 3.80 1.60 - 5.50 x10^3/uL    LYMPHOCYTE # 2.10 0.80 - 3.20 x10^3/uL    MONOCYTE # 0.70 0.20 - 0.80 x10^3/uL    EOSINOPHIL # 0.10 0.00 - 0.50 x10^3/uL    BASOPHIL # 0.00 0.00 - 0.20 x10^3/uL   CBC   Result Value Ref Range    WBC 6.8 3.3 - 9.3 x10^3/uL    RBC 5.50 4.40 - 5.68 x10^6/uL    HGB 16.4 13.4 - 17.3 g/dL    HCT 48.3 38.9 - 50.5 %    MCV 87.8 82.4 - 95.0 fL    MCH 29.9 27.9 - 33.1 pg    MCHC 34.1 32.8 - 36.0 g/dL    RDW  13.2 10.9 - 15.1 %    PLATELETS 220 140 - 440 x10^3/uL    MPV 9.0 6.0 - 10.2 fL   PTT (PARTIAL THROMBOPLASTIN TIME)   Result Value Ref Range    APTT 31.4 25.0 - 37.0 seconds   ECG 12 LEAD - ADULT   Result Value Ref Range    Heart Rate 86 BPM    PR Interval 173 ms    QRS Duration 100 ms    QT Interval 363 ms    QTC Calculation 434 ms    Calculated P Axis 49 deg    QRS Axis 121 deg    Calculated T Axis 8 deg    I 40 Axis -27 deg    T 40 Axis 133 deg    ST Axis 28 deg    EKG Severity - ABNORMAL ECG -           Imaging Studies:    No orders to display           Veatrice Kells, MD  04/19/2019, 23:58      This note may have been partially generated using MModal Fluency Direct system, and there may be some incorrect words, spellings, and punctuation that were not noted in checking the note before saving.

## 2019-04-19 NOTE — Nurses Notes (Signed)
Pt resting in bed at this time. Pt is alert and oriented. Pt on room air. NSR on the monitor. Ambulates independently. Heparin drip started at 18 units/kg/hr. Pt awaiting TEE on Monday. No s&s of distress. Denies chest pain. Will continue to monitor.    Clemencia Course, RN

## 2019-04-19 NOTE — Nurses Notes (Signed)
Pt c/o 6/10 chest pain radiating to left shoulder. EKG obtained. Nitro sublingual given twice and no longer reporting pain. E. Sabbagh called and trop q6h was ordered.

## 2019-04-19 NOTE — Progress Notes (Signed)
Hospitalist   Progress Note    IDAN PRIME       53 y.o.       Date of service: 04/19/2019  Date of Admission:  04/18/2019    Hospital Day:  LOS: 0 days   CC:   Chief Complaint   Patient presents with   . Chest Pain      Pt arrives via [facility] c/o chest pain starting today       Subjective: Patient resting in bed. States his chest pain, sob, and diaphoresis have resolved.      Objective:   Filed Vitals:    04/19/19 0000 04/19/19 0859 04/19/19 1044 04/19/19 1500   BP: (!) 146/98 126/86  101/78   Pulse: 67 85  67   Resp: 20 20  20    Temp: 36.4 C (97.5 F) 36.1 C (96.9 F)  37.5 C (99.5 F)   SpO2: 98% 95% 97% 98%     I have reviewed the vitals.    acetaminophen (TYLENOL) tablet, 650 mg, Oral, Q6H PRN  aspirin chewable tablet 81 mg, 81 mg, Oral, QPM  atorvastatin (LIPITOR) tablet, 80 mg, Oral, QPM  budesonide-formoterol (SYMBICORT) 160 mcg-4.5 mcg per inhalation oral inhaler, 2 Puff, Inhalation, 2x/day  clopidogrel (PLAVIX) 75 mg tablet, 75 mg, Oral, QPM  famotidine (PEPCID) tablet, 10 mg, Oral, 2x/day  heparin 1,000 units/mL initial IV BOLUS, 80 Units/kg (Adjusted), Intravenous, Once  heparin 25,000 units in D5W 250 mL infusion, 18 Units/kg/hr (Adjusted), Intravenous, Continuous  ipratropium-albuterol 0.5 mg-3 mg(2.5 mg base)/3 mL Solution for Nebulization, 3 mL, Nebulization, 4x/day  metoprolol succinate (TOPROL-XL) 24 hr extended release tablet, 100 mg, Oral, Daily  montelukast (SINGULAIR) 10 mg tablet, 10 mg, Oral, QPM  nitroGLYCERIN (NITROSTAT) sublingual tablet, 0.4 mg, Sublingual, Q5 Min PRN  NS flush syringe, 3 mL, Intracatheter, Q8HRS  NS flush syringe, 3 mL, Intracatheter, Q1H PRN  ondansetron (ZOFRAN) 2 mg/mL injection, 4 mg, Intravenous, Q6H PRN  pantoprazole (PROTONIX) delayed release tablet, 20 mg, Oral, QPM        Physical Exam:  Constitutional: Patient is in no acute distress, obese.    HEENT: Head normocephalic and atraumatic.   Lungs: Clear to auscultation bilaterally without wheezes,  rales or rhonchi.  Cardiovascular: Regular rate and rhythm. No murmur, rub, or gallop.   Abdomen: Normal bowel sounds. Soft, nontender, nondistended.   MSK/Extremities: Moves all extremities. Pulses equal bilaterally.  Skin: No lesions noted.  No rashes.  Neuro: Alert and oriented X 3. CNs 2-12 grossly intact. No facial droop noted.        Labs:  Lab Results Today:    Results for orders placed or performed during the hospital encounter of 04/18/19 (from the past 24 hour(s))   TROPONIN-I   Result Value Ref Range    TROPONIN I 0.00 <=0.04 ng/mL   PTT (PARTIAL THROMBOPLASTIN TIME)   Result Value Ref Range    APTT 79.5 (H) 25.0 - 37.0 seconds   BASIC METABOLIC PANEL, NON-FASTING   Result Value Ref Range    SODIUM 139 135 - 145 mmol/L    POTASSIUM 4.0 3.5 - 5.0 mmol/L    CHLORIDE 106 98 - 111 mmol/L    CO2 TOTAL 26 21 - 35 mmol/L    ANION GAP 7 mmol/L    CALCIUM 8.9 8.8 - 10.3 mg/dL    GLUCOSE 99 70 - 04/20/19 mg/dL    BUN 15 10 - 25 mg/dL    CREATININE 203 5.59 mg/dL  BUN/CREA RATIO 15     ESTIMATED GFR >60 Avg: 93 mL/min/1.15m^2   MAGNESIUM   Result Value Ref Range    MAGNESIUM 2.1 1.8 - 2.3 mg/dL   PHOSPHORUS   Result Value Ref Range    PHOSPHORUS 3.5 2.5 - 4.5 mg/dL   PTT (PARTIAL THROMBOPLASTIN TIME)   Result Value Ref Range    APTT 65.3 (H) 25.0 - 37.0 seconds   CBC WITH DIFF   Result Value Ref Range    WBC 6.8 3.3 - 9.3 x10^3/uL    RBC 5.11 4.40 - 5.68 x10^6/uL    HGB 15.9 13.4 - 17.3 g/dL    HCT 44.5 38.9 - 50.5 %    MCV 87.0 82.4 - 95.0 fL    MCH 31.1 27.9 - 33.1 pg    MCHC 35.8 32.8 - 36.0 g/dL    RDW 13.0 10.9 - 15.1 %    PLATELETS 201 140 - 440 x10^3/uL    MPV 8.7 6.0 - 10.2 fL    NEUTROPHIL % 56 %    LYMPHOCYTE % 31 %    MONOCYTE % 11 %    EOSINOPHIL % 2 %    BASOPHIL % 1 %    NEUTROPHIL # 3.80 1.60 - 5.50 x10^3/uL    LYMPHOCYTE # 2.10 0.80 - 3.20 x10^3/uL    MONOCYTE # 0.70 0.20 - 0.80 x10^3/uL    EOSINOPHIL # 0.10 0.00 - 0.50 x10^3/uL    BASOPHIL # 0.00 0.00 - 0.20 x10^3/uL   CBC   Result Value Ref Range       WBC 6.8 3.3 - 9.3 x10^3/uL    RBC 5.50 4.40 - 5.68 x10^6/uL    HGB 16.4 13.4 - 17.3 g/dL    HCT 48.3 38.9 - 50.5 %    MCV 87.8 82.4 - 95.0 fL    MCH 29.9 27.9 - 33.1 pg    MCHC 34.1 32.8 - 36.0 g/dL    RDW 13.2 10.9 - 15.1 %    PLATELETS 220 140 - 440 x10^3/uL    MPV 9.0 6.0 - 10.2 fL   PTT (PARTIAL THROMBOPLASTIN TIME)   Result Value Ref Range    APTT 31.4 25.0 - 37.0 seconds     I have reviewed all labs.    Micro: No results found for any visits on 04/18/19 (from the past 96 hour(s)).    Radiology:       Assessment/ Plan:   Active Hospital Problems    Diagnosis   . Chest pain     Chest pain  CAD status post CABG  Hyperlipidemia  Hypertension  OSA on CPAP  Asthma  Obesity    Plan:  -patient placed on heparin drip by the emergency department 2nd troponin was negative, discontinued heparin drip  -nitroglycerin for chest pain.  If patient has additional symptoms, will repeat troponin and EKG at that time  -check echocardiogram difficult visualization per ECHO, Dr. Bjorn Pippin consulted for recommendations which include starting a heparin gtt and TEE on Monday.   -check lipid panel LDL 80, TSH 1.465, BNP 38  -CPAP for OSA    DVT/PE Prophylaxis: Heparin    Disposition Planning: Home discharge      Alisa Graff, FNP  04/19/2019, 17:51

## 2019-04-19 NOTE — Care Plan (Signed)
No significant events overnight. Pt is A/Ox4 and denies chest pain, SOB, and dizziness. Heparin discontinue after all troponins resulted negative per Dr. Evlyn Clines noted. Pt asked if all of his medication could be taken at night, as this is routine at home per his PCP. All meds were given tonight, except his metoprolol per Dr. Mikeal Hawthorne. No s/s of distress at this time. NSR on telemetry on RA. This patient currently meets requirements for low to mid level nursing care. The patient will continue to be monitored and re-evaluated for any changes in condition.    Edyth Gunnels, RN

## 2019-04-20 ENCOUNTER — Inpatient Hospital Stay (HOSPITAL_COMMUNITY): Payer: MEDICAID

## 2019-04-20 LAB — ECG 12 LEAD - ADULT
Calculated P Axis: 49 deg
Calculated T Axis: 8 deg
Heart Rate: 86 {beats}/min
I 40 Axis: -27 deg
PR Interval: 173 ms
QRS Axis: 121 deg
QRS Duration: 100 ms
QT Interval: 363 ms
QTC Calculation: 434 ms
ST Axis: 28 deg
T 40 Axis: 133 deg

## 2019-04-20 LAB — BASIC METABOLIC PANEL, FASTING
ANION GAP: 10 mmol/L
BUN/CREA RATIO: 15
BUN: 15 mg/dL (ref 10–25)
CALCIUM: 8.6 mg/dL — ABNORMAL LOW (ref 8.8–10.3)
CHLORIDE: 107 mmol/L (ref 98–111)
CO2 TOTAL: 25 mmol/L (ref 21–35)
CREATININE: 1 mg/dL (ref <=1.30)
ESTIMATED GFR: >60 mL/min/{1.73_m2}
GLUCOSE: 103 mg/dL (ref 70–110)
POTASSIUM: 3.9 mmol/L (ref 3.5–5.0)
SODIUM: 142 mmol/L (ref 135–145)

## 2019-04-20 LAB — CBC WITH DIFF
BASOPHIL #: 0.1 10*3/uL (ref 0.00–0.20)
BASOPHIL %: 1 %
EOSINOPHIL #: 0.1 10*3/uL (ref 0.00–0.50)
EOSINOPHIL %: 2 %
HCT: 44.8 % (ref 38.9–50.5)
HGB: 15.5 g/dL (ref 13.4–17.3)
LYMPHOCYTE #: 2.2 10*3/uL (ref 0.80–3.20)
LYMPHOCYTE %: 29 %
MCH: 30.1 pg (ref 27.9–33.1)
MCHC: 34.5 g/dL (ref 32.8–36.0)
MCV: 87.1 fL (ref 82.4–95.0)
MONOCYTE #: 0.8 10*3/uL (ref 0.20–0.80)
MONOCYTE %: 10 %
MPV: 8.7 fL (ref 6.0–10.2)
NEUTROPHIL #: 4.6 10*3/uL (ref 1.60–5.50)
NEUTROPHIL %: 59 %
PLATELETS: 195 10*3/uL (ref 140–440)
RBC: 5.14 10*6/uL (ref 4.40–5.68)
RDW: 13.1 % (ref 10.9–15.1)
WBC: 7.8 10*3/uL (ref 3.3–9.3)

## 2019-04-20 LAB — BASIC METABOLIC PANEL
ANION GAP: 9 mmol/L
BUN/CREA RATIO: 16
BUN: 16 mg/dL (ref 10–25)
CALCIUM: 8.7 mg/dL — ABNORMAL LOW (ref 8.8–10.3)
CHLORIDE: 107 mmol/L (ref 98–111)
CO2 TOTAL: 24 mmol/L (ref 21–35)
CREATININE: 0.97 mg/dL (ref <=1.30)
ESTIMATED GFR: >60 mL/min/{1.73_m2}
GLUCOSE: 105 mg/dL (ref 70–110)
POTASSIUM: 3.8 mmol/L (ref 3.5–5.0)
SODIUM: 140 mmol/L (ref 135–145)

## 2019-04-20 LAB — PTT (PARTIAL THROMBOPLASTIN TIME)
APTT: >200 s (ref 25.0–37.0)
APTT: 133.1 s — ABNORMAL HIGH (ref 25.0–37.0)
APTT: 51.4 s — ABNORMAL HIGH (ref 25.0–37.0)
APTT: 72 s — ABNORMAL HIGH (ref 25.0–37.0)
APTT: 72.2 s — ABNORMAL HIGH (ref 25.0–37.0)

## 2019-04-20 LAB — HGA1C (HEMOGLOBIN A1C WITH EST AVG GLUCOSE)
ESTIMATED AVERAGE GLUCOSE: 123 mg/dL
HEMOGLOBIN A1C: 5.9 % — ABNORMAL HIGH (ref 4.1–5.7)

## 2019-04-20 LAB — MAGNESIUM: MAGNESIUM: 2.1 mg/dL (ref 1.8–2.3)

## 2019-04-20 LAB — PHOSPHORUS: PHOSPHORUS: 4.3 mg/dL (ref 2.5–4.5)

## 2019-04-20 LAB — TROPONIN-I: TROPONIN I: 0 ng/mL (ref <=0.04)

## 2019-04-20 MED ORDER — MELATONIN 3 MG TABLET
6.00 mg | ORAL_TABLET | Freq: Every evening | ORAL | Status: DC | PRN
Start: 2019-04-20 — End: 2019-04-22
  Administered 2019-04-20: 6 mg via ORAL
  Filled 2019-04-20 (×2): qty 2

## 2019-04-20 MED ORDER — SODIUM CHLORIDE 0.9 % INJECTION SOLUTION
10.0000 mL | INTRAMUSCULAR | Status: DC
Start: 2019-04-20 — End: 2019-04-22

## 2019-04-20 MED ORDER — METOPROLOL SUCCINATE ER 100 MG TABLET,EXTENDED RELEASE 24 HR
100.00 mg | ORAL_TABLET | Freq: Every evening | ORAL | Status: DC
Start: 2019-04-21 — End: 2019-04-22
  Administered 2019-04-21: 100 mg via ORAL
  Filled 2019-04-20 (×3): qty 1

## 2019-04-20 MED ORDER — IOVERSOL 320 MG IODINE/ML INTRAVENOUS SOLUTION
120.0000 mL | INTRAVENOUS | Status: AC
Start: 2019-04-20 — End: 2019-04-20
  Administered 2019-04-20: 20:00:00 120 mL via INTRAVENOUS

## 2019-04-20 NOTE — Progress Notes (Signed)
Hospitalist   Progress Note    [name] [name]       53 y.o.       Date of service: 04/20/2019  Date of Admission:  04/18/2019    Hospital Day:  LOS: 0 days   CC:   Chief Complaint   Patient presents with   . Chest Pain      Pt arrives via [facility] c/o chest pain starting today       Subjective: Patient resting in bed. States that he has had some intermittent chest pain which has resolved with prn nitro. Continue heparin drip and plan for TEE and MPS Monday.     Objective:   Filed Vitals:    04/19/19 2153 04/20/19 0400 04/20/19 0748 04/20/19 1011   BP: 130/90 (!) 144/81  (!) 123/94   Pulse:  81  89   Resp:  18  18   Temp:  36.6 C (97.9 F)  36.4 C (97.5 F)   SpO2:  100% 98% 98%     I have reviewed the vitals.    acetaminophen (TYLENOL) tablet, 650 mg, Oral, Q6H PRN  aspirin chewable tablet 81 mg, 81 mg, Oral, QPM  atorvastatin (LIPITOR) tablet, 80 mg, Oral, QPM  budesonide-formoterol (SYMBICORT) 160 mcg-4.5 mcg per inhalation oral inhaler, 2 Puff, Inhalation, 2x/day  clopidogrel (PLAVIX) 75 mg tablet, 75 mg, Oral, QPM  famotidine (PEPCID) tablet, 10 mg, Oral, 2x/day  heparin 25,000 units in D5W 250 mL infusion, 18 Units/kg/hr (Adjusted), Intravenous, Continuous  ipratropium-albuterol 0.5 mg-3 mg(2.5 mg base)/3 mL Solution for Nebulization, 3 mL, Nebulization, 4x/day  metoprolol succinate (TOPROL-XL) 24 hr extended release tablet, 100 mg, Oral, Daily  montelukast (SINGULAIR) 10 mg tablet, 10 mg, Oral, QPM  nitroGLYCERIN (NITROSTAT) sublingual tablet, 0.4 mg, Sublingual, Q5 Min PRN  NS flush syringe, 3 mL, Intracatheter, Q8HRS  NS flush syringe, 3 mL, Intracatheter, Q1H PRN  ondansetron (ZOFRAN) 2 mg/mL injection, 4 mg, Intravenous, Q6H PRN  pantoprazole (PROTONIX) delayed release tablet, 20 mg, Oral, QPM        Physical Exam:  Constitutional: Patient is in no acute distress, obese, pleasant.    HEENT: Head normocephalic and atraumatic.   Lungs: Clear to auscultation bilaterally without wheezes, rales or  rhonchi.  Cardiovascular: Regular rate and rhythm. No murmur, rub, or gallop.   Abdomen: Normal bowel sounds. Soft, nontender, nondistended.   MSK/Extremities: Moves all extremities. Pulses equal bilaterally.  Skin: No lesions noted.  No rashes.  Neuro: Alert and oriented X 3. CNs 2-12 grossly intact. No facial droop noted.        Labs:  Lab Results Today:    Results for orders placed or performed during the hospital encounter of 04/18/19 (from the past 24 hour(s))   CBC   Result Value Ref Range    WBC 6.8 3.3 - 9.3 x10^3/uL    RBC 5.50 4.40 - 5.68 x10^6/uL    HGB 16.4 13.4 - 17.3 g/dL    HCT 48.3 38.9 - 50.5 %    MCV 87.8 82.4 - 95.0 fL    MCH 29.9 27.9 - 33.1 pg    MCHC 34.1 32.8 - 36.0 g/dL    RDW 13.2 10.9 - 15.1 %    PLATELETS 220 140 - 440 x10^3/uL    MPV 9.0 6.0 - 10.2 fL   PTT (PARTIAL THROMBOPLASTIN TIME)   Result Value Ref Range    APTT 31.4 25.0 - 37.0 seconds   ECG 12 LEAD - ADULT   Result  Value Ref Range    Heart Rate 86 BPM    PR Interval 173 ms    QRS Duration 100 ms    QT Interval 363 ms    QTC Calculation 434 ms    Calculated P Axis 49 deg    QRS Axis 121 deg    Calculated T Axis 8 deg    I 40 Axis -27 deg    T 40 Axis 133 deg    ST Axis 28 deg    EKG Severity - OTHERWISE NORMAL ECG -    TROPONIN-I   Result Value Ref Range    TROPONIN I 0.00 <=0.04 ng/mL   PTT (PARTIAL THROMBOPLASTIN TIME)   Result Value Ref Range    APTT >200.0 (HH) 25.0 - 37.0 seconds   BASIC METABOLIC PANEL   Result Value Ref Range    SODIUM 140 135 - 145 mmol/L    POTASSIUM 3.8 3.5 - 5.0 mmol/L    CHLORIDE 107 98 - 111 mmol/L    CO2 TOTAL 24 21 - 35 mmol/L    ANION GAP 9 mmol/L    CALCIUM 8.7 (L) 8.8 - 10.3 mg/dL    GLUCOSE 888 70 - 916 mg/dL    BUN 16 10 - 25 mg/dL    CREATININE 9.45 <=0.38 mg/dL    BUN/CREA RATIO 16     ESTIMATED GFR >60 Avg: 93 mL/min/1.36m^2   MAGNESIUM   Result Value Ref Range    MAGNESIUM 2.1 1.8 - 2.3 mg/dL   PHOSPHORUS   Result Value Ref Range    PHOSPHORUS 4.3 2.5 - 4.5 mg/dL   BASIC METABOLIC PANEL,  FASTING   Result Value Ref Range    SODIUM 142 135 - 145 mmol/L    POTASSIUM 3.9 3.5 - 5.0 mmol/L    CHLORIDE 107 98 - 111 mmol/L    CO2 TOTAL 25 21 - 35 mmol/L    ANION GAP 10 mmol/L    CALCIUM 8.6 (L) 8.8 - 10.3 mg/dL    GLUCOSE 882 70 - 800 mg/dL    BUN 15 10 - 25 mg/dL    CREATININE 3.49 <=1.79 mg/dL    BUN/CREA RATIO 15     ESTIMATED GFR >60 Avg: 93 mL/min/1.61m^2   CBC WITH DIFF   Result Value Ref Range    WBC 7.8 3.3 - 9.3 x10^3/uL    RBC 5.14 4.40 - 5.68 x10^6/uL    HGB 15.5 13.4 - 17.3 g/dL    HCT 15.0 56.9 - 79.4 %    MCV 87.1 82.4 - 95.0 fL    MCH 30.1 27.9 - 33.1 pg    MCHC 34.5 32.8 - 36.0 g/dL    RDW 80.1 65.5 - 37.4 %    PLATELETS 195 140 - 440 x10^3/uL    MPV 8.7 6.0 - 10.2 fL    NEUTROPHIL % 59 %    LYMPHOCYTE % 29 %    MONOCYTE % 10 %    EOSINOPHIL % 2 %    BASOPHIL % 1 %    NEUTROPHIL # 4.60 1.60 - 5.50 x10^3/uL    LYMPHOCYTE # 2.20 0.80 - 3.20 x10^3/uL    MONOCYTE # 0.80 0.20 - 0.80 x10^3/uL    EOSINOPHIL # 0.10 0.00 - 0.50 x10^3/uL    BASOPHIL # 0.10 0.00 - 0.20 x10^3/uL   PTT (PARTIAL THROMBOPLASTIN TIME)   Result Value Ref Range    APTT 133.1 (H) 25.0 - 37.0 seconds   PTT (PARTIAL THROMBOPLASTIN TIME)  Result Value Ref Range    APTT 51.4 (H) 25.0 - 37.0 seconds   PTT (PARTIAL THROMBOPLASTIN TIME)   Result Value Ref Range    APTT 72.0 (H) 25.0 - 37.0 seconds     I have reviewed all labs.    Micro: No results found for any visits on 04/18/19 (from the past 96 hour(s)).    Radiology:       Assessment/ Plan:   Active Hospital Problems    Diagnosis   . Chest pain     Chest pain  CAD status post CABG  Hyperlipidemia  Hypertension  OSA on CPAP  Asthma  Obesity  Concern for vegetation versus thrombus       Plan:  -patient placed on heparin drip by the emergency department 2nd troponin was negative, discontinued heparin drip  -nitroglycerin for chest pain.  If patient has additional symptoms, will repeat troponin and EKG at that time  -check echocardiogram echodensity in the right ventricle cannot  rule out vegetation vs thrombus, Dr. Lilla ShookE Sabbagh  continue heparin gtt  -check lipid panel LDL 80, TSH 1.465, BNP 38  -CPAP for OSA  -MPS Monday   -TEE Monday   -obtain endocarditis blood cultures, monitor     DVT/PE Prophylaxis: Heparin    Disposition Planning: Home discharge      Rosalita LevanLauren A Trozzi, FNP  04/20/2019, 14:47

## 2019-04-20 NOTE — Progress Notes (Signed)
[facility]  Cardiology   Progress Note      [name] [name], [name] [name]  Date of Admission:  04/18/2019  Date of service: 04/20/2019  Date of Birth:    MRN:  [id number]    Hospital Day:  LOS: 0 days     Assessment/ Plan:   1. Patient had chest pain  Last night   The CT scan of the chest was ordered to rule out PE or other vascular abnormalities  2. CAD rule out significant worsening coronary artery disease status post CABG   3. Abnormal echocardiogram patient for TEE  4. Hypertension 5. 5. Hyperlipidemia patient on Lipitor   continue current treatment monitor closely EKG without acute ischemia    Active Hospital Problems    Diagnosis   . Chest pain    subjective   The patient had chest pain last now today not having a chest we have tightness in his chest  The patient denied any nausea or vomiting          Current Medications:  acetaminophen (TYLENOL) tablet, 650 mg, Oral, Q6H PRN  aspirin chewable tablet 81 mg, 81 mg, Oral, QPM  atorvastatin (LIPITOR) tablet, 80 mg, Oral, QPM  clopidogrel (PLAVIX) 75 mg tablet, 75 mg, Oral, QPM  famotidine (PEPCID) tablet, 10 mg, Oral, 2x/day  heparin 25,000 units in D5W 250 mL infusion, 18 Units/kg/hr (Adjusted), Intravenous, Continuous  ipratropium-albuterol 0.5 mg-3 mg(2.5 mg base)/3 mL Solution for Nebulization, 3 mL, Nebulization, 4x/day  melatonin tablet, 6 mg, Oral, HS PRN  [START ON 04/21/2019] metoprolol succinate (TOPROL-XL) 24 hr extended release tablet, 100 mg, Oral, NIGHTLY  montelukast (SINGULAIR) 10 mg tablet, 10 mg, Oral, QPM  nitroGLYCERIN (NITROSTAT) sublingual tablet, 0.4 mg, Sublingual, Q5 Min PRN  NS 10 mL injection, 10 mL, Intravenous, Give in Radiology  NS flush syringe, 3 mL, Intracatheter, Q8HRS  NS flush syringe, 3 mL, Intracatheter, Q1H PRN  ondansetron (ZOFRAN) 2 mg/mL injection, 4 mg, Intravenous, Q6H PRN  pantoprazole (PROTONIX) delayed release tablet, 20 mg, Oral, QPM        Allergies   Allergen Reactions   . Phenergan [Promethazine] Seizure              Review system  Otherwise negative    Vital Signs:  All vital signs reviewed    Physical exam  Patient alert oriented x3 not in acute distress  HEENT unremarkable  Neck no JVD or bruit carotid upstroke within normal limit neck is supple  Lungs clear no rales rhonchi or wheezing  Heart Regular rhythm no gallop, rub or murmur     Abdomen soft nontender no masses or organomegaly, bowel sounds positive, no masses.  Extremities no cyanosis, clubbing or edema  Skin no rash.  Neuro grossly within normal limit no focal finding      Temp (24hrs) Max:37 C (98.6 F)      Temperature: 36.9 C (98.4 F) (04/20/19 1953)  BP (Non-Invasive): 115/76 (04/20/19 1953)  MAP (Non-Invasive): 90 mmHG (04/20/19 1953)  Heart Rate: 85 (04/20/19 1953)  Respiratory Rate: 18 (04/20/19 1953)  Pain Score (Numeric, Faces): 0 (04/20/19 1953)  SpO2: 98 % (04/20/19 1953)      I/O last 24 hours:      Intake/Output Summary (Last 24 hours) at 04/20/2019 2336  Last data filed at 04/20/2019 1800  Gross per 24 hour   Intake 600 ml   Output --   Net 600 ml     I/O current shift:  No intake/output data recorded.  No results for input(s): GLUIP in the last 48 hours.            Labs:  All Lab Results reviewed by myself   for Last 24 Hours:    Results for orders placed or performed during the hospital encounter of 04/18/19 (from the past 24 hour(s))   TROPONIN-I   Result Value Ref Range    TROPONIN I 0.00 <=0.04 ng/mL   PTT (PARTIAL THROMBOPLASTIN TIME)   Result Value Ref Range    APTT >200.0 (HH) 25.0 - 37.0 seconds   BASIC METABOLIC PANEL   Result Value Ref Range    SODIUM 140 135 - 145 mmol/L    POTASSIUM 3.8 3.5 - 5.0 mmol/L    CHLORIDE 107 98 - 111 mmol/L    CO2 TOTAL 24 21 - 35 mmol/L    ANION GAP 9 mmol/L    CALCIUM 8.7 (L) 8.8 - 10.3 mg/dL    GLUCOSE 308105 70 - 657110 mg/dL    BUN 16 10 - 25 mg/dL    CREATININE 8.460.97 <=9.62<=1.30 mg/dL    BUN/CREA RATIO 16     ESTIMATED GFR >60 Avg: 93 mL/min/1.3142m^2   MAGNESIUM   Result Value Ref Range    MAGNESIUM 2.1  1.8 - 2.3 mg/dL   PHOSPHORUS   Result Value Ref Range    PHOSPHORUS 4.3 2.5 - 4.5 mg/dL   BASIC METABOLIC PANEL, FASTING   Result Value Ref Range    SODIUM 142 135 - 145 mmol/L    POTASSIUM 3.9 3.5 - 5.0 mmol/L    CHLORIDE 107 98 - 111 mmol/L    CO2 TOTAL 25 21 - 35 mmol/L    ANION GAP 10 mmol/L    CALCIUM 8.6 (L) 8.8 - 10.3 mg/dL    GLUCOSE 952103 70 - 841110 mg/dL    BUN 15 10 - 25 mg/dL    CREATININE 3.241.00 <=4.01<=1.30 mg/dL    BUN/CREA RATIO 15     ESTIMATED GFR >60 Avg: 93 mL/min/1.8042m^2   CBC WITH DIFF   Result Value Ref Range    WBC 7.8 3.3 - 9.3 x10^3/uL    RBC 5.14 4.40 - 5.68 x10^6/uL    HGB 15.5 13.4 - 17.3 g/dL    HCT 02.744.8 25.338.9 - 66.450.5 %    MCV 87.1 82.4 - 95.0 fL    MCH 30.1 27.9 - 33.1 pg    MCHC 34.5 32.8 - 36.0 g/dL    RDW 40.313.1 47.410.9 - 25.915.1 %    PLATELETS 195 140 - 440 x10^3/uL    MPV 8.7 6.0 - 10.2 fL    NEUTROPHIL % 59 %    LYMPHOCYTE % 29 %    MONOCYTE % 10 %    EOSINOPHIL % 2 %    BASOPHIL % 1 %    NEUTROPHIL # 4.60 1.60 - 5.50 x10^3/uL    LYMPHOCYTE # 2.20 0.80 - 3.20 x10^3/uL    MONOCYTE # 0.80 0.20 - 0.80 x10^3/uL    EOSINOPHIL # 0.10 0.00 - 0.50 x10^3/uL    BASOPHIL # 0.10 0.00 - 0.20 x10^3/uL   PTT (PARTIAL THROMBOPLASTIN TIME)   Result Value Ref Range    APTT 133.1 (H) 25.0 - 37.0 seconds   PTT (PARTIAL THROMBOPLASTIN TIME)   Result Value Ref Range    APTT 51.4 (H) 25.0 - 37.0 seconds   PTT (PARTIAL THROMBOPLASTIN TIME)   Result Value Ref Range    APTT 72.0 (H) 25.0 - 37.0 seconds   PTT (PARTIAL  THROMBOPLASTIN TIME)   Result Value Ref Range    APTT 72.2 (H) 25.0 - 37.0 seconds         Veatrice Kells, MD      This note may have been partially generated using MModal Fluency Direct system, and there may be some incorrect words, spellings, and punctuation that were not noted in checking the note before saving.

## 2019-04-20 NOTE — Care Plan (Signed)
Pt is A&O. Running NSR on RA. Ambulates in room independently. Pt complained of Chest Pain last night. Nitro given x2 with relief. EKG obtained. Troponin ordered q6h for 24hours per E. Sabbagh. No further complains of chest pain overnight. Pt slept throughout the night. This patient currently meets requirements for low to mid level nursing care. The patient will continue to be monitored and re-evaluated for any changes in condition.

## 2019-04-21 LAB — CBC WITH DIFF
BASOPHIL #: 0 10*3/uL (ref 0.00–0.20)
BASOPHIL %: 0 %
EOSINOPHIL #: 0.1 10*3/uL (ref 0.00–0.50)
EOSINOPHIL %: 1 %
HCT: 46.9 % (ref 38.9–50.5)
HGB: 15.8 g/dL (ref 13.4–17.3)
LYMPHOCYTE #: 1.8 10*3/uL (ref 0.80–3.20)
LYMPHOCYTE %: 26 %
MCH: 29.6 pg (ref 27.9–33.1)
MCHC: 33.6 g/dL (ref 32.8–36.0)
MCV: 88 fL (ref 82.4–95.0)
MONOCYTE #: 0.6 10*3/uL (ref 0.20–0.80)
MONOCYTE %: 9 %
MPV: 9 fL (ref 6.0–10.2)
NEUTROPHIL #: 4.3 10*3/uL (ref 1.60–5.50)
NEUTROPHIL %: 63 %
PLATELETS: 205 10*3/uL (ref 140–440)
RBC: 5.33 10*6/uL (ref 4.40–5.68)
RDW: 13.4 % (ref 10.9–15.1)
WBC: 6.8 10*3/uL (ref 3.3–9.3)

## 2019-04-21 LAB — BASIC METABOLIC PANEL
ANION GAP: 6 mmol/L
BUN/CREA RATIO: 18
BUN: 18 mg/dL (ref 10–25)
CALCIUM: 9.1 mg/dL (ref 8.8–10.3)
CHLORIDE: 105 mmol/L (ref 98–111)
CO2 TOTAL: 26 mmol/L (ref 21–35)
CREATININE: 1.01 mg/dL (ref <=1.30)
ESTIMATED GFR: >60 mL/min/{1.73_m2}
GLUCOSE: 149 mg/dL — ABNORMAL HIGH (ref 70–110)
POTASSIUM: 4.3 mmol/L (ref 3.5–5.0)
SODIUM: 137 mmol/L (ref 135–145)

## 2019-04-21 LAB — PHOSPHORUS: PHOSPHORUS: 2.7 mg/dL (ref 2.5–4.5)

## 2019-04-21 LAB — PTT (PARTIAL THROMBOPLASTIN TIME)
APTT: 76.3 s — ABNORMAL HIGH (ref 25.0–37.0)
APTT: 95 seconds — ABNORMAL HIGH (ref 25.0–37.0)

## 2019-04-21 LAB — MAGNESIUM: MAGNESIUM: 1.9 mg/dL (ref 1.8–2.3)

## 2019-04-21 NOTE — Progress Notes (Signed)
Hospitalist   Progress Note    KEYDEN PAVLOV       53 y.o.       Date of service: 04/21/2019  Date of Admission:  04/18/2019    Hospital Day:  LOS: 1 day   CC:   Chief Complaint   Patient presents with   . Chest Pain      Pt arrives via [facility] c/o chest pain starting today       Subjective: Patient sitting in bed. He is anxious for discharge home. He is happy to know that he doesn't have a blood clot in his lung. He hasn't had any further chest pain.     Objective:   Filed Vitals:    04/20/19 1953 04/21/19 0500 04/21/19 0700 04/21/19 0758   BP: 115/76 138/87  125/81   Pulse: 85 77  82   Resp: 18 19  18    Temp: 36.9 C (98.4 F) 36.4 C (97.5 F)  36.5 C (97.7 F)   SpO2: 98%  96% 95%     I have reviewed the vitals.    acetaminophen (TYLENOL) tablet, 650 mg, Oral, Q6H PRN  aspirin chewable tablet 81 mg, 81 mg, Oral, QPM  atorvastatin (LIPITOR) tablet, 80 mg, Oral, QPM  clopidogrel (PLAVIX) 75 mg tablet, 75 mg, Oral, QPM  famotidine (PEPCID) tablet, 10 mg, Oral, 2x/day  heparin 25,000 units in D5W 250 mL infusion, 18 Units/kg/hr (Adjusted), Intravenous, Continuous  ipratropium-albuterol 0.5 mg-3 mg(2.5 mg base)/3 mL Solution for Nebulization, 3 mL, Nebulization, 4x/day  melatonin tablet, 6 mg, Oral, HS PRN  metoprolol succinate (TOPROL-XL) 24 hr extended release tablet, 100 mg, Oral, NIGHTLY  montelukast (SINGULAIR) 10 mg tablet, 10 mg, Oral, QPM  nitroGLYCERIN (NITROSTAT) sublingual tablet, 0.4 mg, Sublingual, Q5 Min PRN  NS 10 mL injection, 10 mL, Intravenous, Give in Radiology  NS flush syringe, 3 mL, Intracatheter, Q8HRS  NS flush syringe, 3 mL, Intracatheter, Q1H PRN  ondansetron (ZOFRAN) 2 mg/mL injection, 4 mg, Intravenous, Q6H PRN  pantoprazole (PROTONIX) delayed release tablet, 20 mg, Oral, QPM        Physical Exam: no change in exam   Constitutional: Patient is in no acute distress, obese, pleasant.    HEENT: Head normocephalic and atraumatic.   Lungs: Clear to auscultation bilaterally without  wheezes, rales or rhonchi.  Cardiovascular: Regular rate and rhythm. No murmur, rub, or gallop.   Abdomen: Normal bowel sounds. Soft, nontender, nondistended.   MSK/Extremities: Moves all extremities. Pulses equal bilaterally.  Skin: No lesions noted.  No rashes.  Neuro: Alert and oriented X 3. CNs 2-12 grossly intact. No facial droop noted.        Labs:  Lab Results Today:    Results for orders placed or performed during the hospital encounter of 04/18/19 (from the past 24 hour(s))   PTT (PARTIAL THROMBOPLASTIN TIME)   Result Value Ref Range    APTT 72.2 (H) 25.0 - 37.0 seconds   PTT (PARTIAL THROMBOPLASTIN TIME)   Result Value Ref Range    APTT 76.3 (H) 25.0 - 37.0 seconds   BASIC METABOLIC PANEL   Result Value Ref Range    SODIUM 137 135 - 145 mmol/L    POTASSIUM 4.3 3.5 - 5.0 mmol/L    CHLORIDE 105 98 - 111 mmol/L    CO2 TOTAL 26 21 - 35 mmol/L    ANION GAP 6 mmol/L    CALCIUM 9.1 8.8 - 10.3 mg/dL    GLUCOSE 04/20/19 (H) 70 -  110 mg/dL    BUN 18 10 - 25 mg/dL    CREATININE 1.01 <=1.30 mg/dL    BUN/CREA RATIO 18     ESTIMATED GFR >60 Avg: 93 mL/min/1.56m^2   MAGNESIUM   Result Value Ref Range    MAGNESIUM 1.9 1.8 - 2.3 mg/dL   PHOSPHORUS   Result Value Ref Range    PHOSPHORUS 2.7 2.5 - 4.5 mg/dL   CBC WITH DIFF   Result Value Ref Range    WBC 6.8 3.3 - 9.3 x10^3/uL    RBC 5.33 4.40 - 5.68 x10^6/uL    HGB 15.8 13.4 - 17.3 g/dL    HCT 46.9 38.9 - 50.5 %    MCV 88.0 82.4 - 95.0 fL    MCH 29.6 27.9 - 33.1 pg    MCHC 33.6 32.8 - 36.0 g/dL    RDW 13.4 10.9 - 15.1 %    PLATELETS 205 140 - 440 x10^3/uL    MPV 9.0 6.0 - 10.2 fL    NEUTROPHIL % 63 %    LYMPHOCYTE % 26 %    MONOCYTE % 9 %    EOSINOPHIL % 1 %    BASOPHIL % 0 %    NEUTROPHIL # 4.30 1.60 - 5.50 x10^3/uL    LYMPHOCYTE # 1.80 0.80 - 3.20 x10^3/uL    MONOCYTE # 0.60 0.20 - 0.80 x10^3/uL    EOSINOPHIL # 0.10 0.00 - 0.50 x10^3/uL    BASOPHIL # 0.00 0.00 - 0.20 x10^3/uL   PTT (PARTIAL THROMBOPLASTIN TIME)   Result Value Ref Range    APTT 95.0 (H) 25.0 - 37.0 seconds          I have reviewed all labs.    Micro: No results found for any visits on 04/18/19 (from the past 96 hour(s)).    Radiology:    Results for orders placed or performed during the hospital encounter of 04/18/19 (from the past 24 hour(s))   CT ANGIO CHEST FOR PULMONARY EMBOLUS W IV CONTRAST     Status: None    Narrative    Ekansh A Augusta    PROCEDURE DESCRIPTION: CT ANGIO CHEST FOR PULMONARY EMBOLUS W IV CONTRAST    CTA chest was performed to evaluate for pulmonary embolism with 3D/MIP  reconstructions generated.    CLINICAL INDICATION: sob    COMPARISON: CT chest April 09, 2012      FINDINGS: There are post surgical changes within the mediastinum. There is  a coronary artery calcification and/or stent. Heart size is normal. There  is no pericardial effusion. There are no filling defects within the  pulmonary arteries to suggest pulmonary embolus. There is no thoracic  aortic aneurysm nor dissection. There is no pneumothorax nor  pneumomediastinum. There is small bandlike subpleural atelectasis or  scarring within the posterior lateral left lower lobe and lingula. There is  no pleural effusion. There is no pulmonary nodule or mass.      Impression    1. No pulmonary embolus by CTA.  2. Small bandlike atelectasis or scarring posterior lateral left lower lobe  and lingula.  3. Postsurgical change mediastinum as well as coronary artery calcification  and/or stent.        The CT exam was performed using one or more the following a dose reduction  techniques: Automated exposure control, adjustment of the mA and/or kV  according to the patient's size, or use of iterative reconstruction  technique.        Radiologist location ID: [id number]  Assessment/ Plan:   Active Hospital Problems    Diagnosis   . Chest pain     Chest pain  -patient placed on heparin drip by the emergency department 2nd troponin was negative, discontinued heparin drip  -nitroglycerin for chest pain.  If patient has additional symptoms, will repeat  troponin and EKG at that time  -check echocardiogram echodensity in the right ventricle cannot rule out vegetation vs thrombus, -Dr. Courtney HeysSabbagh following   -check lipid panel LDL 80, TSH 1.465, BNP 38  -had chest pain last night and CTA PE protocol was obtained and negative for PE     CAD status post CABG     Hyperlipidemia  -LDL 80  -continue statin     Hypertension    OSA on CPAP  -CPAP    Asthma    Obesity    Concern for vegetation versus thrombus   -check echocardiogram echodensity in the right ventricle cannot rule out vegetation vs thrombus  -start thrombotic protocol heparin gtt  -MPS Monday   -TEE Monday   -obtain endocarditis blood cultures, monitor     DVT/PE Prophylaxis: Heparin    Disposition Planning: Home discharge      Rosalita LevanLauren A Suhey Radford, FNP  04/21/2019, 12:57

## 2019-04-21 NOTE — Progress Notes (Signed)
[facility]  Cardiology   Progress Note      [name] [name], [name] [name]  Date of Admission:  04/18/2019  Date of service: 04/21/2019  Date of Birth:    MRN:  [id number]    Hospital Day:  LOS: 1 day     Assessment/ Plan:   1. Chest pain with known coronary artery disease the patient history of bypass surgery with LIMA to the LAD only  The patient had a heart catheterization about a year ago showed the lima was patent and no significant other disease   2. Abnormal echocardiogram with do a TEE   3. Hypertension stable 4. Hyperlipidemia continue Lipitor  5. The overweight  The patient otherwise be maintained on current treatment will monitor closely the patient willing to proceed with a TEE and stress test  Active Hospital Problems    Diagnosis   . Chest pain      subjective   Chest pain improved   The patient denied any nausea or vomiting denied any fever or chills          Current Medications:  acetaminophen (TYLENOL) tablet, 650 mg, Oral, Q6H PRN  aspirin chewable tablet 81 mg, 81 mg, Oral, QPM  atorvastatin (LIPITOR) tablet, 80 mg, Oral, QPM  clopidogrel (PLAVIX) 75 mg tablet, 75 mg, Oral, QPM  famotidine (PEPCID) tablet, 10 mg, Oral, 2x/day  heparin 25,000 units in D5W 250 mL infusion, 18 Units/kg/hr (Adjusted), Intravenous, Continuous  ipratropium-albuterol 0.5 mg-3 mg(2.5 mg base)/3 mL Solution for Nebulization, 3 mL, Nebulization, 4x/day  melatonin tablet, 6 mg, Oral, HS PRN  metoprolol succinate (TOPROL-XL) 24 hr extended release tablet, 100 mg, Oral, NIGHTLY  montelukast (SINGULAIR) 10 mg tablet, 10 mg, Oral, QPM  nitroGLYCERIN (NITROSTAT) sublingual tablet, 0.4 mg, Sublingual, Q5 Min PRN  NS 10 mL injection, 10 mL, Intravenous, Give in Radiology  NS flush syringe, 3 mL, Intracatheter, Q8HRS  NS flush syringe, 3 mL, Intracatheter, Q1H PRN  ondansetron (ZOFRAN) 2 mg/mL injection, 4 mg, Intravenous, Q6H PRN  pantoprazole (PROTONIX) delayed release tablet, 20 mg, Oral, QPM        Allergies   Allergen  Reactions   . Phenergan [Promethazine] Seizure             Review system  Otherwise negative    Vital Signs:  All vital signs reviewed    Physical exam  Patient alert oriented x3 not in acute distress  HEENT unremarkable  Neck no JVD or bruit carotid upstroke within normal limit neck is supple  Lungs clear no rales rhonchi or wheezing  Heart Regular rhythm no gallop, rub or murmur     Abdomen soft nontender no masses or organomegaly, bowel sounds positive, no masses.  Extremities no cyanosis, clubbing or edema  Skin no rash.  Neuro grossly within normal limit no focal finding      Temp (24hrs) Max:37 C (98.6 F)      Temperature: 36.5 C (97.7 F) (04/21/19 0758)  BP (Non-Invasive): 125/81 (04/21/19 0758)  MAP (Non-Invasive): 90 mmHG (04/20/19 1953)  Heart Rate: 82 (04/21/19 0758)  Respiratory Rate: 18 (04/21/19 0758)  Pain Score (Numeric, Faces): 0 (04/21/19 0758)  SpO2: 95 % (04/21/19 0758)      I/O last 24 hours:      Intake/Output Summary (Last 24 hours) at 04/21/2019 1052  Last data filed at 04/21/2019 1000  Gross per 24 hour   Intake 480 ml   Output --   Net 480 ml     I/O  current shift:  11/01 0700 - 11/01 1859  In: 240 [P.O.:240]  Out: -   No results for input(s): GLUIP in the last 48 hours.            Labs:  All Lab Results reviewed by myself   for Last 24 Hours:    Results for orders placed or performed during the hospital encounter of 04/18/19 (from the past 24 hour(s))   PTT (PARTIAL THROMBOPLASTIN TIME)   Result Value Ref Range    APTT 72.0 (H) 25.0 - 37.0 seconds   PTT (PARTIAL THROMBOPLASTIN TIME)   Result Value Ref Range    APTT 72.2 (H) 25.0 - 37.0 seconds   PTT (PARTIAL THROMBOPLASTIN TIME)   Result Value Ref Range    APTT 76.3 (H) 25.0 - 37.0 seconds   BASIC METABOLIC PANEL   Result Value Ref Range    SODIUM 137 135 - 145 mmol/L    POTASSIUM 4.3 3.5 - 5.0 mmol/L    CHLORIDE 105 98 - 111 mmol/L    CO2 TOTAL 26 21 - 35 mmol/L    ANION GAP 6 mmol/L    CALCIUM 9.1 8.8 - 10.3 mg/dL    GLUCOSE 149 (H) 70  - 110 mg/dL    BUN 18 10 - 25 mg/dL    CREATININE 1.01 <=1.30 mg/dL    BUN/CREA RATIO 18     ESTIMATED GFR >60 Avg: 93 mL/min/1.84m^2   MAGNESIUM   Result Value Ref Range    MAGNESIUM 1.9 1.8 - 2.3 mg/dL   PHOSPHORUS   Result Value Ref Range    PHOSPHORUS 2.7 2.5 - 4.5 mg/dL   CBC WITH DIFF   Result Value Ref Range    WBC 6.8 3.3 - 9.3 x10^3/uL    RBC 5.33 4.40 - 5.68 x10^6/uL    HGB 15.8 13.4 - 17.3 g/dL    HCT 46.9 38.9 - 50.5 %    MCV 88.0 82.4 - 95.0 fL    MCH 29.6 27.9 - 33.1 pg    MCHC 33.6 32.8 - 36.0 g/dL    RDW 13.4 10.9 - 15.1 %    PLATELETS 205 140 - 440 x10^3/uL    MPV 9.0 6.0 - 10.2 fL    NEUTROPHIL % 63 %    LYMPHOCYTE % 26 %    MONOCYTE % 9 %    EOSINOPHIL % 1 %    BASOPHIL % 0 %    NEUTROPHIL # 4.30 1.60 - 5.50 x10^3/uL    LYMPHOCYTE # 1.80 0.80 - 3.20 x10^3/uL    MONOCYTE # 0.60 0.20 - 0.80 x10^3/uL    EOSINOPHIL # 0.10 0.00 - 0.50 x10^3/uL    BASOPHIL # 0.00 0.00 - 0.20 x10^3/uL   PTT (PARTIAL THROMBOPLASTIN TIME)   Result Value Ref Range    APTT 95.0 (H) 25.0 - 37.0 seconds         Veatrice Kells, MD      This note may have been partially generated using MModal Fluency Direct system, and there may be some incorrect words, spellings, and punctuation that were not noted in checking the note before saving.

## 2019-04-21 NOTE — Care Plan (Signed)
Pt is A&O. Running NSR on RA. Ambulates in room independently. No complaints of chest pain or shortness of breath. Pt slept throughout the night with no complaints or acute overnight events. This patient currently meets requirements for low to mid level nursing care. The patient will continue to be monitored and re-evaluated for any changes in condition.

## 2019-04-22 ENCOUNTER — Encounter (HOSPITAL_COMMUNITY): Payer: Self-pay | Admitting: Hospitalist

## 2019-04-22 ENCOUNTER — Inpatient Hospital Stay (HOSPITAL_COMMUNITY): Payer: MEDICAID

## 2019-04-22 ENCOUNTER — Inpatient Hospital Stay (HOSPITAL_COMMUNITY): Payer: MEDICAID | Admitting: Nuclear Medicine

## 2019-04-22 LAB — CBC WITH DIFF
BASOPHIL #: 0.1 10*3/uL (ref 0.00–0.20)
BASOPHIL %: 1 %
EOSINOPHIL #: 0.1 10*3/uL (ref 0.00–0.50)
EOSINOPHIL %: 2 %
HCT: 47.3 % (ref 38.9–50.5)
HGB: 16.7 g/dL (ref 13.4–17.3)
LYMPHOCYTE #: 1.9 10*3/uL (ref 0.80–3.20)
LYMPHOCYTE %: 22 %
MCH: 30.9 pg (ref 27.9–33.1)
MCHC: 35.3 g/dL (ref 32.8–36.0)
MCV: 87.6 fL (ref 82.4–95.0)
MONOCYTE #: 1 10*3/uL — ABNORMAL HIGH (ref 0.20–0.80)
MONOCYTE %: 12 %
MPV: 8.9 fL (ref 6.0–10.2)
NEUTROPHIL #: 5.6 10*3/uL — ABNORMAL HIGH (ref 1.60–5.50)
NEUTROPHIL %: 64 %
PLATELETS: 213 10*3/uL (ref 140–440)
RBC: 5.4 10*6/uL (ref 4.40–5.68)
RDW: 13 % (ref 10.9–15.1)
WBC: 8.8 10*3/uL (ref 3.3–9.3)

## 2019-04-22 LAB — BASIC METABOLIC PANEL
ANION GAP: 6 mmol/L
BUN/CREA RATIO: 15
BUN: 14 mg/dL (ref 10–25)
CALCIUM: 9.2 mg/dL (ref 8.8–10.3)
CHLORIDE: 104 mmol/L (ref 98–111)
CO2 TOTAL: 28 mmol/L (ref 21–35)
CREATININE: 0.96 mg/dL (ref <=1.30)
ESTIMATED GFR: >60 mL/min/{1.73_m2}
GLUCOSE: 88 mg/dL (ref 70–110)
POTASSIUM: 4.6 mmol/L (ref 3.5–5.0)
SODIUM: 138 mmol/L (ref 135–145)

## 2019-04-22 LAB — PHOSPHORUS: PHOSPHORUS: 3 mg/dL (ref 2.5–4.5)

## 2019-04-22 LAB — PTT (PARTIAL THROMBOPLASTIN TIME): APTT: 85.1 s — ABNORMAL HIGH (ref 25.0–37.0)

## 2019-04-22 LAB — MAGNESIUM: MAGNESIUM: 1.9 mg/dL (ref 1.8–2.3)

## 2019-04-22 LAB — MYOCARDIAL PERFUSION COMPLETE
Nuc Stress EF: 53 % — NL
ST ELEVATION - BASELINE: 1 mm — NL

## 2019-04-22 MED ORDER — FENTANYL (PF) 50 MCG/ML INJECTION SOLUTION
Freq: Once | INTRAMUSCULAR | Status: AC | PRN
Start: 2019-04-22 — End: 2019-04-22
  Administered 2019-04-22 (×4): 25 ug via INTRAVENOUS

## 2019-04-22 MED ORDER — MIDAZOLAM 1 MG/ML INJECTION SOLUTION
Freq: Once | INTRAMUSCULAR | Status: AC | PRN
Start: 2019-04-22 — End: 2019-04-22
  Administered 2019-04-22 (×5): 1 mg via INTRAVENOUS

## 2019-04-22 MED ORDER — BUTAMBEN-TETRACAINE-BENZOCAINE 2 %-2 %-14 % (200 MG/SEC) TOPICAL SPRAY
INHALATION_SPRAY | Freq: Once | CUTANEOUS | Status: AC | PRN
Start: 2019-04-22 — End: 2019-04-22
  Administered 2019-04-22: 1 via TOPICAL

## 2019-04-22 MED ORDER — SODIUM CHLORIDE 0.9 % IV BOLUS
INJECTION | Status: AC | PRN
Start: 2019-04-22 — End: 2019-04-22
  Administered 2019-04-22: 250 mL via INTRAVENOUS

## 2019-04-22 MED ORDER — REGADENOSON 0.4 MG/5 ML INTRAVENOUS SYRINGE
0.40 mg | INJECTION | INTRAVENOUS | Status: AC
Start: 2019-04-22 — End: 2019-04-22
  Administered 2019-04-22: 0.4 mg via INTRAVENOUS

## 2019-04-22 MED ORDER — LIDOCAINE HCL 2 % MUCOSAL SOLUTION
Freq: Once | Status: AC | PRN
Start: 2019-04-22 — End: 2019-04-22
  Administered 2019-04-22: 10 mL

## 2019-04-22 NOTE — Care Plan (Signed)
EOSN  Pt resting in bed. No complaints of pain. Pt remains on RA. Running NSR on the monitor. Pt NPO for TEE and MPS today. Heparin running @ 14 u/kg/hr. Will continue to monitor. Bayard Hugger, RN  Problem: Adult Inpatient Plan of Care  Goal: Plan of Care Review  Outcome: Ongoing (see interventions/notes)  Goal: Patient-Specific Goal (Individualized)  Outcome: Ongoing (see interventions/notes)  Goal: Absence of Hospital-Acquired Illness or Injury  Outcome: Ongoing (see interventions/notes)  Goal: Optimal Comfort and Wellbeing  Outcome: Ongoing (see interventions/notes)  Goal: Rounds/Family Conference  Outcome: Ongoing (see interventions/notes)     Problem: Adjustment to Illness (Acute Coronary Syndrome)  Goal: Optimal Adaptation to Illness  Outcome: Ongoing (see interventions/notes)     Problem: Arrhythmia/Dysrhythmia (Acute Coronary Syndrome)  Goal: Normalized Cardiac Rhythm  Outcome: Ongoing (see interventions/notes)     Problem: Cardiac-Related Pain (Acute Coronary Syndrome)  Goal: Absence of Cardiac-Related Pain  Outcome: Ongoing (see interventions/notes)     Problem: Hemodynamic Instability (Acute Coronary Syndrome)  Goal: Effective Cardiac Pump Function  Outcome: Ongoing (see interventions/notes)     Problem: Tissue Perfusion (Acute Coronary Syndrome)  Goal: Adequate Tissue Perfusion  Outcome: Ongoing (see interventions/notes)     Problem: Anticoagulant Use  Goal: Identify signs/symptoms (anticoagulant use)  Outcome: Ongoing (see interventions/notes)  Goal: Reduce risks associated with anticoagulant use  Description: INTERVENTIONS:  1. Observe daily and report any signs and symptoms to MD  2. Blood tests as ordered  3. Avoid using medications containing ASA  4. Brush teeth with soft toothbrush daily  5. Notify Registered Dietician of anticoagulant use  Outcome: Ongoing (see interventions/notes)

## 2019-04-22 NOTE — Progress Notes (Signed)
[facility]  Cardiology   Progress Note      [name] [name], [name] [name]  Date of Admission:  04/18/2019  Date of service: 04/22/2019  Date of Birth:    MRN:  [id number]    Hospital Day:  LOS: 2 days     Assessment/ Plan:   1. Chest pain stable MPS was negative for ischemia  2. Abnormal echocardiogram TEE showed no significant mass or thrombus  3. Coronary artery disease status post bypass surgery stable   4. Hyperlipidemia patient Lipitor 5. Obesity   The patient otherwise cardiac-wise stable at this time the patient can be discharged   The patient to follow up with the his cardiologist at [facility] couple weeks and see me as needed  Active Hospital Problems    Diagnosis   . Primary Problem: Chest pain      subjective   Patient this time not having chest pain  The patient is feeling better patient denied any nausea or vomiting denied any fever or chills        Current Medications:  acetaminophen (TYLENOL) tablet, 650 mg, Oral, Q6H PRN  aspirin chewable tablet 81 mg, 81 mg, Oral, QPM  atorvastatin (LIPITOR) tablet, 80 mg, Oral, QPM  clopidogrel (PLAVIX) 75 mg tablet, 75 mg, Oral, QPM  famotidine (PEPCID) tablet, 10 mg, Oral, 2x/day  heparin 25,000 units in D5W 250 mL infusion, 18 Units/kg/hr (Adjusted), Intravenous, Continuous  ipratropium-albuterol 0.5 mg-3 mg(2.5 mg base)/3 mL Solution for Nebulization, 3 mL, Nebulization, 4x/day  melatonin tablet, 6 mg, Oral, HS PRN  metoprolol succinate (TOPROL-XL) 24 hr extended release tablet, 100 mg, Oral, NIGHTLY  montelukast (SINGULAIR) 10 mg tablet, 10 mg, Oral, QPM  nitroGLYCERIN (NITROSTAT) sublingual tablet, 0.4 mg, Sublingual, Q5 Min PRN  NS 10 mL injection, 10 mL, Intravenous, Give in Radiology  NS flush syringe, 3 mL, Intracatheter, Q8HRS  NS flush syringe, 3 mL, Intracatheter, Q1H PRN  ondansetron (ZOFRAN) 2 mg/mL injection, 4 mg, Intravenous, Q6H PRN  pantoprazole (PROTONIX) delayed release tablet, 20 mg, Oral, QPM        Allergies   Allergen  Reactions   . Phenergan [Promethazine] Seizure             Review system  Otherwise negative    Vital Signs:  All vital signs reviewed    Physical exam  Patient alert oriented x3 not in acute distress  HEENT unremarkable  Neck no JVD or bruit carotid upstroke within normal limit neck is supple  Lungs clear no rales rhonchi or wheezing  Heart Regular rhythm no gallop, rub or murmur     Abdomen soft nontender no masses or organomegaly, bowel sounds positive, no masses.  Extremities no cyanosis, clubbing or edema  Skin no rash.  Neuro grossly within normal limit no focal finding      Temp (24hrs) Max:36.4 C (97.5 F)      Temperature: 36.4 C (97.5 F) (04/22/19 1500)  BP (Non-Invasive): 124/84 (04/22/19 1500)  MAP (Non-Invasive): 83 mmHG (04/22/19 1154)  Heart Rate: 76 (04/22/19 1500)  Respiratory Rate: 16 (04/22/19 1500)  Pain Score (Numeric, Faces): 0 (04/22/19 1500)  SpO2: 100 % (04/22/19 1500)      I/O last 24 hours:      Intake/Output Summary (Last 24 hours) at 04/22/2019 1750  Last data filed at 04/22/2019 1702  Gross per 24 hour   Intake 1221 ml   Output --   Net 1221 ml     I/O current shift:  11/02  0700 - 11/02 1859  In: 135 [I.V.:135]  Out: -   No results for input(s): GLUIP in the last 48 hours.            Labs:  All Lab Results reviewed by myself   for Last 24 Hours:    Results for orders placed or performed during the hospital encounter of 04/18/19 (from the past 24 hour(s))   PTT (PARTIAL THROMBOPLASTIN TIME)   Result Value Ref Range    APTT 85.1 (H) 25.0 - 37.0 seconds   BASIC METABOLIC PANEL   Result Value Ref Range    SODIUM 138 135 - 145 mmol/L    POTASSIUM 4.6 3.5 - 5.0 mmol/L    CHLORIDE 104 98 - 111 mmol/L    CO2 TOTAL 28 21 - 35 mmol/L    ANION GAP 6 mmol/L    CALCIUM 9.2 8.8 - 10.3 mg/dL    GLUCOSE 88 70 - 110 mg/dL    BUN 14 10 - 25 mg/dL    CREATININE 0.96 <=1.30 mg/dL    BUN/CREA RATIO 15     ESTIMATED GFR >60 Avg: 93 mL/min/1.38m^2   MAGNESIUM   Result Value Ref Range    MAGNESIUM 1.9 1.8 -  2.3 mg/dL   PHOSPHORUS   Result Value Ref Range    PHOSPHORUS 3.0 2.5 - 4.5 mg/dL   CBC WITH DIFF   Result Value Ref Range    WBC 8.8 3.3 - 9.3 x10^3/uL    RBC 5.40 4.40 - 5.68 x10^6/uL    HGB 16.7 13.4 - 17.3 g/dL    HCT 47.3 38.9 - 50.5 %    MCV 87.6 82.4 - 95.0 fL    MCH 30.9 27.9 - 33.1 pg    MCHC 35.3 32.8 - 36.0 g/dL    RDW 13.0 10.9 - 15.1 %    PLATELETS 213 140 - 440 x10^3/uL    MPV 8.9 6.0 - 10.2 fL    NEUTROPHIL % 64 %    LYMPHOCYTE % 22 %    MONOCYTE % 12 %    EOSINOPHIL % 2 %    BASOPHIL % 1 %    NEUTROPHIL # 5.60 (H) 1.60 - 5.50 x10^3/uL    LYMPHOCYTE # 1.90 0.80 - 3.20 x10^3/uL    MONOCYTE # 1.00 (H) 0.20 - 0.80 x10^3/uL    EOSINOPHIL # 0.10 0.00 - 0.50 x10^3/uL    BASOPHIL # 0.10 0.00 - 0.20 x10^3/uL   MYOCARDIAL PERFUSION COMPLETE   Result Value Ref Range    ST ELEVATION - BASELINE 1.0 mm    Nuc Stress EF 53 %         Veatrice Kells, MD      This note may have been partially generated using MModal Fluency Direct system, and there may be some incorrect words, spellings, and punctuation that were not noted in checking the note before saving.

## 2019-04-22 NOTE — Discharge Summary (Signed)
DISCHARGE SUMMARY      PATIENT NAME:  [name] [name], [name] [name]  MRN:  [id number]  DOB:      ADMISSION DATE:  04/18/2019  DISCHARGE DATE:  04/22/2019    ATTENDING PHYSICIAN:Mace, Ronna Polio, MD  SERVICE: [facility] HOSPITALIST 7  PRIMARY CARE PHYSICIAN: Biagio Quint, PA-C     Reason for Admission     Diagnosis    Chest pain [125822]          DISCHARGE DIAGNOSIS:     Principal Problem:  Chest pain    Active Hospital Problems    Diagnosis Date Noted   . Principle Problem: Chest pain [R07.9] 04/22/2016      Resolved Hospital Problems   No resolved problems to display.     Active Non-Hospital Problems    Diagnosis Date Noted   . 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 Hypokalemia 09/07/2016   . Unstable angina (CMS HCC) 09/06/2016   . CAD (coronary artery disease) 01/25/2013      Allergies   Allergen Reactions   . Phenergan [Promethazine] Seizure        INVESTIGATIONS:  Labs:  CBC  (Last 24 hours)    Date/Time WBC HGB HCT MCV PLATELETS    04/22/19 0525 8.8    16.7    47.3    87.6    213           BMP  (Last 24 hours)    Date/Time Na K Cl CO2 BUN CREAT Calcium Glucose    04/22/19 0525 138    4.6    104    28    14    0.96    9.2    88           Recent Labs     04/22/19  0525   MAGNESIUM 1.9   PHOSPHORUS 3.0     Recent Labs     04/22/19  0525   APTT 85.1*     TROPONIN I   Date Value Ref Range Status   04/19/2019 0.00 <=0.04 ng/mL Final   04/18/2019 0.00 <=0.04 ng/mL Final   04/18/2019 0.00 <=0.04 ng/mL Final         Imaging:         DISCHARGE MEDICATIONS:     Current Discharge Medication List      CONTINUE these medications - NO CHANGES were made during your visit.      Details   aspirin 81 mg Tablet, Chewable   81 mg, Oral, DAILY  Qty:  60 Tab  Refills:  2     atorvastatin 40 mg Tablet  Commonly known as:  LIPITOR   80 mg, Oral, EVERY EVENING  Qty:  180 Tab  Refills:  0     Breo Ellipta 200-25 mcg/dose Disk with Device  Generic drug:  fluticasone furoate-vilanteroL   1 INHALATION,  Inhalation, DAILY  Qty:  3 Each  Refills:  4     clopidogreL 75 mg Tablet  Commonly known as:  PLAVIX   Take 1 tablet by mouth once daily  Qty:  90 Tab  Refills:  0     famotidine 10 mg Tablet  Commonly known as:  PEPCID   10 mg, Oral, 2 TIMES DAILY  Refills:  0     ipratropium-albuteroL 0.5 mg-3 mg(2.5 mg base)/3 mL Solution for Nebulization  Commonly known as:  DUONEB   3 mL, Nebulization, 4 TIMES DAILY  Refills:  0     metoprolol succinate 100 mg Tablet Sustained Release 24 hr  Commonly known as:  TOPROL-XL   100 mg, Oral, DAILY  Refills:  0     montelukast 10 mg Tablet  Commonly known as:  SINGULAIR   10 mg, Oral, EVERY EVENING  Refills:  0     nitroGLYCERIN 0.4 mg Tablet, Sublingual  Commonly known as:  NITROSTAT   0.4 mg, EVERY 5 MIN PRN  Refills:  0     pantoprazole 20 mg Tablet, Delayed Release (E.C.)  Commonly known as:  PROTONIX   20 mg, Oral, EVERY MORNING BEFORE BREAKFAST  Refills:  0     TYLENOL PM ORAL   2 Caps, Oral, NIGHTLY  Refills:  0     Ventolin HFA 90 mcg/actuation HFA Aerosol Inhaler  Generic drug:  albuterol sulfate   No dose, route, or frequency recorded.  Refills:  0     Vitamin D 1,250 mcg (50,000 unit) Capsule  Generic drug:  ergocalciferol (vitamin D2)   50,000 Units, Oral, EVERY 7 DAYS  Refills:  0          Discharge med list refreshed?  YES        DISCHARGE INSTRUCTIONS:  Follow-up Information     Randol Kern, PA-C. Schedule an appointment as soon as possible for a visit in 1 week.    Specialty:  EXTERNAL  Contact information:  [facility]  [address] [place] [address]  [phone]             Go to [facility] - Emergency Department.    Specialty:  Emergency Medicine  Why:  If symptoms worsen  Contact information:  [address]  [place] [address]  [phone]  Additional information:  Traveling south on [address], take [address], turn right onto [address] then turn left onto [address] at the stop light. Visitor  parking lot is adjacent to the front of the hospital.   Traveling north on [address], take [address], turn left onto [address] then turn left onto [address] at the second stop light. Visitor parking lot is adjacent to the front of the hospital.                  DISCHARGE INSTRUCTION - CARDIAC DIET     Diet: CARDIAC DIET      DISCHARGE INSTRUCTION - ACTIVITY     Activity: AS TOLERATED      DISCHARGE INSTRUCTION - MISC    Follow-up with your cardiologist in 1 week          REASON FOR HOSPITALIZATION AND HOSPITAL COURSE:  This is a 53 y.o., male with history of CAD status post CABG, GERD, hyperlipidemia, hypertension and asthma.  Presented to emergency department with sudden onset of diffuse diaphoresis, nausea, lightheadedness and chest pressure that was relieved with nitroglycerin.  Workup in emergency department essentially negative but patient was admitted to rule out ACS.  Troponins trended and negative and repeat echocardiogram was abnormal-echo density in right ventricle cannot rule out vegetation versus thrombus.  Patient was started on heparin drip with plan for TEE.  Underwent TEE that showed normal ejection fraction and calcified chordae tendae-no thrombus or vegetation noted.  Above likely reason for abnormal TTE.  Repeat MPS today also negative for ischemia.  Patient has remained stable will be discharged home with instruction to follow-up with his cardiologist in 1 week and  PCP in 1 week.      PHYSICAL EXAM: Nursing note and vitals reviewed.   Constitutional: Patient is in no acute distress.  Obese   Lungs: Clear to auscultation bilaterally without wheezes, rales or rhonchi.  Cardiovascular: Regular rate and rhythm. No murmur, rub, or gallop  Abdomen: Normal bowel sounds. Soft, nontender, nondistended. No rebound, Guarding, or masses noted.   MSK/Extremities: Moves all extremities  Skin: No lesions noted.  No rashes.  Neuro: CNs 2-12 grossly intact. No facial droop noted.    Psych: Alert andoriented to  person, place, and time. Normal affect      CONDITION ON DISCHARGE: Improved and stable.      DISCHARGE DISPOSITION:  Home discharge      Discharge time:  30 minutes        Earl GalaKimberly Mamta Rimmer, FNP-BC  04/22/2019, 17:37    Copies sent to Care Team       Relationship Specialty Notifications Start End    Randol KernLeach, Carrie, [place] PCP - General EXTERNAL  04/26/18     Phone: [phone] Fax: [phone]         [facility] [address] [place] [phone]          Referring providers can utilize [email or web address] access their referred [facility] patient's information.

## 2019-04-22 NOTE — Nurses Notes (Signed)
Post procedure education and instructions given. All questions answered. Npo for 2 hours

## 2019-04-22 NOTE — Nurses Notes (Signed)
Discussed with the patient and all questions fully answered. He will call me if any problems arise.  Philomina Leon Marie Kevron Patella, RN

## 2019-04-22 NOTE — Nurses Notes (Signed)
Patient arrived, connected to monitors, procedure explained and all questions answered.

## 2019-04-30 LAB — ENDOCARDITIS BLOOD CULTURE (2 BOTTLES)
BLOOD CULTURE, ENDOCARDITIS: NO GROWTH
BLOOD CULTURE, ENDOCARDITIS: NO GROWTH
BLOOD CULTURE, ENDOCARDITIS: NO GROWTH

## 2019-05-02 ENCOUNTER — Ambulatory Visit: Payer: MEDICAID | Attending: Internal Medicine | Admitting: Internal Medicine

## 2019-05-02 ENCOUNTER — Other Ambulatory Visit: Payer: Self-pay

## 2019-05-02 ENCOUNTER — Encounter (HOSPITAL_COMMUNITY): Payer: Self-pay | Admitting: Internal Medicine

## 2019-05-02 VITALS — BP 123/87 | HR 64 | Ht 75.0 in | Wt 268.3 lb

## 2019-05-02 DIAGNOSIS — R42 Dizziness and giddiness: Secondary | ICD-10-CM

## 2019-05-02 DIAGNOSIS — E785 Hyperlipidemia, unspecified: Secondary | ICD-10-CM | POA: Insufficient documentation

## 2019-05-02 DIAGNOSIS — Z79899 Other long term (current) drug therapy: Secondary | ICD-10-CM | POA: Insufficient documentation

## 2019-05-02 DIAGNOSIS — R5383 Other fatigue: Secondary | ICD-10-CM

## 2019-05-02 DIAGNOSIS — I2510 Atherosclerotic heart disease of native coronary artery without angina pectoris: Secondary | ICD-10-CM | POA: Insufficient documentation

## 2019-05-02 NOTE — Progress Notes (Signed)
PATIENT NAME: [name] [name] NUMBER:  [id number]  DATE OF SERVICE: 05/02/2019  DATE OF BIRTH:      PROGRESS NOTE    PRIMARY CARE PROVIDER:  Biagio Quint, PA-C.    SUBJECTIVE:  [name] is a very pleasant 53 year old man with a history of coronary artery disease, single vessel, with involvement of the left anterior descending artery.  He had multiple PCIs in the past.  He finally underwent LIMA to the LAD in approximately March 2018.  He was re-catheterized in November 2019 with patent LIMA to the LAD.  No significant coronary artery disease elsewhere.  In the most recent past, he was admitted to the [facility] a week or so ago with symptoms of diaphoresis and overall fatigue.  His troponins were negative.  He underwent echocardiogram that showed a preserved ejection fraction.  There was calcification in the ventricle; therefore, they decided to do a transesophageal echocardiogram, which showed a preserved ejection fraction of 60% and calcified chordae tendinae and a prominent moderator band, mild MR and TR, basically an essentially negative transesophageal echocardiogram.  He had a CT scan of the chest that was negative for PE.  No acute abnormalities.  He also had a nuclear stress test, which was negative for ischemia.  His labs were all normal.  Today, he states that he has fatigue and some dizziness.  I am concerned this may be beta-blocker related.  Essentially all of [facility] was [facility].    MEDICATIONS:  Cardiac medications:  1. Aspirin 81 mg daily.  2. Lipitor 80 mg daily.  3. Plavix 75 mg daily.  4. Toprol-XL 100 mg daily.     Discontinued medications today:  1. Plavix 75 mg daily.  2. Toprol-XL 100 mg daily.    OBJECTIVE:  Blood pressure is 123/82, pulse 64, temperature 36.6 degrees Celsius.  Height is 6 feet 3 inches.  Weight is 121.7 kg, oxygen saturation 97% on room air.  General:  No acute distress.  Very pleasant man.  Head and neck  exam:  No jugular venous distention.  Cardiac:  Exam is regular rate and rhythm.  Normal S1, S2.  No significant murmurs.  Lungs:  Clear to auscultation bilaterally.  No wheezes, rales, or rhonchi.  Abdomen:  Soft, nontender, normal bowel sounds.  Extremities reveal no edema of  bilateral lower extremities.    DIAGNOSTIC STUDIES:  April 18, 2019:  Total cholesterol 134, HDL 39, LDL 80, triglycerides 127.  Troponins were 0.  BNP was 38.      Transesophageal echocardiogram from November 2020:  Ejection fraction 60%, calcified chordae, prominent moderator band, mild mitral valve regurgitation.      CT of the chest:  Negative for pulmonary embolism.      Myocardial perfusion scan:  Negative for ischemia.      Hemoglobin 16.7, platelet count 213,000, creatinine 0.96.      Cardiac catheterization, November 2019:  Patent LIMA to the LAD.    ASSESSMENT:  1. Coronary artery disease, status post LIMA to the LAD with history of multiple PCIs to the LAD in the past with normal coronary arteries elsewhere.  2. Dyslipidemia.  3. Fatigue and dizziness.    RECOMMENDATIONS:  1. The patient is approximately 3 years out from revascularization.  He has stable coronary artery disease with a patent LIMA to the LAD demonstrated on repeat catheterization post surgery.  Therefore, I am discontinuing his Toprol.  I feel that he  will get relief being off Toprol.  He is slightly bradycardic today.  2. He is approximately 3 years out from his surgery.  Stop the Plavix.  3. Continue statin therapy, diet, and exercise modification.  I would like his LDL less than 70.  He is going to work on diet.     In summary, stop Toprol-XL 100 mg daily and stop Plavix 75 mg daily.  All above testing at  [facility] was essentially negative.        Jodi Geralds, MD  Assistant Professor   [place] Department of Medicine            CC:   Randol Kern, PA-C   Fax: [phone]       DD:  05/02/2019 11:37:15  DT:  05/02/2019 14:12:59 TM  D#:   [id number]

## 2019-05-02 NOTE — Progress Notes (Signed)
See dictated note.

## 2019-06-11 ENCOUNTER — Other Ambulatory Visit: Payer: Self-pay

## 2019-06-11 ENCOUNTER — Inpatient Hospital Stay
Admission: AD | Admit: 2019-06-11 | Discharge: 2019-06-12 | DRG: 177 | Disposition: A | Discharge status: Home / Self Care | Payer: MEDICAID | Source: Other Acute Inpatient Hospital | Attending: INTERNAL MEDICINE | Admitting: INTERNAL MEDICINE

## 2019-06-11 ENCOUNTER — Inpatient Hospital Stay (HOSPITAL_COMMUNITY): Payer: MEDICAID | Admitting: NEPHROLOGY

## 2019-06-11 DIAGNOSIS — G4733 Obstructive sleep apnea (adult) (pediatric): Secondary | ICD-10-CM | POA: Diagnosis present

## 2019-06-11 DIAGNOSIS — Z9861 Coronary angioplasty status: Secondary | ICD-10-CM

## 2019-06-11 DIAGNOSIS — K219 Gastro-esophageal reflux disease without esophagitis: Secondary | ICD-10-CM | POA: Diagnosis present

## 2019-06-11 DIAGNOSIS — I10 Essential (primary) hypertension: Secondary | ICD-10-CM | POA: Diagnosis present

## 2019-06-11 DIAGNOSIS — Z9989 Dependence on other enabling machines and devices: Secondary | ICD-10-CM

## 2019-06-11 DIAGNOSIS — G4730 Sleep apnea, unspecified: Secondary | ICD-10-CM | POA: Diagnosis present

## 2019-06-11 DIAGNOSIS — Z7982 Long term (current) use of aspirin: Secondary | ICD-10-CM

## 2019-06-11 DIAGNOSIS — I252 Old myocardial infarction: Secondary | ICD-10-CM

## 2019-06-11 DIAGNOSIS — E785 Hyperlipidemia, unspecified: Secondary | ICD-10-CM | POA: Diagnosis present

## 2019-06-11 DIAGNOSIS — J9601 Acute respiratory failure with hypoxia: Secondary | ICD-10-CM | POA: Diagnosis present

## 2019-06-11 DIAGNOSIS — Z7951 Long term (current) use of inhaled steroids: Secondary | ICD-10-CM

## 2019-06-11 DIAGNOSIS — Z87891 Personal history of nicotine dependence: Secondary | ICD-10-CM

## 2019-06-11 DIAGNOSIS — I2510 Atherosclerotic heart disease of native coronary artery without angina pectoris: Secondary | ICD-10-CM

## 2019-06-11 DIAGNOSIS — Z951 Presence of aortocoronary bypass graft: Secondary | ICD-10-CM

## 2019-06-11 DIAGNOSIS — I25110 Atherosclerotic heart disease of native coronary artery with unstable angina pectoris: Secondary | ICD-10-CM | POA: Diagnosis present

## 2019-06-11 DIAGNOSIS — Z7902 Long term (current) use of antithrombotics/antiplatelets: Secondary | ICD-10-CM

## 2019-06-11 DIAGNOSIS — J1289 Other viral pneumonia: Secondary | ICD-10-CM | POA: Diagnosis present

## 2019-06-11 DIAGNOSIS — Z79899 Other long term (current) drug therapy: Secondary | ICD-10-CM

## 2019-06-11 DIAGNOSIS — J45909 Unspecified asthma, uncomplicated: Secondary | ICD-10-CM | POA: Diagnosis present

## 2019-06-11 DIAGNOSIS — U071 COVID-19: Principal | ICD-10-CM | POA: Diagnosis present

## 2019-06-11 LAB — HEPATIC FUNCTION PANEL
ALBUMIN: 4.1 g/dL (ref 3.5–5.0)
ALKALINE PHOSPHATASE: 95 U/L (ref 45–115)
ALT (SGPT): 31 U/L (ref <55)
AST (SGOT): 28 U/L (ref 8–48)
BILIRUBIN DIRECT: 0.2 mg/dL (ref <0.3)
BILIRUBIN TOTAL: 0.5 mg/dL (ref 0.3–1.3)
PROTEIN TOTAL: 7.8 g/dL (ref 6.4–8.3)

## 2019-06-11 LAB — CBC WITH DIFF
BASOPHIL #: <0.1 10*3/uL (ref <=0.20)
BASOPHIL %: 0 %
EOSINOPHIL #: <0.1 10*3/uL (ref <=0.50)
EOSINOPHIL %: 0 %
HCT: 50.7 % (ref 38.9–52.0)
HGB: 17.7 g/dL — ABNORMAL HIGH (ref 13.4–17.5)
IMMATURE GRANULOCYTE #: <0.1 10*3/uL (ref <0.10)
IMMATURE GRANULOCYTE %: 1 % (ref 0–1)
LYMPHOCYTE #: 0.47 10*3/uL — ABNORMAL LOW (ref 1.00–4.80)
LYMPHOCYTE %: 7 %
MCH: 29.7 pg (ref 26.0–32.0)
MCHC: 34.9 g/dL (ref 31.0–35.5)
MCV: 85.2 fL (ref 78.0–100.0)
MONOCYTE #: <0.1 10*3/uL — ABNORMAL LOW (ref 0.20–1.10)
MONOCYTE %: 1 %
MPV: 10.8 fL (ref 8.7–12.5)
NEUTROPHIL #: 5.81 10*3/uL (ref 1.50–7.70)
NEUTROPHIL %: 91 %
PLATELETS: 218 10*3/uL (ref 150–400)
RBC: 5.95 10*6/uL (ref 4.50–6.10)
RDW-CV: 12 % (ref 11.5–15.5)
WBC: 6.4 10*3/uL (ref 3.7–11.0)

## 2019-06-11 LAB — FERRITIN: FERRITIN: 178 ng/mL (ref 20–300)

## 2019-06-11 LAB — HIV1/HIV2 SCREEN, COMBINED ANTIGEN AND ANTIBODY: HIV SCREEN, COMBINED ANTIGEN & ANTIBODY: NEGATIVE

## 2019-06-11 LAB — C-REACTIVE PROTEIN(CRP),INFLAMMATION: CRP INFLAMMATION: <1 mg/L (ref <=8.0)

## 2019-06-11 LAB — BASIC METABOLIC PANEL
ANION GAP: 14 mmol/L — ABNORMAL HIGH (ref 4–13)
BUN/CREA RATIO: 16 (ref 6–22)
BUN: 17 mg/dL (ref 8–25)
CALCIUM: 9.1 mg/dL (ref 8.5–10.2)
CHLORIDE: 102 mmol/L (ref 96–111)
CO2 TOTAL: 21 mmol/L — ABNORMAL LOW (ref 22–32)
CREATININE: 1.09 mg/dL (ref 0.62–1.27)
ESTIMATED GFR: >60 mL/min/{1.73_m2} (ref >60)
GLUCOSE: 199 mg/dL — ABNORMAL HIGH (ref 65–139)
POTASSIUM: 4.2 mmol/L (ref 3.5–5.1)
SODIUM: 137 mmol/L (ref 136–145)

## 2019-06-11 LAB — PT/INR
INR: 0.97 (ref 0.80–1.20)
PROTHROMBIN TIME: 11.2 s (ref 9.1–13.9)

## 2019-06-11 LAB — SEDIMENTATION RATE: ERYTHROCYTE SEDIMENTATION RATE (ESR): 5 mm/h (ref 0–15)

## 2019-06-11 LAB — D-DIMER: D-DIMER: <200 ng/mL (ref <=232)

## 2019-06-11 LAB — HEPATITIS C ANTIBODY SCREEN WITH REFLEX TO HCV PCR: HCV ANTIBODY QUALITATIVE: NEGATIVE

## 2019-06-11 LAB — FIBRINOGEN: FIBRINOGEN: 352 mg/dL (ref 200–400)

## 2019-06-11 LAB — POC BLOOD GLUCOSE (RESULTS): GLUCOSE, POC: 183 mg/dL — ABNORMAL HIGH (ref 70–105)

## 2019-06-11 LAB — MAGNESIUM: MAGNESIUM: 1.8 mg/dL (ref 1.6–2.6)

## 2019-06-11 LAB — PTT (PARTIAL THROMBOPLASTIN TIME): APTT: 31.3 s (ref 24.2–37.5)

## 2019-06-11 LAB — PHOSPHORUS: PHOSPHORUS: 2.8 mg/dL (ref 2.4–4.7)

## 2019-06-11 LAB — LDH: LDH: 371 U/L — ABNORMAL HIGH (ref 125–220)

## 2019-06-11 LAB — THYROID STIMULATING HORMONE WITH FREE T4 REFLEX: TSH: 0.547 u[IU]/mL (ref 0.350–5.000)

## 2019-06-11 MED ORDER — IPRATROPIUM 20 MCG-ALBUTEROL 100 MCG/ACTUATION MIST FOR INHALATION
1.0000 | Freq: Four times a day (QID) | RESPIRATORY_TRACT | Status: DC
Start: 2019-06-11 — End: 2019-06-12
  Administered 2019-06-11 – 2019-06-12 (×2): 1 via RESPIRATORY_TRACT
  Filled 2019-06-11: qty 12

## 2019-06-11 MED ORDER — METOPROLOL SUCCINATE ER 100 MG TABLET,EXTENDED RELEASE 24 HR
100.0000 mg | ORAL_TABLET | Freq: Every day | ORAL | Status: DC
Start: 2019-06-12 — End: 2019-06-11

## 2019-06-11 MED ORDER — MONTELUKAST 10 MG TABLET
10.00 mg | ORAL_TABLET | Freq: Every evening | ORAL | Status: DC
Start: 2019-06-11 — End: 2019-06-12
  Administered 2019-06-12: 10 mg via ORAL
  Filled 2019-06-11 (×2): qty 1

## 2019-06-11 MED ORDER — ONDANSETRON HCL (PF) 4 MG/2 ML INJECTION SOLUTION
4.0000 mg | Freq: Four times a day (QID) | INTRAMUSCULAR | Status: DC | PRN
Start: 2019-06-11 — End: 2019-06-12

## 2019-06-11 MED ORDER — SENNOSIDES 8.6 MG-DOCUSATE SODIUM 50 MG TABLET
1.0000 | ORAL_TABLET | Freq: Two times a day (BID) | ORAL | Status: DC | PRN
Start: 2019-06-11 — End: 2019-06-12

## 2019-06-11 MED ORDER — BENZONATATE 100 MG CAPSULE
100.00 mg | ORAL_CAPSULE | Freq: Three times a day (TID) | ORAL | Status: DC | PRN
Start: 2019-06-11 — End: 2019-06-12
  Administered 2019-06-12: 100 mg via ORAL
  Filled 2019-06-11 (×2): qty 1

## 2019-06-11 MED ORDER — FAMOTIDINE 20 MG TABLET
10.00 mg | ORAL_TABLET | Freq: Two times a day (BID) | ORAL | Status: DC
Start: 2019-06-11 — End: 2019-06-12
  Administered 2019-06-12 (×2): 10 mg via ORAL
  Filled 2019-06-11 (×2): qty 1

## 2019-06-11 MED ORDER — POLYETHYLENE GLYCOL 3350 17 GRAM ORAL POWDER PACKET
17.0000 g | Freq: Every day | ORAL | Status: DC
Start: 2019-06-12 — End: 2019-06-12
  Administered 2019-06-12: 0 g via ORAL

## 2019-06-11 MED ORDER — DEXAMETHASONE 4 MG TABLET
6.0000 mg | ORAL_TABLET | Freq: Every day | ORAL | Status: DC
Start: 2019-06-11 — End: 2019-06-12
  Administered 2019-06-12 (×2): 6 mg via ORAL
  Filled 2019-06-11 (×2): qty 1

## 2019-06-11 MED ORDER — ASPIRIN 81 MG CHEWABLE TABLET
81.0000 mg | CHEWABLE_TABLET | Freq: Every day | ORAL | Status: DC
Start: 2019-06-12 — End: 2019-06-12
  Administered 2019-06-12: 81 mg via ORAL
  Filled 2019-06-11: qty 1

## 2019-06-11 MED ORDER — SODIUM CHLORIDE 0.9 % (FLUSH) INJECTION SYRINGE
2.0000 mL | INJECTION | Freq: Three times a day (TID) | INTRAMUSCULAR | Status: DC
Start: 2019-06-11 — End: 2019-06-12
  Administered 2019-06-12 (×3): 2 mL

## 2019-06-11 MED ORDER — ENOXAPARIN 60 MG/0.6 ML SUBCUTANEOUS SYRINGE
0.5000 mg/kg | INJECTION | Freq: Two times a day (BID) | SUBCUTANEOUS | Status: DC
Start: 2019-06-12 — End: 2019-06-12
  Administered 2019-06-12: 60 mg via SUBCUTANEOUS
  Filled 2019-06-11 (×3): qty 0.6

## 2019-06-11 MED ORDER — ACETAMINOPHEN 325 MG TABLET
650.0000 mg | ORAL_TABLET | ORAL | Status: DC | PRN
Start: 2019-06-11 — End: 2019-06-12
  Administered 2019-06-12: 650 mg via ORAL
  Filled 2019-06-11: qty 2

## 2019-06-11 MED ORDER — SODIUM CHLORIDE 0.9 % (FLUSH) INJECTION SYRINGE
2.0000 mL | INJECTION | INTRAMUSCULAR | Status: DC | PRN
Start: 2019-06-11 — End: 2019-06-12

## 2019-06-11 MED ORDER — ATORVASTATIN 40 MG TABLET
80.00 mg | ORAL_TABLET | Freq: Every evening | ORAL | Status: DC
Start: 2019-06-11 — End: 2019-06-12
  Administered 2019-06-12: 80 mg via ORAL
  Filled 2019-06-11: qty 2

## 2019-06-11 MED ORDER — BUDESONIDE-FORMOTEROL HFA 160 MCG-4.5 MCG/ACTUATION AEROSOL INHALER
2.0000 | INHALATION_SPRAY | Freq: Two times a day (BID) | RESPIRATORY_TRACT | Status: DC
Start: 2019-06-11 — End: 2019-06-12
  Administered 2019-06-11: 0 via RESPIRATORY_TRACT
  Filled 2019-06-11: qty 12

## 2019-06-11 MED ORDER — ALUMINUM-MAG HYDROXIDE-SIMETHICONE 400 MG-400 MG-40 MG/5 ML ORAL SUSP
20.0000 mL | ORAL | Status: DC | PRN
Start: 2019-06-11 — End: 2019-06-12

## 2019-06-11 MED ORDER — MAGNESIUM HYDROXIDE 400 MG/5 ML ORAL SUSPENSION
15.0000 mL | Freq: Four times a day (QID) | ORAL | Status: DC | PRN
Start: 2019-06-11 — End: 2019-06-12

## 2019-06-11 MED ORDER — CLOPIDOGREL 75 MG TABLET
75.0000 mg | ORAL_TABLET | Freq: Every day | ORAL | Status: DC
Start: 2019-06-12 — End: 2019-06-11

## 2019-06-11 MED ORDER — INSULIN LISPRO 100 UNIT/ML SUB-Q SSIP
0.0000 [IU] | INJECTION | Freq: Four times a day (QID) | SUBCUTANEOUS | Status: DC | PRN
Start: 2019-06-11 — End: 2019-06-12
  Administered 2019-06-11 – 2019-06-12 (×2): 2 [IU] via SUBCUTANEOUS
  Filled 2019-06-11: qty 3

## 2019-06-11 MED ORDER — GUAIFENESIN 100 MG/5 ML ORAL LIQUID
200.0000 mg | ORAL | Status: DC | PRN
Start: 2019-06-11 — End: 2019-06-12
  Administered 2019-06-12: 200 mg via ORAL
  Filled 2019-06-11 (×2): qty 10

## 2019-06-11 NOTE — Care Management Notes (Signed)
[facility] INTAKE    PATIENT INFORMATION    PCP: Biagio Quint, PA-C  Effective Insurance:   Payor/Plan Subscr Relation Effective Group Num   1. [place] A Self                                     [phone]       Insurance Comments:     RESERVATION INFORMATION  Received call from:   Phone:   Referring Provider: Jeryl Columbia         Referring Provider Phone: [phone]    Transfer Source: [facility]     Transfer Emergent:  Urgent  Transfer Reason: COVID-19 Confirmed  Transfer Comments:   Admitted on: 06/11/2019   Pt Class and Level of Care: Emergency Emergency  Diagnosis: COVID      CLINICAL INFORMATION  Intubated: No  COVID-19 Confirmed Positive:yes  COVID-19 Pending Result (PUI) (if yes, add date test was sent to lab if known): no    PMH:  Asthma  Dialysis: no  Cancer: no  Isolation: yes  Is patient established with family practice/attending?      Patient story/clinical presentation:per Dr Lizbeth Bark at [facility] ED, pt presented with complaints of SOB and coughing. Pt diagnosed COVID + 12/19. CT chest showed no PE, but pneumonia in all lobes.     qSOFA Score >= 2 = Positive                 Value                   Score    Respiratory Rate >= 22 breaths per minute = 1 point 16 0   Systolic Blood Pressure <= 100 mmHg = 1 point 132 0   Altered Mental Status: GCS <15 = 1 point none 0   Total Score  0     Serum Lactate Level >= 59mol/L (36 mg/ dL)= Positive:  Initial call level (if not available put NA) 2.1   Followed up call level          Positive Screen: no  Antibiotics: yes  Fluids/Pressor:  yes  Blood Cultures:  no  Serum Lactate: yes    Current vital signs:    HR:     80   BP: 132/84   Resp: 16   Temp: 97.7   Sats: 95   O2: RA     Range over last 24hrs:    HR:        BP:    Max Temp:            Per MD labs WNL unless noted below.    Labs:  WBC (3.6-11) 5.4   HGB (13.1-17.3) 18   Hct (39.8-50.2) 55   PLT (140-400)    Na (135-145)    K+ (3.5-5.1)    CL (96-111)    CO2 (23-35)       BUN (8-26)    Cr (0.62-1.27)    Glucose (60-105) 107   Ca (8.5-10.4)    Mag (1.6-2.5)    Phos (2.4-4.7)    BNP (<100) <10   D-Dimer (<233)    AST (8-41)    ALT (<55)    Alk Phos (<150)    Amylase (25-125)    Lipase (10-80)    T. Bili (0.3-1.3)    PT (8.7-13.2)    PTT (25.1-36.5)  INR (0.8-1.2)    Trop-1 (<0.03) <0.01   CK    CPK    CK-MB    ABG ph    ABG PCO2    ABG PO2    ABG HCO3    Base def/exc    LP Glucose    LP Neutrophil    LP Protein    LP WBC    LP Other    Flu A + B neg       Radiology (please place images on image grid):  [id number]  IV Access:  Medications, IVF, Drips: Zithromax, Rocephin, Steroids    Oxygen/Bipap/Vent settings:    TV:        Peep:        FiO2:        Rate:            Admitting Pt Class and Level of Care: Inpatient, Step Down,   Accepted By: Clemens Catholic, HOSPITALIST 44    *Send Text Page to accepting service MD. *

## 2019-06-11 NOTE — Nurses Notes (Signed)
Patient is a 54 year old male admitted from [facility] via stretcher r/t COVID complications. See flowsheet for VSs and assessment. Patient oriented to bed and room, instructed to call for any needs or concerns. Call bell within reach.

## 2019-06-11 NOTE — H&P (Signed)
[facility]  History & Physical     Name: [name] [name], 53 y.o. male Date of Service:  06/11/2019   Date of Birth:  04/25/1966 Date of Admission:  06/11/2019   PCP: Biagio Quint, PA-C Attending: Adelina Mings, MD   MRN: [id number]  Information Obtained from: Patient, EMR and [facility] Records   Chief Complaint: SOB  Code Status: Full Code     HISTORY OF PRESENTING ILLNESS:     [name] [name] is a 53 y.o. male with a significant PMH of HTN, CAD s/p 1v CABG, Asthma, HLD, GERD, OSA who presents to the hospital with SOB as a transfer from [place] states that he first developed symptoms on 06/07/19 of fevers, chills, body aches. 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   On day of presentation to [place] [facility] he developed SOB, inability to catch his breath and some R sided pleuritic CP. He had a CT PE study performed which did not show PE but did show extensive pneumonia in all lung fields.   Pt currently states that he feels better, SOB improved with supplemental oxygen.        MEDICAL HISTORY:     PAST MEDICAL & SURGICAL HISTORIES:   Past Medical History:   Diagnosis Date   . Asthma    . Coronary artery disease    . CPAP (continuous positive airway pressure) dependence    . Esophageal reflux    . Hypercholesterolemia    . Hypertension    . Knee injury    . MI (myocardial infarction) (CMS [place])    . Sleep apnea     Past Surgical History:   Procedure Laterality Date   . CORONARY ARTERY ANGIOPLASTY     . HX DENTAL EXTRACTION     . HX STENTING (ANY)  06/15/2011   . HX TONSIL AND ADENOIDECTOMY          HOME MEDICATIONS:  Medications Prior to Admission     Prescriptions    acetaminophen/diphenhydramine (TYLENOL PM ORAL)    Take 2 Caps by mouth Every night    aspirin 81 mg Oral Tablet, Chewable    Take 1 Tab (81 mg total) by mouth Once a day    atorvastatin (LIPITOR) 40 mg Oral Tablet    Take 2 Tabs (80 mg total)  by mouth Every evening for 90 days    clopidogreL (PLAVIX) 75 mg Oral Tablet    Take 1 tablet by mouth once daily    Famotidine (PEPCID) 10 mg Oral Tablet    Take 10 mg by mouth Twice daily    fluticasone furoate-vilanteroL (BREO ELLIPTA) 200-25 mcg/dose Inhalation Disk with Device    Take 1 INHALATION by inhalation Once a day    ipratropium-albuterol 0.5 mg-3 mg(2.5 mg base)/3 mL Solution for Nebulization    3 mL by Nebulization route Four times a day    metoprolol succinate (TOPROL-XL) 100 mg Oral Tablet Sustained Release 24 hr    Take 100 mg by mouth Once a day    montelukast (SINGULAIR) 10 mg Oral Tablet    Take 10 mg by mouth Every evening    nitroglycerin (NITROSTAT) 0.4 mg Sublingual Tablet, Sublingual    0.4 mg by Sublingual route Every 5 minutes as needed for Chest pain for 3 doses over 15 minutes    pantoprazole (PROTONIX) 20 mg Oral Tablet, Delayed  Release (E.C.)    Take 20 mg by mouth Every morning before breakfast    VENTOLIN HFA 90 mcg/actuation Inhalation HFA Aerosol Inhaler    VITAMIN D 1,250 mcg (50,000 unit) Oral Capsule    Take 50,000 Units by mouth Every 7 days           ALLERGIES:  He is allergic to phenergan [promethazine].     FAMILY HISTORY:  His family history includes Coronary Artery Disease in his father and mother.    SOCIAL HISTORY:  He  reports no history of alcohol use. He  reports that he quit smoking about 27 years ago. He quit after 10.00 years of use. He has never used smokeless tobacco. He  reports no history of drug use.    ROS:      Other than in HPI, all other systems were reviewed and are negative    Denies any abdominal pain, nausea or vomiting. Also denies any diarrhea constipation. Denies any dysuria or increased urinary frequency.         EXAMINATION:         BP (Non-Invasive): (!) 138/99   Respiratory Rate: 18 SpO2: 95 % Pain Score (Numeric, Faces): 5        Constitutional: NAD. Orientedx3, on NC  HENT:   Head: Normocephalic and atraumatic.   Mouth/Throat: Mucous  membranes moist.   Eyes: EOMI, PERRL   Neck: Trachea midline. Neck supple.  Cardiovascular: Regular tachycardia, No murmurs, rubs or gallops.  Pulmonary/Chest: BS equal bilaterally but diminsihed. No respiratory distress. On NC  Abdominal: BS +. Abdomen soft, nontender, nondistended.  No rebound or guarding.               Musculoskeletal/Extremities: No edema, cyanosis, tenderness or deformity.  Skin: Warm and dry. No rash, erythema, pallor or cyanosis  Psychiatric: Normal mood and affect. Behavior is normal.   Neurological: Alert. Grossly intact.         LABS:       CBC Results Differential Results   No results for input(s): WBC, HGB, HCT, PLTCNT, BANDS in the last 72 hours.    Invalid input(s): PLATELETCOUNT No results for input(s): PMNS, LYMPHOCYTES, MONOCYTES, EOSINOPHIL, BASOPHILS, NRBCS, PMNABS, LYMPHSABS, MONOSABS, EOSABS, BASOSABS in the last 72 hours.   BMP Results Other Chemistries Results   No results for input(s): SODIUM, POTASSIUM, CHLORIDE, CO2, BUN, CREATININE, GFR, ANIONGAP in the last 72 hours. No results for input(s): CALCIUM, ALBUMIN, MAGNESIUM, PHOSPHORUS in the last 72 hours.   Coagulation Studies:  No results found for this encounter    Liver/Pancreas:  No results found for this encounter    ESR / CRP: No results found for this encounter    Procalcitonin: No results found for: PRL    LDH: No results found for this encounter    Blood Gas:  No results found for this encounter     Cardiac Markers:  No results for input(s): TROPONINI, CKMB, MBINDEX, BNP in the last 72 hours.    Thyroid Function:  Lab Results   Component Value Date/Time    TSH 1.465 04/18/2019 12:18 PM       Lipid Panel:  No results found for this encounter    Urinalysis:  No results found for this encounter    Urine Drug Screen:  No results for input(s): AMPHETUR, BARBUR, BENZOUR, COCMETUR, METHADONEUR, [place], PHENCYCUR, [place] in the last 72 hours.     IMAGING / STUDIES:         Per  report from outside  facility: CT PE study did NOT show PE but did show PNA in all lung fields.        ASSESSMENT:     [name] [name] is a 53 y.o. male with HTN, CAD s/p CABG and asthma who presents with SOB from [facility]   . Pt tested positive for COVID-19 on 06/09/19 and has new oxygen requirement.   Started on steroids and Lovenox. Not candidate for Remdesivir currently. May be a candidate for COVID-19 trial.        PLAN:     Acute Hypoxic Respiratory Failure, COVID-19 Positive  - Admit to COVID-19 service given symptomatic COVID infection  - Enhanced droplet precautions  - Supplemental O2 and attempt to wean as tolerated  - Combivent PRN, Tessalon, Robitussin  - Trend inflammatory markers  - Start pt on Lovenox 0.44m/kg BID  - Start pt on Dexamethasone 676mPO daily for 10day course or till pt is ready for DC  - PT/OT  - Not candidate for Remdesivir given timeline of presentation and lack of O2 requirement currently.   - Pt may be a candidate for monoclonal antibody study. Will need to contact research team in the AM    CAD s/p CABG LIMA to LAD, HTN  - Continue with Aspirin and Statin  - Pt recently had Plavix and Toprol stopped by his cardiologist given he is >3y67yrut from his CABG  - Monitor BP and treat as appropriate  - Recent MPS was negative  - TEE 04/22/19: EF 60%    OSA  - Continue with CPAP    Asthma  - Pt was taking Trelegy, Singulair. Will switch pt to our formulary equivalents.       [id number](first 30 minutes, min 15 minutes)  Advance care planning including but not limited to explanation and discussion of advance directives such as standard forms (with completion of such forms, when performed), by physician and/or midlevel  Patient after extended discussion regarding goals of care was ultimately made Full Code.  Primary nurse notified.  Orders placed in chart.      DVT/PE Prophylaxis - Enoxaparin  Therapy - OT and PT  Disposition Planning - pending hospital course    KevDahlia BailiffD  Assistant Professor  [facility]

## 2019-06-12 LAB — POC BLOOD GLUCOSE (RESULTS)
GLUCOSE, POC: 200 mg/dL — ABNORMAL HIGH (ref 70–105)
GLUCOSE, POC: 148 mg/dL — ABNORMAL HIGH (ref 70–105)

## 2019-06-12 MED ORDER — BUDESONIDE-FORMOTEROL HFA 160 MCG-4.5 MCG/ACTUATION AEROSOL INHALER - RN
2.00 | Freq: Two times a day (BID) | Status: DC
Start: 2019-06-12 — End: 2019-06-12
  Administered 2019-06-12: 0 via RESPIRATORY_TRACT
  Filled 2019-06-12: qty 6

## 2019-06-12 MED ORDER — BENZOCAINE-MENTHOL SORE THROAT LOZENGE WRAPPER
1.00 | LOZENGE | Status: DC | PRN
Start: 2019-06-12 — End: 2019-06-12
  Filled 2019-06-12: qty 3

## 2019-06-12 MED ORDER — IPRATOPIUM-ALBUTEROL 20 MCG-100MCG/ACTUATION AEROSOL INHALER - RN
1.00 | INHALATION_SPRAY | Freq: Four times a day (QID) | RESPIRATORY_TRACT | Status: DC
Start: 2019-06-12 — End: 2019-06-12
  Administered 2019-06-12: 1 via RESPIRATORY_TRACT
  Filled 2019-06-12: qty 4

## 2019-06-12 MED ORDER — GUAIFENESIN 100 MG/5 ML ORAL LIQUID
200.00 mg | ORAL | 0 refills | Status: DC | PRN
Start: 2019-06-12 — End: 2020-01-15

## 2019-06-12 MED ORDER — DEXAMETHASONE 6 MG TABLET
6.0000 mg | ORAL_TABLET | Freq: Every day | ORAL | 0 refills | Status: AC
Start: 2019-06-13 — End: 2019-06-21

## 2019-06-12 MED ORDER — BENZONATATE 100 MG CAPSULE
100.00 mg | ORAL_CAPSULE | Freq: Three times a day (TID) | ORAL | 1 refills | Status: AC | PRN
Start: 2019-06-12 — End: 2019-06-20

## 2019-06-12 NOTE — Transitional Care (Signed)
[facility] Medicine   Transitional Care Coordination   Initial Assessment       Name: [name] [name]   Date of Birth:  53 y.o.  Date of service: 06/12/2019  Lay Caregiver:  ,  ,         1. Patient appointment preferences: any  2. Transportation to healthcare appointments: yes   3. PCP listed as Biagio Quint, PA-C or Lanora Manis. This is correct per Sherren Mocha. Last visit: last week. Obtained verbal consent from [facility] to contact PCP.   4. Patient MyChart status: active  5. Preferred method of contact: [phone]   Confirmed patient contact information:   Home Phone [phone]   Work Phone [phone]   Mobile [phone]       Spoke with Sherren Mocha via bedside phone to discuss provider of primary care services.  Discussed role of Transitional Care Coordination Team and informed of contact within 2 business days of discharge to assess follow-up plan.  Ella Jubilee LPN Hospitalist Coordinator [place] 06/12/19 at 13:54.

## 2019-06-12 NOTE — Transitional Care (Signed)
Hospital follow up appointment scheduled for 12/30 which is within 7 days of discharge.  Discharge date/time along with coordinators phone number added to after visit summary.Ella Jubilee LPN Hospitalist Coordinator [place] 06/12/19 at 14:00.

## 2019-06-12 NOTE — Discharge Instructions (Signed)
If you have any questions after you are discharged, but before you see your Primary Care Physician you can contact the Hospitalist Service Coordinator at [phone] Monday-Friday 8:00am to 4:30pm.  After hours call [phone] and ask to speak to the MD on-call.  If you are having a medical or mental health emergency call 911 or seek emergent care.

## 2019-06-12 NOTE — Care Management Notes (Signed)
[facility] Management Note    Patient Name: [name] [name]  Date of Birth:   Sex: male  Date/Time of Admission: 06/11/2019  9:17 PM  Room/Bed: 15/A  Payor: [employer] / Plan: [place] / Product Type: Medicaid [place] /    LOS: 1 day   Primary Care Providers:  Biagio Quint, PA-C, PA-C (General)    Admitting Diagnosis:  COVID-19 virus infection [U07.1]    Assessment:      06/12/19 1215   Assessment Details   Assessment Type Continued Assessment   Date of Care Management Update 06/12/19   Date of Next DCP Update 06/13/19   Readmission   Is this a readmission? No   Care Management Plan   Discharge Planning Status initial meeting   Projected Discharge Date 06/15/19   Discharge plan discussed with: Spouse   CM will evaluate for rehabilitation potential yes   Patient choice offered to patient/family no   Form for patient choice reviewed/signed and on chart no   Patient aware of possible cost for ambulance transport?  No   Discharge Needs Assessment   Equipment Currently Used at Home none   Equipment Needed After Discharge none   Discharge Facility/Level of Care Needs Home (Patient/Family Member/other)(code 1)   Transportation Available car;family or friend will provide   Referral Information   Admission Type inpatient   Address Verified verified-no changes   Arrived From home or self-care   Insurance Verified verified-no change   ADVANCE DIRECTIVES   Does the Patient have an Advance Directive? Yes, Patient Does Have Advance Directive for Healthcare Treatment   Document the Substance of the Advance Directive (Required) MPOA   Type of Advance Directive Completed Medical Power of Attorney   Copy of Advance Directives in Chart? 0   Name of MPOA or Healthcare Surrogate Krishay Faro - wife   Phone Number of MPOA or Healthcare Surrogate [phone]   Patient Requests Assistance in Having Advance Directive Notarized. N/A   Employment/Financial   Patient has Prescription Coverage?   Yes   Financial Concerns none   Living Environment   Lives With spouse   Living Arrangements house   Able to Return to Prior Arrangements yes   Living Arrangement Comments Pt lives w his wife in a one storey house w 2 STE and a rail. There is a tub/shower   Home Safety   Home Assessment: No Problems Identified   Home Accessibility no concerns;tub/shower is not walk in;stairs to enter home   Legal Issues   Do you have a court appointed guardian/conservator? No   Patient Hand-Off   Clinical/Discharge Plan of Care Information Communicated to:  Clinical Care Coordinator   Attempted to phone patient to complete initial assessment; pt did not answer phone; phoned wife Suanne Marker, who is MPOA, on [phone]. Per Suanne Marker, pt lives w her in a 1 storey house w 2 STE and a rail. There is a tub/shower. The patient uses no DME. He can walk 2+ blocks when well. He will have transport home at DC. Per Suanne Marker, the doctors have not contacted her yet about her husband. Will follow for developing DC needs.    Discharge Plan:  Home (Patient/Family Member/other) (code 1)      The patient will continue to be evaluated for developing discharge needs.     Case Manager: Elly Modena, [place] COORDINATOR  Phone: [phone]

## 2019-06-12 NOTE — Nurses Notes (Signed)
Patient discharged home with family.  AVS reviewed with patient/care giver.  A written copy of the AVS and discharge instructions was given to the patient/care giver.  Questions sufficiently answered as needed.  Patient/care giver encouraged to follow up with PCP as indicated.  In the event of an emergency, patient/care giver instructed to call 911 or go to the nearest emergency room. Pt discharged tomorrow with wife, no further questions regarding discharge instructions or medications. Pt stable at discharge.

## 2019-06-12 NOTE — Nurses Notes (Signed)
Pt's home medications returned to bedside.

## 2019-06-12 NOTE — Care Plan (Signed)
Patient was able to rest most of the night had some complaints of rib pain started on steroids and possibly on antibody trail project today given Lovenox and continued on home inhalers fall precautions in place will continue to monitor  Problem: Adult Inpatient Plan of Care  Goal: Plan of Care Review  Outcome: Ongoing (see interventions/notes)     Problem: Pain Acute  Goal: Optimal Pain Control  Outcome: Ongoing (see interventions/notes)

## 2019-06-12 NOTE — Progress Notes (Addendum)
[facility]  Medicine Progress Note    [name] [name]  Date of service: 06/12/2019+  Date of Admission:  06/11/2019    Hospital Day:  LOS: 1 day   Subjective:  The patient he feels that he feels much better today than he did yesterday.  He states that he feels that the steroids and breathing treatments for his COVID have helped tremendously.  He is denying any cough, shortness of breath, wheezing, or chest pain at this time.  He states he is very anxious to get home as he does not want to get caught in the snowstorm tomorrow.    Vital Signs:  Temp  Avg: 36.8 C (98.2 F)  Min: 36.6 C (97.9 F)  Max: 37 C (98.6 F)    Pulse  Avg: 102.3  Min: 101  Max: 106 BP  Min: 137/94  Max: 145/100   Resp  Avg: 16.5  Min: 16  Max: 18 SpO2  Avg: 94.8 %  Min: 94 %  Max: 95 %   Pain Score (Numeric, Faces): 0      Input/Output    Intake/Output Summary (Last 24 hours) at 06/12/2019 1147  Last data filed at 06/12/2019 1125  Gross per 24 hour   Intake 1160 ml   Output 550 ml   Net 610 ml    I/O last shift:  12/23 0700 - 12/23 1859  In: 1160 [P.O.:1150; I.V.:10]  Out: -        Physical Exam:    Constitutional:  Appears stated age, in no distress  Eyes:  Conjunctiva clear, EOMI  ENT:  ENMT without erythema or injection, mucous membranes moist, No oral lesions   Neck:  no thyromegaly or lymphadenopathy  Respiratory:  Clear to auscultation bilaterally, normal percussion bilaterally, no intercostal retractions noted  Cardiovascular:  regular rate and rhythm, S1, S2 normal, no murmur, click, rub or gallop  Gastrointestinal:  Soft, non-tender, non-distended, No hepatosplenomegaly  Musculoskeletal:  Strength 5/5 in all extremities with no obvious joint deformity present  Integumentary:  No rashes and No lesions.  Turgor normal  Neurologic:  Grossly normal, Alert and oriented X 3,  Grossly Normal strength and tone.   Psychiatric:  Affect normal, speech normal       Inpatient medications:    .  acetaminophen (TYLENOL) tablet, 650 mg,  Oral, Q4H PRN    .  aluminum-magnesium hydroxide-simethicone (MAALOX MAX) 400-400-40mg  per 5mL oral liquid, 20 mL, Oral, Q4H PRN    .  aspirin chewable tablet 81 mg, 81 mg, Oral, Daily    .  atorvastatin (LIPITOR) tablet, 80 mg, Oral, QPM    .  benzonatate (TESSALON) capsule, 100 mg, Oral, Q8H PRN    .  budesonide-formoterol (SYMBICORT) 160 mcg-4.5 mcg per inhalation oral inhaler - "Nursing to administer", 2 Puff, Inhalation, 2x/day    .  dexAMETHasone (DECADRON) tablet 6 mg, 6 mg, Oral, Daily    .  enoxaparin PF (LOVENOX) 60 mg/0.6 mL SubQ injection, 0.5 mg/kg, Subcutaneous, Q12H    .  famotidine (PEPCID) tablet, 10 mg, Oral, 2x/day    .  guaiFENesin (ROBITUSSIN) 100mg  per 5mL oral liquid - for cough (expectorant), 200 mg, Oral, Q4H PRN    .  ipratropium-albuterol (COMBIVENT RESPIMAT) 20 mcg-100 mcg per inhalation oral inhaler - "Nursing to administer", 1 Puff, Inhalation, 4x/day    .  magnesium hydroxide (MILK OF MAGNESIA) 400mg  per 5mL oral liquid, 15 mL, Oral, 4x/day PRN    .  montelukast (SINGULAIR) 10  mg tablet, 10 mg, Oral, QPM    .  NS flush syringe, 2-6 mL, Intracatheter, Q8HRS    And    .  NS flush syringe, 2-6 mL, Intracatheter, Q1 MIN PRN    .  ondansetron (ZOFRAN) 2 mg/mL injection, 4 mg, Intravenous, Q6H PRN    .  polyethylene glycol (MIRALAX) oral packet, 17 g, Oral, Daily    .  sennosides-docusate sodium (SENOKOT-S) 8.6-50mg  per tablet, 1 Tab, Oral, 2x/day PRN    .  SSIP insulin lispro (HUMALOG) 100 units/mL injection, 0-12 Units, Subcutaneous, 4x/day PRN        Labs:  CBC Results Differential Results   Recent Results (from the past 30 hour(s))   CBC WITH DIFF    Collection Time: 06/11/19 10:20 PM   Result Value    WBC 6.4    HGB 17.7 (H)    HCT 50.7    PLATELETS 218    Recent Results (from the past 30 hour(s))   CBC WITH DIFF    Collection Time: 06/11/19 10:20 PM   Result Value    WBC 6.4    NEUTROPHIL % 91    MONOCYTE % 1    BASOPHIL % 0    BASOPHIL # <0.10      BMP Results Other Chemistries  Results   Results for orders placed or performed during the hospital encounter of 06/11/19 (from the past 30 hour(s))   BASIC METABOLIC PANEL    Collection Time: 06/11/19 10:20 PM   Result Value    SODIUM 137    POTASSIUM 4.2    CHLORIDE 102    CO2 TOTAL 21 (L)    GLUCOSE 199 (H)    BUN 17    CREATININE 1.09    Recent Results (from the past 30 hour(s))   PHOSPHORUS    Collection Time: 06/11/19 10:20 PM   Result Value    PHOSPHORUS 2.8   MAGNESIUM    Collection Time: 06/11/19 10:20 PM   Result Value    MAGNESIUM 1.8      Liver/Pancreas Enzyme Results Liver Function Results   Recent Results (from the past 30 hour(s))   LDH    Collection Time: 06/11/19 10:20 PM   Result Value    LDH 371 (H)   HEPATIC FUNCTION PANEL    Collection Time: 06/11/19 10:20 PM   Result Value    ALKALINE PHOSPHATASE 95    ALT (SGPT) 31    AST (SGOT) 28    Recent Results (from the past 30 hour(s))   HEPATIC FUNCTION PANEL    Collection Time: 06/11/19 10:20 PM   Result Value    ALBUMIN 4.1    BILIRUBIN TOTAL 0.5    BILIRUBIN DIRECT 0.2      Cardiac Results Coags Results   No results found for this or any previous visit (from the past 30 hour(s)). Recent Results (from the past 30 hour(s))   FIBRINOGEN    Collection Time: 06/11/19 10:20 PM   Result Value    FIBRINOGEN 352   D-DIMER    Collection Time: 06/11/19 10:20 PM   Result Value    D-DIMER <200   PTT (PARTIAL THROMBOPLASTIN TIME)    Collection Time: 06/11/19 10:20 PM   Result Value    APTT 31.3   PT/INR    Collection Time: 06/11/19 10:20 PM   Result Value    PROTHROMBIN TIME 11.2    INR 0.97  Radiology:    reviewed CT/Xray/US       PT/OT: No    Consults:: None    Hardware (lines, foley's, tubes):       Assessment/ Plan:   Active Hospital Problems    Diagnosis   . COVID-19 virus infection     [name] [name] is a 53 y.o. male with a PMH of HTN, CAD s/p CABG, and asthma who presented to an outside facility for SOB found to be COVID + with new O2 requirement transferred to [facility] for further  evaluation and management     Acute hypoxic respiratory failure 2/2 COVID-19 - Improved  - Admit to COVID-19 service given symptomatic COVID infection  - Enhanced droplet precautions  - Patient was able to be weaned off of oxygen is now on room air  - Combivent PRN, Tessalon, Robitussin  - Trend inflammatory markers  - Start pt on Lovenox 0.5mg /kg BID  - Start pt on Dexamethasone 6mg  PO daily for 10day course or till pt is ready for  DC  - Not candidate for Remdesivir given timeline of presentation and lack of O2 requirement currently   - Continue CPAP for OSA     CAD s/p CABG LIMA, LAD  HTN  HLD  - Continue with Aspirin and Statin  - Pt recently had Plavix and Toprol stopped by his cardiologist given he is >69yrs out from his CABG  - Monitor BP and treat as appropriate  - Recent MPS was negative  - TEE 04/22/19: EF 60%    Asthma  - Pt on Trelegy, Singulair at home, switched to Symbicort and Combivent 2/2 formulary       DVT/PE Prophylaxis:  Enoxaparin    Anticoagulants (last 24 hours)     Date/Time Action Medication Dose    06/12/19 0016 Given    enoxaparin PF (LOVENOX) 60 mg/0.6 mL SubQ injection 60 mg           Disposition Planning: Home discharge       35  minutes were spent on discharge medication reconciliation,  care of plan/future plan  and coordination of care among the physician, nursing staff and care cordinators.  At least half of this time was spent on patient counseling about him medical condition  ,  medications and care of plan/future plan      06/14/19, APRN    I personally saw and evaluated the patient. See  APP/Advance practice provider note for additional details. My findings/participation are:    saw the patient, offers no compalints, will be send to home    Diana Eves, MD  Assistant Professor of Medicine  Section of [facility] Medicine  Pager # [phone]

## 2019-06-12 NOTE — Discharge Summary (Signed)
[facility]  DISCHARGE SUMMARY    PATIENT NAME:  [name] [name], [name] [name]  MRN:  [id number]  DOB:      ENCOUNTER DATE:  06/11/2019  INPATIENT ADMISSION DATE: 06/11/2019  DISCHARGE DATE:  06/12/2019    ATTENDING PHYSICIAN: Blenda Peals, MD  SERVICE: HOSPITALIST COV-19  PRIMARY CARE PHYSICIAN: Randol Kern, PA-C       PRIMARY DISCHARGE DIAGNOSIS:    Active Hospital Problems    Diagnosis Date Noted   . COVID-19 virus infection [U07.1] 06/11/2019      Resolved Hospital Problems   No resolved problems to display.     Active Non-Hospital Problems    Diagnosis Date Noted   . 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 Hypokalemia 09/07/2016   . Unstable angina (CMS HCC) 09/06/2016   . Chest pain 04/22/2016   . CAD (coronary artery disease) 01/25/2013        DISCHARGE MEDICATIONS:     Current Discharge Medication List      START taking these medications.      Details   benzonatate 100 mg Capsule  Commonly known as: TESSALON   100 mg, Oral, EVERY 8 HOURS PRN  Qty: 12 Cap  Refills: 1     Dexamethasone 6 mg Tablet  Commonly known as: DECADRON  Start taking on: June 13, 2019   6 mg, Oral, DAILY  Qty: 8 Tab  Refills: 0     guaiFENesin 100 mg/5 mL Liquid  Commonly known as: ROBITUSSIN   200 mg, Oral, EVERY 4 HOURS PRN  Qty: 473 mL  Refills: 0        CONTINUE these medications - NO CHANGES were made during your visit.      Details   aspirin 81 mg Tablet, Chewable   81 mg, Oral, DAILY  Qty: 60 Tab  Refills: 2     atorvastatin 40 mg Tablet  Commonly known as: LIPITOR   80 mg, Oral, EVERY EVENING  Qty: 180 Tab  Refills: 0     Breo Ellipta 200-25 mcg/dose Disk with Device  Generic drug: fluticasone furoate-vilanteroL   1 INHALATION, Inhalation, DAILY  Qty: 3 Each  Refills: 4     clopidogreL 75 mg Tablet  Commonly known as: PLAVIX   Take 1 tablet by mouth once daily  Qty: 90 Tab  Refills: 0     famotidine 10 mg Tablet  Commonly known as: PEPCID   10 mg, Oral, 2  TIMES DAILY  Refills: 0     ipratropium-albuteroL 0.5 mg-3 mg(2.5 mg base)/3 mL Solution for Nebulization  Commonly known as: DUONEB   3 mL, Nebulization, 4 TIMES DAILY  Refills: 0     metoprolol succinate 100 mg Tablet Sustained Release 24 hr  Commonly known as: TOPROL-XL   100 mg, Oral, DAILY  Refills: 0     montelukast 10 mg Tablet  Commonly known as: SINGULAIR   10 mg, Oral, EVERY EVENING  Refills: 0     nitroGLYCERIN 0.4 mg Tablet, Sublingual  Commonly known as: NITROSTAT   0.4 mg, EVERY 5 MIN PRN  Refills: 0     pantoprazole 20 mg Tablet, Delayed Release (E.C.)  Commonly known as: PROTONIX   20 mg, Oral, EVERY MORNING BEFORE BREAKFAST  Refills: 0     TYLENOL PM ORAL   2 Caps, Oral, NIGHTLY  Refills: 0     Ventolin HFA 90 mcg/actuation HFA Aerosol Inhaler  Generic drug: albuterol  sulfate   No dose, route, or frequency recorded.  Refills: 0     Vitamin D 1,250 mcg (50,000 unit) Capsule  Generic drug: ergocalciferol (vitamin D2)   50,000 Units, Oral, EVERY 7 DAYS  Refills: 0          Discharge med list refreshed?  YES       ALLERGIES:  Allergies   Allergen Reactions   . Phenergan [Promethazine] Seizure       HOSPITAL PROCEDURE(S):   Bedside Procedures:  No orders of the defined types were placed in this encounter.    Surgical     REASON FOR HOSPITALIZATION AND HOSPITAL COURSE     BRIEF HPI:  This is a 53 y.o., male admitted for [place] NARRATIVE: [name] [name] is a 53 y.o. male with a PMH of hypertension, CAD s/p CABG, and asthma who originally presented as a transfer from [place] [facility] for increased shortness of breath.  Per the patient he originally developed COVID symptoms on 12/18 and occluded chills, fevers, body aches.  He reports that he was tested that day and has a positive.  The patient initially presented on 12/22 for increased shortness of breath.  He was initially on oxygen, however he was very quickly weaned down to room air.  He has not required any supplemental oxygen for the  last 16 hours.  He has also been started on dexamethasone as well as Tessalon and guaifenesin for any cough.  At this time, as the patient is not requiring any supplemental oxygen, and is also requesting to be discharged as he is afraid he will not be able to make it home tomorrow with an impending snowstorm, he is stable for discharge at this time.     TRANSITION/POST DISCHARGE CARE/PENDING TESTS/REFERRALS:   PCP follow-up on 12/30         CONDITION ON DISCHARGE:  A. Ambulation: Full ambulation  B. Self-care Ability: Complete  C. Cognitive Status Alert and Oriented x 3  D. Code status at discharge:   Code Status Information     Code Status    Full Code                 LINES/DRAINS/WOUNDS AT DISCHARGE:   Patient Lines/Drains/Airways Status    Active Line / Dialysis Catheter / Dialysis Graft / Drain / Airway / Wound     Name: Placement date: Placement time: Site: Days:    Peripheral IV Right Median Cubital  (antecubital fossa)  06/11/19   --   1    Peripheral IV Anterior;Distal;Left;Upper Arm  06/11/19   2329   less than 1                DISCHARGE DISPOSITION:  Home discharge              DISCHARGE INSTRUCTIONS:  Follow-up Information     Biagio Quint, PA-C On 06/19/2019.    Specialty: EXTERNAL  Why: Hospital discharge follow up at 10:30 via [place] information:  [facility]  [address]  [place] [address]  [phone]                 No discharge procedures on file.             Melanie Crazier, APRN    Copies sent to Care Team       Relationship Specialty Notifications Start End    Biagio Quint, [place] PCP - General  EXTERNAL  04/26/18     Phone: [phone] Fax: [phone]         [facility] [address] [place] [phone]            Referring providers can utilize [email or web address] to access their referred [facility] patient's information.       Diana EvesIan Scudiere, APRN 06/12/2019, 14:16               Late entry for 06/12/19. I personally saw and evaluated  the patient. See mid-level's note for additional details. My findings/participation are  discharged home    Blenda PealsMuhammad Zia Genesee Nase, MD

## 2019-06-12 NOTE — Care Plan (Signed)
Attempted to phone patient to complete initial assessment; pt did not answer phone; phoned wife [name] [name], who is MPOA, on [phone]. Per [name] [name], pt lives w her in a 1 storey house w 2 STE and a rail. There is a tub/shower. The patient uses no DME. He can walk 2+ blocks when well. He will have transport home at DC. Per [name] [name], the doctors have not contacted her yet about her husband. Will follow for developing DC needs.

## 2019-06-13 ENCOUNTER — Telehealth (HOSPITAL_COMMUNITY): Payer: Self-pay

## 2019-06-13 NOTE — Telephone Encounter (Signed)
Transition of Care Contact  Discharge Date: 06/12/2019  Transition Facility Type--Hospital (Inpatient or Observation)  [facility]  Interactive Contact(s): Completed or attempted contact indicated by Date/Time  Completed Contact  First Attempt--06/13/2019  9:46 AM  Second Attempt  Third Attempt  Contact Method(s)-- Patient/Caregiver Telephone  Clinical Staff Name/Role who contacted--Cameryn Chrisley, LPN   Transition Note:   Completed TOC call with pt.  Reviewed AVS.  All questions answered.  Has callback number for future issues.    [place], LPN  09/32/6712, 45:80     Further information may be documented in relevant telephone or outreach encounter.

## 2019-06-27 ENCOUNTER — Ambulatory Visit (HOSPITAL_BASED_OUTPATIENT_CLINIC_OR_DEPARTMENT_OTHER): Payer: Self-pay | Admitting: Internal Medicine

## 2019-08-08 ENCOUNTER — Encounter (HOSPITAL_BASED_OUTPATIENT_CLINIC_OR_DEPARTMENT_OTHER): Payer: Self-pay | Admitting: Internal Medicine

## 2019-08-12 ENCOUNTER — Encounter (HOSPITAL_BASED_OUTPATIENT_CLINIC_OR_DEPARTMENT_OTHER): Payer: Self-pay | Admitting: Internal Medicine

## 2019-09-26 ENCOUNTER — Encounter (HOSPITAL_COMMUNITY): Payer: Self-pay | Admitting: Internal Medicine

## 2019-09-30 ENCOUNTER — Other Ambulatory Visit: Payer: Self-pay

## 2019-09-30 ENCOUNTER — Other Ambulatory Visit (INDEPENDENT_AMBULATORY_CARE_PROVIDER_SITE_OTHER): Payer: Self-pay | Admitting: Pharmacist

## 2019-09-30 ENCOUNTER — Ambulatory Visit: Payer: MEDICAID | Attending: Internal Medicine | Admitting: Internal Medicine

## 2019-09-30 ENCOUNTER — Encounter (HOSPITAL_BASED_OUTPATIENT_CLINIC_OR_DEPARTMENT_OTHER): Payer: Self-pay | Admitting: Internal Medicine

## 2019-09-30 VITALS — BP 120/88 | HR 90 | Temp 97.1°F | Ht 75.0 in | Wt 270.0 lb

## 2019-09-30 DIAGNOSIS — G4733 Obstructive sleep apnea (adult) (pediatric): Secondary | ICD-10-CM

## 2019-09-30 DIAGNOSIS — Z87891 Personal history of nicotine dependence: Secondary | ICD-10-CM

## 2019-09-30 DIAGNOSIS — J3081 Allergic rhinitis due to animal (cat) (dog) hair and dander: Secondary | ICD-10-CM

## 2019-09-30 DIAGNOSIS — D721 Eosinophilia, unspecified: Secondary | ICD-10-CM

## 2019-09-30 DIAGNOSIS — D7219 Other eosinophilia: Secondary | ICD-10-CM

## 2019-09-30 DIAGNOSIS — J45909 Unspecified asthma, uncomplicated: Secondary | ICD-10-CM

## 2019-09-30 DIAGNOSIS — J4550 Severe persistent asthma, uncomplicated: Secondary | ICD-10-CM

## 2019-09-30 MED ORDER — PREDNISONE 20 MG TABLET
40.0000 mg | ORAL_TABLET | Freq: Every day | ORAL | 0 refills | Status: AC
Start: 2019-09-30 — End: 2019-10-05

## 2019-09-30 MED ORDER — BENRALIZUMAB 30 MG/ML SUBCUTANEOUS SYRINGE
30.0000 mg | INJECTION | SUBCUTANEOUS | 0 refills | Status: AC
Start: 2019-09-30 — End: 2020-09-29

## 2019-09-30 NOTE — Progress Notes (Signed)
Pulmonary/Critical Care/Sleep  [address]  [place] [address]  Phone: [phone]  Fax: [phone]    NAME: [name] [name]  DOB:    AGE:  54 y.o.  MRN:  [id number]  APPT:  09/30/2019  3:30 PM EDT    Primary Care Physician: Randol Kern, PA-C   Referring Physician: Gust Rung, MD  [address]  [place],  [place] [address]      CHIEF COMPLAINT:  Follow-up for asthma    History of Present Illness   [name] [name] is a 54 y.o. male  who was contacted today for follow-up for asthma.  The patient was diagnosed with asthma at age of 69 however he used to smoke 1 pack per day for 10 years and quit smoking 26 years ago.    The patient is currently using Breo DuoNeb albuterol and Singulair.  His symptoms are still not well controlled and the patient is currently on prednisone to control his asthma symptoms.  He had noted a usage of prednisone during the last 12 months.  Patient is using his rescue inhalers more than 3-4 times per day.  Of note the patient has history of sleep apnea is currently using a CPAP.  Patient has a cat at home.  ABG done at that.  Showed a pH of 7.4 with pCO2 of 40     REVIEW OF SYSTEMS:    Other than ROS in the HPI, all other systems were negative.    Past Medical History:  Past Medical History:   Diagnosis Date    Asthma     Coronary artery disease     CPAP (continuous positive airway pressure) dependence     Esophageal reflux     Hypercholesterolemia     Hypertension     Knee injury     MI (myocardial infarction) (CMS HCC)     Sleep apnea          Surgical history:  Past Surgical History:   Procedure Laterality Date    CORONARY ARTERY ANGIOPLASTY      HX DENTAL EXTRACTION      HX STENTING (ANY)  06/15/2011    HX TONSIL AND ADENOIDECTOMY           Social history:  He reports that he quit smoking about 27 years ago. He has a 32.00 pack-year smoking history. He has never used smokeless tobacco. He reports that he does not drink alcohol and does  not use drugs.  Family history:  His family history includes Coronary Artery Disease in his father and mother.    Allergies:  Phenergan [promethazine]    Medications:  Current Outpatient Medications   Medication Sig    acetaminophen/diphenhydramine (TYLENOL PM ORAL) Take 2 Caps by mouth Every night    aspirin 81 mg Oral Tablet, Chewable Take 1 Tab (81 mg total) by mouth Once a day (Patient not taking: Reported on 09/30/2019)    atorvastatin (LIPITOR) 40 mg Oral Tablet Take 2 Tabs (80 mg total) by mouth Every evening for 90 days    benralizumab (FASENRA) 30 mg/mL Subcutaneous Syringe 1 mL (30 mg total) by Subcutaneous route Every 8 weeks    clopidogreL (PLAVIX) 75 mg Oral Tablet Take 1 tablet by mouth once daily (Patient not taking: Reported on 09/30/2019)    Famotidine (PEPCID) 10 mg Oral Tablet Take 10 mg by mouth Twice daily    fluticasone furoate-vilanteroL (BREO ELLIPTA) 200-25 mcg/dose Inhalation Disk with Device  Take 1 INHALATION by inhalation Once a day    guaiFENesin (ROBITUSSIN) 100 mg/5 mL Oral Liquid Take 10 mL (200 mg total) by mouth Every 4 hours as needed    ipratropium-albuterol 0.5 mg-3 mg(2.5 mg base)/3 mL Solution for Nebulization 3 mL by Nebulization route Four times a day    metoprolol succinate (TOPROL-XL) 100 mg Oral Tablet Sustained Release 24 hr Take 100 mg by mouth Once a day (Patient not taking: Reported on 09/30/2019)    montelukast (SINGULAIR) 10 mg Oral Tablet Take 10 mg by mouth Every evening    nitroglycerin (NITROSTAT) 0.4 mg Sublingual Tablet, Sublingual 0.4 mg by Sublingual route Every 5 minutes as needed for Chest pain for 3 doses over 15 minutes (Patient not taking: Reported on 09/30/2019)    pantoprazole (PROTONIX) 20 mg Oral Tablet, Delayed Release (E.C.) Take 20 mg by mouth Every morning before breakfast    predniSONE (DELTASONE) 10 mg Oral Tablet Take 10 mg by mouth Once a day    predniSONE (DELTASONE) 20 mg Oral Tablet Take 2 Tablets (40 mg total) by mouth Once a  day for 5 days    traMADoL (ULTRAM) 50 mg Oral Tablet Take by mouth Every 6 hours as needed for Pain (Patient not taking: Reported on 09/30/2019)    VENTOLIN HFA 90 mcg/actuation Inhalation HFA Aerosol Inhaler     VITAMIN D 1,250 mcg (50,000 unit) Oral Capsule Take 50,000 Units by mouth Every 7 days         PHYSICAL EXAM:   Vitals:    09/30/19 1531   BP: 120/88   Pulse: 90   Temp: 36.2 C (97.1 F)   SpO2: 98%   Weight: 122 kg (270 lb)   Height: 1.905 m (6\' 3" )   BMI: 33.82       GS: Patient is in no acute distress, oriented   HEENT: Non icteric sclera,  wearing a face mask  Respiratory:  Good bilateral air entry.  No current wheezes or rales.  No current distress.  SKIN The facial skin has no visible rash      Labs and Imaging   Lab Results   Component Value Date    WBC 6.4 06/11/2019    RBC 5.95 06/11/2019    HGB 17.7 (H) 06/11/2019    HCT 50.7 06/11/2019    MCV 85.2 06/11/2019    MCH 29.7 06/11/2019    MCHC 34.9 06/11/2019    RDW 13.0 04/22/2019    MPV 10.8 06/11/2019      Lab Results   Component Value Date    BUN 17 06/11/2019    CREATININE 1.09 06/11/2019    CO2 21 (L) 06/11/2019    CALCIUM 9.1 06/11/2019    ALKPHOS 95 06/11/2019    AST 28 06/11/2019    ALT 31 06/11/2019    BNP 38 04/18/2019         PFTs 10/2018    Pulmonary function tests     FVC: 95 %predicted   FEV1:  94%predicted   Ratio:  72   TLC:   101 %predicted   DLCO: 56 %predicted      Summaries.   The FVC, FEV1, and FEV1/FVC are normal. There is no significant   response to inhaled bronchodilator. The FRC is normal. The TLC   is normal. The diffusing capacity is decreased.     Interpretations.   The decreased diffusion capacity is suggestive of pulmonary   vascular disease, anemia occult emphysema and/or early  interstitial lung disease.     Results for COLETON, WOON (MRN [id number]) as of 09/30/2019 16:31   Ref. Range 04/22/2019 05:25   EOSINOPHIL Latest Units: % 2      Results for ANTWAIN, CALIENDO (MRN [id number]) as of 09/30/2019  16:31   Ref. Range 11/16/2018 14:50   [place] POPLAR (T14) INTERPRETATION Latest Ref Range: Absent / Undetectable  Absent / Undetectable   COTTON[place]OD POPLAR (T14) Latest Ref Range: <0.10 kU/L <0.10   ALTERNARIA TENUIS (M6) INTERPRETATION Latest Ref Range: Absent / Undetectable  Absent / Undetectable   ALTERNARIA TENUIS (M6) Latest Ref Range: <0.10 kU/L <0.10   [place] GRASS (G2) INTERPRETATION Latest Ref Range: Absent / Undetectable  Very Low Level   [place] GRASS (G2) Latest Ref Range: <0.10 kU/L 0.13 (H)   CAT DANDER (E1) INTERPRETATION Latest Ref Range: Absent / Undetectable  High Level   CAT DANDER (E1) Latest Ref Range: <0.10 kU/L 11.00 (H)   ELM (T8) INTERPRETATION Latest Ref Range: Absent / Undetectable  Absent / Undetectable   ELM (T8) Latest Ref Range: <0.10 kU/L <0.10   HOUSE DUST MITE (D2) (DERMATOPHAGOIDES FARINAE) INTERPRETATION Latest Ref Range: Absent / Undetectable  High Level   HOUSE DUST MITE (D2) (DERMATOPHAGOIDES FARINAE) Latest Ref Range: <0.10 kU/L 8.76 (H)   MEADOW GRASS ([place] BLUE) (G8) INTERPRETATIOn Latest Ref Range: Absent / Undetectable  Moderate Level   MEADOW GRASS ([place] BLUE) (G8) Latest Ref Range: <0.10 kU/L 1.75 (H)   OAK (T7) INTERPRETATION Latest Ref Range: Absent / Undetectable  Absent / Undetectable   OAK (T7) Latest Ref Range: <0.10 kU/L <0.10   ORCHARD GRASS (COCKSFOOT) (G3) INTERPRETATION Latest Ref Range: Absent / Undetectable  Moderate Level   ORCHARD GRASS (COCKSFOOT) (G3) Latest Ref Range: <0.10 kU/L 1.54 (H)   COMMON SILVER BIRCH (T3) INTERPRETATION Latest Ref Range: Absent / Undetectable  Absent / Undetectable   COMMON SILVER BIRCH (T3) Latest Ref Range: <0.10 kU/L <0.10   TIMOTHY GRASS (G6) INTERPRETATION Latest Ref Range: Absent / Undetectable  Moderate Level   TIMOTHY GRASS (G6) Latest Ref Range: <0.10 kU/L 1.51 (H)   BOX-ELDER (T1) INTERPRETATION Latest Ref Range: Absent / Undetectable  Absent / Undetectable   BOX-ELDER (T1) Latest Ref Range: <0.10 kU/L  <0.10   PENICILLIUM CHRYSOGENUM (M1) INTERPRETATION Latest Ref Range: Absent / Undetectable  Absent / Undetectable   PENICILLIUM CHRYSOGENUM (M1) Latest Ref Range: <0.10 kU/L <0.10   ASPERGILLUS FUMIGATUS (M3) INTERPRETATION Latest Ref Range: Absent / Undetectable  Absent / Undetectable   ASPERGILLUS FUMIGATUS (M3) Latest Ref Range: <0.10 kU/L <0.10     Results for ARNAV, CREGG (MRN [id number]) as of 09/30/2019 16:31   Ref. Range 11/16/2018 14:50   COCKROACH, GERMAN (I6) INTERPRETATION Latest Ref Range: Absent / Undetectable  Very Low Level   COCKROACH, GERMAN (I6) Latest Ref Range: <0.10 kU/L 0.11 (H)   JOHNSON GRASS (G10) INTERPRETATION Latest Ref Range: Absent / Undetectable  Very Low Level   [place]OH[place]ON GRASS (G10) Latest Ref Range: <0.10 kU/L 0.28 (H)   MOUSE EPITHELIUM (E71) INTERPRETATION Latest Ref Range: Absent / Undetectable  Absent / Undetectable   MOUSE EPITHELIUM (E71) Latest Ref Range: <0.10 kU/L <0.10   IgE Latest Units: kU/L 134.00        ASSESSMENT AND PLAN     ENCOUNTER DIAGNOSES     ICD-10-CM   1. Asthma, severe persistent  J45.50   2. Allergic asthma  J45.909   3. OSA (obstructive sleep apnea)  G47.33  4. Ex-smoker  Z87.891   5. 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 Asthma due to environmental allergies  J45.909     Orders Placed This Encounter    benralizumab (FASENRA) 30 mg/mL Subcutaneous Syringe    predniSONE (DELTASONE) 20 mg Oral Tablet       NOHA KARASIK is a 54 y.o. male who was contacted today for follow-up of asthma.  The patient has severe persistent asthma which is not currently controlled with current regimen.  The patient is on high dose of inhaled corticosteroid with Breo 200 in addition to DuoNeb and Singulair.  Reviewed the patient the blood test which showed previously elevated level of eosinophil 200 in addition to environmental allergens specially to cats  The patient is currently on his 2nd course of steroid during this year.  I will increase the prednisone to 40 mg for the next 5 days and  continue with steroids taper  Continue with Breo and Singulair.  Continue with DuoNeb.  Continue with albuterol as needed.  The patient will benefit from biologic given the presence of eosinophilia with a level of 200 in addition to a 2 episodes of asthma attacks requiring treatment with steroids.  Will send a prescription for Fasenra   Advise the patient to avoid contact with cats.  Advise the patient to maintain a HEPA filter at home and to obtain a hypoallergenic linen  Continue CPAP therapy nightly.  Will obtain the compliance data from his DME  Continue treatment for GERD with PPI.  Will follow up the patient in 4-6 months    -Pulmonary maintenance  Immunization History   Administered Date(s) Administered    Influenza Vaccine, 6 month-adult 04/26/2018, 03/22/2019     The patient was given the opportunity to ask questions which were addressed to the best of my ability. The patient voiced understanding in regards to the treatment plan and agreed to proceed.      Gillian Scarce, MD 09/30/2019    This note may have been partially generated using MModal Fluency Direct system, and there may be some incorrect words, spellings, and punctuation that were not noted in checking the note before saving.

## 2019-09-30 NOTE — Telephone Encounter (Signed)
Prior authorization required for prescription Harrington Challenger received by [facility] on 09/30/2019.  Benefits investigation to be completed.

## 2019-10-02 ENCOUNTER — Other Ambulatory Visit (INDEPENDENT_AMBULATORY_CARE_PROVIDER_SITE_OTHER): Payer: Self-pay | Admitting: Pharmacist

## 2019-10-02 NOTE — Telephone Encounter (Signed)
Prior authorization for [facility] submitted electronically on 10/02/2019 through CoverMyMeds. Key [id number]. Waiting for response from payor.    [name] [name]  10/02/2019, 16:20

## 2019-10-04 ENCOUNTER — Other Ambulatory Visit (INDEPENDENT_AMBULATORY_CARE_PROVIDER_SITE_OTHER): Payer: Self-pay | Admitting: Pharmacist

## 2019-10-04 NOTE — Telephone Encounter (Signed)
Prior Authorization for medication Harrington Challenger has been denied on date 10/03/2019.  Payor RDT lists the following reasons for denial: Pt has not maintained compliance with ICS + LABA for the last 90 days. Last billed Breo on 03/02/19 for a 30 days supply.  Denial notice scanned into Epic and can be found in the Media tab.    Renate L Logue  10/04/2019, 09:07

## 2019-10-22 ENCOUNTER — Encounter (HOSPITAL_BASED_OUTPATIENT_CLINIC_OR_DEPARTMENT_OTHER): Payer: Self-pay

## 2019-10-22 NOTE — Nursing Note (Signed)
Called in Advair 500/50 1 puff twice daily /SH RN

## 2019-10-31 ENCOUNTER — Encounter (HOSPITAL_COMMUNITY): Payer: Self-pay | Admitting: Internal Medicine

## 2019-11-06 ENCOUNTER — Encounter (HOSPITAL_COMMUNITY): Payer: Self-pay

## 2019-11-06 ENCOUNTER — Other Ambulatory Visit: Payer: Self-pay

## 2019-11-06 ENCOUNTER — Emergency Department (HOSPITAL_COMMUNITY): Payer: Worker's Compensation

## 2019-11-06 ENCOUNTER — Emergency Department
Admission: EM | Admit: 2019-11-06 | Discharge: 2019-11-06 | Disposition: A | Discharge status: Home / Self Care | Payer: Worker's Compensation | Attending: Family | Admitting: Family

## 2019-11-06 DIAGNOSIS — S8991XA Unspecified injury of right lower leg, initial encounter: Secondary | ICD-10-CM

## 2019-11-06 DIAGNOSIS — W109XXA Fall (on) (from) unspecified stairs and steps, initial encounter: Secondary | ICD-10-CM | POA: Insufficient documentation

## 2019-11-06 DIAGNOSIS — M25561 Pain in right knee: Secondary | ICD-10-CM

## 2019-11-06 DIAGNOSIS — Z87891 Personal history of nicotine dependence: Secondary | ICD-10-CM | POA: Insufficient documentation

## 2019-11-06 NOTE — Discharge Instructions (Signed)
Please continue with rest, ice, elevation as needed  Ace wrap for support  Follow up with Orthopedics, you have been given a referral  Return to the ED with any new, worsening or concerning symptoms.

## 2019-11-06 NOTE — ED Provider Notes (Signed)
Advanced Practice Provider: Lianne Cure  Supervising Attending: Dr. Adelene Idler  Chief Complaint:  Chief Complaint   Patient presents with   . 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      History of Present Illness:  [name] [name] is a 54 y.o. male who presents via POV with a chief complaint of right knee pain. Pt states yesterday while he was at work, he was walking up the steps and fell onto his right knee. Pt states he had some pain at the time, but this resolved on its own. He reports today while he was at work, he twisted his right knee, felt a pop and caused him to fall. He states since then he has had pain with ambulating. He denies any previous injuries or surgery to right knee.     History Limitations: None     Review of Systems:  Constitutional: No fever, chills or weakness  Skin: No rashes or diaphoresis  HENT: No congestion or rhinorrhea  Eyes: No vision changes, discharge  Cardio: No chest pain, palpitations or leg swelling   Respiratory: No cough, wheezing or SOB  GI:  No nausea, vomiting, diarrhea, constipation or abdominal pain  GU:  No dysuria, hematuria, polyuria  MSK: No back pain +right knee pain   Neuro: No loss of sensation, headache, focal deficits or LOC      Allergies:  Allergies   Allergen Reactions   . Phenergan [Promethazine] Seizure     Past Medical History:  Past Medical History:   Diagnosis Date   . Asthma    . Coronary artery disease    . CPAP (continuous positive airway pressure) dependence    . Esophageal reflux    . Hypercholesterolemia    . Hypertension    . Knee injury    . MI (myocardial infarction) (CMS [place])    . Sleep apnea          Past Surgical History:  Past Surgical History:   Procedure Laterality Date   . CORONARY ARTERY ANGIOPLASTY     . HX DENTAL EXTRACTION     . HX STENTING  (ANY)  06/15/2011   . HX TONSIL AND ADENOIDECTOMY           Social History:  Social History     Tobacco Use   . Smoking status: Former Smoker     Packs/day: 2.00     Years: 16.00     Pack years: 32.00     Quit date: 04/20/1992     Years since quitting: 27.5   . Smokeless tobacco: Never Used   Substance Use Topics   . Alcohol use: No     Alcohol/week: 0.0 standard drinks     Family History:  Family Medical History:     Problem Relation (Age of Onset)    Coronary Artery Disease Mother, Father            Physical Exam:  All nurse's notes reviewed.  Filed Vitals:    11/06/19 1658   BP: (!) 135/96   Pulse: 85   Resp: 18   Temp: 37.1 C (98.8 F)   SpO2: 95%      Constitutional: NAD. A+Ox3   HENT:    Head: NC AT  Eyes: EOMI, Conjunctivae without discharge     Mouth: Mucous membranes moist.    Cardiovascular: Intact distal pulses.   Pulmonary/Chest: No respiratory distress.                 Musculoskeletal: Full ROM of the right knee. No laxity. No obvious deformity or swelling noted.     Skin: No rash, erythema, pallor or cyanosis   Psychiatric: Behavior is normal. Mood and affect congruent.     Neurological: 5/5 strength in the RLE. Alert&Ox3. No focal deficits.    Orders, Abnormal Labs and Imaging Results:  Results up to the Time the Disposition was Entered   XR KNEE RIGHT 4 OR MORE VIEWS    Narrative:     [name] [name]    PROCEDURE DESCRIPTION: XR KNEE RIGHT 4 OR MORE VIEWS    CLINICAL INDICATION: s/p injury    TECHNIQUE:  4 views of the right knee.    COMPARISON: Right knee x-ray 07/12/2007    FINDINGS:    Bone density is normal.  Joint spaces are well maintained.  No acute  fracture or dislocation is identified.  There are no osseous erosions. Mild  bony spurring is present. Soft tissues are normal. No joint effusion is  visualized.         EKG: None indicated   MDM:   ED Course as of Nov 06 1930   Wed Nov 06, 2019   1851 Xray right knee IMPRESSION:    No acute fracture or dislocation of the right knee.     Minimal degenerative change.    Ace wrap, crutches  Given referral to Orthopedics for close follow-up  Discussed return precautions     [MW]      ED Course User Index  [MW] Aldean Jewett, APRN,FNP-BC      Presents with right knee pain.    Pt was vitally stable on arrival, and throughout visit.    Imaging: Reviewed as above.    Labs: None indicated.    Results discussed with pt.    Plan: Discharge home    Pt is agreeable with plan.    Pt was given the opportunity to ask questions and denies any questions/concerns at this time.     Consults: None   Impression:   Encounter Diagnoses   Name Primary?   . Injury of right knee, initial encounter Yes   . Pain of right knee after injury      Disposition:  Discharged  Discharge: Following the above history, physical exam, and studies, the patient was deemed stable and suitable for discharge.  It was advised that the patient return to the ED if they develop new or any other concerning symptoms and follow up as directed.   The patient's family verbalized understanding of all instructions and had no further questions or concerns.    Follow Up:   Randol Kern, PA-C  [facility]  [address] [place] [address]  [phone]      As needed, If symptoms worsen    Orthoped[facility], [place]  [address]  [facility] [place] [address]    Schedule an appointment as soon as possible for a visit in 1 day      [facility] - Emergency Department  [address]  [place] [address]  [phone]    As needed, If symptoms worsen    [facility]  [address]  Dr Laurell Toluca [address] [place] [address]  [phone]        Prescriptions:   Current Discharge Medication List           Future Appointments   Date Time Provider Department Center   04/16/2020  3:30 PM Doumit, Chanetta Marshall, MD [facility]         I am scribing for, and in the presence of, Alvino Chapel, APRN, FNP-BC for services  provided on 11/06/2019.  Candice Layton, SCRIBE   [place], [place]  11/06/2019, 17:55    I personally performed the services described in this documentation, as scribed  in my presence, and it is both accurate  and complete.    Aldean Jewett, APRN,FNP-BC  Emeterio Reeve [place], APRN,FNP-BC  11/13/2019, 09:55    The co-signing faculty was physically present in the emergency department and available for consultation and did not participate in the care of this patient.      This chart may have been completed after the conclusion of this patient's care due to the time constraints of simultaneous responsibilities of direct patient care activities during the clinical shift in the emergency department.   This note was partially generated using MModal Fluency Direct system, and there may be some incorrect words, spellings, and punctuation that were not noted in checking the note before saving.

## 2019-11-14 ENCOUNTER — Ambulatory Visit: Payer: Worker's Compensation | Attending: Family | Admitting: Sports Medicine

## 2019-11-14 ENCOUNTER — Encounter (HOSPITAL_BASED_OUTPATIENT_CLINIC_OR_DEPARTMENT_OTHER): Payer: Self-pay | Admitting: Sports Medicine

## 2019-11-14 ENCOUNTER — Other Ambulatory Visit: Payer: Self-pay

## 2019-11-14 VITALS — Ht 75.0 in | Wt 270.0 lb

## 2019-11-14 DIAGNOSIS — S8991XA Unspecified injury of right lower leg, initial encounter: Secondary | ICD-10-CM

## 2019-11-14 DIAGNOSIS — M2391 Unspecified internal derangement of right knee: Secondary | ICD-10-CM

## 2019-11-14 DIAGNOSIS — X500XXA Overexertion from strenuous movement or load, initial encounter: Secondary | ICD-10-CM

## 2019-11-14 DIAGNOSIS — Z87891 Personal history of nicotine dependence: Secondary | ICD-10-CM

## 2019-11-14 NOTE — Progress Notes (Signed)
Orthopaedics & Sports Medicine      NAME:  [name] [name]  MRN:  [id number]  DOB:      DATE:   11/14/2019    CHIEF COMPLAINT:  Chief Complaint            Knee Pain rt           HPI:  [name] [name] is a 54 y.o. male who presents today for right knee pain.  He states on 11/06/19 he stepped off a ladder and felt and heard a pop inside his knee.  He had immediate pain and difficulty moving it.  He soon developed swelling of this knee.  Since then the pain has continued.  He describes it as anterior, lateral, sharp and achy, moderate in severity.  It is worse with any walking, twisting, and squatting.  He has was seen in outside facility, x-rays were taken, he was placed into a knee immobilizer.  He has been using ice, compression, and over-the-counter Tylenol with minimal improvement.  He is unable to take anti-inflammatories due to a heart history.  He reports a previous meniscus tear in his L knee for which Dr. Shirley Friar did a knee scope.    PAST MEDICAL HISTORY:  Patient Active Problem List   Diagnosis   . CAD (coronary artery disease)   . Chest pain   . Unstable angina (CMS HCC)   . Hypokalemia   . 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 COVID-19 virus infection     PAST SURGICAL HISTORY:  Past Surgical History:   Procedure Laterality Date   . Autologous Blood Conservation Setup N/A 09/10/2016    Performed by Vicente Serene, MD at [facility] OR/PROC LOCATION   . Autologous Blood Conservation Setup N/A 09/10/2016    Performed by Vicente Serene, MD at [facility] OR/PROC LOCATION   . Autologous Blood Conservation Setup N/A 09/09/2016    Performed by Randa Evens, MD at [facility]VI OR/PROC LOCATION   . Autologous Blood Conservation, Monitoring Per Hour N/A 09/10/2016    Performed by Vicente Serene, MD at [facility]VI OR/PROC LOCATION   . Autologous Blood Conservation, Monitoring Per Hour N/A 09/10/2016    Performed by Vicente Serene, MD at [facility]VI OR/PROC LOCATION   . Autologous Blood  Conservation, Monitoring Per Hour N/A 09/09/2016    Performed by Randa Evens, MD at [facility]VI OR/PROC LOCATION   . Bypass Graft Coronary Artery Mid Robotic N/A 09/09/2016    Performed by Randa Evens, MD at [facility] HVI OR/PROC LOCATION   . CORONARY ANGIOGRAPHY W/LEFT HEART CATH W/WO LVG N/A 04/27/2018    Performed by Jodi Geralds, MD at [facility] HVI OR/PROC LOCATION   . Coronary Angiography W/Left Heart Cath W/Wo Lvg N/A 09/07/2016    Performed by Jodi Geralds, MD at [facility] HVI OR/PROC LOCATION   . Coronary Angiography W/Left Heart Cath W/Wo Lvg  04/21/2015    Performed by Jodi Geralds, MD at [facility]   . Coronary Angiography W/Left Heart Cath W/Wo Lvg  03/27/2015    Performed by Jodi Geralds, MD at [facility]   . CORONARY ARTERY ANGIOPLASTY     . Coronary Artery Angioplasty - Initial Vessel N/A 03/27/2015    Performed by Jodi Geralds, MD at [facility]   . Emergency Bleeding Heart  09/10/2016    Performed by Vicente Serene, MD at [facility] HVI OR/PROC LOCATION   . Emergency Bleeding Heart  09/10/2016    Performed by Vicente Serene, MD at [facility] HVI OR/PROC LOCATION   . HX DENTAL EXTRACTION     . HX STENTING (ANY)  06/15/2011   . HX TONSIL AND ADENOIDECTOMY     . In-Hse Stby (Cpb, Trauma, Bleeding, Cs) Per Hour N/A 09/10/2016    Performed by Vicente Serene, MD at [facility] HVI OR/PROC LOCATION   . Intravascular Ultrasound N/A 03/27/2015    Performed by Jodi Geralds, MD at [facility]   . Perfusion Charge/Stby/Cardiopulmonary Bypass N/A 09/10/2016    Performed by Vicente Serene, MD at [facility] HVI OR/PROC LOCATION   . Perfusion Charge/Stby/Cardiopulmonary Bypass N/A 09/09/2016    Performed by Randa Evens, MD at [facility] HVI OR/PROC LOCATION   . STERNOTOMY MEDIASTINAL N/A 09/10/2016    Performed by Vicente Serene, MD at [facility] HVI OR/PROC LOCATION     CURRENT MEDICATIONS:   Current Outpatient Medications   Medication Sig Dispense Refill   . acetaminophen/diphenhydramine  (TYLENOL PM ORAL) Take 2 Caps by mouth Every night     . aspirin 81 mg Oral Tablet, Chewable Take 1 Tab (81 mg total) by mouth Once a day (Patient not taking: Reported on 09/30/2019) 60 Tab 2   . atorvastatin (LIPITOR) 40 mg Oral Tablet Take 2 Tabs (80 mg total) by mouth Every evening for 90 days 180 Tab 0   . benralizumab (FASENRA) 30 mg/mL Subcutaneous Syringe 1 mL (30 mg total) by Subcutaneous route Every 8 weeks 6 mL 0   . clopidogreL (PLAVIX) 75 mg Oral Tablet Take 1 tablet by mouth once daily (Patient not taking: Reported on 09/30/2019) 90 Tab 0   . Famotidine (PEPCID) 10 mg Oral Tablet Take 10 mg by mouth Twice daily     . guaiFENesin (ROBITUSSIN) 100 mg/5 mL Oral Liquid Take 10 mL (200 mg total) by mouth Every 4 hours as needed 473 mL 0   . ipratropium-albuterol 0.5 mg-3 mg(2.5 mg base)/3 mL Solution for Nebulization 3 mL by Nebulization route Four times a day     . metoprolol succinate (TOPROL-XL) 100 mg Oral Tablet Sustained Release 24 hr Take 100 mg by mouth Once a day (Patient not taking: Reported on 09/30/2019)     . montelukast (SINGULAIR) 10 mg Oral Tablet Take 10 mg by mouth Every evening     . nitroglycerin (NITROSTAT) 0.4 mg Sublingual Tablet, Sublingual 0.4 mg by Sublingual route Every 5 minutes as needed for Chest pain for 3 doses over 15 minutes (Patient not taking: Reported on 09/30/2019)     . pantoprazole (PROTONIX) 20 mg Oral Tablet, Delayed Release (E.C.) Take 20 mg by mouth Every morning before breakfast     . predniSONE (DELTASONE) 10 mg Oral Tablet Take 10 mg by mouth Once a day     . traMADoL (ULTRAM) 50 mg Oral Tablet Take by mouth Every 6 hours as needed for Pain (Patient not taking: Reported on 09/30/2019)     . VENTOLIN HFA 90 mcg/actuation Inhalation HFA Aerosol Inhaler      . VITAMIN D 1,250 mcg (50,000 unit) Oral Capsule Take 50,000 Units by mouth Every 7 days       No current facility-administered medications for this visit.     ALLERGIES:  Phenergan [promethazine]  SOCIAL HISTORY:    Social History     Socioeconomic History   . Marital status: Married     Spouse name: Not on file   . Number of children: Not on file   . Years  of education: Not on file   . Highest education level: Not on file   Tobacco Use   . Smoking status: Former Smoker     Packs/day: 2.00     Years: 16.00     Pack years: 32.00     Quit date: 04/20/1992     Years since quitting: 27.5   . Smokeless tobacco: Never Used   Substance and Sexual Activity   . Alcohol use: No     Alcohol/week: 0.0 standard drinks   . Drug use: No   Other Topics Concern     Social Determinants of Health     Financial Resource Strain:    . Difficulty of Paying Living Expenses:    Food Insecurity:    . Worried About Programme researcher, broadcasting/film/video in the Last Year:    . Barista in the Last Year:    Transportation Needs:    . Freight forwarder (Medical):    Marland Kitchen Lack of Transportation (Non-Medical):    Physical Activity:    . Days of Exercise per Week:    . Minutes of Exercise per Session:    Stress:    . Feeling of Stress :    Intimate Partner Violence:    . Fear of Current or Ex-Partner:    . Emotionally Abused:    Marland Kitchen Physically Abused:    . Sexually Abused:      FAMILY HISTORY:  Family Medical History:     Problem Relation (Age of Onset)    Coronary Artery Disease Mother, Father              REVIEW OF SYSTEMS:    Constitutional: NEGATIVE for fever, chills, change in weight  Integumentary/ Skin: NEGATIVE for worrisome rashes, moles, or lesions  Musculoskeletal: As per HPI above  Neuro: NEGATIVE for weakness, dizziness, or paresthesias  All other systems reviewed and negative.      PHYSICAL EXAM:    Vital signs: Ht 1.905 m (6\' 3" )   Wt 122 kg (270 lb)   BMI 33.75 kg/m   General: Normal appearance, no obvious distress, alert and oriented to person, place, and time  HEENT: NC/AT, no scleral icterus  CV: no LE edema  Pulm: Breathing non labored, normal respiratory pattern  Abdomen: Soft, non obese  Neuro: No focal neuro deficit, normal coordination, normal  muscle tone  Skin: Warm and dry, no ecchymosis, erythema, warmth, or induration, no obvious rash  Psych: Appropriate, normal mood and affect    Musculoskeletal:   RIght Knee  - stance: normal gait without limp, no obvious leg length discrepancy  - inspection: mild effusion, no ecchymosis, normal muscle tone, no obvious deformity  - palpation: lateral and edial joint line tenderness, patella and patellar tendon non-tender  - ROM: 0 degrees extension, 120 degrees flexion, + pain with hyper flexion and extension, normal hip ROM without reproduction of pain  - strength: 5/5 in flexion, 5/5 in extension  - normal motor, vascular, and sensory exam distally  - special tests:  (-) Lachman  (-) anterior drawer  (-) posterior drawer  (+) McMurray   (-) varus stress  (+) valgus stress, good end point but inc pain  (+) Patellar grind  (-) patellar apprehension      IMAGING:     X-rays of the patient's right knee from 11/06/19 were personally reviewed today by myself in the PACS system along with the radiology report.  Images reveal mild DJD, no acute fracture.  ASSESSMENT/ PLAN:    Desjuan was seen today for knee pain.    Diagnoses and all orders for this visit:    Injury of right knee, initial encounter  Internal derangement of right knee  -discussed treatment options including oral medications, injections, PT  -will order MRI to further eval meniscus  -d/c knee immobilizer, fitted for functional knee brace  -continue icing, NSAIDs/ Tylenol prn  -     DME KNEE BRACE(REQUISITION/REQUEST); Future  -     MRI KNEE RIGHT WO CONTRAST; Future         Return for MRI review.     This note was partially generated using MModal Fluency Direct system, and there may be some incorrect words, spellings, and punctuation that were not noted in checking the note before saving.    [facility]

## 2019-12-03 ENCOUNTER — Telehealth (HOSPITAL_BASED_OUTPATIENT_CLINIC_OR_DEPARTMENT_OTHER): Payer: Self-pay | Admitting: Sports Medicine

## 2019-12-03 NOTE — Telephone Encounter (Addendum)
-----   Message from Festus Holts sent at 12/03/2019 12:41 PM EDT -----  Regarding: MRI follow up  Contact: [phone]  Melissa workers comp Case manager for the patient is calling in to schedule an MRI follow up for next week. Please call the case manager not the patient at her request [phone] to schedule  Thanks    Returned Melissa's call and scheduled the pt for an MRI f/u on 12/19/19 @ 9 Am. Melissa agreed to this appointment date and time for the pt.    Vanna Scotland, MA  12/03/2019, 12:59

## 2019-12-19 ENCOUNTER — Ambulatory Visit: Payer: Worker's Compensation | Attending: Sports Medicine | Admitting: Sports Medicine

## 2019-12-19 ENCOUNTER — Other Ambulatory Visit: Payer: Self-pay

## 2019-12-19 ENCOUNTER — Encounter (HOSPITAL_BASED_OUTPATIENT_CLINIC_OR_DEPARTMENT_OTHER): Payer: Self-pay | Admitting: Sports Medicine

## 2019-12-19 VITALS — Ht 75.0 in | Wt 270.0 lb

## 2019-12-19 DIAGNOSIS — S83281A Other tear of lateral meniscus, current injury, right knee, initial encounter: Secondary | ICD-10-CM | POA: Insufficient documentation

## 2019-12-19 DIAGNOSIS — S8991XD Unspecified injury of right lower leg, subsequent encounter: Secondary | ICD-10-CM | POA: Insufficient documentation

## 2019-12-19 NOTE — Progress Notes (Signed)
Orthopaedics & Sports Medicine      NAME:  [name] [name]  MRN:  [id number]  DOB:      DATE:   12/19/2019    CHIEF COMPLAINT:  Chief Complaint            MRI Results MRI f/u right knee injury W/C ( 12/05/19)          HPI:  [name] [name] is a 54 y.o. male who presents today for f/u right knee pain.  His initial injury was on 5/19/21when he stepped off a ladder and felt and heard a pop inside his knee.  He had immediate pain and difficulty moving it.  He soon developed swelling of this knee.  Since then the pain has continued.  He describes it as anterior, lateral, sharp and achy, moderate in severity.  It is worse with any walking, twisting, and squatting.  He has was seen in outside facility, x-rays were taken, he was placed into a knee immobilizer.  He has been using ice, compression, and over-the-counter Tylenol with minimal improvement.  He is unable to take anti-inflammatories due to a heart history.  He reports a previous meniscus tear in his L knee for which Dr. Shirley Friar did a knee scope.  He comes today with an MRI for review.    PAST MEDICAL HISTORY:  Patient Active Problem List   Diagnosis   . CAD (coronary artery disease)   . Chest pain   . Unstable angina (CMS HCC)   . Hypokalemia   . 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 COVID-19 virus infection     PAST SURGICAL HISTORY:  Past Surgical History:   Procedure Laterality Date   . Autologous Blood Conservation Setup N/A 09/10/2016    Performed by Vicente Serene, MD at [facility]VI OR/PROC LOCATION   . Autologous Blood Conservation Setup N/A 09/10/2016    Performed by Vicente Serene, MD at [facility]VI OR/PROC LOCATION   . Autologous Blood Conservation Setup N/A 09/09/2016    Performed by Randa Evens, MD at [facility] HVI OR/PROC LOCATION   . Autologous Blood Conservation, Monitoring Per Hour N/A 09/10/2016    Performed by Vicente Serene, MD at [facility] HVI OR/PROC LOCATION   . Autologous Blood Conservation, Monitoring Per Hour  N/A 09/10/2016    Performed by Vicente Serene, MD at [facility] HVI OR/PROC LOCATION   . Autologous Blood Conservation, Monitoring Per Hour N/A 09/09/2016    Performed by Randa Evens, MD at [facility] HVI OR/PROC LOCATION   . Bypass Graft Coronary Artery Mid Robotic N/A 09/09/2016    Performed by Randa Evens, MD at [facility] HVI OR/PROC LOCATION   . CORONARY ANGIOGRAPHY W/LEFT HEART CATH W/WO LVG N/A 04/27/2018    Performed by Jodi Geralds, MD at [facility] HVI OR/PROC LOCATION   . Coronary Angiography W/Left Heart Cath W/Wo Lvg N/A 09/07/2016    Performed by Jodi Geralds, MD at [facility] HVI OR/PROC LOCATION   . Coronary Angiography W/Left Heart Cath W/Wo Lvg  04/21/2015    Performed by Jodi Geralds, MD at [facility]   . Coronary Angiography W/Left Heart Cath W/Wo Lvg  03/27/2015    Performed by Jodi Geralds, MD at [facility]   . CORONARY ARTERY ANGIOPLASTY     . Coronary Artery Angioplasty - Initial Vessel N/A 03/27/2015    Performed by Jodi Geralds, MD at [facility]   . Emergency Bleeding Heart  09/10/2016  Performed by Vicente Serene, MD at [facility] HVI OR/PROC LOCATION   . Emergency Bleeding Heart  09/10/2016    Performed by Vicente Serene, MD at [facility] HVI OR/PROC LOCATION   . HX DENTAL EXTRACTION     . HX STENTING (ANY)  06/15/2011   . HX TONSIL AND ADENOIDECTOMY     . In-Hse Stby (Cpb, Trauma, Bleeding, Cs) Per Hour N/A 09/10/2016    Performed by Vicente Serene, MD at [facility] HVI OR/PROC LOCATION   . Intravascular Ultrasound N/A 03/27/2015    Performed by Jodi Geralds, MD at [facility]   . Perfusion Charge/Stby/Cardiopulmonary Bypass N/A 09/10/2016    Performed by Vicente Serene, MD at [facility] HVI OR/PROC LOCATION   . Perfusion Charge/Stby/Cardiopulmonary Bypass N/A 09/09/2016    Performed by Randa Evens, MD at [facility] HVI OR/PROC LOCATION   . STERNOTOMY MEDIASTINAL N/A 09/10/2016    Performed by Vicente Serene, MD at [facility] HVI OR/PROC LOCATION     CURRENT MEDICATIONS:    Current Outpatient Medications   Medication Sig Dispense Refill   . acetaminophen/diphenhydramine (TYLENOL PM ORAL) Take 2 Caps by mouth Every night     . aspirin 81 mg Oral Tablet, Chewable Take 1 Tab (81 mg total) by mouth Once a day (Patient not taking: Reported on 09/30/2019) 60 Tab 2   . atorvastatin (LIPITOR) 40 mg Oral Tablet Take 2 Tabs (80 mg total) by mouth Every evening for 90 days 180 Tab 0   . benralizumab (FASENRA) 30 mg/mL Subcutaneous Syringe 1 mL (30 mg total) by Subcutaneous route Every 8 weeks 6 mL 0   . clopidogreL (PLAVIX) 75 mg Oral Tablet Take 1 tablet by mouth once daily (Patient not taking: Reported on 09/30/2019) 90 Tab 0   . Famotidine (PEPCID) 10 mg Oral Tablet Take 10 mg by mouth Twice daily     . fluticasone propion-salmeteroL (ADVAIR) 500-50 mcg/dose Inhalation Disk with Device oral diskus inhaler Take 1 INHALATION by inhalation Twice daily     . guaiFENesin (ROBITUSSIN) 100 mg/5 mL Oral Liquid Take 10 mL (200 mg total) by mouth Every 4 hours as needed 473 mL 0   . ipratropium-albuterol 0.5 mg-3 mg(2.5 mg base)/3 mL Solution for Nebulization 3 mL by Nebulization route Four times a day     . metoprolol succinate (TOPROL-XL) 100 mg Oral Tablet Sustained Release 24 hr Take 100 mg by mouth Once a day (Patient not taking: Reported on 09/30/2019)     . montelukast (SINGULAIR) 10 mg Oral Tablet Take 10 mg by mouth Every evening     . nitroglycerin (NITROSTAT) 0.4 mg Sublingual Tablet, Sublingual 0.4 mg by Sublingual route Every 5 minutes as needed for Chest pain for 3 doses over 15 minutes (Patient not taking: Reported on 09/30/2019)     . pantoprazole (PROTONIX) 20 mg Oral Tablet, Delayed Release (E.C.) Take 20 mg by mouth Every morning before breakfast     . predniSONE (DELTASONE) 10 mg Oral Tablet Take 10 mg by mouth Once a day     . traMADoL (ULTRAM) 50 mg Oral Tablet Take by mouth Every 6 hours as needed for Pain (Patient not taking: Reported on 09/30/2019)     . VENTOLIN HFA 90 mcg/actuation  Inhalation HFA Aerosol Inhaler      . VITAMIN D 1,250 mcg (50,000 unit) Oral Capsule Take 50,000 Units by mouth Every 7 days       No current facility-administered medications for this visit.     ALLERGIES:  Phenergan [promethazine]  SOCIAL HISTORY:   Social History     Socioeconomic History   . Marital status: Married     Spouse name: Not on file   . Number of children: Not on file   . Years of education: Not on file   . Highest education level: Not on file   Tobacco Use   . Smoking status: Former Smoker     Packs/day: 2.00     Years: 16.00     Pack years: 32.00     Quit date: 04/20/1992     Years since quitting: 27.6   . Smokeless tobacco: Never Used   Substance and Sexual Activity   . Alcohol use: No     Alcohol/week: 0.0 standard drinks   . Drug use: No   Other Topics Concern     Social Determinants of Health     Financial Resource Strain:    . Difficulty of Paying Living Expenses:    Food Insecurity:    . Worried About Programme researcher, broadcasting/film/video in the Last Year:    . Barista in the Last Year:    Transportation Needs:    . Freight forwarder (Medical):    Marland Kitchen Lack of Transportation (Non-Medical):    Physical Activity:    . Days of Exercise per Week:    . Minutes of Exercise per Session:    Stress:    . Feeling of Stress :    Intimate Partner Violence:    . Fear of Current or Ex-Partner:    . Emotionally Abused:    Marland Kitchen Physically Abused:    . Sexually Abused:      FAMILY HISTORY:  Family Medical History:     Problem Relation (Age of Onset)    Coronary Artery Disease Mother, Father              REVIEW OF SYSTEMS:    Constitutional: NEGATIVE for fever, chills, change in weight  Integumentary/ Skin: NEGATIVE for worrisome rashes, moles, or lesions  Musculoskeletal: As per HPI above  Neuro: NEGATIVE for weakness, dizziness, or paresthesias  All other systems reviewed and negative.      PHYSICAL EXAM:    Vital signs: Ht 1.905 m (6\' 3" )   Wt 122 kg (270 lb)   BMI 33.75 kg/m   General: Normal appearance, no obvious  distress, alert and oriented to person, place, and time  HEENT: NC/AT, no scleral icterus  CV: no LE edema  Pulm: Breathing non labored, normal respiratory pattern  Abdomen: Soft, non obese  Neuro: No focal neuro deficit, normal coordination, normal muscle tone  Skin: Warm and dry, no ecchymosis, erythema, warmth, or induration, no obvious rash  Psych: Appropriate, normal mood and affect    Musculoskeletal:   RIght Knee  - stance: normal gait without limp, no obvious leg length discrepancy  - inspection: mild effusion, no ecchymosis, normal muscle tone, no obvious deformity  - palpation: lateral and edial joint line tenderness, patella and patellar tendon non-tender  - ROM: 0 degrees extension, 120 degrees flexion, + pain with hyper flexion and extension, normal hip ROM without reproduction of pain  - strength: 5/5 in flexion, 5/5 in extension  - normal motor, vascular, and sensory exam distally  - special tests:  (-) Lachman  (-) anterior drawer  (-) posterior drawer  (+) McMurray   (-) varus stress  (+) valgus stress, good end point but inc pain  (+) Patellar grind  (-) patellar apprehension  IMAGING:     X-rays of the patient's right knee from 11/06/19 were personally reviewed today by myself in the PACS system along with the radiology report.  Images reveal mild DJD, no acute fracture.     MRI of the patient's right knee from 12/05/19 was personally reviewed today by myself in the PACS system along with the radiology report.  Images reveal mild DJD, lateral meniscus tear.      ASSESSMENT/ PLAN:    [name] [name] was seen today for mri results.    Diagnoses and all orders for this visit:    Other tear of lateral meniscus of right knee as current injury, initial encounter  Injury of right knee, subsequent encounter  -discussed treatment options including both surgical and non surgical tx options  -pt wishes to proceed with surgery, eill refer to Dr. Garnette CzechFazlare to discuss options moving forward  -functional knee brace   -continue icing, NSAIDs/ Tylenol prn  -     MRI KNEE RIGHT WO CONTRAST  -     DME KNEE BRACE(REQUISITION/REQUEST); Future     I am happy to see him back at any time as needed.  Return if symptoms worsen or fail to improve.     This note was partially generated using MModal Fluency Direct system, and there may be some incorrect words, spellings, and punctuation that were not noted in checking the note before saving.    Aram BeechamAshley Deauna Yaw DO  Primary Care Sports Medicine    [facility]  [facility]

## 2019-12-26 ENCOUNTER — Ambulatory Visit: Payer: Worker's Compensation | Attending: Sports Medicine | Admitting: Sports Medicine

## 2019-12-26 ENCOUNTER — Other Ambulatory Visit: Payer: Self-pay

## 2019-12-26 ENCOUNTER — Encounter (HOSPITAL_BASED_OUTPATIENT_CLINIC_OR_DEPARTMENT_OTHER): Payer: Self-pay | Admitting: Sports Medicine

## 2019-12-26 VITALS — Ht 75.0 in | Wt 270.0 lb

## 2019-12-26 DIAGNOSIS — Z01818 Encounter for other preprocedural examination: Secondary | ICD-10-CM | POA: Insufficient documentation

## 2019-12-26 DIAGNOSIS — S83281A Other tear of lateral meniscus, current injury, right knee, initial encounter: Secondary | ICD-10-CM

## 2019-12-26 DIAGNOSIS — S83241S Other tear of medial meniscus, current injury, right knee, sequela: Secondary | ICD-10-CM | POA: Insufficient documentation

## 2019-12-26 NOTE — Progress Notes (Cosign Needed)
ORTHOPEDICS, MEDICAL OFFICE BUILDING  [address]  [facility] [place] [address]      NAME: [name] [name]  DATE: 12/26/2019  DOB:   MRN: [id number]      CHIEF COMPLAINT   Knee Pain (right knee injury )    HPI:  [name] [name] is a 54 y.o. male who presents today for right knee pain.  He states that he was stepping off of a ladder when he heard a pop.  He states this injury occurred on Nov 06, 2018. He does have a history of surgery on his meniscus that was performed approximately 5 years ago.  He states he is still having ongoing pain.  He describes his pain as intermittent sharp that he rates a 2/10 in clinic today.  He is experiencing buckling, clicking, locking catching.  He has complaints of mild numbness and tingling down his right leg into his foot.  His pain is increased with turning.  He is currently wearing a brace, applying ice and taking over-the-counter Tylenol for pain control.  He was previously seen and evaluated at [facility] and referred today for possible meniscus tear.  He has no other complaints of pain or discomfort in clinic today.  He denies any neurovascular symptoms or calf pain.    PAST MEDICAL HISTORY:  Patient Active Problem List   Diagnosis   . CAD (coronary artery disease)   . Chest pain   . Unstable angina (CMS HCC)   . Hypokalemia   . 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 COVID-19 virus infection    Reviewed  PAST SURGICAL HISTORY:  Past Surgical History:   Procedure Laterality Date   . CORONARY ARTERY ANGIOPLASTY     . HX DENTAL EXTRACTION     . HX STENTING (ANY)  06/15/2011   . HX TONSIL AND ADENOIDECTOMY          Reviewed   CURRENT MEDICATIONS:   Current Outpatient Medications   Medication Sig Dispense Refill   . acetaminophen/diphenhydramine (TYLENOL PM ORAL) Take 2 Caps by mouth Every night     . aspirin 81 mg Oral Tablet, Chewable Take 1 Tab (81 mg total) by mouth Once a day (Patient not taking: Reported on  09/30/2019) 60 Tab 2   . atorvastatin (LIPITOR) 40 mg Oral Tablet Take 2 Tabs (80 mg total) by mouth Every evening for 90 days 180 Tab 0   . benralizumab (FASENRA) 30 mg/mL Subcutaneous Syringe 1 mL (30 mg total) by Subcutaneous route Every 8 weeks 6 mL 0   . clopidogreL (PLAVIX) 75 mg Oral Tablet Take 1 tablet by mouth once daily (Patient not taking: Reported on 09/30/2019) 90 Tab 0   . Famotidine (PEPCID) 10 mg Oral Tablet Take 10 mg by mouth Twice daily     . fluticasone propion-salmeteroL (ADVAIR) 500-50 mcg/dose Inhalation Disk with Device oral diskus inhaler Take 1 INHALATION by inhalation Twice daily     . guaiFENesin (ROBITUSSIN) 100 mg/5 mL Oral Liquid Take 10 mL (200 mg total) by mouth Every 4 hours as needed (Patient not taking: Reported on 12/26/2019) 473 mL 0   . ipratropium-albuterol 0.5 mg-3 mg(2.5 mg base)/3 mL Solution for Nebulization 3 mL by Nebulization route Four times a day     . metoprolol succinate (TOPROL-XL) 100 mg Oral Tablet Sustained Release 24 hr Take 100 mg by mouth Once a day (Patient not taking: Reported on 09/30/2019)     .  montelukast (SINGULAIR) 10 mg Oral Tablet Take 10 mg by mouth Every evening     . nitroglycerin (NITROSTAT) 0.4 mg Sublingual Tablet, Sublingual 0.4 mg by Sublingual route Every 5 minutes as needed for Chest pain for 3 doses over 15 minutes (Patient not taking: Reported on 09/30/2019)     . pantoprazole (PROTONIX) 20 mg Oral Tablet, Delayed Release (E.C.) Take 20 mg by mouth Every morning before breakfast     . predniSONE (DELTASONE) 10 mg Oral Tablet Take 10 mg by mouth Once a day (Patient not taking: Reported on 12/26/2019)     . traMADoL (ULTRAM) 50 mg Oral Tablet Take by mouth Every 6 hours as needed for Pain (Patient not taking: Reported on 09/30/2019)     . VENTOLIN HFA 90 mcg/actuation Inhalation HFA Aerosol Inhaler      . VITAMIN D 1,250 mcg (50,000 unit) Oral Capsule Take 50,000 Units by mouth Every 7 days       No current facility-administered medications for  this visit.    Reviewed   ALLERGIES:  Phenergan [promethazine] Reviewed   SOCIAL HISTORY:   Social History     Socioeconomic History   . Marital status: Married     Spouse name: Not on file   . Number of children: Not on file   . Years of education: Not on file   . Highest education level: Not on file   Tobacco Use   . Smoking status: Former Smoker     Packs/day: 2.00     Years: 16.00     Pack years: 32.00     Quit date: 04/20/1992     Years since quitting: 27.7   . Smokeless tobacco: Never Used   Substance and Sexual Activity   . Alcohol use: No     Alcohol/week: 0.0 standard drinks   . Drug use: No   Other Topics Concern     Social Determinants of Health     Financial Resource Strain:    . Difficulty of Paying Living Expenses:    Food Insecurity:    . Worried About Programme researcher, broadcasting/film/video in the Last Year:    . Barista in the Last Year:    Transportation Needs:    . Freight forwarder (Medical):    Marland Kitchen Lack of Transportation (Non-Medical):    Physical Activity:    . Days of Exercise per Week:    . Minutes of Exercise per Session:    Stress:    . Feeling of Stress :    Intimate Partner Violence:    . Fear of Current or Ex-Partner:    . Emotionally Abused:    Marland Kitchen Physically Abused:    . Sexually Abused:     Reviewed   FAMILY HISTORY:  Family Medical History:     Problem Relation (Age of Onset)    Coronary Artery Disease Mother, Father           Reviewed     ROS  Right knee pain - all others were reviewed and negative    PHYSICAL EXAM  Ht 1.905 m (6\' 3" )   Wt 122 kg (270 lb)   BMI 33.75 kg/m             GENERAL EXAM    Constitutional: He is oriented to person, place, and time. He appears well-developed and well-nourished.   HENT:   Head: Normocephalic and atraumatic.   Eyes: Conjunctivae are normal. Pupils are equal, round.  Neck: Normal range of motion.   Cardiovascular: Normal rate.    Pulmonary/Chest: Effort normal.   Abdominal: Non-protuberant   Neurological: He is alert and oriented to person, place, and  time.   Skin: Skin is warm and dry.   Psychiatric: He has a normal mood and affect. His behavior is normal. Judgment and thought content normal.     Nursing note and vitals reviewed.    ORTHO EXAM   EXAM RIGHT KNEE    ROM 0-135  Lateral joint tenderness to palpation.  Pain with hyperflexion and extension.  Positive pain with Mcmurray's testing.  ACL/PCL/LCL/MCL are stable.  Mild Effusion.  Patella tracks nicely without apprehension.  Quad has good tone.  Skin is normal.  Calf is soft and supple.  Neurovascular exam is normal.  Passive ROM of hip is pain free.      IMAGING     PROCEDURE DESCRIPTION: XR KNEE RIGHT 4 OR MORE VIEWS    CLINICAL INDICATION: s/p injury    TECHNIQUE:  4 views of the right knee.    COMPARISON: Right knee x-ray 07/12/2007    FINDINGS:    Bone density is normal.  Joint spaces are well maintained.  No acute  fracture or dislocation is identified.  There are no osseous erosions. Mild  bony spurring is present. Soft tissues are normal. No joint effusion is  visualized.    IMPRESSION:    No acute fracture or dislocation of the right knee.    Minimal degenerative change.       MRI imaging reviewed in office today reveals a lateral meniscus tear of the right knee.      ASSESSMENT      ICD-10-CM    1. Tear of medial meniscus of right knee, current, unspecified tear type, sequela  S83.241S ECG 12 LEAD - ADULT     XR CHEST AP AND LATERAL     CBC/DIFF     COMPREHENSIVE METABOLIC PANEL, NON-FASTING     [facility] SCHEDULED SURGICAL PROCEDURES   2. Preop testing  Z01.818 ECG 12 LEAD - ADULT     XR CHEST AP AND LATERAL     CBC/DIFF     COMPREHENSIVE METABOLIC PANEL, NON-FASTING     [facility]ITAL SCHEDULED SURGICAL PROCEDURES   3. Tear of lateral meniscus of right knee  S83.281A            PLAN   The plan for Kalan is to perform a right knee arthroscopy with partial lateral meniscectomy. A date was selected in clinic today for January 20, 2020. The risks, benefits, details and non-operative alternatives  were discussed with the patient. They agreed to proceed and gave informed written consent. Risks of the surgery include but are not limited to the following: cardiac arrest, death, infection, bleeding, stiffness, injury to vital structures (nerves, vessels, ligaments, tendons) permanent, disability, loss of limb, failure of surgery, deep vein thrombosis, or motor or sensory deficits.   I plan to see the patient back 2 weeks postoperatively for re-evaluation to monitor progression and healing.  As of now, I have advised the patient to continue treating the pain conservatively with the use of ice and over-the-counter medications. The patient states understanding and is in agreement with this plan.      Orders Placed This Encounter   . [facility]OSPITAL SCHEDULED SURGICAL PROCEDURES   . XR CHEST AP AND LATERAL   . CBC/DIFF   . COMPREHENSIVE METABOLIC PANEL, NON-FASTING   . ECG 12 LEAD -  ADULT       The patient was advised to call with any questions, concerns or worsening symptoms.    This note was partially generated using MModal Fluency Direct system, and there may be some incorrect words, spellings, and punctuation that were not noted in checking the note before saving.    Annice PihMegan Hakop Humbarger, MA  I am scribing for, and in the presence of, Dr. Shirley FriarFazalare for services provided on 12/26/2019.  Annice PihMegan Husain Costabile, MA       Note was read.  Assessment and plan is correct.  Any changes were noted   JJF

## 2019-12-31 ENCOUNTER — Encounter (HOSPITAL_BASED_OUTPATIENT_CLINIC_OR_DEPARTMENT_OTHER): Payer: Self-pay | Admitting: Sports Medicine

## 2020-01-13 ENCOUNTER — Other Ambulatory Visit (HOSPITAL_COMMUNITY): Payer: Self-pay | Admitting: Sports Medicine

## 2020-01-13 DIAGNOSIS — Z01818 Encounter for other preprocedural examination: Secondary | ICD-10-CM

## 2020-01-15 ENCOUNTER — Encounter (HOSPITAL_COMMUNITY): Payer: Self-pay | Admitting: Sports Medicine

## 2020-01-15 NOTE — Nursing Note (Signed)
COVID-19 Admission Screen    Low Risk:   Able to Provide asymptomatic History  No Sick Contacts  No New Household Conatcts    Moderate/High Risk: {None    Test Result:Pending    7-30 or 7-31

## 2020-01-16 ENCOUNTER — Encounter (HOSPITAL_BASED_OUTPATIENT_CLINIC_OR_DEPARTMENT_OTHER): Payer: Self-pay | Admitting: Sports Medicine

## 2020-01-17 ENCOUNTER — Telehealth (HOSPITAL_BASED_OUTPATIENT_CLINIC_OR_DEPARTMENT_OTHER): Payer: Self-pay | Admitting: Sports Medicine

## 2020-01-17 ENCOUNTER — Ambulatory Visit (HOSPITAL_COMMUNITY): Payer: Worker's Compensation

## 2020-01-17 ENCOUNTER — Ambulatory Visit
Admission: RE | Admit: 2020-01-17 | Discharge: 2020-01-17 | Disposition: A | Discharge status: Home / Self Care | Payer: Worker's Compensation | Source: Ambulatory Visit | Attending: Sports Medicine | Admitting: Sports Medicine

## 2020-01-17 ENCOUNTER — Other Ambulatory Visit: Payer: Self-pay

## 2020-01-17 ENCOUNTER — Ambulatory Visit (HOSPITAL_COMMUNITY)
Admission: RE | Admit: 2020-01-17 | Discharge: 2020-01-17 | Disposition: A | Discharge status: Home / Self Care | Payer: Worker's Compensation | Source: Ambulatory Visit | Attending: Sports Medicine | Admitting: Sports Medicine

## 2020-01-17 DIAGNOSIS — R9431 Abnormal electrocardiogram [ECG] [EKG]: Secondary | ICD-10-CM | POA: Insufficient documentation

## 2020-01-17 DIAGNOSIS — R918 Other nonspecific abnormal finding of lung field: Secondary | ICD-10-CM | POA: Insufficient documentation

## 2020-01-17 DIAGNOSIS — Z01810 Encounter for preprocedural cardiovascular examination: Secondary | ICD-10-CM | POA: Insufficient documentation

## 2020-01-17 DIAGNOSIS — Z01818 Encounter for other preprocedural examination: Secondary | ICD-10-CM

## 2020-01-17 DIAGNOSIS — S83241S Other tear of medial meniscus, current injury, right knee, sequela: Secondary | ICD-10-CM

## 2020-01-17 DIAGNOSIS — Z01812 Encounter for preprocedural laboratory examination: Secondary | ICD-10-CM | POA: Insufficient documentation

## 2020-01-17 DIAGNOSIS — Z01811 Encounter for preprocedural respiratory examination: Secondary | ICD-10-CM | POA: Insufficient documentation

## 2020-01-17 DIAGNOSIS — Z20822 Contact with and (suspected) exposure to covid-19: Secondary | ICD-10-CM | POA: Insufficient documentation

## 2020-01-17 LAB — CBC WITH DIFF
BASOPHIL #: 0.1 10*3/uL (ref 0.00–0.20)
BASOPHIL %: 1 %
EOSINOPHIL #: 0.1 10*3/uL (ref 0.00–0.50)
EOSINOPHIL %: 1 %
HCT: 49.2 % (ref 38.9–50.5)
HGB: 16.5 g/dL (ref 13.4–17.3)
LYMPHOCYTE #: 2.4 10*3/uL (ref 0.80–3.20)
LYMPHOCYTE %: 29 %
MCH: 29.3 pg (ref 27.9–33.1)
MCHC: 33.6 g/dL (ref 32.8–36.0)
MCV: 87.4 fL (ref 82.4–95.0)
MONOCYTE #: 0.9 10*3/uL — ABNORMAL HIGH (ref 0.20–0.80)
MONOCYTE %: 10 %
MPV: 9 fL (ref 6.0–10.2)
NEUTROPHIL #: 4.9 10*3/uL (ref 1.60–5.50)
NEUTROPHIL %: 59 %
PLATELETS: 247 10*3/uL (ref 140–440)
RBC: 5.63 10*6/uL (ref 4.40–5.68)
RDW: 13.2 % (ref 10.9–15.1)
WBC: 8.3 10*3/uL (ref 3.3–9.3)

## 2020-01-17 LAB — COMPREHENSIVE METABOLIC PANEL, NON-FASTING
ALBUMIN: 4.3 g/dL (ref 3.2–4.6)
ALKALINE PHOSPHATASE: 85 U/L (ref 20–130)
ALT (SGPT): 19 U/L (ref <=52)
ANION GAP: 9 mmol/L
AST (SGOT): 14 U/L (ref <=35)
BILIRUBIN TOTAL: 0.8 mg/dL (ref 0.3–1.2)
BUN/CREA RATIO: 13
BUN: 14 mg/dL (ref 10–25)
CALCIUM: 8.9 mg/dL (ref 8.8–10.3)
CHLORIDE: 105 mmol/L (ref 98–111)
CO2 TOTAL: 24 mmol/L (ref 21–35)
CREATININE: 1.07 mg/dL (ref <=1.30)
ESTIMATED GFR: >60 mL/min/{1.73_m2}
GLUCOSE: 116 mg/dL — ABNORMAL HIGH (ref 70–110)
POTASSIUM: 4.2 mmol/L (ref 3.5–5.0)
PROTEIN TOTAL: 6.3 g/dL (ref 6.0–8.3)
SODIUM: 138 mmol/L (ref 135–145)

## 2020-01-17 LAB — ECG 12 LEAD - ADULT
Calculated P Axis: 57 deg
Calculated T Axis: 16 deg
Heart Rate: 96 {beats}/min
I 40 Axis: 6 deg
PR Interval: 168 ms
QRS Axis: 125 deg
QRS Duration: 96 ms
QT Interval: 345 ms
QTC Calculation: 436 ms
ST Axis: 13 deg
T 40 Axis: 138 deg

## 2020-01-17 NOTE — Telephone Encounter (Signed)
Called pt with surgery time. No answer, left VM advising patient to be at the hospital on Monday no later than 11:30am. I advised that he does not eat or drink anything after midnight the night before. Advised him to call back with any additional questions. Annice Pih, [place]  01/17/2020, 16:00

## 2020-01-18 LAB — COVID-19 ~~LOC~~ MOLECULAR LAB TESTING: SARS-COV-2: NOT DETECTED

## 2020-01-20 ENCOUNTER — Inpatient Hospital Stay
Admission: RE | Admit: 2020-01-20 | Discharge: 2020-01-20 | Disposition: A | Discharge status: Home / Self Care | Payer: Worker's Compensation | Source: Ambulatory Visit | Attending: Sports Medicine | Admitting: Sports Medicine

## 2020-01-20 ENCOUNTER — Encounter (HOSPITAL_COMMUNITY): Payer: Self-pay | Admitting: Sports Medicine

## 2020-01-20 ENCOUNTER — Ambulatory Visit (HOSPITAL_BASED_OUTPATIENT_CLINIC_OR_DEPARTMENT_OTHER): Payer: Worker's Compensation | Admitting: Anesthesiology

## 2020-01-20 ENCOUNTER — Ambulatory Visit (HOSPITAL_COMMUNITY): Payer: Worker's Compensation | Admitting: Anesthesiology

## 2020-01-20 ENCOUNTER — Other Ambulatory Visit: Payer: Self-pay

## 2020-01-20 ENCOUNTER — Encounter (HOSPITAL_COMMUNITY)
Admission: RE | Disposition: A | Discharge status: Home / Self Care | Payer: Self-pay | Source: Ambulatory Visit | Attending: Sports Medicine

## 2020-01-20 DIAGNOSIS — S83249A Other tear of medial meniscus, current injury, unspecified knee, initial encounter: Secondary | ICD-10-CM

## 2020-01-20 DIAGNOSIS — S83281A Other tear of lateral meniscus, current injury, right knee, initial encounter: Secondary | ICD-10-CM | POA: Insufficient documentation

## 2020-01-20 HISTORY — DX: Presence of aortocoronary bypass graft: Z95.1

## 2020-01-20 HISTORY — DX: Hyperlipidemia, unspecified: E78.5

## 2020-01-20 SURGERY — ARTHROSCOPY KNEE WITH MENISECTOMY
Anesthesia: General | Laterality: Right | Wound class: Clean Wound: Uninfected operative wounds in which no inflammation occurred

## 2020-01-20 MED ORDER — METHYLPREDNISOLONE ACETATE 80 MG/ML SUSPENSION FOR INJECTION
Freq: Once | INTRAMUSCULAR | Status: DC | PRN
Start: 2020-01-20 — End: 2020-01-20
  Administered 2020-01-20: 80 mg via INTRAMUSCULAR

## 2020-01-20 MED ORDER — MEPERIDINE (PF) 25 MG/ML INJECTION SYRINGE
12.5000 mg | INJECTION | INTRAMUSCULAR | Status: DC | PRN
Start: 2020-01-20 — End: 2020-01-20

## 2020-01-20 MED ORDER — LACTATED RINGERS INTRAVENOUS SOLUTION
INTRAVENOUS | Status: DC
Start: 2020-01-20 — End: 2020-01-20

## 2020-01-20 MED ORDER — HYDROCODONE 7.5 MG-ACETAMINOPHEN 325 MG TABLET
1.0000 | ORAL_TABLET | Freq: Four times a day (QID) | ORAL | 0 refills | Status: AC | PRN
Start: 2020-01-20 — End: ?

## 2020-01-20 MED ORDER — HYDRALAZINE 20 MG/ML INJECTION SOLUTION
5.0000 mg | Freq: Once | INTRAMUSCULAR | Status: DC | PRN
Start: 2020-01-20 — End: 2020-01-20

## 2020-01-20 MED ORDER — LIDOCAINE HCL 10 MG/ML (1 %) INJECTION SOLUTION
Freq: Once | INTRAMUSCULAR | Status: DC | PRN
Start: 2020-01-20 — End: 2020-01-20
  Administered 2020-01-20: 20 mL via INTRADERMAL

## 2020-01-20 MED ORDER — PROPOFOL 10 MG/ML IV BOLUS
INJECTION | Freq: Once | INTRAVENOUS | Status: DC | PRN
Start: 2020-01-20 — End: 2020-01-20
  Administered 2020-01-20: 200 mg via INTRAVENOUS

## 2020-01-20 MED ORDER — FENTANYL (PF) 50 MCG/ML INJECTION SOLUTION
Freq: Once | INTRAMUSCULAR | Status: DC | PRN
Start: 2020-01-20 — End: 2020-01-20
  Administered 2020-01-20: 100 ug via INTRAVENOUS

## 2020-01-20 MED ORDER — SODIUM CHLORIDE 0.9 % (FLUSH) INJECTION SYRINGE
3.0000 mL | INJECTION | INTRAMUSCULAR | Status: DC | PRN
Start: 2020-01-20 — End: 2020-01-20

## 2020-01-20 MED ORDER — SODIUM CHLORIDE 0.9 % (FLUSH) INJECTION SYRINGE
3.0000 mL | INJECTION | Freq: Three times a day (TID) | INTRAMUSCULAR | Status: DC
Start: 2020-01-20 — End: 2020-01-20
  Administered 2020-01-20: 3 mL

## 2020-01-20 MED ORDER — DEXAMETHASONE SODIUM PHOSPHATE 4 MG/ML INJECTION SOLUTION
Freq: Once | INTRAMUSCULAR | Status: DC | PRN
Start: 2020-01-20 — End: 2020-01-20
  Administered 2020-01-20: 4 mg via INTRAVENOUS

## 2020-01-20 MED ORDER — HYDROMORPHONE 1 MG/ML INJECTION SYRINGE
0.5000 mg | INJECTION | INTRAMUSCULAR | Status: DC | PRN
Start: 2020-01-20 — End: 2020-01-20

## 2020-01-20 MED ORDER — CEFAZOLIN 2GM IN D5W 50ML MINIBAG IVPB
Freq: Once | INTRAVENOUS | Status: DC | PRN
Start: 2020-01-20 — End: 2020-01-20
  Administered 2020-01-20: 3000 mg via INTRAVENOUS

## 2020-01-20 MED ORDER — LIDOCAINE (PF) 100 MG/5 ML (2 %) INTRAVENOUS SYRINGE
INJECTION | Freq: Once | INTRAVENOUS | Status: DC | PRN
Start: 2020-01-20 — End: 2020-01-20
  Administered 2020-01-20: 100 mg via INTRAVENOUS

## 2020-01-20 MED ORDER — FENTANYL (PF) 50 MCG/ML INJECTION SOLUTION
INTRAMUSCULAR | Status: AC
Start: 2020-01-20 — End: 2020-01-20
  Filled 2020-01-20: qty 2

## 2020-01-20 MED ORDER — ONDANSETRON HCL (PF) 4 MG/2 ML INJECTION SOLUTION
4.0000 mg | Freq: Once | INTRAMUSCULAR | Status: DC | PRN
Start: 2020-01-20 — End: 2020-01-20

## 2020-01-20 MED ORDER — PROCHLORPERAZINE EDISYLATE 10 MG/2 ML (5 MG/ML) INJECTION SOLUTION
5.0000 mg | Freq: Once | INTRAMUSCULAR | Status: DC | PRN
Start: 2020-01-20 — End: 2020-01-20

## 2020-01-20 MED ORDER — ASPIRIN 81 MG TABLET,DELAYED RELEASE
81.0000 mg | DELAYED_RELEASE_TABLET | Freq: Two times a day (BID) | ORAL | Status: AC
Start: 2020-01-20 — End: ?

## 2020-01-20 MED ORDER — LABETALOL 5 MG/ML INTRAVENOUS SOLUTION
5.0000 mg | INTRAVENOUS | Status: DC | PRN
Start: 2020-01-20 — End: 2020-01-20

## 2020-01-20 MED ORDER — FENTANYL (PF) 50 MCG/ML INJECTION SOLUTION
25.0000 ug | INTRAMUSCULAR | Status: DC | PRN
Start: 2020-01-20 — End: 2020-01-20

## 2020-01-20 MED ORDER — SODIUM CHLORIDE 0.9 % IRRIGATION SOLUTION
Freq: Once | Status: DC | PRN
Start: 2020-01-20 — End: 2020-01-20
  Administered 2020-01-20: 3000 mL

## 2020-01-20 SURGICAL SUPPLY — 39 items
ADHESIVE TISSUE EXOFIN 1.0ML_PREMIERPRO EXOFIN (SEALANTS) ×1
APPL 70% ISPRP 2% CHG 26ML 13._2X13.2IN CHLRPRP PREP DEHP-FR (WOUND CARE/ENTEROSTOMAL SUPPLY) ×2
APPL 70% ISPRP 2% CHG 26ML CHLRPRP HI-LT ORNG PREP STRL LF  DISP CLR (WOUND CARE SUPPLY) ×2 IMPLANT
BANDAGE BLK2 4.1YDX4.5IN 6 PLY (WOUND CARE SUPPLY) ×1 IMPLANT
BANDAGE ESMARK 12FTX6IN ELAS P_LSTR RYN COMP BLU STRL LF (WOUND CARE SUPPLY) ×1 IMPLANT
BANDAGE TETRA-FLX 11YDX6IN CLI_P FR WVN ELAS HVDTY COMP STRL (WOUND CARE/ENTEROSTOMAL SUPPLY) ×1
BANDAGE TETRA-FLX 11YDX6IN STRL HVDTY CLIP FREE ELAS COMPRESS LF (WOUND CARE SUPPLY) ×1 IMPLANT
BLADE SHAVER 13CM 4MM COOLCUT TORPEDO 2 INNER CUT (SURGICAL CUTTING SUPPLIES) ×1 IMPLANT
BLADE SHAVER 13CM 4MM COOLCUT_TORPEDO 2 INR CUT (CUTTING ELEMENTS) ×1
BLADE SURG CLPR W 37.2MM GP EXIST HNDL GTT IN CHRG .23MM NONST LF  DISP (SURGICAL CUTTING SUPPLIES) IMPLANT
BLADE SURG CLPR W 37.2MM GP EX_IST HNDL GTT IN CHRG .23MM (CUTTING ELEMENTS)
CLOSURE SKIN STRIPS 1/2X4IN_R1547 6/PK 50PK/BX (WOUND CARE/ENTEROSTOMAL SUPPLY) ×1
CONV USE 405187 - CUFF TOURNIQUET BLU 30X4IN ATS 3K CYL 2 PORT BLADDER POS LOCK CONN SLEEVE STRL LF  DISP (ORTHOPEDICS (NOT IMPLANTS)) ×1 IMPLANT
CONV USE ITEM 156524 - ADHESIVE TISSUE EXOFIN 1.0ML_PREMIERPRO EXOFIN (SEALANTS) ×1 IMPLANT
CUFF TOURNIQUET 30IN X 4IN (ORTHOPEDICS (NOT IMPLANTS)) ×1
CUFF TOURNIQUET BLU 30X4IN ATS 3K CYL 2 PORT BLADDER POS LOCK CONN SLEEVE STRL LF  DISP (ORTHOPEDICS (NOT IMPLANTS)) ×1
CUFF TOURNIQUET RUBY 42X4IN ATS 2 PORT 1 BLADDER SLEEVE POS LOCK CONN STRL LF  DISP (ORTHOPEDICS (NOT IMPLANTS)) IMPLANT
CUFF TOURNIQUET RUBY 42X4IN AT_S CYL 1 PORT 1 BLADDER POS (ORTHOPEDICS (NOT IMPLANTS))
DRAPE ADH 51X47IN STRDRP LF  STRL DISP SURG CLR (PROTECTIVE PRODUCTS/GARMENTS) ×2 IMPLANT
DRAPE ADH 51X47IN U STRDRP LF_STRL DISP SURG PLASTIC CLR (PROTECTIVE PRODUCTS/GARMENTS) ×2
DRESS PETRO 8X1IN CURAD XR COTTON NONADH OCL IMPREGNATE LF  STRL WHT (WOUND CARE SUPPLY) ×1 IMPLANT
DRESSING XEROFORM 1X8_CUR253180 50EA/BX (WOUND CARE/ENTEROSTOMAL SUPPLY) ×1
GOWN SURG 2XL XLNG L4 REINF HKLP CLSR BRTHBL FILM SLEEVE (PROTECTIVE PRODUCTS/GARMENTS) ×1
GOWN SURG 2XL XLNG L4 REINF HKLP CLSR BRTHBL FILM SLEEVE STRL LF  DISP BLU SIRUS SMS PE 56IN (PROTECTIVE PRODUCTS/GARMENTS) ×1 IMPLANT
PACK SURG KNEE ASCP STRL DISP LF (CUSTOM TRAYS & PACK) ×1
PAD ABDOMINAL 9X5IN CLU ABS NW VN HDRPHB BCK SEAL EDGE LF (WOUND CARE/ENTEROSTOMAL SUPPLY) ×1
PAD ABDOMINAL 9X5IN CLU ABS NWVN HDRPHB BCK SEAL EDGE LF  STRL (WOUND CARE SUPPLY) ×1 IMPLANT
PAD ABDOMINAL 9X5IN CLU ABS NW_VN HDRPHB BCK SEAL EDGE LF (WOUND CARE/ENTEROSTOMAL SUPPLY) ×1
PAD POSITION VELCRO FOAM PVT POST NONST DISP (ORTHOPEDICS (NOT IMPLANTS)) ×1 IMPLANT
PAD POSITIONING PIVOT DISP_NONSTRL (ORTHOPEDICS (NOT IMPLANTS)) ×2
PMP TUBING 13FT CONT WV III DU ALWAVE ARTHRO STRL DISP (ORTHOPEDICS (NOT IMPLANTS)) ×1
PMP TUBING 13FT CONT WV III DU_ALWAVE ARTHRO STRL DISP (ORTHOPEDICS (NOT IMPLANTS)) ×1
POSITION OR LEG FOAM WELL (ORTHOPEDICS (NOT IMPLANTS)) ×2 IMPLANT
PUMP TUBING 13FT CONT WV III DUALWAVE ARTHRO STRL DISP (ORTHOPEDICS (NOT IMPLANTS)) ×1 IMPLANT
SPONGE GAUZE 4X4IN COTTON 16 P_LY WOVEN FOLD EDGE LF STRL (WOUND CARE SUPPLY) ×1 IMPLANT
STRIP 4X.5IN STRSTRP PLSTR REINF SKNCLS WHT STRL LF (WOUND CARE SUPPLY) ×1 IMPLANT
SUTURE 3-0 PS-1 ETHILON 18.0I_N BLK NYLON MONOF NYL N/ABSB (SUTURE/WOUND CLOSURE) IMPLANT
TRAY KNEE ARTHROSCOPY - ~~LOC~~ (CUSTOM TRAYS & PACK) ×1 IMPLANT
TRAY KNEE ARTHROSCOPY_CS/2 (CUSTOM TRAYS & PACK) ×1

## 2020-01-20 NOTE — OR PreOp (Signed)
COVID-19 Admission Screen    Low Risk:  None    Moderate/High Risk: {None    Test Result:Negative: Date: 01-17-2020

## 2020-01-20 NOTE — OR Surgeon (Signed)
Patient Name: [name] [name], [name] [name] Number: [id number]  Date of Service: 01/20/2020   Date of Birth:     All elements must be documented.    Pre-Operative Diagnosis:  Right knee lateral meniscus tear   Post-Operative Diagnosis:  Same, bucket-handle component not repairable  Procedure(s)/Description:  Right knee arthroscopy partial lateral meniscectomy  Findings:  As above     Attending Surgeon:  Shirley Friar  Assistant(s):  None    Anesthesia Type: General     Estimated Blood Loss:  Minimal  Blood Given:   Fluids Given:   Complications (not routinely expected or not inherent to difficulty/nature of procedure):  None  Characteristic Event (routinely expected or inherent to the difficulty/nature of the procedure):  None  Did the use of current and/or prior Anticoagulants impact the outcome of the case?no  Wound Class: Clean Wound: Uninfected operative wounds in which no inflammation occurred    Tubes: None  Drains: None  Specimens/ Cultures:   Implants:            Disposition: PACU - hemodynamically stable.  Condition: stable  DVT/PE Prophylaxis: Aspirin 81 mg b.i.d.    INDICATIONS:  Patient is a 54 year old male presented with right knee pain.  MRI and exam showed a lateral meniscus tear.  I recommended right knee arthroscopy with partial lateral meniscectomy.  The risks, benefits, details and non-operative alternatives were discussed with the patient. They agreed to proceed and gave informed written consent. Risks of the surgery include but are not limited to the following: cardiac arrest, death, infection, bleeding, stiffness, injury to vital structures (nerves, vessels, ligaments, tendons) permanent, disability, loss of limb, failure of surgery, deep vein thrombosis, or motor or sensory deficits.    PROCEDURE NOTE:    Patient was identified by myself in the preoperative holding area. The right knee was signed as per protocol.  The patient was then brought back to the operating room and placed on the operating  room table. General anesthesia was administered per the anesthesia record.  An unsterile tourniquet was placed on the right knee. The right lower extremity was then prepped draped in position in standard fashion.  Preoperative surgical pause was carried out and everybody agreed.  Preoperative antibiotics were given. A small stab incision was made in the anterior lateral portal. The arthroscope was then placed into the joint. The patellofemoral joint was in good.  The medial and lateral gutters were without loose bodies.  The medial compartment was entered.  The cartilage on the medial side was in good shape.  The medial meniscus was completed without tear.  The notch was entered the anterior cruciate ligament and posterior cruciate ligament were intact. The lateral compartment was entered.  The cartilage on the lateral side was in good shape.  The meniscus on the lateral side was torn.  It was a bucket-handle tear of the posterior horn and body.  It was not repairable because of its complex nature.  A combination of meniscal biter and shaver was used to trim the meniscus back to a stable rim.  Diagnostic arthroscopy pictures were taken throughout the case.  The portals were closed with a nylon stitch.  The joint was then infiltrated with local anesthetic and  steroid.  A sterile dressing was applied. The tourniquet was then let down.  The patient was woken from anesthesia and placed on a gurney and wheeled to recovery room in stable condition. Postoperative course will include discharge home today.  Weightbearing as tolerated with crutch  assistance.  Follow-up in the office in 2 weeks for stitch removal.  Change dressing in 3 days and keep incisions covered.  Aspirin 81 mg twice daily for deep vein thrombosis prophylaxis.

## 2020-01-20 NOTE — Anesthesia Postprocedure Evaluation (Signed)
Anesthesia Post Op Evaluation    Patient: [name] [name]  Procedure(s):  ARTHROSCOPY KNEE WITH PARTIAL MEDIAL MENISECTOMY    Last Vitals:Temperature: 36.8 C (98.2 F) (01/20/20 1201)  Heart Rate: 84 (01/20/20 1201)  BP (Non-Invasive): (!) 144/99 (01/20/20 1201)  Respiratory Rate: 18 (01/20/20 1201)  SpO2: 94 % (01/20/20 1201)    No complications documented.    Patient is sufficiently recovered from the effects of anesthesia to participate in the evaluation and has returned to their pre-procedure level.  Patient location during evaluation: PACU       Patient participation: complete - patient participated  Level of consciousness: awake and alert and responsive to verbal stimuli    Pain management: adequate  Airway patency: patent    Anesthetic complications: no  Cardiovascular status: acceptable  Respiratory status: acceptable  Hydration status: acceptable  Patient post-procedure temperature: Pt Normothermic   PONV Status: Absent

## 2020-01-20 NOTE — H&P (Signed)
NAME: [name] [name]  DATE: 12/26/2019  DOB:   MRN: [id number]      CHIEF COMPLAINT   Knee Pain (right knee injury )    HPI:  [name] [name] is a 54 y.o. male who presents today for right knee pain.  He states that he was stepping off of a ladder when he heard a pop.  He states this injury occurred on Nov 06, 2018. He does have a history of surgery on his meniscus that was performed approximately 5 years ago.  He states he is still having ongoing pain.  He describes his pain as intermittent sharp that he rates a 2/10 in clinic today.  He is experiencing buckling, clicking, locking catching.  He has complaints of mild numbness and tingling down his right leg into his foot.  His pain is increased with turning.  He is currently wearing a brace, applying ice and taking over-the-counter Tylenol for pain control.  He was previously seen and evaluated at [facility] and referred today for possible meniscus tear.  He has no other complaints of pain or discomfort in clinic today.  He denies any neurovascular symptoms or calf pain.    PAST MEDICAL HISTORY:      Patient Active Problem List   Diagnosis   . CAD (coronary artery disease)   . Chest pain   . Unstable angina (CMS HCC)   . Hypokalemia   . 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 COVID-19 virus infection    Reviewed  PAST SURGICAL HISTORY:        Past Surgical History:   Procedure Laterality Date   . CORONARY ARTERY ANGIOPLASTY     . HX DENTAL EXTRACTION     . HX STENTING (ANY)  06/15/2011   . HX TONSIL AND ADENOIDECTOMY          Reviewed   CURRENT MEDICATIONS:          Current Outpatient Medications   Medication Sig Dispense Refill   . acetaminophen/diphenhydramine (TYLENOL PM ORAL) Take 2 Caps by mouth Every night     . aspirin 81 mg Oral Tablet, Chewable Take 1 Tab (81 mg total) by mouth Once a day (Patient not taking: Reported on 09/30/2019) 60 Tab 2   . atorvastatin (LIPITOR) 40 mg Oral Tablet  Take 2 Tabs (80 mg total) by mouth Every evening for 90 days 180 Tab 0   . benralizumab (FASENRA) 30 mg/mL Subcutaneous Syringe 1 mL (30 mg total) by Subcutaneous route Every 8 weeks 6 mL 0   . clopidogreL (PLAVIX) 75 mg Oral Tablet Take 1 tablet by mouth once daily (Patient not taking: Reported on 09/30/2019) 90 Tab 0   . Famotidine (PEPCID) 10 mg Oral Tablet Take 10 mg by mouth Twice daily     . fluticasone propion-salmeteroL (ADVAIR) 500-50 mcg/dose Inhalation Disk with Device oral diskus inhaler Take 1 INHALATION by inhalation Twice daily     . guaiFENesin (ROBITUSSIN) 100 mg/5 mL Oral Liquid Take 10 mL (200 mg total) by mouth Every 4 hours as needed (Patient not taking: Reported on 12/26/2019) 473 mL 0   . ipratropium-albuterol 0.5 mg-3 mg(2.5 mg base)/3 mL Solution for Nebulization 3 mL by Nebulization route Four times a day     . metoprolol succinate (TOPROL-XL) 100 mg Oral Tablet Sustained Release 24 hr Take 100 mg by mouth Once a day (Patient not taking: Reported on 09/30/2019)     .  montelukast (SINGULAIR) 10 mg Oral Tablet Take 10 mg by mouth Every evening     . nitroglycerin (NITROSTAT) 0.4 mg Sublingual Tablet, Sublingual 0.4 mg by Sublingual route Every 5 minutes as needed for Chest pain for 3 doses over 15 minutes (Patient not taking: Reported on 09/30/2019)     . pantoprazole (PROTONIX) 20 mg Oral Tablet, Delayed Release (E.C.) Take 20 mg by mouth Every morning before breakfast     . predniSONE (DELTASONE) 10 mg Oral Tablet Take 10 mg by mouth Once a day (Patient not taking: Reported on 12/26/2019)     . traMADoL (ULTRAM) 50 mg Oral Tablet Take by mouth Every 6 hours as needed for Pain (Patient not taking: Reported on 09/30/2019)     . VENTOLIN HFA 90 mcg/actuation Inhalation HFA Aerosol Inhaler      . VITAMIN D 1,250 mcg (50,000 unit) Oral Capsule Take 50,000 Units by mouth Every 7 days       No current facility-administered medications for this visit.    Reviewed   ALLERGIES:   Phenergan [promethazine] Reviewed   SOCIAL HISTORY:   Social History           Socioeconomic History   . Marital status: Married     Spouse name: Not on file   . Number of children: Not on file   . Years of education: Not on file   . Highest education level: Not on file   Tobacco Use   . Smoking status: Former Smoker     Packs/day: 2.00     Years: 16.00     Pack years: 32.00     Quit date: 04/20/1992     Years since quitting: 27.7   . Smokeless tobacco: Never Used   Substance and Sexual Activity   . Alcohol use: No     Alcohol/week: 0.0 standard drinks   . Drug use: No   Other Topics Concern     Social Determinants of Health         Financial Resource Strain:    . Difficulty of Paying Living Expenses:    Food Insecurity:    . Worried About Programme researcher, broadcasting/film/video in the Last Year:    . Barista in the Last Year:    Transportation Needs:    . Freight forwarder (Medical):    Marland Kitchen Lack of Transportation (Non-Medical):    Physical Activity:    . Days of Exercise per Week:    . Minutes of Exercise per Session:    Stress:    . Feeling of Stress :    Intimate Partner Violence:    . Fear of Current or Ex-Partner:    . Emotionally Abused:    Marland Kitchen Physically Abused:    . Sexually Abused:     Reviewed   FAMILY HISTORY:       Family Medical History:        Problem Relation (Age of Onset)    Coronary Artery Disease Mother, Father              Reviewed     ROS  Right knee pain - all others were reviewed and negative    PHYSICAL EXAM  Ht 1.905 m (6\' 3" )   Wt 122 kg (270 lb)   BMI 33.75 kg/m             GENERAL EXAM    Constitutional: He is oriented to person, place, and time. He appears well-developed and well-nourished.  HENT:   Head: Normocephalic and atraumatic.   Eyes: Conjunctivae are normal. Pupils are equal, round.   Neck: Normal range of motion.   Cardiovascular: Normal rate.    Pulmonary/Chest: Effort normal.   Abdominal: Non-protuberant   Neurological: He is alert and oriented to  person, place, and time.   Skin: Skin is warm and dry.   Psychiatric: He has a normal mood and affect. His behavior is normal. Judgment and thought content normal.     Nursing note and vitals reviewed.    ORTHO EXAM   EXAM RIGHT KNEE    ROM 0-135  Lateral joint tenderness to palpation.  Pain with hyperflexion and extension.  Positive pain with Mcmurray's testing.  ACL/PCL/LCL/MCL are stable.  Mild Effusion.  Patella tracks nicely without apprehension.  Quad has good tone.  Skin is normal.  Calf is soft and supple.  Neurovascular exam is normal.  Passive ROM of hip is pain free.      IMAGING     PROCEDURE DESCRIPTION: XR KNEE RIGHT 4 OR MORE VIEWS    CLINICAL INDICATION: s/p injury    TECHNIQUE: 4 views of the right knee.    COMPARISON: Right knee x-ray 07/12/2007    FINDINGS:   Bone density is normal. Joint spaces are well maintained. No acute  fracture or dislocation is identified. There are no osseous erosions. Mild  bony spurring is present. Soft tissues are normal. No joint effusion is  visualized.    IMPRESSION:    No acute fracture or dislocation of the right knee.    Minimal degenerative change.      MRI imaging reviewed in office today reveals a lateral meniscus tear of the right knee.      ASSESSMENT      ICD-10-CM    1. Tear of medial meniscus of right knee, current, unspecified tear type, sequela  S83.241S ECG 12 LEAD - ADULT     XR CHEST AP AND LATERAL     CBC/DIFF     COMPREHENSIVE METABOLIC PANEL, NON-FASTING     [facility] SCHEDULED SURGICAL PROCEDURES   2. Preop testing  Z01.818 ECG 12 LEAD - ADULT     XR CHEST AP AND LATERAL     CBC/DIFF     COMPREHENSIVE METABOLIC PANEL, NON-FASTING     [facility] SCHEDULED SURGICAL PROCEDURES   3. Tear of lateral meniscus of right knee  S83.281A            PLAN   The plan for [name] [name] is to perform a right knee arthroscopy with partial lateral meniscectomy. A date was selected in clinic today for January 20, 2020. The  risks, benefits, details and non-operative alternatives were discussed with the patient. They agreed to proceed and gave informed written consent. Risks of the surgery include but are not limited to the following: cardiac arrest, death, infection, bleeding, stiffness, injury to vital structures (nerves, vessels, ligaments, tendons) permanent, disability, loss of limb, failure of surgery, deep vein thrombosis, or motor or sensory deficits.   I plan to see the patient back 2 weeks postoperatively for re-evaluation to monitor progression and healing.  As of now, I have advised the patient to continue treating the pain conservatively with the use of ice and over-the-counter medications. The patient states understanding and is in agreement with this plan.

## 2020-01-20 NOTE — Brief Op Note (Signed)
[facility]                                             BRIEF OPERATIVE NOTE    Patient Name: [name] [name], F[place] Number: [id number]  Date of Service: 01/20/2020   Date of Birth:     All elements must be documented.    Pre-Operative Diagnosis:  Right knee lateral meniscus tear   Post-Operative Diagnosis:  Same, bucket-handle component not repairable  Procedure(s)/Description:  Right knee arthroscopy partial lateral meniscectomy  Findings:  As above     Attending Surgeon:  Shirley Friar  Assistant(s):  None    Anesthesia Type: General     Estimated Blood Loss:  Minimal  Blood Given:   Fluids Given:   Complications (not routinely expected or not inherent to difficulty/nature of procedure):  None  Characteristic Event (routinely expected or inherent to the difficulty/nature of the procedure):  None  Did the use of current and/or prior Anticoagulants impact the outcome of the case?no  Wound Class: Clean Wound: Uninfected operative wounds in which no inflammation occurred    Tubes: None  Drains: None  Specimens/ Cultures:   Implants:            Disposition: PACU - hemodynamically stable.  Condition: stable  DVT/PE Prophylaxis: Aspirin 81 mg b.i.d.    Duard Larsen, MD

## 2020-01-20 NOTE — Discharge Instructions (Signed)
Hospital Operator [phone]

## 2020-01-20 NOTE — Anesthesia Transfer of Care (Signed)
ANESTHESIA TRANSFER OF CARE   [name] [name] is a 54 y.o. ,male, Weight: 124 kg (274 lb 0.5 oz)   had Procedure(s):  ARTHROSCOPY KNEE WITH PARTIAL MEDIAL MENISECTOMY  performed  01/20/20   Primary Service: Soundra Pilon, MD    Past Medical History:   Diagnosis Date   . Asthma    . Coronary artery disease    . CPAP (continuous positive airway pressure) dependence    . Esophageal reflux    . Hx of coronary artery bypass graft    . Hypercholesterolemia    . Hyperlipidemia    . Hypertension    . Knee injury    . MI (myocardial infarction) (CMS [place])    . Sleep apnea       Allergy History as of 01/20/20     PROMETHAZINE       Noted Status Severity Type Reaction    03/27/15 0657 Erie Noe, RN 01/25/13 Active High  Seizure    03/27/15 0657 Erie Noe, RN 01/25/13 Active   Seizure    01/25/13 1440 Ninetta Lights 01/25/13 Active                 I completed my transfer of care / handoff to the receiving personnel during which we discussed:  Access, Airway, All key/critical aspects of case discussed, Analgesia, Antibiotics, Expectation of post procedure, Fluids/Product, Gave opportunity for questions and acknowledgement of understanding, Labs and PMHx    Post Location: PACU                                                                  Last OR Temp: Temperature: 36.6 C (97.9 F)  ABG:  PH (ARTERIAL)   Date Value Ref Range Status   09/11/2016 7.40 7.35 - 7.45 Final     PCO2 (ARTERIAL)   Date Value Ref Range Status   09/11/2016 40.0 35.0 - 45.0 mm/Hg Final     PCO2 (PCO2P)   Date Value Ref Range Status   09/10/2016 38 35 - 45 mmHg Final     PCO2 (VENOUS)   Date Value Ref Range Status   04/18/2019 38.00 38.00 - 50.00 mm/Hg Final     PO2 (ARTERIAL)   Date Value Ref Range Status   09/11/2016 79.0 72.0 - 100.0 mm/Hg Final     PO2 (PO2P)   Date Value Ref Range Status   09/10/2016 97 72 - 100 mmHg Final     PO2 (VENOUS)   Date Value Ref Range Status   04/18/2019 57.0 (H) 30.0 - 50.0 mm/Hg Final     SODIUM   Date Value  Ref Range Status   09/11/2016 137 136 - 145 mmol/L Final     POTASSIUM   Date Value Ref Range Status   01/17/2020 4.2 3.5 - 5.0 mmol/L Final   04/30/2013 4.4 3.6 - 5.1 mEq/L Final     POTASSIUM, POC   Date Value Ref Range Status   09/10/2016 3.8 3.5 - 5.0 mmol/L Final     WHOLE BLOOD POTASSIUM   Date Value Ref Range Status   09/11/2016 4.6 3.5 - 5.0 mmol/L Final     CHLORIDE   Date Value Ref Range Status   09/11/2016 109 96 - 111 mmol/L Final  CALCIUM   Date Value Ref Range Status   01/17/2020 8.9 8.8 - 10.3 mg/dL Final   04/30/2013 9.3 8.9 - 10.3 mg/dL Final     Calculated P Axis   Date Value Ref Range Status   01/17/2020 57 deg Final     Calculated R Axis   Date Value Ref Range Status   04/26/2018 121 degrees Final     Calculated T Axis   Date Value Ref Range Status   01/17/2020 16 deg Final     CAT DANDER (E1) INTERPRETATION   Date Value Ref Range Status   11/16/2018 High Level Absent / Undetectable Final     CAT DANDER (E1)   Date Value Ref Range Status   11/16/2018 11.00 (H) <0.10 kU/L Final     IONIZED CALCIUM   Date Value Ref Range Status   09/15/2016 1.11 1.10 - 1.36 mmol/L Final     IONIZED CALCIUM, POC   Date Value Ref Range Status   09/10/2016 1.10 (L) 1.30 - 1.46 mmol/L Final     LACTATE   Date Value Ref Range Status   09/11/2016 1.1 0.0 - 1.3 mmol/L Final     HEMOGLOBIN   Date Value Ref Range Status   09/11/2016 7.4 (L) 12.0 - 18.0 g/dL Final     OXYHEMOGLOBIN   Date Value Ref Range Status   09/11/2016 95.5 85.0 - 98.0 % Final     CARBOXYHEMOGLOBIN   Date Value Ref Range Status   09/11/2016 2.6 (H) 0.0 - 2.5 % Final     MET-HEMOGLOBIN   Date Value Ref Range Status   09/11/2016 1.4 0.0 - 3.5 % Final     BASE EXCESS   Date Value Ref Range Status   04/18/2019 0.3 -2.0 - 3.0 mmol/L Final     BASE EXCESS (ARTERIAL)   Date Value Ref Range Status   09/11/2016 0.0 0.0 - 1.0 mmol/L Final     BASE EXCESS (BEP)   Date Value Ref Range Status   09/10/2016 -7.0 (L) -2.0 - 3.0 mmol/L Final     [place] GRASS (G2)  INTERPRETATION   Date Value Ref Range Status   11/16/2018 Very Low Level Absent / Undetectable Final     [place] GRASS (G2)   Date Value Ref Range Status   11/16/2018 0.13 (H) <0.10 kU/L Final     BASE DEFICIT   Date Value Ref Range Status   09/11/2016 0.7 0.0 - 3.0 mmol/L Final     BICARBONATE (ARTERIAL)   Date Value Ref Range Status   09/11/2016 24.9 18.0 - 26.0 mmol/L Final     HCO3 (HCO3P)   Date Value Ref Range Status   09/10/2016 19 (L) 22 - 26 mmol/L Final     BICARBONATE (VENOUS)   Date Value Ref Range Status   04/18/2019 25.0 23.0 - 33.0 mmol/L Final     %FIO2 (VENOUS)   Date Value Ref Range Status   04/18/2019 21.0 % Final     Airway:* No LDAs found *  Blood pressure (!) 135/94, pulse 74, temperature 36.6 C (97.9 F), resp. rate (!) 11, height 1.88 m ('6\' 2"'$ ), weight 124 kg (274 lb 0.5 oz), SpO2 94 %.

## 2020-01-20 NOTE — Anesthesia Preprocedure Evaluation (Addendum)
ANESTHESIA PRE-OP EVALUATION  Planned Procedure: ARTHROSCOPY KNEE WITH PARTIAL MEDIAL MENISECTOMY (Right )  Review of Systems     anesthesia history negative   no family history of anesthetic complications   patient summary reviewed          Pulmonary       Former smoker; 2 ppd x 16 years    , asthma, sleep apnea and CPAP,  no home oxygen   Cardiovascular    Hypertension, well controlled, past MI, CAD,     01/17/20 EKG:  Sinus rhythm  Borderline Right axis deviation  Abnormal R-wave progression, late transition  ST elev, probable normal early repol pattern    04/22/19 Echo:  1. Normal left ventricular size. Normal left ventricular ejection fraction. LV Ejection Fraction is 60 %.  2. Normal right ventricular size. Normal right ventricular systolic function. Prominent moderator band -  normal variant. Has calcified chordae tendae. Abnormal TTE was likely due to the calcified chordae tendae  with a moderator band.  3. The left atrium is normal in size.  4. Prominent Eustachian valve (normal variant).  5. There is mild mitral valve regurgitation.  6. There is mild tricuspid regurgitation.    04/22/19 MPS:  .  Patient overall decreased activity in the apex without reversibility most likely secondary to artifact otherwise no ischemia  .  Normal ejection fraction 53% with borderline apical hypokinesis  .  Negative test for ischemia    , CABG (1V) and cardiac stents (None in the last year) ,No dysrhythmias,  Exercise Tolerance: > or = 4 METS        GI/Hepatic/Renal    GERD no liver disease     Endo/Other    obesity and chronic steroid use, no hypothyroidism and no hyperthyroidism   no type 2 diabetes,     Neuro/Psych/MS    no seizures and no CVA       Cancer  negative hematology/oncology ROS,                  Physical Assessment      Patient summary reviewed   Airway       Mallampati: I    TM distance: >3 FB    Neck ROM: full  Mouth Opening: good.  Facial hair          Dental           (+) edentulous           Pulmonary     Comment:     Former smoker; 2 ppd x 16 years      Breath sounds clear to auscultation       Cardiovascular    Rhythm: regular    (-) no murmur     Other findings            Plan  ASA 3     Planned anesthesia type: general LMA              Intravenous induction       Anesthetic plan and risks discussed with patient.      Use of blood products discussed with patient who consented to blood products.     Patient's NPO status is appropriate for Anesthesia.                       Temperature: 36.8 C (98.2 F)  Heart Rate: 84  Respiratory Rate: 18  BP (Non-Invasive): (!) 144/99  SpO2: 94 %  BP Readings from Last 5 Encounters:   01/20/20 (!) 144/99   11/06/19 (!) 135/96   09/30/19 120/88   06/12/19 (!) 123/96   05/02/19 123/87       BMI (Calculated): 35.26 (01/20/2020 12:01 PM)      CBC  Diff   Lab Results   Component Value Date/Time    WBC 8.3 01/17/2020 10:19 AM    HGB 16.5 01/17/2020 10:19 AM    HCT 49.2 01/17/2020 10:19 AM    PLTCNT 247 01/17/2020 10:19 AM    RBC 5.63 01/17/2020 10:19 AM    MCV 87.4 01/17/2020 10:19 AM    MCHC 33.6 01/17/2020 10:19 AM    MCH 29.3 01/17/2020 10:19 AM    RDW 13.2 01/17/2020 10:19 AM    MPV 9.0 01/17/2020 10:19 AM    Lab Results   Component Value Date/Time    PMNS 59 01/17/2020 10:19 AM    LYMPHOCYTES 29 01/17/2020 10:19 AM    EOSINOPHIL 1 01/17/2020 10:19 AM    MONOCYTES 10 01/17/2020 10:19 AM    BASOPHILS 1 01/17/2020 10:19 AM    BASOPHILS 0.10 01/17/2020 10:19 AM    PMNABS 4.90 01/17/2020 10:19 AM    LYMPHSABS 2.40 01/17/2020 10:19 AM    EOSABS 0.10 01/17/2020 10:19 AM    MONOSABS 0.90 (H) 01/17/2020 10:19 AM            Basic Metabolic Profile    Lab Results   Component Value Date/Time    SODIUM 138 01/17/2020 10:19 AM    POTASSIUM 4.2 01/17/2020 10:19 AM    CHLORIDE 105 01/17/2020 10:19 AM    CO2 24 01/17/2020 10:19 AM    ANIONGAP 9 01/17/2020 10:19 AM    Lab Results   Component Value Date/Time    BUN 14 01/17/2020 10:19 AM    CREATININE 1.07 01/17/2020 10:19 AM    GLUCOSENF 116 (H)  01/17/2020 10:19 AM        Lab Results   Component Value Date    HA1C 5.9 (H) 04/19/2019       Lab Results   Component Value Date    TSH 0.547 06/11/2019          Hepatic Function    Lab Results   Component Value Date/Time    ALBUMIN 4.3 01/17/2020 10:19 AM    TOTALPROTEIN 6.3 01/17/2020 10:19 AM    ALKPHOS 85 01/17/2020 10:19 AM    PROTHROMTME 11.2 06/11/2019 10:20 PM    INR 0.97 06/11/2019 10:20 PM    Lab Results   Component Value Date/Time    AST 14 01/17/2020 10:19 AM    ALT 19 01/17/2020 10:19 AM    BILIRUBINCON 0.2 06/11/2019 10:20 PM

## 2020-02-04 ENCOUNTER — Encounter (HOSPITAL_BASED_OUTPATIENT_CLINIC_OR_DEPARTMENT_OTHER): Payer: Self-pay | Admitting: Sports Medicine

## 2020-02-04 ENCOUNTER — Other Ambulatory Visit: Payer: Self-pay

## 2020-02-04 ENCOUNTER — Ambulatory Visit: Payer: Worker's Compensation | Admitting: Sports Medicine

## 2020-02-04 VITALS — Ht 75.0 in | Wt 270.0 lb

## 2020-02-04 DIAGNOSIS — Z9889 Other specified postprocedural states: Secondary | ICD-10-CM

## 2020-02-04 DIAGNOSIS — S83281A Other tear of lateral meniscus, current injury, right knee, initial encounter: Secondary | ICD-10-CM

## 2020-02-04 DIAGNOSIS — Z87891 Personal history of nicotine dependence: Secondary | ICD-10-CM

## 2020-02-04 DIAGNOSIS — S83281D Other tear of lateral meniscus, current injury, right knee, subsequent encounter: Secondary | ICD-10-CM

## 2020-02-07 NOTE — Progress Notes (Signed)
ORTHOPEDICS, MEDICAL OFFICE BUILDING  [address]  [facility] [place] [address]      NAME: [name] [name]  DATE: 02/04/2020  DOB:   MRN: [id number]      CHIEF COMPLAINT   Post Op (RIGHT KNEE SCOPE PLM DOS:01/20/2020)    HPI:  [name] [name] is a 54 year old male here status post right knee arthroscopy with partial lateral meniscectomy on January 20, 2020.  He has done well since surgery.  He has had no new injuries.  This is a worker's compensation and of intermittent minor cramping like pain with palpation and certain movements that he rates at 2 of 10. He has been using ice, rest, physical therapy, and Tylenol for his pain with relief.  He complains of his knee feeling very unstable.  He denies popping or clicking.  He denies locking or catching.  He has no incisional, calf, neurovascular, or radicular-type signs or symptoms.      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)    COVID-19 virus infection    REVIEWED   PAST SURGICAL HISTORY:  Past Surgical History:   Procedure Laterality Date    CORONARY ARTERY ANGIOPLASTY      HX ADENOIDECTOMY      HX CORONARY ARTERY BYPASS GRAFT      1 vessel bypass    HX DENTAL EXTRACTION      HX STENTING (ANY)  06/15/2011    HX TONSIL AND ADENOIDECTOMY      HX TONSILLECTOMY          REVIEWED   CURRENT MEDICATIONS:   Current Outpatient Medications   Medication Sig Dispense Refill    aspirin (ECOTRIN) 81 mg Oral Tablet, Delayed Release (E.C.) Take 1 Tablet (81 mg total) by mouth Twice daily      atorvastatin (LIPITOR) 40 mg Oral Tablet Take 2 Tabs (80 mg total) by mouth Every evening for 90 days 180 Tab 0    benralizumab (FASENRA) 30 mg/mL Subcutaneous Syringe 1 mL (30 mg total) by Subcutaneous route Every 8 weeks 6 mL 0    Famotidine (PEPCID) 10 mg Oral Tablet Take 10 mg by  mouth Twice daily      fluticasone propion-salmeteroL (ADVAIR) 500-50 mcg/dose Inhalation Disk with Device oral diskus inhaler Take 1 INHALATION by inhalation Twice daily      HYDROcodone-acetaminophen (NORCO) 7.5-325 mg Oral Tablet Take 1 Tablet by mouth Every 6 hours as needed for Pain 15 Tablet 0    ipratropium-albuterol 0.5 mg-3 mg(2.5 mg base)/3 mL Solution for Nebulization 3 mL by Nebulization route Four times a day      montelukast (SINGULAIR) 10 mg Oral Tablet Take 10 mg by mouth Every evening      pantoprazole (PROTONIX) 20 mg Oral Tablet, Delayed Release (E.C.) Take 20 mg by mouth Every morning before breakfast      VENTOLIN HFA 90 mcg/actuation Inhalation HFA Aerosol Inhaler        No current facility-administered medications for this visit.    REVIEWED   ALLERGIES:  Phenergan [promethazine] REVIEWED   SOCIAL HISTORY:   Social History     Socioeconomic History    Marital status: Married     Spouse name: Not on file    Number of children: Not on file  Years of education: Not on file    Highest education level: Not on file   Tobacco Use    Smoking status: Former Smoker     Packs/day: 2.00     Years: 16.00     Pack years: 32.00     Quit date: 04/20/1992     Years since quitting: 27.8    Smokeless tobacco: Never Used   Substance and Sexual Activity    Alcohol use: No     Alcohol/week: 0.0 standard drinks    Drug use: No   Other Topics Concern     Social Determinants of Health     Financial Resource Strain:     Difficulty of Paying Living Expenses:    Food Insecurity:     Worried About Programme researcher, broadcasting/film/video in the Last Year:     Barista in the Last Year:    Transportation Needs:     Freight forwarder (Medical):     Lack of Transportation (Non-Medical):    Physical Activity:     Days of Exercise per Week:     Minutes of Exercise per Session:    Stress:     Feeling of Stress :    Intimate Partner Violence:     Fear of Current or Ex-Partner:     Emotionally Abused:      Physically Abused:     Sexually Abused:     REVIEWED   FAMILY HISTORY:  Family Medical History:     Problem Relation (Age of Onset)    Coronary Artery Disease Mother, Father           REVIEWED     ROS  Status post right knee arthroscopy with partial lateral meniscectomy date of surgery January 20, 2020- all others were reviewed and negative    PHYSICAL EXAM  Ht 1.905 m (6\' 3" )    Wt 122 kg (270 lb)    BMI 33.75 kg/m             GENERAL EXAM    Constitutional: He is oriented to person, place, and time. He appears well-developed and well-nourished.   HENT:   Head: Normocephalic and atraumatic.   Eyes: Conjunctivae are normal. Pupils are equal, round.   Neck: Normal range of motion.   Cardiovascular: Normal rate.    Pulmonary/Chest: Effort normal.   Abdominal: Non-protuberant   Neurological: He is alert and oriented to person, place, and time.   Skin: Skin is warm and dry.   Psychiatric: He has a normal mood and affect. His behavior is normal. Judgment and thought content normal.     Nursing note and vitals reviewed.    ORTHO EXAM   No deformity.  Minimal effusion.  Incision looks good.  Normal skin.  No erythema, drainage, or fluctuance.  Good muscle tone of quadriceps and hamstrings.  Range of motion 0-135 degrees mildly stiff.  No pain with palpation medial, lateral, anterior and posterior including joint lines.  MCL, LCL, ACL, PCL are intact with testing.  Mild patellofemoral crepitus.  Patella tracks normally with ROM.  Strength testing is 5/5.  Extensor mechanism is intact.  Sensation is intact in all nerve distributions.  Pulses are palpable with brisk cap refill.  Calf is soft and supple.  No lymphedema.  Passive ROM of hip is pain free.  Toes pink and warm with brisk capillary refill.        IMAGING  ASSESSMENT      ICD-10-CM    1. Tear of lateral meniscus of right knee  S83.281A    2. Status post surgery  Z98.890     DOS: 01/20/2020           PLAN   The plan for [name] [name] is to have him continue with  physical therapy and quadriceps strengthening.  The patient states that his physical therapist advised he would like for the patient to complete all therapy sessions before he has to return to work.  He was advised to remain off of work for the next 4 weeks.  We advised that if he would like to return to work sooner he may call the office for release letter.  We will plan to see him back in the office in 4 weeks, sooner if he develops any problems or concerns.  He verbalized understanding and agreed with this plan.              I am scribing for, and in the presence of, Dr. Duard Larsen for services provided on 02/04/2020.    Bertram Denver, MA            The above-stated note was read and reviewed thoroughly.  Patient was seen by myself in the presence of Fredrik Cove MA/Scribe.  I attest that the history and physical exam, assessment, and plan are correct.

## 2020-03-02 ENCOUNTER — Telehealth (HOSPITAL_BASED_OUTPATIENT_CLINIC_OR_DEPARTMENT_OTHER): Payer: Self-pay | Admitting: Sports Medicine

## 2020-03-02 NOTE — Telephone Encounter (Addendum)
Called back [name] [name]. No answer, left vm advising her to return my call. Annice Pih, [place]  03/02/2020, 09:20    ----- Message from Sherlene Shams, [facility] sent at 02/28/2020  3:06 PM EDT -----  Regarding: later appt time  [facility] Nurse case manager Efraim Kaufmann) called in requesting to change pts 9/14 9am appt to around 1pm if possible so she can attend with the pt. Efraim Kaufmann is  requesting a return call asap @ [phone]  Sherlene Shams, [facility]  02/28/2020, 15:08

## 2020-03-02 NOTE — Telephone Encounter (Signed)
Called Melissa (work Sports coach) back in regards to patient. No answer, left vm. Annice Pih, [place]  03/02/2020, 13:06

## 2020-03-03 ENCOUNTER — Other Ambulatory Visit: Payer: Self-pay

## 2020-03-03 ENCOUNTER — Ambulatory Visit: Payer: Worker's Compensation | Admitting: Sports Medicine

## 2020-03-03 ENCOUNTER — Encounter (HOSPITAL_BASED_OUTPATIENT_CLINIC_OR_DEPARTMENT_OTHER): Payer: Self-pay | Admitting: Sports Medicine

## 2020-03-03 VITALS — Ht 75.0 in | Wt 270.0 lb

## 2020-03-03 DIAGNOSIS — S83281A Other tear of lateral meniscus, current injury, right knee, initial encounter: Secondary | ICD-10-CM

## 2020-03-03 DIAGNOSIS — Z9889 Other specified postprocedural states: Secondary | ICD-10-CM

## 2020-03-04 NOTE — Progress Notes (Signed)
ORTHOPEDICS, MEDICAL OFFICE BUILDING  [address]  [facility] [place] [address]      NAME: [name] [name]  DATE: 03/03/2020  DOB:   MRN: [id number]      CHIEF COMPLAINT   Post Op (right knee arthroscopy w/ PMM; DOS 01-20-20 )    HPI:  [name] [name] is a 54 y.o. male who presents today to follow up after a right knee arthroscopy partial lateral meniscectomy that was performed on January 20, 2020. He has had no new injury since his surgery.  He describes his pain as intermittent and tight that he rates as 0/10 in clinic today.  He denies any buckling, clicking, locking or catching.  He denies any numbness or tingling.  He states that overall he has been doing well.  He has no other complaints pain or discomfort in clinic today.  He denies any neurovascular symptoms or calf pain.    PAST MEDICAL HISTORY:  Patient Active Problem List   Diagnosis   . CAD (coronary artery disease)   . Chest pain   . Unstable angina (CMS HCC)   . Hypokalemia   . 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 COVID-19 virus infection    Reviewed  PAST SURGICAL HISTORY:  Past Surgical History:   Procedure Laterality Date   . CORONARY ARTERY ANGIOPLASTY     . HX ADENOIDECTOMY     . HX CORONARY ARTERY BYPASS GRAFT      1 vessel bypass   . HX DENTAL EXTRACTION     . HX STENTING (ANY)  06/15/2011   . HX TONSIL AND ADENOIDECTOMY     . HX TONSILLECTOMY          Reviewed   CURRENT MEDICATIONS:   Current Outpatient Medications   Medication Sig Dispense Refill   . aspirin (ECOTRIN) 81 mg Oral Tablet, Delayed Release (E.C.) Take 1 Tablet (81 mg total) by mouth Twice daily     . atorvastatin (LIPITOR) 40 mg Oral Tablet Take 2 Tabs (80 mg total) by mouth Every evening for 90 days 180 Tab 0   . benralizumab (FASENRA) 30 mg/mL Subcutaneous Syringe 1 mL (30 mg total) by Subcutaneous route Every 8 weeks 6 mL 0   . Famotidine (PEPCID) 10 mg Oral Tablet Take 10 mg by mouth Twice daily     . fluticasone  propion-salmeteroL (ADVAIR) 500-50 mcg/dose Inhalation Disk with Device oral diskus inhaler Take 1 INHALATION by inhalation Twice daily     . HYDROcodone-acetaminophen (NORCO) 7.5-325 mg Oral Tablet Take 1 Tablet by mouth Every 6 hours as needed for Pain 15 Tablet 0   . ipratropium-albuterol 0.5 mg-3 mg(2.5 mg base)/3 mL Solution for Nebulization 3 mL by Nebulization route Four times a day     . montelukast (SINGULAIR) 10 mg Oral Tablet Take 10 mg by mouth Every evening     . pantoprazole (PROTONIX) 20 mg Oral Tablet, Delayed Release (E.C.) Take 20 mg by mouth Every morning before breakfast     . VENTOLIN HFA 90 mcg/actuation Inhalation HFA Aerosol Inhaler        No current facility-administered medications for this visit.    Reviewed   ALLERGIES:  Phenergan [promethazine] Reviewed   SOCIAL HISTORY:   Social History     Socioeconomic History   . Marital status: Married     Spouse name: Not on file   . Number of children: Not on file   . Years  of education: Not on file   . Highest education level: Not on file   Tobacco Use   . Smoking status: Former Smoker     Packs/day: 2.00     Years: 16.00     Pack years: 32.00     Quit date: 04/20/1992     Years since quitting: 27.8   . Smokeless tobacco: Never Used   Substance and Sexual Activity   . Alcohol use: No     Alcohol/week: 0.0 standard drinks   . Drug use: No   Other Topics Concern     Social Determinants of Health     Financial Resource Strain:    . Difficulty of Paying Living Expenses:    Food Insecurity:    . Worried About Programme researcher, broadcasting/film/video in the Last Year:    . Barista in the Last Year:    Transportation Needs:    . Freight forwarder (Medical):    Marland Kitchen Lack of Transportation (Non-Medical):    Physical Activity:    . Days of Exercise per Week:    . Minutes of Exercise per Session:    Stress:    . Feeling of Stress :    Intimate Partner Violence:    . Fear of Current or Ex-Partner:    . Emotionally Abused:    Marland Kitchen Physically Abused:    . Sexually Abused:      Reviewed   FAMILY HISTORY:  Family Medical History:     Problem Relation (Age of Onset)    Coronary Artery Disease Mother, Father           Reviewed     ROS  Right knee arthroscopy partial lateral meniscectomy - all others were reviewed and negative    PHYSICAL EXAM  Ht 1.905 m (6\' 3" )   Wt 122 kg (270 lb)   BMI 33.75 kg/m             GENERAL EXAM    Constitutional: He is oriented to person, place, and time. He appears well-developed and well-nourished.   HENT:   Head: Normocephalic and atraumatic.   Eyes: Conjunctivae are normal. Pupils are equal, round.   Neck: Normal range of motion.   Cardiovascular: Normal rate.    Pulmonary/Chest: Effort normal.   Abdominal: Non-protuberant   Neurological: He is alert and oriented to person, place, and time.   Skin: Skin is warm and dry.   Psychiatric: He has a normal mood and affect. His behavior is normal. Judgment and thought content normal.     Nursing note and vitals reviewed.    ORTHO EXAM   No deformity.  No effusion.  Normal skin. Incisions look good. No erythema, drainage, or fluctuance.  Good muscle tone of quadriceps and hamstrings.  Range of motion 0-140.  No pain with palpation medial, lateral, anterior and posterior including joint lines.  MCL, LCL, ACL, PCL are intact with testing.  No patellofemoral crepitus, pain or apprehension.  Patella tracks normally with ROM.  Strength testing is 5/5.  Extensor mechanism is intact.  Sensation is intact in all nerve distributions.  Pulses are palpable with brisk cap refill.  Calf is soft and supple.  No lymphedema.  Passive ROM of hip is pain free.      IMAGING     No new imaging.         ASSESSMENT      ICD-10-CM    1. Tear of lateral meniscus of right knee  F75.102H    2. Status post surgery  Z98.890            PLAN   Upon evaluation today, the patient is progressing as expected.  He is to gradually increase back into activities as tolerated.  As of now, plan to see him back on an as-needed basis or sooner if he  develops any problems or concerns.  He is to continue treating any pain he has conservatively. I advised he is able to return to work full duty with no restrictions.  The patient states understanding and is in agreement with this plan.      No orders of the defined types were placed in this encounter.      The patient was advised to call with any questions, concerns or worsening symptoms.    This note was partially generated using MModal Fluency Direct system, and there may be some incorrect words, spellings, and punctuation that were not noted in checking the note before saving.    Annice Pih, MA  I am scribing for, and in the presence of, Dr. Shirley Friar for services provided on 03/03/2020.  Annice Pih, MA       Note was read.  Assessment and plan is correct.  Any changes were noted   JJF

## 2020-04-13 ENCOUNTER — Ambulatory Visit (INDEPENDENT_AMBULATORY_CARE_PROVIDER_SITE_OTHER): Payer: Self-pay | Admitting: Social Worker

## 2020-04-13 ENCOUNTER — Encounter (INDEPENDENT_AMBULATORY_CARE_PROVIDER_SITE_OTHER): Payer: Self-pay

## 2020-04-13 NOTE — Progress Notes (Signed)
The patient did not appear for their appointment/or scheduled appointment was cancelled.  This office visit opened in error.

## 2020-04-16 ENCOUNTER — Encounter (HOSPITAL_BASED_OUTPATIENT_CLINIC_OR_DEPARTMENT_OTHER): Payer: Self-pay | Admitting: Internal Medicine

## 2020-07-27 DIAGNOSIS — I10 Essential (primary) hypertension: Secondary | ICD-10-CM | POA: Diagnosis present

## 2020-08-19 DIAGNOSIS — G4733 Obstructive sleep apnea (adult) (pediatric): Secondary | ICD-10-CM | POA: Diagnosis present

## 2020-12-24 ENCOUNTER — Encounter (HOSPITAL_COMMUNITY)
Admission: EM | Disposition: A | Discharge status: Home / Self Care | Payer: Self-pay | Source: Home / Self Care | Attending: Emergency Medicine

## 2020-12-24 ENCOUNTER — Other Ambulatory Visit: Payer: Self-pay

## 2020-12-24 ENCOUNTER — Encounter (HOSPITAL_COMMUNITY): Payer: Self-pay | Admitting: *Deleted

## 2020-12-24 ENCOUNTER — Emergency Department (HOSPITAL_COMMUNITY): Payer: BLUE CROSS/BLUE SHIELD

## 2020-12-24 ENCOUNTER — Observation Stay (HOSPITAL_COMMUNITY)
Admission: EM | Admit: 2020-12-24 | Discharge: 2020-12-25 | Disposition: A | Discharge status: Home / Self Care | Payer: BLUE CROSS/BLUE SHIELD | Attending: Cardiology | Admitting: Cardiology

## 2020-12-24 DIAGNOSIS — R079 Chest pain, unspecified: Secondary | ICD-10-CM | POA: Diagnosis present

## 2020-12-24 DIAGNOSIS — I25110 Atherosclerotic heart disease of native coronary artery with unstable angina pectoris: Secondary | ICD-10-CM | POA: Diagnosis not present

## 2020-12-24 DIAGNOSIS — Z955 Presence of coronary angioplasty implant and graft: Secondary | ICD-10-CM | POA: Diagnosis not present

## 2020-12-24 DIAGNOSIS — J45909 Unspecified asthma, uncomplicated: Secondary | ICD-10-CM | POA: Insufficient documentation

## 2020-12-24 DIAGNOSIS — I2510 Atherosclerotic heart disease of native coronary artery without angina pectoris: Secondary | ICD-10-CM

## 2020-12-24 DIAGNOSIS — Z951 Presence of aortocoronary bypass graft: Secondary | ICD-10-CM | POA: Diagnosis not present

## 2020-12-24 DIAGNOSIS — R072 Precordial pain: Secondary | ICD-10-CM

## 2020-12-24 DIAGNOSIS — E785 Hyperlipidemia, unspecified: Secondary | ICD-10-CM

## 2020-12-24 DIAGNOSIS — Z20822 Contact with and (suspected) exposure to covid-19: Secondary | ICD-10-CM | POA: Diagnosis not present

## 2020-12-24 DIAGNOSIS — I200 Unstable angina: Secondary | ICD-10-CM

## 2020-12-24 DIAGNOSIS — I10 Essential (primary) hypertension: Secondary | ICD-10-CM | POA: Diagnosis not present

## 2020-12-24 HISTORY — DX: Unspecified asthma, uncomplicated: J45.909

## 2020-12-24 HISTORY — PX: LEFT HEART CATH AND CORS/GRAFTS ANGIOGRAPHY: CATH118250

## 2020-12-24 HISTORY — DX: Atherosclerotic heart disease of native coronary artery without angina pectoris: I25.10

## 2020-12-24 HISTORY — DX: Gastro-esophageal reflux disease without esophagitis: K21.9

## 2020-12-24 HISTORY — DX: Essential (primary) hypertension: I10

## 2020-12-24 LAB — BASIC METABOLIC PANEL
Anion gap: 6 (ref 5–15)
BUN: 20 mg/dL (ref 6–20)
CO2: 24 mmol/L (ref 22–32)
Calcium: 8.8 mg/dL — ABNORMAL LOW (ref 8.9–10.3)
Chloride: 107 mmol/L (ref 98–111)
Creatinine, Ser: 1.02 mg/dL (ref 0.61–1.24)
GFR, Estimated: >60 mL/min (ref >60)
Glucose, Bld: 127 mg/dL — ABNORMAL HIGH (ref 70–99)
Potassium: 3.9 mmol/L (ref 3.5–5.1)
Sodium: 137 mmol/L (ref 135–145)

## 2020-12-24 LAB — RESP PANEL BY RT-PCR (FLU A&B, COVID) ARPGX2
Influenza A by PCR: NEGATIVE
Influenza B by PCR: NEGATIVE
SARS Coronavirus 2 by RT PCR: NEGATIVE

## 2020-12-24 LAB — LIPID PANEL
Cholesterol: 170 mg/dL (ref 0–200)
HDL: 42 mg/dL (ref >40)
LDL Cholesterol: 102 mg/dL — ABNORMAL HIGH (ref 0–99)
Total CHOL/HDL Ratio: 4 RATIO
Triglycerides: 128 mg/dL (ref <150)
VLDL: 26 mg/dL (ref 0–40)

## 2020-12-24 LAB — CBC WITH DIFFERENTIAL/PLATELET
Abs Immature Granulocytes: 0.02 10*3/uL (ref 0.00–0.07)
Basophils Absolute: 0 10*3/uL (ref 0.0–0.1)
Basophils Relative: 1 %
Eosinophils Absolute: 0 10*3/uL (ref 0.0–0.5)
Eosinophils Relative: 0 %
HCT: 46.7 % (ref 39.0–52.0)
Hemoglobin: 15.7 g/dL (ref 13.0–17.0)
Immature Granulocytes: 0 %
Lymphocytes Relative: 17 %
Lymphs Abs: 1.1 10*3/uL (ref 0.7–4.0)
MCH: 29.2 pg (ref 26.0–34.0)
MCHC: 33.6 g/dL (ref 30.0–36.0)
MCV: 87 fL (ref 80.0–100.0)
Monocytes Absolute: 0.3 10*3/uL (ref 0.1–1.0)
Monocytes Relative: 4 %
Neutro Abs: 5.1 10*3/uL (ref 1.7–7.7)
Neutrophils Relative %: 78 %
Platelets: 235 10*3/uL (ref 150–400)
RBC: 5.37 MIL/uL (ref 4.22–5.81)
RDW: 12.4 % (ref 11.5–15.5)
WBC: 6.6 10*3/uL (ref 4.0–10.5)
nRBC: 0 % (ref 0.0–0.2)

## 2020-12-24 LAB — TROPONIN I (HIGH SENSITIVITY)
Troponin I (High Sensitivity): 3 ng/L (ref <18)
Troponin I (High Sensitivity): 4 ng/L (ref <18)

## 2020-12-24 LAB — HEMOGLOBIN A1C
Hgb A1c MFr Bld: 5.6 % (ref 4.8–5.6)
Mean Plasma Glucose: 114.02 mg/dL

## 2020-12-24 LAB — PHOSPHORUS: Phosphorus: 1.7 mg/dL — ABNORMAL LOW (ref 2.5–4.6)

## 2020-12-24 LAB — BRAIN NATRIURETIC PEPTIDE: B Natriuretic Peptide: 39.6 pg/mL (ref 0.0–100.0)

## 2020-12-24 LAB — MAGNESIUM: Magnesium: 1.9 mg/dL (ref 1.7–2.4)

## 2020-12-24 LAB — TSH: TSH: 0.675 u[IU]/mL (ref 0.350–4.500)

## 2020-12-24 SURGERY — LEFT HEART CATH AND CORS/GRAFTS ANGIOGRAPHY
Anesthesia: LOCAL

## 2020-12-24 MED ORDER — SODIUM CHLORIDE 0.9% FLUSH: 3.0000 mL | INTRAVENOUS | Status: DC | PRN

## 2020-12-24 MED ORDER — HYDRALAZINE HCL 20 MG/ML IJ SOLN: 10.0000 mg | INTRAMUSCULAR | Status: AC | PRN

## 2020-12-24 MED ORDER — LIDOCAINE HCL (PF) 1 % IJ SOLN
INTRAMUSCULAR | Status: DC | PRN
  Administered 2020-12-24: 2 mL via INTRADERMAL

## 2020-12-24 MED ORDER — SODIUM CHLORIDE 0.9 % WEIGHT BASED INFUSION: 3.0000 mL/kg/h | INTRAVENOUS | Status: DC

## 2020-12-24 MED ORDER — ONDANSETRON HCL 4 MG/2ML IJ SOLN: 4.0000 mg | Freq: Four times a day (QID) | INTRAMUSCULAR | Status: DC | PRN

## 2020-12-24 MED ORDER — MIDAZOLAM HCL 2 MG/2ML IJ SOLN
INTRAMUSCULAR | Status: AC
  Filled 2020-12-24: qty 2

## 2020-12-24 MED ORDER — ATORVASTATIN CALCIUM 80 MG PO TABS
80.0000 mg | ORAL_TABLET | Freq: Every day | ORAL | Status: DC
  Administered 2020-12-25: 80 mg via ORAL
  Filled 2020-12-24: qty 1

## 2020-12-24 MED ORDER — ENOXAPARIN SODIUM 40 MG/0.4ML IJ SOSY: 40.0000 mg | PREFILLED_SYRINGE | INTRAMUSCULAR | Status: DC

## 2020-12-24 MED ORDER — FENTANYL CITRATE (PF) 100 MCG/2ML IJ SOLN
INTRAMUSCULAR | Status: AC
  Filled 2020-12-24: qty 2

## 2020-12-24 MED ORDER — ALBUTEROL SULFATE (2.5 MG/3ML) 0.083% IN NEBU: 2.5000 mg | INHALATION_SOLUTION | RESPIRATORY_TRACT | Status: DC | PRN

## 2020-12-24 MED ORDER — SODIUM CHLORIDE 0.9 % IV SOLN: INTRAVENOUS | Status: AC

## 2020-12-24 MED ORDER — NITROGLYCERIN 0.4 MG SL SUBL: 0.4000 mg | SUBLINGUAL_TABLET | SUBLINGUAL | Status: DC | PRN

## 2020-12-24 MED ORDER — HEPARIN (PORCINE) IN NACL 1000-0.9 UT/500ML-% IV SOLN
INTRAVENOUS | Status: AC
  Filled 2020-12-24: qty 1000

## 2020-12-24 MED ORDER — ISOSORBIDE MONONITRATE ER 30 MG PO TB24
15.0000 mg | ORAL_TABLET | Freq: Every day | ORAL | Status: DC
  Administered 2020-12-24 – 2020-12-25 (×2): 15 mg via ORAL
  Filled 2020-12-24 (×2): qty 1

## 2020-12-24 MED ORDER — HEPARIN SODIUM (PORCINE) 1000 UNIT/ML IJ SOLN
INTRAMUSCULAR | Status: DC | PRN
  Administered 2020-12-24: 5000 [IU] via INTRAVENOUS

## 2020-12-24 MED ORDER — IOHEXOL 350 MG/ML SOLN
INTRAVENOUS | Status: DC | PRN
  Administered 2020-12-24: 75 mL via INTRA_ARTERIAL

## 2020-12-24 MED ORDER — ASPIRIN 81 MG PO CHEW
81.0000 mg | CHEWABLE_TABLET | Freq: Every day | ORAL | Status: DC
  Administered 2020-12-25: 81 mg via ORAL
  Filled 2020-12-24: qty 1

## 2020-12-24 MED ORDER — SODIUM CHLORIDE 0.9% FLUSH
3.0000 mL | Freq: Two times a day (BID) | INTRAVENOUS | Status: DC
  Administered 2020-12-24 – 2020-12-25 (×2): 3 mL via INTRAVENOUS

## 2020-12-24 MED ORDER — ENOXAPARIN SODIUM 40 MG/0.4ML IJ SOSY
40.0000 mg | PREFILLED_SYRINGE | INTRAMUSCULAR | Status: DC
  Administered 2020-12-25: 40 mg via SUBCUTANEOUS
  Filled 2020-12-24: qty 0.4

## 2020-12-24 MED ORDER — ONDANSETRON HCL 4 MG PO TABS: 4.0000 mg | ORAL_TABLET | Freq: Four times a day (QID) | ORAL | Status: DC | PRN

## 2020-12-24 MED ORDER — SODIUM CHLORIDE 0.9 % IV SOLN: 250.0000 mL | INTRAVENOUS | Status: DC | PRN

## 2020-12-24 MED ORDER — SENNA 8.6 MG PO TABS: 1.0000 | ORAL_TABLET | Freq: Two times a day (BID) | ORAL | Status: DC

## 2020-12-24 MED ORDER — LABETALOL HCL 5 MG/ML IV SOLN: 10.0000 mg | INTRAVENOUS | Status: AC | PRN

## 2020-12-24 MED ORDER — ACETAMINOPHEN 325 MG PO TABS
650.0000 mg | ORAL_TABLET | Freq: Four times a day (QID) | ORAL | Status: DC | PRN
  Administered 2020-12-25: 650 mg via ORAL
  Filled 2020-12-24: qty 2

## 2020-12-24 MED ORDER — SODIUM CHLORIDE 0.9% FLUSH
3.0000 mL | Freq: Two times a day (BID) | INTRAVENOUS | Status: DC
  Administered 2020-12-24: 3 mL via INTRAVENOUS

## 2020-12-24 MED ORDER — ATORVASTATIN CALCIUM 40 MG PO TABS: 40.0000 mg | ORAL_TABLET | Freq: Every day | ORAL | Status: DC

## 2020-12-24 MED ORDER — PANTOPRAZOLE SODIUM 40 MG PO TBEC
40.0000 mg | DELAYED_RELEASE_TABLET | Freq: Every day | ORAL | Status: DC
  Administered 2020-12-25: 40 mg via ORAL
  Filled 2020-12-24: qty 1

## 2020-12-24 MED ORDER — ASPIRIN 81 MG PO CHEW: 81.0000 mg | CHEWABLE_TABLET | ORAL | Status: DC

## 2020-12-24 MED ORDER — ACETAMINOPHEN 650 MG RE SUPP: 650.0000 mg | Freq: Four times a day (QID) | RECTAL | Status: DC | PRN

## 2020-12-24 MED ORDER — VERAPAMIL HCL 2.5 MG/ML IV SOLN
INTRAVENOUS | Status: DC | PRN
  Administered 2020-12-24: 10 mL via INTRA_ARTERIAL

## 2020-12-24 MED ORDER — LIDOCAINE HCL (PF) 1 % IJ SOLN
INTRAMUSCULAR | Status: AC
  Filled 2020-12-24: qty 30

## 2020-12-24 MED ORDER — MIDAZOLAM HCL 2 MG/2ML IJ SOLN
INTRAMUSCULAR | Status: DC | PRN
  Administered 2020-12-24: 1 mg via INTRAVENOUS

## 2020-12-24 MED ORDER — SUCRALFATE 1 G PO TABS
1.0000 g | ORAL_TABLET | Freq: Four times a day (QID) | ORAL | Status: DC
  Administered 2020-12-24 – 2020-12-25 (×2): 1 g via ORAL
  Filled 2020-12-24 (×2): qty 1

## 2020-12-24 MED ORDER — MONTELUKAST SODIUM 10 MG PO TABS
10.0000 mg | ORAL_TABLET | Freq: Every day | ORAL | Status: DC
  Filled 2020-12-24: qty 1

## 2020-12-24 MED ORDER — SENNA 8.6 MG PO TABS: 1.0000 | ORAL_TABLET | Freq: Two times a day (BID) | ORAL | Status: DC | PRN

## 2020-12-24 MED ORDER — HEPARIN (PORCINE) IN NACL 1000-0.9 UT/500ML-% IV SOLN
INTRAVENOUS | Status: DC | PRN
  Administered 2020-12-24 (×2): 500 mL

## 2020-12-24 MED ORDER — VERAPAMIL HCL 2.5 MG/ML IV SOLN
INTRAVENOUS | Status: AC
  Filled 2020-12-24: qty 2

## 2020-12-24 MED ORDER — FENTANYL CITRATE (PF) 100 MCG/2ML IJ SOLN
INTRAMUSCULAR | Status: DC | PRN
  Administered 2020-12-24: 50 ug via INTRAVENOUS

## 2020-12-24 MED ORDER — SODIUM CHLORIDE 0.9 % WEIGHT BASED INFUSION
1.0000 mL/kg/h | INTRAVENOUS | Status: DC
  Administered 2020-12-24: 1 mL/kg/h via INTRAVENOUS

## 2020-12-24 MED ORDER — METOPROLOL SUCCINATE ER 25 MG PO TB24
12.5000 mg | ORAL_TABLET | Freq: Every day | ORAL | Status: DC
  Administered 2020-12-25: 12.5 mg via ORAL
  Filled 2020-12-24 (×2): qty 1

## 2020-12-24 SURGICAL SUPPLY — 10 items
CATH INFINITI 5 FR IM (CATHETERS) ×2 IMPLANT
CATH INFINITI 5FR MULTPACK ANG (CATHETERS) ×2 IMPLANT
DEVICE RAD COMP TR BAND LRG (VASCULAR PRODUCTS) ×2 IMPLANT
GLIDESHEATH SLEND SS 6F .021 (SHEATH) ×2 IMPLANT
KIT HEART LEFT (KITS) ×2 IMPLANT
PACK CARDIAC CATHETERIZATION (CUSTOM PROCEDURE TRAY) ×2 IMPLANT
SYR MEDRAD MARK 7 150ML (SYRINGE) ×2 IMPLANT
TRANSDUCER W/STOPCOCK (MISCELLANEOUS) ×2 IMPLANT
TUBING CIL FLEX 10 FLL-RA (TUBING) ×2 IMPLANT
WIRE HI TORQ VERSACORE J 260CM (WIRE) ×2 IMPLANT

## 2020-12-24 NOTE — ED Notes (Signed)
Stemi doctor paged per Dr. Effie Shy request

## 2020-12-24 NOTE — Interval H&P Note (Signed)
History and Physical Interval Note:  12/24/2020 2:39 PM  Chase Edwards  has presented today for surgery, with the diagnosis of unstable angina.  The various methods of treatment have been discussed with the patient and family. After consideration of risks, benefits and other options for treatment, the patient has consented to  Procedure(s): LEFT HEART CATH AND CORS/GRAFTS ANGIOGRAPHY (N/A) as a surgical intervention.  The patient's history has been reviewed, patient examined, no change in status, stable for surgery.  I have reviewed the patient's chart and labs.  Questions were answered to the patient's satisfaction.    Cath Lab Visit (complete for each Cath Lab visit)  Clinical Evaluation Leading to the Procedure:   ACS: Yes.    Non-ACS:  N/A  [name]

## 2020-12-24 NOTE — ED Notes (Signed)
Lab called for Lab add-on

## 2020-12-24 NOTE — ED Triage Notes (Signed)
Pt was at work today and was responding to a fire alarm.  Pt ran upstairs and had immediate onset of chest pain to center of chest, radiated down left arm, and became very diaphoretic.  Pt has had a total of 324mg  of basa and 2 nitro with relief of pain.  Pt has history or CABG with several stents.  CBG 144.

## 2020-12-24 NOTE — Progress Notes (Addendum)
TR BAND REMOVAL  LOCATION:    Left radial  DEFLATED PER PROTOCOL:    Yes.    TIME BAND OFF / DRESSING APPLIED:    1815pm a clean dry dressing applied with gauze and tegaderm.  Coban for support   SITE UPON ARRIVAL:    Level 0  SITE AFTER BAND REMOVAL:    Level 0  CIRCULATION SENSATION AND MOVEMENT:    Within Normal Limits   Yes.    COMMENTS:   Care instructions given to patient

## 2020-12-24 NOTE — H&P (Signed)
Cardiology Admission History and Physical:   Patient ID: Chase Edwards MRN: [id number]; DOB: [birth year]   Admission date: 12/24/2020  PCP:  No primary care provider on file.   [facility] Providers Cardiologist:  Dr. Rayetta Pigg Assar Click here to update MD or APP on Care Team, Refresh:1}     Chief Complaint: Chest pain  Patient Profile:   Chase Edwards is a 55 y.o. male with HTN, asthma, GERD, HLD and CAD (s/p multiple stenting to LAD, 1v CABG LIMA-LAD in 2018) who is being seen 12/24/2020 for the evaluation of chest pain.  History of Present Illness:   Chase Edwards is a 55 year old with a significant personal history and family history of heart disease who presented to the ED for evaluation of chest pain.  Patient works as a Production designer, theatre/television/film man and reported having having dizziness and diaphoresis after responding to a fire alarm on the third floor of his workplace.  He said after few minutes he started experiencing chest pain that radiated to his left arm.  He took 1 nitroglycerin without significant relief and took a second nitro which completely resolved his pain.   Of note, patient reports an episode of passing out while playing baseball at the age of 55.  In the age of 55, he had an episode of angina.  In 2013, he had another chest pain requiring stent placement in his LAD.  In 2014, he had in-stent restenosis and 2 additional stents were placed in his LAD. In 2018, patient collapsed while at home and was found to have significant blockage of his LAD requiring 1v CABG (LIMA-LAD). Patient required surgery for uncontrolled bleeding after CABG.  Since then, he has had stable angina pectoris with chest pain about 1-2 times a month that usually resolves with with nitroglycerin.   Patient was recently hospitalized for chest pain 08/18/2020.  Troponins were normal within normal limits.  Lexiscan was negative.  Patient was discharged to follow-up with his cardiologist.   Past Medical History:  Diagnosis Date    Asthma    Coronary artery disease    GERD (gastroesophageal reflux disease)    Hypertension     Past Surgical History:  Procedure Laterality Date   APPENDECTOMY     CORONARY ARTERY BYPASS GRAFT     CORONARY STENT INTERVENTION       Medications Prior to Admission: Prior to Admission medications   Not on File     Allergies:    Allergies  Allergen Reactions   Phenergan [Promethazine]     Seizures and vomiting    Social History:   Social History   Socioeconomic History   Marital status: Single    Spouse name: Not on file   Number of children: Not on file   Years of education: Not on file   Highest education level: Not on file  Occupational History   Not on file  Tobacco Use   Smoking status: Never    Passive exposure: Never   Smokeless tobacco: Never  Substance and Sexual Activity   Alcohol use: Not Currently   Drug use: Never   Sexual activity: Not on file  Other Topics Concern   Not on file  Social History Narrative   Not on file   Social Determinants of Health   Financial Resource Strain: Not on file  Food Insecurity: Not on file  Transportation Needs: Not on file  Physical Activity: Not on file  Stress: Not on file  Social Connections: Not on file  Intimate Partner  Violence: Not on file    Family History:   Patient has a significant family history of heart disease.  His maternal grandmother passed away from MI at age 86.  His aunt passed away from MI at the age of 73.  Mother has a history of heart disease and diabetes.  Brother has a history of diabetes.  ROS:  Please see the history of present illness.  All other ROS reviewed and negative.     Physical Exam/Data:   Vitals:   12/24/20 0949  BP: 121/80  Pulse: 84  Resp: 20  Temp: 99 F (37.2 C)  TempSrc: Oral  SpO2: 96%  Weight: 117 kg  Height: 6\' 2"  (1.88 m)   No intake or output data in the 24 hours ending 12/24/20 1108 Last 3 Weights 12/24/2020  Weight (lbs) 258 lb  Weight (kg)  117.028 kg     Body mass index is 33.13 kg/m.  General:  Well nourished, well developed, in no acute distress HEENT: normal Lymph: no adenopathy Neck: no JVD.  Supple Endocrine:  No thryomegaly Vascular: No carotid bruits; FA pulses 2+ bilaterally without bruits  Cardiac:  normal S1, S2; RRR; no M/R/G.  Healed and nontender surgical scar on chest wall. Lungs:  clear to auscultation bilaterally, no wheezing, rhonchi or rales  Abd: soft, nontender, no hepatomegaly  Ext: no edema Musculoskeletal:  No deformities, BUE and BLE strength normal and equal Skin: warm and dry  Neuro:  CNs 2-12 intact, no focal abnormalities noted Psych:  Normal affect    EKG:  The ECG that was done 7/7 was personally reviewed and demonstrates normal sinus rhythm with less than 1 mm ST elevation in lead II and aVF.   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  Intermediate risk.  Laboratory Data:  High Sensitivity Troponin:   Recent Labs  Lab 12/24/20 1014  TROPONINIHS 3      Chemistry Recent Labs  Lab 12/24/20 1014  NA 137  K 3.9  CL 107  CO2 24  GLUCOSE 127*  BUN 20  CREATININE 1.02  CALCIUM 8.8*  GFRNONAA >60  ANIONGAP 6    No results for input(s): PROT, ALBUMIN, AST, ALT, ALKPHOS, BILITOT in the last 168 hours. Hematology Recent Labs  Lab 12/24/20 1014  WBC 6.6  RBC 5.37  HGB 15.7  HCT 46.7  MCV 87.0  MCH 29.2  MCHC 33.6  RDW 12.4  PLT 235   BNPNo results for input(s): BNP, PROBNP in the last 168 hours.  DDimer No results for input(s): DDIMER in the last 168 hours.   Radiology/Studies:  DG Chest Port 1 View  Result Date: 12/24/2020 CLINICAL DATA:  Chest pain EXAM: PORTABLE CHEST 1 VIEW COMPARISON:  08/19/2020 FINDINGS: Status post median sternotomy and CABG procedure. Coronary artery stent noted along the left  heart border. Chronic pleural and parenchymal scarring with blunting of the left costophrenic angle. No airspace opacification identified. No pleural effusion or edema. IMPRESSION: No acute cardiopulmonary abnormalities. Electronically Signed   By: 10/19/2020 M.D.   On: 12/24/2020 10:42     Assessment and Plan:  Ho Parisi is a 55 y.o. male with HTN, asthma, GERD, HLD and CAD (s/p multiple stenting to LAD, 1v CABG LIMA-LAD in 2018) who is being seen 12/24/2020 for the evaluation of chest pain.  Chest pain, CAD s/p one-vessel CABG (LIMA-LAD): Patient  with significant cardiac history here for evaluation of chest pain has not resolved with nitro.  He has had intermittent chest pain since his CABG 4 years ago.  EKG showed <1 mm ST elevation in lead II and aVF. First troponin within normal limits.  Chest pain has now resolved.  Will consider left heart cath to rule out further coronary disease.  --Follow-up second troponin  --Left heart cath today  --Continue home metoprolol 12.5 mg daily  -- As needed nitro 0.4 mg for chest pain  -- Outpatient follow-up with cardiologist  Hypertension: Patient found to be normotensive on admission. SBP 110s to 120s.  Continue home metoprolol 12.5 mg daily.  Hyperlipidemia: Follow-up repeat lipid panel. Continue Lipitor 40 mg daily. Consider increasing to 80 mg daily. Asthma: Continue home albuterol, Singulair and Advair Diskus GERD: Resume home Protonix 40 mg daily   Risk Assessment/Risk Scores:    HEAR Score (for undifferentiated chest pain):        Severity of Illness: The appropriate patient status for this patient is OBSERVATION. Observation status is judged to be reasonable and necessary in order to provide the required intensity of service to ensure the patient's safety. The patient's presenting symptoms, physical exam findings, and initial radiographic and laboratory data in the context of their medical condition is felt to place them at decreased risk  for further clinical deterioration. Furthermore, it is anticipated that the patient will be medically stable for discharge from the hospital within 2 midnights of admission. The following factors support the patient status of observation.   " The patient's presenting symptoms include chest pain has now resolved. " The physical exam findings include NAD, RRR, lungs CTAB, palpable distal pulses " The initial radiographic and laboratory data are normal chest x-ray, EKG unremarkable, troponins pending.   For questions or updates, please contact [facility] Please consult [email or web address] for contact info under     Signed, Steffanie Rainwater, MD  12/24/2020 11:08 AM    As above, patient seen and examined.  Briefly he is a 55 year old male with past medical history of hypertension, hyperlipidemia, asthma, gastroesophageal reflux disease, coronary artery disease status post coronary artery bypass graft for evaluation of chest pain.  Patient has had previous PCI.  Ultimately he underwent coronary artery bypass and graft in 2018 in [place].  He apparently had a follow-up cardiac catheterization in [place] in 2019 due to chest pain which he states showed patent graft.  I do not have those results available.  In March 2022 the patient had an echocardiogram showing normal LV function, mild left atrial enlargement and mild mitral regurgitation.  Nuclear study showed apical infarct but no ischemia; EF 54.  Patient has had intermittent chest pain since his coronary artery bypass graft.  Today at work he ran upstairs and developed substernal chest pressure radiating to his neck, left shoulder and back.  There was associated nausea, dyspnea and diaphoresis.  His pain eventually resolved after 15 minutes with sublingual nitroglycerin x2.  He presented to the emergency room for further evaluation.  Presently pain-free.  Laboratories show LDL 102, creatinine 1.02, hemoglobin 15.7 and chest x-ray without acute  infiltrate.  Electrocardiogram shows sinus rhythm, left posterior fascicular block, subtle inferior lateral ST elevation.  Troponin 3.  1 chest pain-symptoms sound somewhat chronic.  Electrocardiogram without diagnostic ST changes and initial troponin normal.  We will continue to cycle enzymes.  He did have a recent nuclear study showing no ischemia but he continues to have intermittent  symptoms and therefore we will proceed with cardiac catheterization for definitive evaluation.  The risks and benefits including myocardial infarction, CVA and death discussed and he agrees to proceed.  2 hypertension-continue preadmission metoprolol and follow.  3 hyperlipidemia-continue Lipitor.  Treat with 80 mg daily.  Check lipids and liver in 12 weeks and if LDL not at goal we will add Zetia 10 mg daily.  Olga MillersBrian Dreshon Proffit, MD

## 2020-12-24 NOTE — Significant Event (Addendum)
[facility] STEMI Evaluation  Date: 12/24/20  Time: 10:32 AM  I was contacted by Dr. Effie Shy to evaluate Mr. Rosello's EKG.  He presented to the ED with CP that began after responding to a fire alarm this morning.  Pain improved with SL NTG and has now completely resolved after being transported to the ED by EMS.  He has a h/o CAD with prior PCI's to the LAD and subsequent single-vessel CABG (LIMA-LAD).  Initial EKG today has baseline wander with subtle ST elevation in the inferior leads.  Repeat tracing shows NSR with nonspecific ST changes with less than 1 mm ST elevation in leads II and aVF.  Notably, the patient was seen at [facility] in 08/2020 for chest pain, at which time echo showed normal LVEF w/o wall motion abnormality.  Stress test was intermediate risk with fixed inferior defect.  Patient's symptoms and subtle EKG changes (no prior tracing available for comparison) are concerning for ACS.  However, as he is currently chest pain free and EKG's do not meet STEMI criteria, I recommend deferring emergent cardiac catheterization in favor of further evaluation in the ED.  Formal cardiology consultation to follow.  Findings and recommendations were discussed with Dr. Effie Shy by phone.  Yvonne Kendall, MD [facility]

## 2020-12-24 NOTE — ED Provider Notes (Signed)
[facility] EMERGENCY DEPARTMENT Provider Note   CSN: [id number] Arrival date & time: 12/24/20  6568     History Chief Complaint  Patient presents with   Chest Pain    Chase Edwards is a 55 y.o. male.  HPI Patient presents by EMS for evaluation of chest pain.  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  Pain radiated to his left arm, and left shoulder and he had diaphoresis.  He took a nitroglycerin and then walked back to his office.  He had persistent pain, so took a second nitroglycerin.  Initially the pain was 9/10, then resolved to 1/10 after the second nitroglycerin.  It has subsequently completely resolved.  He has no residual diaphoresis or shortness of breath.  He was treated with aspirin by EMS.  Last hospitalized for same on 08/18/2020.  At that time he had a negative Lexiscan.  He was discharged on beta-blocker.  He was to follow-up with cardiology as an outpatient.  He has a history of single-vessel coronary artery bypass, done many years ago in [place].  He states he followed up with cardiology about a month ago and was stable, being treated with metoprolol and daily baby aspirin.  He last used nitroglycerin about a month ago.  Prior episodes of chest pain have been exertional related.  He denies recent illnesses.  There are no other known active modifying factors.    Past Medical History:  Diagnosis Date   Asthma    Coronary artery disease    GERD (gastroesophageal reflux disease)    Hypertension     There are no problems to display for this patient.   Past Surgical History:  Procedure Laterality Date   APPENDECTOMY     CORONARY ARTERY BYPASS GRAFT     CORONARY STENT INTERVENTION         No family history on file.  Social History   Tobacco Use   Smoking status: Never    Passive exposure: Never   Smokeless tobacco: Never  Substance Use Topics   Alcohol use: Not Currently   Drug use: Never     Home Medications Prior to Admission medications   Not on File    Allergies    Phenergan [promethazine]  Review of Systems   Review of Systems  All other systems reviewed and are negative.  Physical Exam Updated Vital Signs BP 121/80 (BP Location: Right Arm)   Pulse 84   Temp 99 F (37.2 C) (Oral)   Resp 20   Ht 6\' 2"  (1.88 m)   Wt 117 kg   SpO2 96%   BMI 33.13 kg/m   Physical Exam Vitals and nursing note reviewed.  Constitutional:      Appearance: He is well-developed. He is not ill-appearing.  HENT:     Head: Normocephalic and atraumatic.     Right Ear: External ear normal.     Left Ear: External ear normal.  Eyes:     Conjunctiva/sclera: Conjunctivae normal.     Pupils: Pupils are equal, round, and reactive to light.  Neck:     Trachea: Phonation normal.  Cardiovascular:     Rate and Rhythm: Normal rate and regular rhythm.     Heart sounds: Normal heart sounds.  Pulmonary:     Effort: Pulmonary effort is normal.     Breath sounds: Normal breath sounds.  Chest:     Chest wall: No tenderness.  Abdominal:  General: There is no distension.     Palpations: Abdomen is soft.     Tenderness: There is no abdominal tenderness.  Musculoskeletal:        General: Normal range of motion.     Cervical back: Normal range of motion and neck supple.  Skin:    General: Skin is warm and dry.  Neurological:     Mental Status: He is alert and oriented to person, place, and time.     Cranial Nerves: No cranial nerve deficit.     Sensory: No sensory deficit.     Motor: No abnormal muscle tone.     Coordination: Coordination normal.  Psychiatric:        Mood and Affect: Mood normal.        Behavior: Behavior normal.        Thought Content: Thought content normal.        Judgment: Judgment normal.    ED Results / Procedures / Treatments   Labs (all labs ordered are listed, but only abnormal results are displayed) Labs Reviewed  BASIC METABOLIC PANEL  CBC WITH  DIFFERENTIAL/PLATELET  TROPONIN I (HIGH SENSITIVITY)    EKG EKG Interpretation  Date/Time:  Thursday December 24 2020 10:27:37 EDT Ventricular Rate:  72 PR Interval:  182 QRS Duration: 99 QT Interval:  370 QTC Calculation: 405 R Axis:   122 Text Interpretation: Sinus rhythm Left posterior fascicular block ST elev, probable normal early repol pattern Since last tracing of earlier today No significant change was found Confirmed by Mancel Bale [id number]) on 12/24/2020 10:35:03 AM  Radiology No results found.  Procedures .Critical Care  Date/Time: 12/26/2020 1:25 PM Performed by: Mancel Bale, MD Authorized by: Mancel Bale, MD   Critical care provider statement:    Critical care time (minutes):  35   Critical care start time:  12/24/2020 9:45 AM   Critical care end time:  12/24/2020 2:30 PM   Critical care time was exclusive of:  Separately billable procedures and treating other patients   Critical care was necessary to treat or prevent imminent or life-threatening deterioration of the following conditions:  Cardiac failure   Critical care was time spent personally by me on the following activities:  Blood draw for specimens, development of treatment plan with patient or surrogate, discussions with consultants, evaluation of patient's response to treatment, examination of patient, obtaining history from patient or surrogate, ordering and performing treatments and interventions, ordering and review of laboratory studies, pulse oximetry, re-evaluation of patient's condition, review of old charts and ordering and review of radiographic studies   Medications Ordered in ED Medications - No data to display  ED Course  I have reviewed the triage vital signs and the nursing notes.  Pertinent labs & imaging results that were available during my care of the patient were reviewed by me and considered in my medical decision making (see chart for details).  Clinical Course as of 12/26/20 1313  Thu  Dec 24, 2020  1026 Initial EKG reviewed with and discussed with cardiology, Dr. Okey Dupre.  Because of baseline artifact, and baseline wander, will repeat then reassess. [EW]  1032 Second discussion with Dr. Okey Dupre, regarding repeat EKG; we will continue to observe check troponins, and his service will evaluate the patient in the ED.  Patient remains chest pain-free at this time. [EW]    Clinical Course User Index [EW] Mancel Bale, MD   MDM Rules/Calculators/A&P  Patient Vitals for the past 24 hrs:  BP Temp Temp src Pulse Resp SpO2 Height Weight  12/24/20 0949 121/80 99 F (37.2 C) Oral 84 20 96 % 6\' 2"  (1.88 m) 117 kg    2:15 PM reevaluation with update and discussion. After initial assessment and treatment, an updated evaluation reveals he remains chest pain-free.   Medical Decision Making:  This patient is presenting for evaluation of an episode of chest pain, which does require a range of treatment options, and is a complaint that involves a moderate risk of morbidity and mortality. The differential diagnoses include deconditioning, ACS, nonspecific musculoskeletal pain. I decided to review old records, and in summary middle-aged male with history of coronary bypass, recently had restratification with Lexiscan, presenting with an episode of chest pain, resolved after self treatment with nitroglycerin.  He has a single-vessel cardiac bypass done remotely..  I did not require additional historical information from anyone.  Clinical Laboratory Tests Ordered, included CBC, Metabolic panel, and delta troponin . Review indicates screening labs are reassuring.  Delta troponin negative.  Screening labs including COVID and flu test are negative. Radiologic Tests Ordered, included chest x-ray.  I independently Visualized: Chest x-ray images, which show no infiltrate or edema  Cardiac Monitor Tracing which shows normal sinus rhythm   Critical  Interventions-clinical evaluation, laboratory testing, chest x-ray, observation and reassessment  After These Interventions, the Patient was reevaluated and was found with findings consistent with unstable angina.  Patient stable during ED evaluation, not requiring intervention or resuscitation.  Consultation with cardiology, start admit the patient for further evaluation and semiurgent cardiac catheterization.  CRITICAL CARE-yes Performed by: Mancel Bale  Nursing Notes Reviewed/ Care Coordinated Applicable Imaging Reviewed Interpretation of Laboratory Data incorporated into ED treatment   Plan admit to cardiology    Final Clinical Impression(s) / ED Diagnoses Final diagnoses:  Unstable angina [facility])    Rx / DC Orders ED Discharge Orders     None        [facility], MD 12/26/20 1328

## 2020-12-24 NOTE — ED Notes (Signed)
Consent has been sent up to the cath-lab with patient.

## 2020-12-25 ENCOUNTER — Encounter (HOSPITAL_COMMUNITY): Payer: Self-pay | Admitting: Internal Medicine

## 2020-12-25 DIAGNOSIS — I2510 Atherosclerotic heart disease of native coronary artery without angina pectoris: Secondary | ICD-10-CM

## 2020-12-25 DIAGNOSIS — E785 Hyperlipidemia, unspecified: Secondary | ICD-10-CM

## 2020-12-25 DIAGNOSIS — I200 Unstable angina: Secondary | ICD-10-CM

## 2020-12-25 LAB — BASIC METABOLIC PANEL
Anion gap: 8 (ref 5–15)
BUN: 17 mg/dL (ref 6–20)
CO2: 23 mmol/L (ref 22–32)
Calcium: 8.3 mg/dL — ABNORMAL LOW (ref 8.9–10.3)
Chloride: 105 mmol/L (ref 98–111)
Creatinine, Ser: 0.89 mg/dL (ref 0.61–1.24)
GFR, Estimated: >60 mL/min (ref >60)
Glucose, Bld: 103 mg/dL — ABNORMAL HIGH (ref 70–99)
Potassium: 3.7 mmol/L (ref 3.5–5.1)
Sodium: 136 mmol/L (ref 135–145)

## 2020-12-25 LAB — CBC
HCT: 44.3 % (ref 39.0–52.0)
Hemoglobin: 15.4 g/dL (ref 13.0–17.0)
MCH: 29.3 pg (ref 26.0–34.0)
MCHC: 34.8 g/dL (ref 30.0–36.0)
MCV: 84.4 fL (ref 80.0–100.0)
Platelets: 235 10*3/uL (ref 150–400)
RBC: 5.25 MIL/uL (ref 4.22–5.81)
RDW: 12.1 % (ref 11.5–15.5)
WBC: 11.5 10*3/uL — ABNORMAL HIGH (ref 4.0–10.5)
nRBC: 0 % (ref 0.0–0.2)

## 2020-12-25 LAB — HIV ANTIBODY (ROUTINE TESTING W REFLEX): HIV Screen 4th Generation wRfx: NONREACTIVE

## 2020-12-25 MED ORDER — ISOSORBIDE MONONITRATE ER 30 MG PO TB24: 15.0000 mg | ORAL_TABLET | Freq: Every day | ORAL | 1 refills | Status: DC

## 2020-12-25 MED ORDER — ASPIRIN 81 MG PO CHEW: 81.0000 mg | CHEWABLE_TABLET | Freq: Every day | ORAL | 1 refills | Status: AC

## 2020-12-25 MED ORDER — PANTOPRAZOLE SODIUM 40 MG PO TBEC: 40.0000 mg | DELAYED_RELEASE_TABLET | Freq: Every day | ORAL | 1 refills | Status: DC

## 2020-12-25 MED ORDER — ATORVASTATIN CALCIUM 80 MG PO TABS: 80.0000 mg | ORAL_TABLET | Freq: Every day | ORAL | 1 refills | Status: DC

## 2020-12-25 NOTE — TOC Transition Note (Addendum)
Transition of Care [facility]) - CM/SW Discharge Note   Patient Details  Name: Chase Edwards MRN: [id number] Date of Birth: [birth year]  Transition of Care [facility] CM/SW Contact:  Leone Haven, RN Phone Number: 12/25/2020, 9:55 AM   Clinical Narrative:    NCM spoke with patient regarding insurance, he states he has insurance with [employer] but he does not have his information with him.  He states if you give [employer] his social they should be able to pull up the information.  His social is listed in act. He states he is able to afford his meds , he has no issues.  This NCM contacted Admissions and gave them this information.    Final next level of care: Home/Self Care Barriers to Discharge: No Barriers Identified   Patient Goals and CMS Choice Patient states their goals for this hospitalization and ongoing recovery are:: return home   Choice offered to / list presented to : NA  Discharge Placement                       Discharge Plan and Services                  DME Agency: NA       HH Arranged: NA          Social Determinants of Health (SDOH) Interventions     Readmission Risk Interventions No flowsheet data found.

## 2020-12-25 NOTE — Progress Notes (Signed)
Patient's HR drops to as low as 48 when sleeping and remains sinus brady (48-50s). Upon awakening the patient, HR goes back up to 60s-70s, patient asymptomatic. On call MD paged about it. No new orders received.

## 2020-12-25 NOTE — Discharge Summary (Addendum)
Discharge Summary    Patient ID: Chase Edwards MRN: [id number]; DOB: 03/22/1966  Admit date: 12/24/2020 Discharge date: 12/25/2020  PCP:  [facility]   [facility] Providers Cardiologist:  Dr. Sharrell Ku in [facility]  Discharge Diagnoses    Principal Problem:   Unstable angina [facility]) Active Problems:   CAD (coronary artery disease)   HLD (hyperlipidemia)    Diagnostic Studies/Procedures    Left heart cath on 12/24/20:  Conclusions: Severe single-vessel coronary artery disease with chronic total occlusion of mid LAD stent. No significant CAD involving the LCx and RCA. Widely patent LIMA-LAD supplying distal LAD. Normal left ventricular systolic function with mildly elevated filling pressure.   Recommendations: No culprit lesion identified to explain the patient's chest pain.  Will add isosorbide mononitrate for antianginal therapy. Continue secondary prevention of coronary artery disease.  Diagnostic Dominance: Right    Intervention  _____________   History of Present Illness     Chase Edwards is a 55 year old with a significant personal history and family history of heart disease who presented to the ED on 12/24/20 for evaluation of chest pain.  Patient works as a Production designer, theatre/television/film man and reported having having dizziness and diaphoresis after responding to a fire alarm on the third floor of his workplace.  He said after few minutes he started experiencing chest pain that radiated to his left arm.  He took 1 nitroglycerin without significant relief and took a second nitro which completely resolved his pain.   Of note, patient reported an episode of passing out while playing baseball at the age of 55.  In the age of 62, he had an episode of angina.  In 2013, he had another chest pain requiring stent placement in his LAD.  In 2014, he had in-stent restenosis and 2 additional stents were placed in his LAD. In 2018, patient collapsed while at home and was found to have significant  blockage of his LAD requiring 1v CABG (LIMA-LAD). Patient required surgery for uncontrolled bleeding after CABG.  Since then, he has had stable angina pectoris with chest pain about 1-2 times a month that usually resolves with with nitroglycerin.   Patient was recently hospitalized for chest pain 08/18/2020.  Troponins were normal within normal limits.  Lexiscan was negative.  Patient was discharged to follow-up with his cardiologist.    Hospital Course     Consultants: N/A  Chest pain CAD s/p PCI and CABG in 2018 at [place]  - presented with chronic chest pain since CABG - HS trop 3 >4 - EKG sinus rhythm, left posterior fascicular block, subtle inferior lateral ST elevation - s/p cardiac cath 12/24/20: Severe single-vessel CAD with chronic total occlusion of mid LAD stent. No significant CAD involving the LCx and RCA. Widely patent LIMA-LAD supplying distal LAD. Normal LV systolic function with mildly elevated filling pressure. - chest pain has resolved, telemetry with nocturnal bradycardia 55s without symptoms  - recommend continue medical therapy with ASA 81mg , lipitor 80mg , metoprolol 12.5mg  BID; added Imdur 15mg  daily for anti-anginal therapy  - follow up with cardiologist in [facility] in 3 weeks, patient prefers and will call for appointment  - radial care discussed   HTN - BP controlled, continue metoprolol and Imdur   HLD  - LDL 102 on 12/24/20, increased lipitor to 80mg  daily, repeat lipids and LFTs  in 3 month and if LDL not at goal will add Zetia 10 mg daily.  Asthma - continued home med PRN albuterol, follow up PCP  GERD -  continue home med Carafate, will add Protonix 40mg  daily, follow up PCP    Did the patient have an acute coronary syndrome (MI, NSTEMI, STEMI, etc) this admission?:  No                               Did the patient have a percutaneous coronary intervention (stent / angioplasty)?:  No.       _____________  Discharge Vitals Blood pressure 121/74,  pulse 68, temperature 98 F (36.7 C), temperature source Oral, resp. rate 16, height 6\' 2"  (1.88 m), weight 117 kg, SpO2 96 %.  Filed Weights   12/24/20 0949  Weight: 117 kg   Vitals:  Vitals:   12/25/20 0325 12/25/20 0732  BP: 101/69 121/74  Pulse: 66 68  Resp: 14 16  Temp: 97.7 F (36.5 C) 98 F (36.7 C)  SpO2: 96% 96%   General Appearance: In no apparent distress, laying in bed HEENT: Normocephalic, atraumatic. EOMs intact.  Neck: Supple, trachea midline, no JVDs Cardiovascular: Regular rate and rhythm, normal S1-S2,  no murmur/rub/gallop Respiratory: Resting breathing unlabored, lungs sounds clear to auscultation bilaterally, no use of accessory muscles. On room air.  No wheezes, rales or rhonchi.   Gastrointestinal: Bowel sounds positive, abdomen soft, non-tender, non-distended.  Extremities: Able to move all extremities in bed without difficulty, no edema/cyanosis/clubbing Genitourinary: genital exam not performed Musculoskeletal: Normal muscle bulk and tone, muscle strength 5/5 throughout, no limited range of motion Skin: Intact, warm, dry. No rashes or petechiae noted in exposed areas.  Neurologic: Alert, oriented to person, place and time. Fluent speech,  no cognitive deficit, no gross focal neuro deficit Psychiatric: Normal affect. Mood is appropriate.  Left radial site with dressing in place, no bleeding/hematoma/bruise, radial pulse +, left hand warm to touch, no motor deficit       Labs & Radiologic Studies    CBC Recent Labs    12/24/20 1014 12/25/20 0057  WBC 6.6 11.5*  NEUTROABS 5.1  --   HGB 15.7 15.4  HCT 46.7 44.3  MCV 87.0 84.4  PLT 235 235   Basic Metabolic Panel Recent Labs    02/24/21 1014 12/25/20 0057  NA 137 136  K 3.9 3.7  CL 107 105  CO2 24 23  GLUCOSE 127* 103*  BUN 20 17  CREATININE 1.02 0.89  CALCIUM 8.8* 8.3*  MG 1.9  --   PHOS 1.7*  --    Liver Function Tests No results for input(s): AST, ALT, ALKPHOS, BILITOT, PROT,  ALBUMIN in the last 72 hours. No results for input(s): LIPASE, AMYLASE in the last 72 hours. High Sensitivity Troponin:   Recent Labs  Lab 12/24/20 1014 12/24/20 1213  TROPONINIHS 3 4    BNP Invalid input(s): POCBNP D-Dimer No results for input(s): DDIMER in the last 72 hours. Hemoglobin A1C Recent Labs    12/24/20 1014  HGBA1C 5.6   Fasting Lipid Panel Recent Labs    12/24/20 1014  CHOL 170  HDL 42  LDLCALC 102*  TRIG 128  CHOLHDL 4.0   Thyroid Function Tests Recent Labs    12/24/20 1215  TSH 0.675   _____________  CARDIAC CATHETERIZATION  Result Date: 12/24/2020 Conclusions: 1. Severe single-vessel coronary artery disease with chronic total occlusion of mid LAD stent. 2. No significant CAD involving the LCx and RCA. 3. Widely patent LIMA-LAD supplying distal LAD. 4. Normal left ventricular systolic function with mildly elevated filling pressure. Recommendations:  1. No culprit lesion identified to explain the patient's chest pain.  Will add isosorbide mononitrate for antianginal therapy. 2. Continue secondary prevention of coronary artery disease. Yvonne Kendall, MD [facility]   DG Chest Port 1 View  Result Date: 12/24/2020 CLINICAL DATA:  Chest pain EXAM: PORTABLE CHEST 1 VIEW COMPARISON:  08/19/2020 FINDINGS: Status post median sternotomy and CABG procedure. Coronary artery stent noted along the left heart border. Chronic pleural and parenchymal scarring with blunting of the left costophrenic angle. No airspace opacification identified. No pleural effusion or edema. IMPRESSION: No acute cardiopulmonary abnormalities. Electronically Signed   By: Signa Kell M.D.   On: 12/24/2020 10:42   Disposition   Patient states chest pain never re-occurred in the hospital. He is feeling well. He is tired because did not sleep enough last night due to noise. He denied any edema, SOB, dizziness. He prefer to follow up with his cardiologist at [facility]. Pt is being discharged home  today in good condition.  Follow-up Plans & Appointments    Follow up with your cardiologist in [facility]  Discharge Medications     Allergies as of 12/25/2020       Reactions   Phenergan [promethazine]    Seizures and vomiting        Medication List     TAKE these medications    albuterol 108 (90 Base) MCG/ACT inhaler Commonly known as: VENTOLIN HFA Inhale 1 puff into the lungs every 6 (six) hours as needed for shortness of breath.   aspirin 81 MG chewable tablet Chew 1 tablet (81 mg total) by mouth daily.   atorvastatin 80 MG tablet Commonly known as: LIPITOR Take 1 tablet (80 mg total) by mouth daily. What changed:  medication strength how much to take   isosorbide mononitrate 30 MG 24 hr tablet Commonly known as: IMDUR Take 0.5 tablets (15 mg total) by mouth daily.   metoprolol succinate 25 MG 24 hr tablet Commonly known as: TOPROL-XL Take 12.5 mg by mouth daily.   nitroGLYCERIN 0.4 MG SL tablet Commonly known as: NITROSTAT Place 0.4 mg under the tongue every 5 (five) minutes as needed for chest pain.   pantoprazole 40 MG tablet Commonly known as: PROTONIX Take 1 tablet (40 mg total) by mouth daily.   predniSONE 50 MG tablet Commonly known as: DELTASONE Take 50 mg by mouth daily.   sucralfate 1 g tablet Commonly known as: CARAFATE Take 1 g by mouth 4 (four) times daily.           Outstanding Labs/Studies   Repeat lipid and CMP in 3 month    Duration of Discharge Encounter   Greater than 30 minutes including physician time.  Signed, Cyndi Bender, NP 12/25/2020, 9:06 AM  As above, patient seen and examined.  He denies chest pain or dyspnea.  Radial cath site without hematoma.  Cardiac catheterization revealed patent LIMA to the LAD and no obstructive disease in the circumflex or right coronary artery.  Enzymes negative.  Patient will be discharged on medical therapy.  We will plan to discharge on aspirin, statin, beta-blocker and isosorbide.   Patient lives in [place] and we will arrange follow-up there with his cardiologist.  We will have him seen in 2 to 4 weeks.   Greater than 30 minutes PA and physician time. D2 Cyndi Bender, NP

## 2021-01-25 ENCOUNTER — Other Ambulatory Visit: Payer: Self-pay

## 2021-01-25 ENCOUNTER — Other Ambulatory Visit: Payer: Self-pay | Admitting: Occupational Medicine

## 2021-01-25 ENCOUNTER — Ambulatory Visit: Payer: Self-pay

## 2021-01-25 DIAGNOSIS — M25511 Pain in right shoulder: Secondary | ICD-10-CM

## 2021-02-16 DIAGNOSIS — J4520 Mild intermittent asthma, uncomplicated: Secondary | ICD-10-CM | POA: Diagnosis present

## 2021-04-15 ENCOUNTER — Ambulatory Visit
Admission: EM | Admit: 2021-04-15 | Discharge: 2021-04-15 | Disposition: A | Discharge status: Home / Self Care | Payer: BLUE CROSS/BLUE SHIELD

## 2021-04-15 ENCOUNTER — Other Ambulatory Visit: Payer: Self-pay

## 2021-04-15 ENCOUNTER — Encounter: Payer: Self-pay | Admitting: Emergency Medicine

## 2021-04-15 DIAGNOSIS — H6691 Otitis media, unspecified, right ear: Secondary | ICD-10-CM

## 2021-04-15 MED ORDER — AMOXICILLIN 500 MG PO CAPS: 500.0000 mg | ORAL_CAPSULE | Freq: Three times a day (TID) | ORAL | 0 refills | Status: DC

## 2021-04-15 NOTE — ED Triage Notes (Signed)
Patient c/o RT ear pain that started this morning.  Patient endorses difficulty hearing.   Patient endorses having an allergic reaction recently to a change in soaps.   Patient attempted to wash out ear this morning with worsening of symptoms.

## 2021-04-15 NOTE — ED Provider Notes (Signed)
[facility]    CSN: [id number] Arrival date & time: 04/15/21  1441      History   Chief Complaint Chief Complaint  Patient presents with   Otalgia    HPI Chase Edwards is a 55 y.o. male.   The history is provided by the patient. No language interpreter was used.  Otalgia Location:  Right Behind ear:  No abnormality Quality:  Aching Severity:  Moderate Onset quality:  Gradual Duration:  3 days Timing:  Constant Chronicity:  New Relieved by:  Nothing Worsened by:  Nothing Ineffective treatments:  None tried Associated symptoms: no fever and no rhinorrhea    Past Medical History:  Diagnosis Date   Asthma    Coronary artery disease    GERD (gastroesophageal reflux disease)    Hypertension     Patient Active Problem List   Diagnosis Date Noted   CAD (coronary artery disease) 12/25/2020   HLD (hyperlipidemia) 12/25/2020   Unstable angina (HCC) 12/24/2020    Past Surgical History:  Procedure Laterality Date   APPENDECTOMY     CORONARY ARTERY BYPASS GRAFT     CORONARY STENT INTERVENTION     LEFT HEART CATH AND CORS/GRAFTS ANGIOGRAPHY N/A 12/24/2020   Procedure: LEFT HEART CATH AND CORS/GRAFTS ANGIOGRAPHY;  Surgeon: Yvonne Kendall, MD;  Location: [facility];  Service: Cardiovascular;  Laterality: N/A;       Home Medications    Prior to Admission medications   Medication Sig Start Date End Date Taking? Authorizing Provider  amoxicillin (AMOXIL) 500 MG capsule Take 1 capsule (500 mg total) by mouth 3 (three) times daily. 04/15/21  Yes Elson Areas, PA-C  aspirin 81 MG chewable tablet Chew 1 tablet (81 mg total) by mouth daily. 12/25/20  Yes Cyndi Bender, NP  metoprolol succinate (TOPROL-XL) 25 MG 24 hr tablet Take 12.5 mg by mouth daily. 12/21/20  Yes [provider]  pravastatin (PRAVACHOL) 80 MG tablet Take 80 mg by mouth daily as needed. 03/09/21  Yes [provider]  albuterol (VENTOLIN HFA) 108 (90 Base) MCG/ACT inhaler  Inhale 1 puff into the lungs every 6 (six) hours as needed for shortness of breath. 07/21/20   [provider]  atorvastatin (LIPITOR) 80 MG tablet Take 1 tablet (80 mg total) by mouth daily. 12/25/20   Cyndi Bender, NP  isosorbide mononitrate (IMDUR) 30 MG 24 hr tablet Take 0.5 tablets (15 mg total) by mouth daily. 12/25/20   Cyndi Bender, NP  nitroGLYCERIN (NITROSTAT) 0.4 MG SL tablet Place 0.4 mg under the tongue every 5 (five) minutes as needed for chest pain. 12/22/20   [provider]  pantoprazole (PROTONIX) 40 MG tablet Take 1 tablet (40 mg total) by mouth daily. 12/25/20   Cyndi Bender, NP  predniSONE (DELTASONE) 50 MG tablet Take 50 mg by mouth daily. 12/15/20   [provider]  sucralfate (CARAFATE) 1 g tablet Take 1 g by mouth 4 (four) times daily. 11/23/20   [provider]    Family History History reviewed. No pertinent family history.  Social History Social History   Tobacco Use   Smoking status: Never    Passive exposure: Never   Smokeless tobacco: Never  Substance Use Topics   Alcohol use: Not Currently   Drug use: Never     Allergies   Phenergan [promethazine]   Review of Systems Review of Systems  Constitutional:  Negative for fever.  HENT:  Positive for ear pain. Negative for rhinorrhea.   All  other systems reviewed and are negative.   Physical Exam Triage Vital Signs ED Triage Vitals  Enc Vitals Group     BP 04/15/21 1516 (!) 135/93     Pulse Rate 04/15/21 1516 97     Resp 04/15/21 1516 15     Temp 04/15/21 1516 98.3 F (36.8 C)     Temp Source 04/15/21 1516 Oral     SpO2 04/15/21 1516 98 %     Weight --      Height --      Head Circumference --      Peak Flow --      Pain Score 04/15/21 1515 3     Pain Loc --      Pain Edu? --      Excl. in GC? --    No data found.  Updated Vital Signs BP (!) 135/93 (BP Location: Right Arm)   Pulse 97   Temp 98.3 F (36.8 C) (Oral)   Resp 15   SpO2 98%   Visual Acuity Right Eye  Distance:   Left Eye Distance:   Bilateral Distance:    Right Eye Near:   Left Eye Near:    Bilateral Near:     Physical Exam Vitals reviewed.  HENT:     Ears:     Comments: Right tm erythematous, ear canal swollen  Cardiovascular:     Rate and Rhythm: Normal rate.  Pulmonary:     Effort: Pulmonary effort is normal.  Musculoskeletal:        General: Normal range of motion.  Skin:    General: Skin is warm.  Neurological:     General: No focal deficit present.     Mental Status: He is alert.  Psychiatric:        Mood and Affect: Mood normal.     UC Treatments / Results  Labs (all labs ordered are listed, but only abnormal results are displayed) Labs Reviewed - No data to display  EKG   Radiology No results found.  Procedures Procedures (including critical care time)  Medications Ordered in UC Medications - No data to display  Initial Impression / Assessment and Plan / UC Course  I have reviewed the triage vital signs and the nursing notes.  Pertinent labs & imaging results that were available during my care of the patient were reviewed by me and considered in my medical decision making (see chart for details).      Final Clinical Impressions(s) / UC Diagnoses   Final diagnoses:  Right otitis media, unspecified otitis media type   Discharge Instructions   None    ED Prescriptions     Medication Sig Dispense Auth. Provider   amoxicillin (AMOXIL) 500 MG capsule Take 1 capsule (500 mg total) by mouth 3 (three) times daily. 30 capsule Elson Areas, [place]      PDMP not reviewed this encounter. An After Visit Summary was printed and given to the patient.    Elson Areas, [place] 04/15/21 1714

## 2021-10-25 ENCOUNTER — Emergency Department (HOSPITAL_COMMUNITY)
Admission: EM | Admit: 2021-10-25 | Discharge: 2021-10-25 | Disposition: A | Discharge status: Home / Self Care | Payer: BC Managed Care – PPO | Attending: Emergency Medicine | Admitting: Emergency Medicine

## 2021-10-25 ENCOUNTER — Emergency Department (HOSPITAL_COMMUNITY): Payer: BC Managed Care – PPO

## 2021-10-25 ENCOUNTER — Other Ambulatory Visit: Payer: Self-pay

## 2021-10-25 DIAGNOSIS — Z7982 Long term (current) use of aspirin: Secondary | ICD-10-CM | POA: Insufficient documentation

## 2021-10-25 DIAGNOSIS — R61 Generalized hyperhidrosis: Secondary | ICD-10-CM | POA: Insufficient documentation

## 2021-10-25 DIAGNOSIS — Z951 Presence of aortocoronary bypass graft: Secondary | ICD-10-CM | POA: Insufficient documentation

## 2021-10-25 DIAGNOSIS — R42 Dizziness and giddiness: Secondary | ICD-10-CM | POA: Diagnosis not present

## 2021-10-25 DIAGNOSIS — I2510 Atherosclerotic heart disease of native coronary artery without angina pectoris: Secondary | ICD-10-CM | POA: Diagnosis not present

## 2021-10-25 DIAGNOSIS — R519 Headache, unspecified: Secondary | ICD-10-CM | POA: Insufficient documentation

## 2021-10-25 DIAGNOSIS — J45909 Unspecified asthma, uncomplicated: Secondary | ICD-10-CM | POA: Diagnosis not present

## 2021-10-25 DIAGNOSIS — I10 Essential (primary) hypertension: Secondary | ICD-10-CM | POA: Insufficient documentation

## 2021-10-25 DIAGNOSIS — R079 Chest pain, unspecified: Secondary | ICD-10-CM

## 2021-10-25 DIAGNOSIS — R110 Nausea: Secondary | ICD-10-CM | POA: Diagnosis not present

## 2021-10-25 DIAGNOSIS — R072 Precordial pain: Secondary | ICD-10-CM | POA: Insufficient documentation

## 2021-10-25 DIAGNOSIS — Z79899 Other long term (current) drug therapy: Secondary | ICD-10-CM | POA: Insufficient documentation

## 2021-10-25 LAB — BASIC METABOLIC PANEL
Anion gap: 7 (ref 5–15)
BUN: 13 mg/dL (ref 6–20)
CO2: 22 mmol/L (ref 22–32)
Calcium: 8.7 mg/dL — ABNORMAL LOW (ref 8.9–10.3)
Chloride: 109 mmol/L (ref 98–111)
Creatinine, Ser: 1.11 mg/dL (ref 0.61–1.24)
GFR, Estimated: >60 mL/min (ref >60)
Glucose, Bld: 98 mg/dL (ref 70–99)
Potassium: 4 mmol/L (ref 3.5–5.1)
Sodium: 138 mmol/L (ref 135–145)

## 2021-10-25 LAB — CBC
HCT: 48.5 % (ref 39.0–52.0)
Hemoglobin: 16.9 g/dL (ref 13.0–17.0)
MCH: 30.1 pg (ref 26.0–34.0)
MCHC: 34.8 g/dL (ref 30.0–36.0)
MCV: 86.3 fL (ref 80.0–100.0)
Platelets: 269 10*3/uL (ref 150–400)
RBC: 5.62 MIL/uL (ref 4.22–5.81)
RDW: 11.8 % (ref 11.5–15.5)
WBC: 10 10*3/uL (ref 4.0–10.5)
nRBC: 0 % (ref 0.0–0.2)

## 2021-10-25 LAB — TROPONIN I (HIGH SENSITIVITY)
Troponin I (High Sensitivity): 4 ng/L (ref <18)
Troponin I (High Sensitivity): 4 ng/L (ref <18)

## 2021-10-25 LAB — MAGNESIUM: Magnesium: 2.1 mg/dL (ref 1.7–2.4)

## 2021-10-25 LAB — TSH: TSH: 1.72 u[IU]/mL (ref 0.350–4.500)

## 2021-10-25 MED ORDER — ASPIRIN 81 MG PO CHEW
162.0000 mg | CHEWABLE_TABLET | Freq: Once | ORAL | Status: AC
  Administered 2021-10-25: 162 mg via ORAL
  Filled 2021-10-25: qty 2

## 2021-10-25 NOTE — ED Provider Notes (Signed)
[facility] EMERGENCY DEPARTMENT Provider Note   CSN: [id number] Arrival date & time: 10/25/21  1132     History  Chief Complaint  Patient presents with   Chest Pain    Chase Edwards is a 56 y.o. male.   Patient as above with significant medical history as below, including CAD, CABG, hypertension, asthma, GERD who presents to the ED with complaint of chest pain.  He reports his blood pressure has been elevated over the weekend.  140s - 150s systolic.  Seen at OSH and was given beta-blocker which did improve his blood pressure.  He has been having intermittent nausea, lightheadedness.  Mildly worsened today.  He went to clinic at work to get checked, blood pressure was 140s again.  On the way back from the clinic while riding in a golf cart he began to have some chest tightness, pressure, heaviness midsternal, to his left arm.  Diaphoresis, mild nausea.  Mild headache.  Resolved after nitroglycerin, duration 10 to 15 minutes.  Currently feels tired.  Otherwise no chest pain.  No dyspnea.  No palpitations.  No syncope or sensation of near syncope.  No recent medication or diet changes.  Very similar presentation recently in July prior to [facility]  Dr Sharrell Ku [facility] primary cardiologist.   Had LHC by Dr End 7/22 with single vessel CAD with chronic occlusion of mid LAD stent.     Past Medical History: No date: Asthma No date: Coronary artery disease No date: GERD (gastroesophageal reflux disease) No date: Hypertension  Past Surgical History: No date: APPENDECTOMY No date: CORONARY ARTERY BYPASS GRAFT No date: CORONARY STENT INTERVENTION 12/24/2020: LEFT HEART CATH AND CORS/GRAFTS ANGIOGRAPHY; N/A     Comment:  Procedure: LEFT HEART CATH AND CORS/GRAFTS ANGIOGRAPHY;               Surgeon: Yvonne Kendall, MD;  Location: [facility] INVASIVE CV               LAB;  Service: Cardiovascular;  Laterality: N/A;    The history is provided by the patient. No language interpreter was used.   Chest Pain Associated symptoms: diaphoresis, headache and nausea   Associated symptoms: no abdominal pain, no cough, no dysphagia, no fever, no palpitations, no shortness of breath and no vomiting    HPI: A 56 year old patient with a history of hypertension and obesity presents for evaluation of chest pain. Initial onset of pain was approximately 1-3 hours ago. The patient's chest pain is described as heaviness/pressure/tightness, is not worse with exertion and is relieved by nitroglycerin. The patient reports some diaphoresis. The patient's chest pain is not middle- or left-sided, is not well-localized, is not sharp and does radiate to the arms/jaw/neck. The patient does not complain of nausea. The patient has no history of stroke, has no history of peripheral artery disease, has not smoked in the past 90 days, denies any history of treated diabetes, has no relevant family history of coronary artery disease (first degree relative at less than age 53) and has no history of hypercholesterolemia.   Home Medications Prior to Admission medications   Medication Sig Start Date End Date Taking? Authorizing Provider  albuterol (VENTOLIN HFA) 108 (90 Base) MCG/ACT inhaler Inhale 1 puff into the lungs every 6 (six) hours as needed for shortness of breath. 07/21/20   [provider]  amoxicillin (AMOXIL) 500 MG capsule Take 1 capsule (500 mg total) by mouth 3 (three) times daily. 04/15/21   Elson Areas, PA-C  aspirin 81 MG chewable tablet Chew 1 tablet (81 mg total) by mouth daily. 12/25/20   Cyndi Bender, NP  atorvastatin (LIPITOR) 80 MG tablet Take 1 tablet (80 mg total) by mouth daily. 12/25/20   Cyndi Bender, NP  isosorbide mononitrate (IMDUR) 30 MG 24 hr tablet Take 0.5 tablets (15 mg total) by mouth daily. 12/25/20   Cyndi Bender, NP  metoprolol succinate (TOPROL-XL) 25 MG 24 hr tablet Take 12.5 mg by mouth daily. 12/21/20   [provider]  nitroGLYCERIN (NITROSTAT) 0.4 MG SL tablet Place 0.4 mg  under the tongue every 5 (five) minutes as needed for chest pain. 12/22/20   [provider]  pantoprazole (PROTONIX) 40 MG tablet Take 1 tablet (40 mg total) by mouth daily. 12/25/20   Cyndi Bender, NP  pravastatin (PRAVACHOL) 80 MG tablet Take 80 mg by mouth daily as needed. 03/09/21   [provider]  predniSONE (DELTASONE) 50 MG tablet Take 50 mg by mouth daily. 12/15/20   [provider]  sucralfate (CARAFATE) 1 g tablet Take 1 g by mouth 4 (four) times daily. 11/23/20   [provider]      Allergies    Phenergan [promethazine]    Review of Systems   Review of Systems  Constitutional:  Positive for diaphoresis. Negative for chills and fever.  HENT:  Negative for facial swelling and trouble swallowing.   Eyes:  Negative for photophobia and visual disturbance.  Respiratory:  Negative for cough and shortness of breath.   Cardiovascular:  Positive for chest pain. Negative for palpitations.  Gastrointestinal:  Positive for nausea. Negative for abdominal pain and vomiting.  Endocrine: Negative for polydipsia and polyuria.  Genitourinary:  Negative for difficulty urinating and hematuria.  Musculoskeletal:  Negative for gait problem and joint swelling.  Skin:  Negative for pallor and rash.  Neurological:  Positive for light-headedness and headaches. Negative for syncope.  Psychiatric/Behavioral:  Negative for agitation and confusion.    Physical Exam Updated Vital Signs BP 119/75 (BP Location: Right Arm)   Pulse 70   Temp 98.5 F (36.9 C) (Oral)   Resp 17   Ht 6\' 2"  (1.88 m)   Wt 121.6 kg   SpO2 96%   BMI 34.41 kg/m  Physical Exam Vitals and nursing note reviewed.  Constitutional:      General: He is not in acute distress.    Appearance: He is well-developed. He is obese. He is not ill-appearing.  HENT:     Head: Normocephalic and atraumatic.     Right Ear: External ear normal.     Left Ear: External ear normal.     Mouth/Throat:     Mouth:  Mucous membranes are moist.  Eyes:     General: No scleral icterus. Cardiovascular:     Rate and Rhythm: Normal rate and regular rhythm.     Pulses: Normal pulses.     Heart sounds: Normal heart sounds.    No S3 or S4 sounds.  Pulmonary:     Effort: Pulmonary effort is normal. No tachypnea, accessory muscle usage or respiratory distress.     Breath sounds: Normal breath sounds. No decreased breath sounds.  Chest:    Abdominal:     General: Abdomen is flat.     Palpations: Abdomen is soft.     Tenderness: There is no abdominal tenderness.  Musculoskeletal:        General: Normal range of motion.     Cervical back: Normal range of motion.  Right lower leg: No edema.     Left lower leg: No edema.  Skin:    General: Skin is warm and dry.     Capillary Refill: Capillary refill takes less than 2 seconds.  Neurological:     Mental Status: He is alert and oriented to person, place, and time.     GCS: GCS eye subscore is 4. GCS verbal subscore is 5. GCS motor subscore is 6.  Psychiatric:        Mood and Affect: Mood normal.        Behavior: Behavior normal.    ED Results / Procedures / Treatments   Labs (all labs ordered are listed, but only abnormal results are displayed) Labs Reviewed  BASIC METABOLIC PANEL - Abnormal; Notable for the following components:      Result Value   Calcium 8.7 (*)    All other components within normal limits  CBC  TSH  MAGNESIUM  TROPONIN I (HIGH SENSITIVITY)  TROPONIN I (HIGH SENSITIVITY)    EKG EKG Interpretation  Date/Time:  Monday Oct 25 2021 11:52:08 EDT Ventricular Rate:  105 PR Interval:  165 QRS Duration: 100 QT Interval:  339 QTC Calculation: 448 R Axis:   156 Text Interpretation: Sinus tachycardia Anterior infarct, old similar to prior tracing Confirmed by Tanda Rockers ([id number]) on 10/25/2021 11:55:37 AM  Radiology DG Chest Port 1 View  Result Date: 10/25/2021 CLINICAL DATA:  Provided history: Chest pain. EXAM: PORTABLE CHEST  1 VIEW COMPARISON:  Prior chest radiographs 12/28/2020 and earlier. FINDINGS: Prior median sternotomy. Heart size at the upper limits of normal, unchanged. Aortic atherosclerosis. Prior coronary artery stenting. Small left pleural effusion. No appreciable airspace consolidation or pulmonary edema. No evidence of pneumothorax. No acute bony abnormality identified. IMPRESSION: Small left pleural effusion. No appreciable airspace consolidation or pulmonary edema. Aortic Atherosclerosis (ICD10-I70.0). Electronically Signed   By: Jackey Loge D.O.   On: 10/25/2021 12:24    Procedures Procedures    Medications Ordered in ED Medications  aspirin chewable tablet 162 mg (162 mg Oral Given 10/25/21 1307)    ED Course/ Medical Decision Making/ A&P    HEAR Score: 5       HEART Score: 5                Medical Decision Making Amount and/or Complexity of Data Reviewed Labs: ordered. Radiology: ordered.  Risk OTC drugs.    CC: Chest pain, elevated blood pressure  This patient presents to the Emergency Department for the above complaint. This involves an extensive number of treatment options and is a complaint that carries with it a high risk of complications and morbidity. Vital signs were reviewed. Serious etiologies considered.  DDx includes but not limited to, ACS, pneumonia, pneumothorax, CHF, pericardial effusion, MSK, atypical chest pain, electrolyte derangement, metabolic, infectious  Low risk Wells score.  Record review:  Previous records obtained and reviewed  Prior ED visits, prior labs and imaging.  Home medications  Additional history obtained from N/A  Medical and surgical history as noted above.   Work up as above, notable for:  Labs & imaging results that were available during my care of the patient were visualized by me and considered in my medical decision making.   I ordered imaging studies which included chest x-ray and I visualized the imaging and I agree with  radiologist interpretation.  No acute process  Cardiac monitoring reviewed and interpreted personally which shows NSR  Patient took 181 mg aspirin prior to  arrival.  We will give the other 181 mg aspirin.  EKG reviewed, does appear similar to prior, there is subtle ST elevation in inferior leads which is seen on prior tracing.  No reciprocal depression.  He has no chest pain.  Troponins are flat.  4, 4.  Heart score is elevated.  History of recent CABG, LHC 6 months ago.  Will discuss with cardiology.  Management: Asa 181 mg  Reassessment:  Pt remains asymptomatic. He was speaking to his cardiologist while in the treatment room. Will have him follow up in the next few days. Observation offered however pt prefers to follow up as outpatient.   The patient's chest pain is not suggestive of pulmonary embolus, cardiac ischemia, aortic dissection, pericarditis, myocarditis, pulmonary embolism, pneumothorax, pneumonia, Zoster, or esophageal perforation, or other serious etiology.  Historically not abrupt in onset, tearing or ripping, pulses symmetric. EKG nonspecific for ischemia/infarction. No dysrhythmias, brugada, WPW, prolonged QT noted.   Given the extremely low risk of these diagnoses further testing and evaluation for these possibilities does not appear to be indicated at this time. Patient in no distress and overall condition improved here in the ED. Detailed discussions were had with the patient regarding current findings, and need for close f/u with PCP or on call doctor. The patient has been instructed to return immediately if the symptoms worsen in any way for re-evaluation. Patient verbalized understanding and is in agreement with current care plan. All questions answered prior to discharge.                  Social determinants of health include -  Social History   Socioeconomic History   Marital status: Single    Spouse name: Not on file   Number of children: Not on  file   Years of education: Not on file   Highest education level: Not on file  Occupational History   Not on file  Tobacco Use   Smoking status: Never    Passive exposure: Never   Smokeless tobacco: Never  Substance and Sexual Activity   Alcohol use: Not Currently   Drug use: Never   Sexual activity: Not on file  Other Topics Concern   Not on file  Social History Narrative   Not on file   Social Determinants of Health   Financial Resource Strain: Not on file  Food Insecurity: Not on file  Transportation Needs: Not on file  Physical Activity: Not on file  Stress: Not on file  Social Connections: Not on file  Intimate Partner Violence: Not on file      This chart was dictated using voice recognition software.  Despite best efforts to proofread,  errors can occur which can change the documentation meaning.          Final Clinical Impression(s) / ED Diagnoses Final diagnoses:  Chest pain, unspecified type    Rx / DC Orders ED Discharge Orders          Ordered    Ambulatory referral to Cardiology       Comments: If you have not heard from the Cardiology office within the next 72 hours please call [phone].   10/25/21 1519              Sloan Leiter, DO 10/25/21 1845

## 2021-10-25 NOTE — Discharge Instructions (Signed)
It was a pleasure caring for you today in the emergency department. ° °Please return to the emergency department for any worsening or worrisome symptoms. ° ° °

## 2021-10-25 NOTE — ED Triage Notes (Signed)
Pt BIB EMS due to chest pain that radiated in right arm.  Pt states he was having an issue with BP over the weekend. EMS states pt was having n/v and diaphoresis. Pt took aspirin. EMS gave nitro and zofran. VSS. Axox4.  ?

## 2021-12-06 DIAGNOSIS — F419 Anxiety disorder, unspecified: Secondary | ICD-10-CM | POA: Diagnosis present

## 2021-12-06 DIAGNOSIS — Z951 Presence of aortocoronary bypass graft: Secondary | ICD-10-CM

## 2022-07-01 ENCOUNTER — Encounter: Payer: Self-pay | Admitting: Internal Medicine

## 2022-07-02 IMAGING — DX DG CHEST 1V PORT
1 series · 2 of 2 positions shown · non-contrast
Comparison: 08/19/2020

CLINICAL DATA: Chest pain

EXAM:
PORTABLE CHEST 1 VIEW

[Series 1: chest · 0.14mm/px · 2 of 2 slices shown]
[im 1/2]
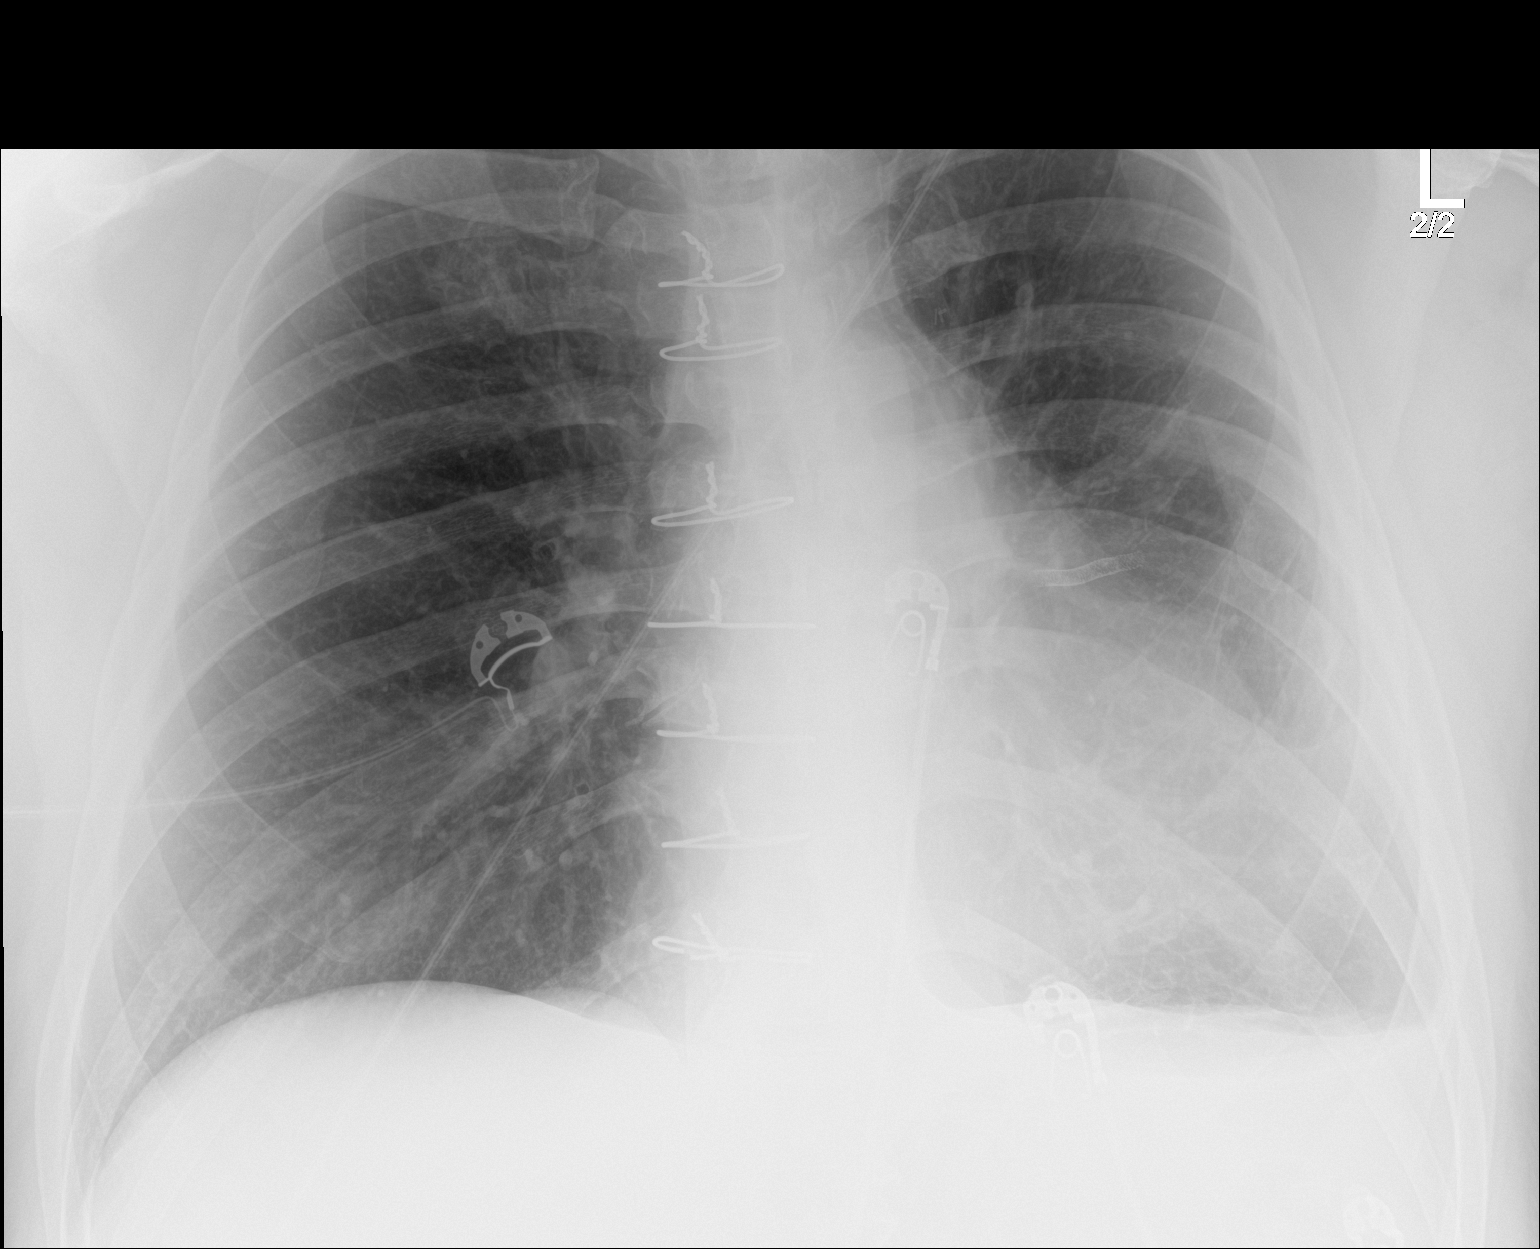
[im 2/2]
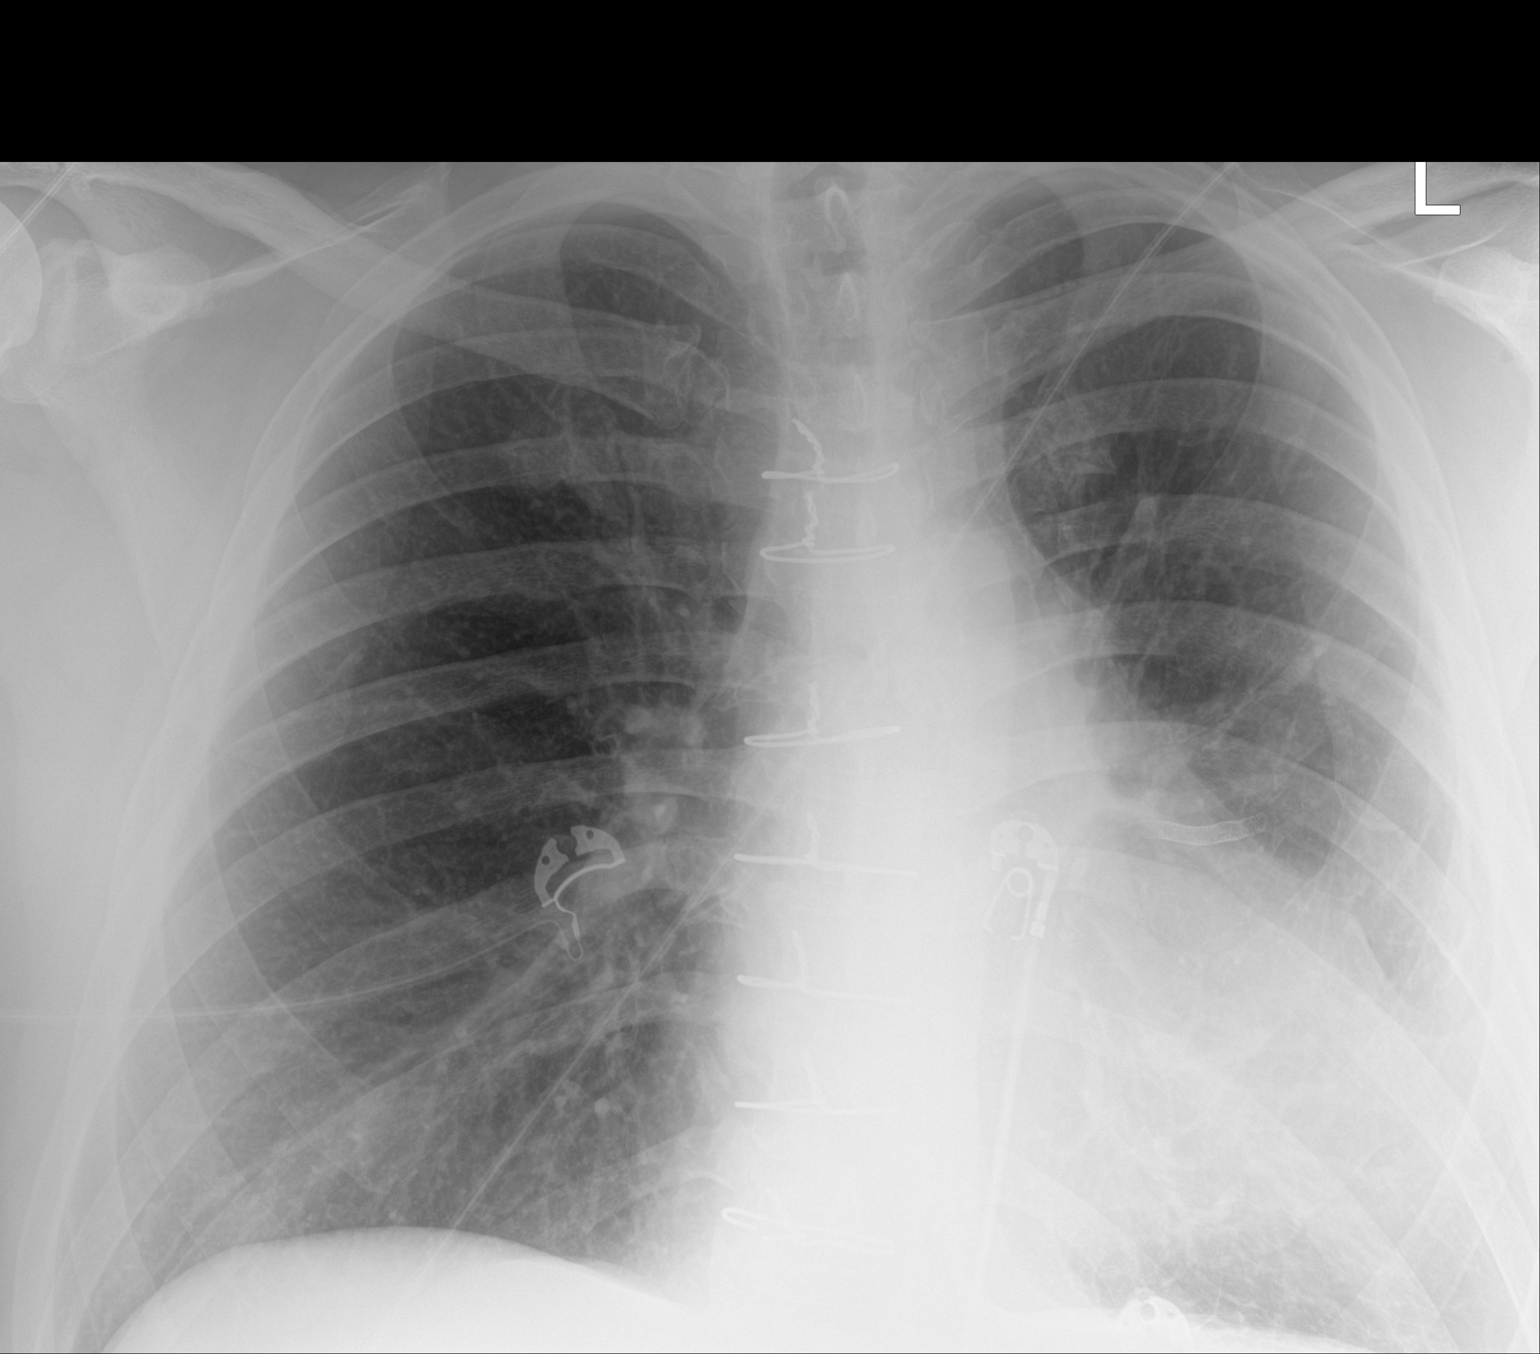

[2 of 2 positions shown; findings below may reference images not displayed]

FINDINGS: Status post median sternotomy and CABG procedure. Coronary artery
stent noted along the left heart border. Chronic pleural and
parenchymal scarring with blunting of the left costophrenic angle.
No airspace opacification identified. No pleural effusion or edema.
IMPRESSION: No acute cardiopulmonary abnormalities.

## 2022-07-31 NOTE — Progress Notes (Signed)
H&P  Chief Complaint: ED  History of Present Illness: 57 year old male [facility] and management of erectile dysfunction.  For a year or 2 he has been having more difficult time obtaining and maintaining erections.  No prior injuries during intercourse.  The patient is fairly newlywed and his wife desires to be active.  He tried Cialis a few years ago that seemed to work okay.  He does have coronary artery disease.  He is status post CABG in 2022.  He was on isosorbide for short time which he did not tolerate well because of headaches.  He is not ever taken sublingual nitroglycerin. Past Medical History:  Diagnosis Date   Asthma    Coronary artery disease    GERD (gastroesophageal reflux disease)    Hypertension     Past Surgical History:  Procedure Laterality Date   APPENDECTOMY     CORONARY ARTERY BYPASS GRAFT     CORONARY STENT INTERVENTION     LEFT HEART CATH AND CORS/GRAFTS ANGIOGRAPHY N/A 12/24/2020   Procedure: LEFT HEART CATH AND CORS/GRAFTS ANGIOGRAPHY;  Surgeon: Nelva Bush, MD;  Location: [facility];  Service: Cardiovascular;  Laterality: N/A;    Home Medications:  Allergies as of 08/02/2022       Reactions   Phenergan [promethazine]    Seizures and vomiting        Medication List        Accurate as of July 31, 2022  9:10 PM. If you have any questions, ask your nurse or doctor.          albuterol 108 (90 Base) MCG/ACT inhaler Commonly known as: VENTOLIN HFA Inhale 1 puff into the lungs every 6 (six) hours as needed for shortness of breath.   amoxicillin 500 MG capsule Commonly known as: AMOXIL Take 1 capsule (500 mg total) by mouth 3 (three) times daily.   aspirin 81 MG chewable tablet Chew 1 tablet (81 mg total) by mouth daily.   atorvastatin 80 MG tablet Commonly known as: LIPITOR Take 1 tablet (80 mg total) by mouth daily.   isosorbide mononitrate 30 MG 24 hr tablet Commonly known as:  IMDUR Take 0.5 tablets (15 mg total) by mouth daily.   metoprolol succinate 25 MG 24 hr tablet Commonly known as: TOPROL-XL Take 12.5 mg by mouth daily.   nitroGLYCERIN 0.4 MG SL tablet Commonly known as: NITROSTAT Place 0.4 mg under the tongue every 5 (five) minutes as needed for chest pain.   pantoprazole 40 MG tablet Commonly known as: PROTONIX Take 1 tablet (40 mg total) by mouth daily.   pravastatin 80 MG tablet Commonly known as: PRAVACHOL Take 80 mg by mouth daily as needed.   predniSONE 50 MG tablet Commonly known as: DELTASONE Take 50 mg by mouth daily.   sucralfate 1 g tablet Commonly known as: CARAFATE Take 1 g by mouth 4 (four) times daily.        Allergies:  Allergies  Allergen Reactions   Phenergan [Promethazine]     Seizures and vomiting    No family history on file.  Social History:  reports that he has never smoked. He has never been exposed to tobacco smoke. He has never used smokeless tobacco. He reports that he does not currently use alcohol. He reports that he does not use drugs.  ROS: A complete review of systems was performed.  All systems are negative except for pertinent findings as noted.  Physical Exam:  Vital signs  in last 24 hours: There were no vitals taken for this visit. 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  No use of nitrates currently.  Plan:  I reassured him that he is fine to take sildenafil or tadalafil  He was given a prescription for sildenafil 100 mg, 1/2-1 p.o. as needed  He will return as needed

## 2022-08-02 ENCOUNTER — Ambulatory Visit: Payer: BC Managed Care – PPO | Admitting: Urology

## 2022-08-02 ENCOUNTER — Ambulatory Visit: Payer: BC Managed Care – PPO | Admitting: Gastroenterology

## 2022-08-02 ENCOUNTER — Encounter: Payer: Self-pay | Admitting: Urology

## 2022-08-02 VITALS — BP 127/74 | HR 88

## 2022-08-02 DIAGNOSIS — N5201 Erectile dysfunction due to arterial insufficiency: Secondary | ICD-10-CM | POA: Diagnosis not present

## 2022-08-02 MED ORDER — SILDENAFIL CITRATE 100 MG PO TABS: ORAL_TABLET | ORAL | 99 refills | Status: DC

## 2022-08-23 ENCOUNTER — Ambulatory Visit: Payer: BC Managed Care – PPO | Admitting: Gastroenterology

## 2022-09-05 NOTE — Progress Notes (Unsigned)
GI Office Note    Referring Provider: Janine Limbo, PA-C with Dr. Pleas Koch Primary Care Physician:  Curlene Labrum, MD  Primary Gastroenterologist: Dr. Jenetta Downer  Chief Complaint   Chief Complaint  Patient presents with   Gastroesophageal Reflux    Acid reflux for about 4 or 5 years. No medication is working.      History of Present Illness   Chase Edwards is a 57 y.o. male presenting today at the request of Dr. Pleas Koch for further evaluation of GERD.    Patient reports history of asthma, obstructive sleep apnea, CAD status post CABG, chronic GERD, hypertension.  History of coronary artery stent(s) with subsequent CABG.  Currently followed by cardiology in the evening, Dr. Candis Musa.  History of obesity, used to weigh 310 pounds, actively making changes to lose weight.  Down to 266 pounds with goal of reaching 220 pounds.   Patient has a longstanding history of GERD dating back at least 10 years.  He has been on multiple PPIs in the past without significant improvement of his symptoms. Seen by GI (Dr. Margaretmary Bayley) [facility] 03/2021.  Tried and failed omeprazole 40mg  BID, pantoprazole 40mg  BID, Nexium 40mg  BID, prevacid 30mg  BID.  Dexilant denied by insurance, never able to try it.  Completed EGD and colonoscopy as outlined below.  Patient notes after his EGD with esophageal dilation, his reflux symptoms were better for about a month.  At that time he was on pantoprazole once daily, sulcal fate 4 times daily.  Patient reports his predominant reflux symptoms are regurgitation and cough.  With dietary modifications, avoidance of trigger foods, allowing enough time between eating and laying down, avoiding overeating, typical heartburn is manageable.  He has to avoid all spicy foods, acidic foods, unable to eat out because of his symptoms.  Currently taking pantoprazole 40 mg twice daily and famotidine 40 mg twice daily.  He takes medication at the same time.  During the daytime, he  does have postprandial coughing.  He notes that he constantly swallows, this seems to sooth the urge to cough.  Describes having a tickle in the back of his throat that prompts coughing.  Initially he thought it might be medication related but cardiology does not feel like any of his medication should trigger cough.  He is not on any ACE inhibitors however losartan can be associated with cough.  He also considered his asthma as a contributing factor but states that at this time his asthma is the best its ever been controlled.  He has been seen by ENT in [place] (Dr. Pilar Plate?),  Pulmonary at [facility] 2022.  Patient notes nighttime cough and regurgitation occurs when he lays down or even reclines especially if he has consumed any trigger foods or eating within 2 hours.  Consumes peppermint candy all throughout the day to initiate swallowing to control his cough.  About 2 months ago his cough was bad, he went through 7 pound bag of candy in 2 weeks.  Currently things are somewhat improved on current regimen and with strict antireflux measures.  Consume 7 pound bag of candy in 2 months.  Really no significant heartburn type symptoms unless he is having a bad time with the coughing/regurgitation.  Notably patient has tried his asthma medications when he has had coughing but no improvement.  Denies any dysphagia.  Labs December 2023: BUN 21, creatinine 1.22, total bilirubin 0.5, alkaline phosphatase 86, AST 19, ALT 27, sodium 141, potassium  3.9, white blood cell count 8000, hemoglobin 16.7, platelets 216,000.  CTA chest with contrast December 2023: No evidence of pulmonary embolus to the proximal segmental pulmonary artery level.  Hypoventilatory change in the dependent lungs with linear band of atelectasis/scarring in the left lower lobe and lingula, small hiatal hernia.  Right upper quadrant ultrasound July 2022: Normal gallbladder.  EGD 05/2021: esophagus normal. Empiric dilaiton with  Savary dilator (53mm). Esophageal biopsies taken from distal and proximal esophagus. Esophageal biopsies unremarkable.   Colonoscopy 05/2021: 52mm semi-pedunculated polyp removed from ascending colon (tubular adenoma), medium internal hemorrhoids.   Medications   Current Outpatient Medications  Medication Sig Dispense Refill   albuterol (VENTOLIN HFA) 108 (90 Base) MCG/ACT inhaler Inhale 1 puff into the lungs every 6 (six) hours as needed for shortness of breath.     aspirin 81 MG chewable tablet Chew 1 tablet (81 mg total) by mouth daily. 90 tablet 1   famotidine (PEPCID) 20 MG tablet Take 40 mg by mouth 2 (two) times daily.     loratadine (CLARITIN) 10 MG tablet Take 1 tablet by mouth daily.     losartan (COZAAR) 50 MG tablet Take by mouth.     metoprolol succinate (TOPROL-XL) 25 MG 24 hr tablet Take 12.5 mg by mouth daily.     nitroGLYCERIN (NITROSTAT) 0.4 MG SL tablet Place 0.4 mg under the tongue every 5 (five) minutes as needed for chest pain.     pravastatin (PRAVACHOL) 80 MG tablet Take 80 mg by mouth daily as needed.     sildenafil (VIAGRA) 100 MG tablet 1/2-1 tab po prn 30 tablet prn   pantoprazole (PROTONIX) 40 MG tablet Take 1 tablet (40 mg total) by mouth 2 (two) times daily before a meal. 60 tablet 5   No current facility-administered medications for this visit.    Allergies   Allergies as of 09/06/2022 - Review Complete 09/06/2022  Allergen Reaction Noted   Phenergan [promethazine]  12/24/2020    Past Medical History   Past Medical History:  Diagnosis Date   Asthma    Coronary artery disease    GERD (gastroesophageal reflux disease)    Hypertension    Sleep apnea     Past Surgical History   Past Surgical History:  Procedure Laterality Date   APPENDECTOMY     CORONARY ARTERY BYPASS GRAFT     CORONARY STENT INTERVENTION     LEFT HEART CATH AND CORS/GRAFTS ANGIOGRAPHY N/A 12/24/2020   Procedure: LEFT HEART CATH AND CORS/GRAFTS ANGIOGRAPHY;  Surgeon: Nelva Bush, MD;  Location: [facility];  Service: Cardiovascular;  Laterality: N/A;    Past Family History   Family History  Problem Relation Age of Onset   Colon cancer Neg Hx     Past Social History   Social History   Socioeconomic History   Marital status: Single    Spouse name: Not on file   Number of children: Not on file   Years of education: Not on file   Highest education level: Not on file  Occupational History   Not on file  Tobacco Use   Smoking status: Never    Passive exposure: Never   Smokeless tobacco: Never  Substance and Sexual Activity   Alcohol use: Not Currently   Drug use: Never   Sexual activity: Not on file  Other Topics Concern   Not on file  Social History Narrative   Not on file   Social Determinants of Health   Financial Resource  Strain: Not on file  Food Insecurity: Not on file  Transportation Needs: Not on file  Physical Activity: Not on file  Stress: Not on file  Social Connections: Not on file  Intimate Partner Violence: Not on file    Review of Systems   General: Negative for anorexia, unintentional weight loss, fever, chills, fatigue, weakness. Eyes: Negative for vision changes.  ENT: Negative for hoarseness, difficulty swallowing , nasal congestion. CV: Negative for chest pain, angina, palpitations, dyspnea on exertion, peripheral edema.  Respiratory: Negative for dyspnea at rest, dyspnea on exertion,++ cough, sputum, wheezing.  GI: See history of present illness. GU:  Negative for dysuria, hematuria, urinary incontinence, urinary frequency, nocturnal urination.  MS: Negative for joint pain, low back pain.  Derm: Negative for rash or itching.  Neuro: Negative for weakness, abnormal sensation, seizure, frequent headaches, memory loss,  confusion.  Psych: Negative for anxiety, depression, suicidal ideation, hallucinations.  Endo: Negative for unusual weight change.  Heme: Negative for bruising or bleeding. Allergy:  Negative for rash or hives.  Physical Exam   BP 136/74 (BP Location: Right Arm, Patient Position: Sitting, Cuff Size: Large)   Pulse 75   Temp 97.6 F (36.4 C) (Temporal)   Ht 6\' 2"  (1.88 m)   Wt 266 lb (120.7 kg)   SpO2 96%   BMI 34.15 kg/m    General: Well-nourished, well-developed in no acute distress.  Head: Normocephalic, atraumatic.   Eyes: Conjunctiva pink, no icterus. Mouth: Oropharyngeal mucosa moist and pink , no lesions erythema or exudate. Poor dentition.  Neck: Supple without thyromegaly, masses, or lymphadenopathy.  Lungs: Clear to auscultation bilaterally.  Heart: Regular rate and rhythm, no murmurs rubs or gallops.  Abdomen: Bowel sounds are normal, nontender, nondistended, no hepatosplenomegaly or masses,  no abdominal bruits or hernia, no rebound or guarding.   Rectal: not performed Extremities: No lower extremity edema. No clubbing or deformities.  Neuro: Alert and oriented x 4 , grossly normal neurologically.  Skin: Warm and dry, no rash or jaundice.   Psych: Alert and cooperative, normal mood and affect.  Labs   See hpi  Imaging Studies   No results found.  Assessment   GERD/cough: Typical heartburn type symptoms more easily manageable with medication and lifestyle changes although he does have to be quite strict with his diet.  Predominant complaints of regurgitation and cough, especially postprandially and at nighttime although symptoms occur throughout the day as well.  He does not regurgitation triggers coughing. However, he also has cough not associated with regurgitation, described as a tickle in the back of the throat. Feels need to constantly swallow to control urge to cough and to prevent regurgitation.   At baseline he has a history of asthma, he reports it is well-controlled, last seen by pulmonology in 2022. Has pending referral to allergy/asthma clinic. [place] to [place].  Evaluated by GI at [facility] in 2022 with EGD and colonoscopy.  Patient states at the time he had to go to [facility] due to his insurance but now prefers to have things done locally with insurance change.  He is open to going back to GI at [facility] health if needed for any particular intervention not done here locally.  Plans at [facility] GI included 24-hour pH study if no improvement on reflux regimen at that time.   PLAN   Will adjust medications, have him take pantoprazole 40mg  before breakfast and  evening meal. Take famotidine 40mg  at lunch and bedtime.  Review ENT records when available. No significant pulmonary records available (seen for sleep apnea).  To discuss with Dr. Jenetta Downer. I would anticipate need for pH/impedence study. If symptoms proven to be due to GERD, may benefit from TIF (transoral incisional fundoplication). May also consider trial of Baclofen for reflux inhibitor properties.    Laureen Ochs. Bobby Rumpf, [place], PA-C [facility]  I have reviewed the note and agree with the APP's assessment as described in this progress note  Interesting case with symptoms that have been refractory to PPIs. Atypical reflux symptom, though. Best way to approach this is by doing a pH impedance testing on PPI, as he is having cough/regurgitation even while taking the medicine. If his symptoms do no correlate with significant acid exposure or regurgitation episodes, a GI source would be ruled out. If he does not have any dysphagia, then no need to do manometry.   His BMI is <35 and he should be an acceptable candidate for TIF. Prior to deciding how to approach his case, I will need to do an EGD - may want to get that set up now.  improvement  with baclofen for refractory GERD is controvertial, but it is an option. If he indeed has ongoing GERD, may need to escalate therapy -  Voquezna may be a way better option ( he should stop PPI and anti H2 if starting  this)   Maylon Peppers, MD Gastroenterology and [facility]

## 2022-09-06 ENCOUNTER — Encounter: Payer: Self-pay | Admitting: Gastroenterology

## 2022-09-06 ENCOUNTER — Ambulatory Visit: Payer: BC Managed Care – PPO | Admitting: Gastroenterology

## 2022-09-06 VITALS — BP 136/74 | HR 75 | Temp 97.6°F | Ht 74.0 in | Wt 266.0 lb

## 2022-09-06 DIAGNOSIS — K219 Gastro-esophageal reflux disease without esophagitis: Secondary | ICD-10-CM | POA: Insufficient documentation

## 2022-09-06 MED ORDER — PANTOPRAZOLE SODIUM 40 MG PO TBEC: 40.0000 mg | DELAYED_RELEASE_TABLET | Freq: Two times a day (BID) | ORAL | 5 refills | Status: DC

## 2022-09-06 NOTE — Patient Instructions (Signed)
Continue pantoprazole 40mg  twice daily before breakfast and evening meal.  Continue famotidine 40mg  at lunch and bedtime. I will review your records from ENT, pulmonology and discuss case with Dr. Jenetta Downer. We will be in touch with further recommendations. Follow antireflux measures, see handout.

## 2022-09-07 ENCOUNTER — Encounter: Payer: Self-pay | Admitting: Gastroenterology

## 2022-09-21 ENCOUNTER — Telehealth: Payer: Self-pay | Admitting: Gastroenterology

## 2022-09-21 NOTE — Telephone Encounter (Signed)
Please let patient know that Dr. Jenetta Downer has reviewed his case.   He is recommended EGD first. Based on findings he will likely recommend pH/impedence study on PPI BID.   I reviewed recrds from [facility]: Umbilical hernia and two epigastric hernias repaired in 04/2018 No records received from Dr. Pilar Plate (ENT)    If he is agreeable, please schedule for EGD with Dr. Jenetta Downer. ASA 3. Dx: GERD/LPR

## 2022-09-22 ENCOUNTER — Encounter: Payer: Self-pay | Admitting: *Deleted

## 2022-09-22 NOTE — Telephone Encounter (Signed)
Pt has been scheduled for 10/21/22. Instructions mailed.

## 2022-09-22 NOTE — Telephone Encounter (Signed)
Pt was made aware and verbalized understanding. Pt is ready to move forward with scheduling EGD as recommended.

## 2022-09-26 ENCOUNTER — Encounter: Payer: Self-pay | Admitting: *Deleted

## 2022-10-01 ENCOUNTER — Other Ambulatory Visit: Payer: Self-pay | Admitting: Gastroenterology

## 2022-10-19 ENCOUNTER — Encounter (HOSPITAL_COMMUNITY)
Admission: RE | Admit: 2022-10-19 | Discharge: 2022-10-19 | Disposition: A | Discharge status: Home / Self Care | Payer: BC Managed Care – PPO | Source: Ambulatory Visit | Attending: Gastroenterology | Admitting: Gastroenterology

## 2022-10-21 ENCOUNTER — Ambulatory Visit (HOSPITAL_COMMUNITY)
Admission: RE | Admit: 2022-10-21 | Discharge: 2022-10-21 | Disposition: A | Discharge status: Home / Self Care | Payer: BC Managed Care – PPO | Attending: Gastroenterology | Admitting: Gastroenterology

## 2022-10-21 ENCOUNTER — Encounter (HOSPITAL_COMMUNITY)
Admission: RE | Disposition: A | Discharge status: Home / Self Care | Payer: Self-pay | Source: Home / Self Care | Attending: Gastroenterology

## 2022-10-21 ENCOUNTER — Ambulatory Visit (HOSPITAL_COMMUNITY): Payer: BC Managed Care – PPO

## 2022-10-21 ENCOUNTER — Encounter (HOSPITAL_COMMUNITY): Payer: Self-pay | Admitting: Gastroenterology

## 2022-10-21 DIAGNOSIS — J45909 Unspecified asthma, uncomplicated: Secondary | ICD-10-CM | POA: Insufficient documentation

## 2022-10-21 DIAGNOSIS — K31A19 Gastric intestinal metaplasia without dysplasia, unspecified site: Secondary | ICD-10-CM | POA: Diagnosis not present

## 2022-10-21 DIAGNOSIS — K3189 Other diseases of stomach and duodenum: Secondary | ICD-10-CM | POA: Diagnosis not present

## 2022-10-21 DIAGNOSIS — K219 Gastro-esophageal reflux disease without esophagitis: Secondary | ICD-10-CM | POA: Diagnosis not present

## 2022-10-21 DIAGNOSIS — G4733 Obstructive sleep apnea (adult) (pediatric): Secondary | ICD-10-CM | POA: Insufficient documentation

## 2022-10-21 DIAGNOSIS — Z951 Presence of aortocoronary bypass graft: Secondary | ICD-10-CM | POA: Diagnosis not present

## 2022-10-21 DIAGNOSIS — I2510 Atherosclerotic heart disease of native coronary artery without angina pectoris: Secondary | ICD-10-CM | POA: Diagnosis not present

## 2022-10-21 DIAGNOSIS — K2289 Other specified disease of esophagus: Secondary | ICD-10-CM

## 2022-10-21 DIAGNOSIS — Z955 Presence of coronary angioplasty implant and graft: Secondary | ICD-10-CM | POA: Insufficient documentation

## 2022-10-21 DIAGNOSIS — I10 Essential (primary) hypertension: Secondary | ICD-10-CM | POA: Diagnosis not present

## 2022-10-21 HISTORY — PX: ESOPHAGOGASTRODUODENOSCOPY (EGD) WITH PROPOFOL: SHX5813

## 2022-10-21 HISTORY — PX: BIOPSY: SHX5522

## 2022-10-21 SURGERY — ESOPHAGOGASTRODUODENOSCOPY (EGD) WITH PROPOFOL
Anesthesia: General

## 2022-10-21 MED ORDER — LIDOCAINE HCL (CARDIAC) PF 100 MG/5ML IV SOSY
PREFILLED_SYRINGE | INTRAVENOUS | Status: DC | PRN
  Administered 2022-10-21: 50 mg via INTRAVENOUS

## 2022-10-21 MED ORDER — PROPOFOL 10 MG/ML IV BOLUS
INTRAVENOUS | Status: DC | PRN
  Administered 2022-10-21: 50 mg via INTRAVENOUS
  Administered 2022-10-21: 100 mg via INTRAVENOUS

## 2022-10-21 MED ORDER — PROPOFOL 500 MG/50ML IV EMUL
INTRAVENOUS | Status: DC | PRN
  Administered 2022-10-21: 150 ug/kg/min via INTRAVENOUS

## 2022-10-21 MED ORDER — LACTATED RINGERS IV SOLN: INTRAVENOUS | Status: DC | PRN

## 2022-10-21 NOTE — H&P (Signed)
Chase Edwards is an 57 y.o. male.   Chief Complaint: cough, possible GERD HPI: 57 y/o M with PMH asthma, obstructive sleep apnea, CAD status post CABG, chronic GERD, hypertension.  History of coronary artery stent(s) with subsequent CABG , coming for evaluation of cough and possible GERD.  States having persisting cough that cause choking spells. No frequent heartburn. Takes PPI compliantly.  Past Medical History:  Diagnosis Date   Asthma    Coronary artery disease    GERD (gastroesophageal reflux disease)    Hypertension    Sleep apnea     Past Surgical History:  Procedure Laterality Date   APPENDECTOMY     CORONARY ARTERY BYPASS GRAFT     CORONARY STENT INTERVENTION     LEFT HEART CATH AND CORS/GRAFTS ANGIOGRAPHY N/A 12/24/2020   Procedure: LEFT HEART CATH AND CORS/GRAFTS ANGIOGRAPHY;  Surgeon: Yvonne Kendall, MD;  Location: [facility];  Service: Cardiovascular;  Laterality: N/A;    Family History  Problem Relation Age of Onset   Colon cancer Neg Hx    Social History:  reports that he has never smoked. He has never been exposed to tobacco smoke. He has never used smokeless tobacco. He reports that he does not currently use alcohol. He reports that he does not use drugs.  Allergies:  Allergies  Allergen Reactions   Phenergan [Promethazine]     Seizures and vomiting    Medications Prior to Admission  Medication Sig Dispense Refill   albuterol (VENTOLIN HFA) 108 (90 Base) MCG/ACT inhaler Inhale 1 puff into the lungs every 6 (six) hours as needed for shortness of breath.     aspirin 81 MG chewable tablet Chew 1 tablet (81 mg total) by mouth daily. 90 tablet 1   BREZTRI AEROSPHERE 160-9-4.8 MCG/ACT AERO Inhale 2 puffs into the lungs in the morning and at bedtime.     ezetimibe (ZETIA) 10 MG tablet Take 10 mg by mouth daily.     famotidine (PEPCID) 20 MG tablet Take 40 mg by mouth 2 (two) times daily.     loratadine (CLARITIN) 10 MG tablet Take 1 tablet by mouth daily.      losartan (COZAAR) 50 MG tablet Take by mouth.     metoprolol succinate (TOPROL-XL) 25 MG 24 hr tablet Take 12.5 mg by mouth daily.     pantoprazole (PROTONIX) 40 MG tablet TAKE 1 TABLET (40 MG TOTAL) BY MOUTH TWICE A DAY BEFORE MEALS 180 tablet 1   pravastatin (PRAVACHOL) 80 MG tablet Take 80 mg by mouth daily as needed.     ipratropium-albuterol (DUONEB) 0.5-2.5 (3) MG/3ML SOLN Inhale 3 mLs into the lungs every 12 (twelve) hours as needed (shortness of breath).     nitroGLYCERIN (NITROSTAT) 0.4 MG SL tablet Place 0.4 mg under the tongue every 5 (five) minutes as needed for chest pain.     sildenafil (VIAGRA) 100 MG tablet 1/2-1 tab po prn 30 tablet prn    No results found for this or any previous visit (from the past 48 hour(s)). No results found.  Review of Systems  Respiratory:  Positive for cough.   All other systems reviewed and are negative.   Blood pressure 133/89, pulse 71, temperature 97.8 F (36.6 C), resp. rate 14, SpO2 98 %. Physical Exam  GENERAL: The patient is AO x3, in no acute distress. HEENT: Head is normocephalic and atraumatic. EOMI are intact. Mouth is well hydrated and without lesions. NECK: Supple. No masses LUNGS: Clear to auscultation. No presence  of rhonchi/wheezing/rales. Adequate chest expansion HEART: RRR, normal s1 and s2. ABDOMEN: Soft, nontender, no guarding, no peritoneal signs, and nondistended. BS +. No masses. EXTREMITIES: Without any cyanosis, clubbing, rash, lesions or edema. NEUROLOGIC: AOx3, no focal motor deficit. SKIN: no jaundice, no rashes  Assessment/Plan  57 y/o M with PMH asthma, obstructive sleep apnea, CAD status post CABG, chronic GERD, hypertension.  History of coronary artery stent(s) with subsequent CABG , coming for evaluation of cough and possible GERD. We will proceed with EGD.  Dolores Frame, MD 10/21/2022, 7:30 AM

## 2022-10-21 NOTE — Op Note (Signed)
[facility] [name] Procedure Date: 10/21/2022 7:11 AM MRN: [id number] Date of Birth: 11-23-1965 Attending MD: Katrinka Blazing , , [id number] CSN: [id number] Age: 57 Admit Type: Outpatient Procedure:                Upper GI endoscopy Indications:              Suspected gastro-esophageal reflux disease Providers:                Katrinka Blazing, Nena Polio, RN, Lennice Sites                            Technician, Technician Referring MD:              Medicines:                Monitored Anesthesia Care Complications:            No immediate complications. Estimated Blood Loss:     Estimated blood loss: none. Procedure:                Pre-Anesthesia Assessment:                           - Prior to the procedure, a History and Physical                            was performed, and patient medications, allergies                            and sensitivities were reviewed. The patient's                            tolerance of previous anesthesia was reviewed.                           - The risks and benefits of the procedure and the                            sedation options and risks were discussed with the                            patient. All questions were answered and informed                            consent was obtained.                           - ASA Grade Assessment: III - A patient with severe                            systemic disease.                           After obtaining informed consent, the endoscope was                            passed under direct vision. Throughout the  procedure, the patient's blood pressure, pulse, and                            oxygen saturations were monitored continuously. The                            GIF-H190 ([id number]) scope was introduced through the                            mouth, and advanced to the second part of duodenum.                            The upper GI endoscopy was  accomplished without                            difficulty. The patient tolerated the procedure                            well. Scope In: 7:37:47 AM Scope Out: 7:44:13 AM Total Procedure Duration: 0 hours 6 minutes 26 seconds  Findings:      The Z-line was irregular and was found 40 cm from the incisors. Biopsies       were taken with a cold forceps for histology.      The gastroesophageal flap valve was visualized endoscopically and       classified as Hill Grade II (fold present, opens with respiration).      The stomach was normal.      A single 4 mm mucosal nodule with a localized distribution was found in       the duodenal bulb. Biopsies were taken with a cold forceps for histology. Impression:               - Z-line irregular, 40 cm from the incisors.                            Biopsied.                           - Normal stomach.                           - Mucosal nodule found in the duodenum. Biopsied. Moderate Sedation:      Per Anesthesia Care Recommendation:           - Discharge patient to home (ambulatory).                           - Resume previous diet.                           - Await pathology results.                           - Continue present medications.                           - Proceed with pH impedance testing off PPI.                           -  Transoral Incisionless Fundoplication (TIF)                            pamphlet provided. Procedure Code(s):        --- Professional ---                           [phone], Esophagogastroduodenoscopy, flexible,                            transoral; with biopsy, single or multiple Diagnosis Code(s):        --- Professional ---                           K22.89, Other specified disease of esophagus                           K31.89, Other diseases of stomach and duodenum CPT copyright 2022 American Medical Association. All rights reserved. The codes documented in this report are preliminary and upon coder review may   be revised to meet current compliance requirements. Katrinka Blazing, MD Katrinka Blazing,  10/21/2022 7:53:17 AM This report has been signed electronically. Number of Addenda: 0

## 2022-10-21 NOTE — Discharge Instructions (Signed)
You are being discharged to home.  Resume your previous diet.  We are waiting for your pathology results.  Continue your present medications.  Proceed with pH impedance testing off PPI. Transoral Incisionless Fundoplication (TIF) pamphlet provided.

## 2022-10-21 NOTE — Transfer of Care (Signed)
Immediate Anesthesia Transfer of Care Note  [name]  Procedure(s) Performed: ESOPHAGOGASTRODUODENOSCOPY (EGD) WITH PROPOFOL BIOPSY  Patient Location: Short Stay  Anesthesia Type:General  Level of Consciousness: drowsy  Airway & Oxygen Therapy: Patient Spontanous Breathing  Post-op Assessment: Report given to RN, Post -op Vital signs reviewed and stable, and Patient moving all extremities X 4  Post vital signs: Reviewed and stable  Last Vitals:  Vitals Value Taken Time  BP    Temp    Pulse    Resp    SpO2      Last Pain:  Vitals:   10/21/22 0735  PainSc: 0-No pain      Patients Stated Pain Goal: 7 (10/21/22 0735)  Complications: No notable events documented.

## 2022-10-21 NOTE — Anesthesia Procedure Notes (Signed)
Date/Time: 10/21/2022 7:39 AM  Performed by: Julian Reil, CRNAPre-anesthesia Checklist: Emergency Drugs available, Patient identified, Suction available and Patient being monitored Oxygen Delivery Method: Nasal cannula Induction Type: IV induction Placement Confirmation: positive ETCO2

## 2022-10-21 NOTE — Anesthesia Preprocedure Evaluation (Signed)
Anesthesia Evaluation  Patient identified by MRN, date of birth, ID band Patient awake    Reviewed: Allergy & Precautions, H&P , NPO status , Patient's Chart, lab work & pertinent test results, reviewed documented beta blocker date and time   Airway Mallampati: II  TM Distance: >3 FB Neck ROM: full    Dental no notable dental hx.    Pulmonary neg pulmonary ROS, asthma , sleep apnea    Pulmonary exam normal breath sounds clear to auscultation       Cardiovascular Exercise Tolerance: Good hypertension, + angina  + CAD, + Cardiac Stents and + CABG   Rhythm:regular Rate:Normal     Neuro/Psych negative neurological ROS  negative psych ROS   GI/Hepatic negative GI ROS, Neg liver ROS,GERD  ,,  Endo/Other  negative endocrine ROS    Renal/GU negative Renal ROS  negative genitourinary   Musculoskeletal   Abdominal   Peds  Hematology negative hematology ROS (+)   Anesthesia Other Findings   Reproductive/Obstetrics negative OB ROS                             Anesthesia Physical Anesthesia Plan  ASA: 3  Anesthesia Plan: General   Post-op Pain Management:    Induction:   PONV Risk Score and Plan: Propofol infusion  Airway Management Planned:   Additional Equipment:   Intra-op Plan:   Post-operative Plan:   Informed Consent: I have reviewed the patients History and Physical, chart, labs and discussed the procedure including the risks, benefits and alternatives for the proposed anesthesia with the patient or authorized representative who has indicated his/her understanding and acceptance.     Dental Advisory Given  Plan Discussed with: CRNA  Anesthesia Plan Comments:        Anesthesia Quick Evaluation

## 2022-10-23 NOTE — Anesthesia Postprocedure Evaluation (Signed)
Anesthesia Post Note  [name]  Procedure(s) Performed: ESOPHAGOGASTRODUODENOSCOPY (EGD) WITH PROPOFOL BIOPSY  Patient location during evaluation: Phase II Anesthesia Type: General Level of consciousness: awake Pain management: pain level controlled Vital Signs Assessment: post-procedure vital signs reviewed and stable Respiratory status: spontaneous breathing and respiratory function stable Cardiovascular status: blood pressure returned to baseline and stable Postop Assessment: no headache and no apparent nausea or vomiting Anesthetic complications: no Comments: Late entry   No notable events documented.   Last Vitals:  Vitals:   10/21/22 0647 10/21/22 0751  BP: 133/89 118/74  Pulse: 71 67  Resp: 14 14  Temp: 36.6 C (!) 36.4 C  SpO2: 98% 93%    Last Pain:  Vitals:   10/21/22 0751  TempSrc: Axillary  PainSc: 0-No pain                 Windell Norfolk

## 2022-10-24 ENCOUNTER — Telehealth (INDEPENDENT_AMBULATORY_CARE_PROVIDER_SITE_OTHER): Payer: Self-pay | Admitting: *Deleted

## 2022-10-24 NOTE — Telephone Encounter (Signed)
Referral has been sent, they will contact patient to schedule

## 2022-10-24 NOTE — Telephone Encounter (Signed)
-----   Message from Dolores Frame, MD sent at 10/21/2022  7:54 AM EDT ----- Casandra Doffing, can you please refer the patient to [facility] for pH impedance testing off PPI.  Thanks,   Katrinka Blazing, MD Gastroenterology and Hepatology [facility] Gastroenterology

## 2022-10-25 LAB — SURGICAL PATHOLOGY

## 2022-10-28 ENCOUNTER — Encounter (HOSPITAL_COMMUNITY): Payer: Self-pay | Admitting: Gastroenterology

## 2022-11-11 ENCOUNTER — Ambulatory Visit (INDEPENDENT_AMBULATORY_CARE_PROVIDER_SITE_OTHER): Payer: BC Managed Care – PPO | Admitting: Neurology

## 2022-11-11 ENCOUNTER — Encounter: Payer: Self-pay | Admitting: Neurology

## 2022-11-11 VITALS — BP 145/87 | HR 80 | Ht 74.0 in | Wt 256.4 lb

## 2022-11-11 DIAGNOSIS — R404 Transient alteration of awareness: Secondary | ICD-10-CM | POA: Diagnosis not present
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
## 2022-11-11 NOTE — Progress Notes (Signed)
Medication Samples have been provided to the patient.  Drug name: briviact       Strength: 100mg         Qty: 2 box  LOT: [id number]  Exp.Date: 09/28/2025  Dosing instructions: take as directed   The patient has been instructed regarding the correct time, dose, and frequency of taking this medication, including desired effects and most common side effects.   [name] 2:07 PM 11/11/2022
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
## 2022-11-21 ENCOUNTER — Telehealth: Payer: Self-pay | Admitting: Neurology

## 2022-11-21 DIAGNOSIS — R404 Transient alteration of awareness: Secondary | ICD-10-CM

## 2022-11-21 MED ORDER — BRIVIACT 50 MG PO TABS: ORAL_TABLET | ORAL | 5 refills | Status: DC

## 2022-11-21 NOTE — Telephone Encounter (Signed)
Pt called in stating he was supposed to be referred to [facility] in [place] for an MRI with contrast, but they do not have a record of it and it's been a week.

## 2022-11-21 NOTE — Telephone Encounter (Signed)
I sent in the Briviact 50mg : Take 1 tablet twice a day. Ok to order MRI brain with contrast to [facility], he had brain MRI without contrast, now just needs with contrast for new onset seizure. Thanks

## 2022-11-21 NOTE — Telephone Encounter (Signed)
See other phone note

## 2022-11-21 NOTE — Telephone Encounter (Signed)
Pt said dr.aquino did not send in his prescription in. He said it was [place]? It was supposed to be sent in to [facility]

## 2022-11-22 NOTE — Telephone Encounter (Signed)
Pt called an advised that Dr Karel Jarvis sent in the Briviact 50mg : Take 1 tablet twice a day. Ok to order MRI brain with contrast to [facility], he had brain MRI without contrast, now just needs with contrast for new onset seizure. MRI order faxed to [facility]

## 2022-11-22 NOTE — Addendum Note (Signed)
Addended by: Dimas Chyle on: 11/22/2022 08:09 AM   Modules accepted: Orders

## 2022-11-23 NOTE — Procedures (Signed)
ELECTROENCEPHALOGRAM REPORT  Date of Study: 11/11/2022  Patient's Name: Chase Edwards MRN: [id number] Date of Birth: 12-21-1965  Referring Provider: Dr. Patrcia Dolly  Clinical History: This is a 57 year old man with recurrent episodes of inability to speak. He had an episode just prior to EEG, stat EEG ordered after patient had event in office.   Medications: Aspirin, Zetia, Pravachol, Toprol, Cozaar  Technical Summary: A multichannel digital 1-hour EEG recording measured by the international 10-20 system with electrodes applied with paste and impedances below 5000 ohms performed in our laboratory with EKG monitoring in an awake and asleep patient.  Hyperventilation was not performed. Photic stimulation was performed.  The digital EEG was referentially recorded, reformatted, and digitally filtered in a variety of bipolar and referential montages for optimal display.    Description: The patient is awake and asleep during the recording.  During maximal wakefulness, there is a symmetric, medium voltage 10 Hz posterior dominant rhythm that attenuates with eye opening.  The record is symmetric.  During drowsiness and sleep, there is an increase in theta slowing of the background.  Vertex waves and symmetric sleep spindles were seen. Hyperventilation and photic stimulation did not elicit any abnormalities.  There were no focal slowing, no epileptiform discharges or electrographic seizures seen.    EKG lead was unremarkable.  Impression: This 1-hour awake and asleep EEG is normal.    Clinical Correlation: A normal EEG does not exclude a clinical diagnosis of epilepsy.  If further clinical questions remain, prolonged EEG may be helpful.  Clinical correlation is advised.   Patrcia Dolly, M.D.

## 2022-11-24 ENCOUNTER — Telehealth: Payer: Self-pay

## 2022-11-24 NOTE — Telephone Encounter (Signed)
Pt MRI Brain was approved case number [id number] good from 11/24/22 until 12/23/22

## 2022-11-28 ENCOUNTER — Observation Stay (HOSPITAL_COMMUNITY): Payer: BC Managed Care – PPO

## 2022-11-28 ENCOUNTER — Emergency Department (HOSPITAL_COMMUNITY): Payer: BC Managed Care – PPO

## 2022-11-28 ENCOUNTER — Inpatient Hospital Stay (HOSPITAL_COMMUNITY)
Admission: EM | Admit: 2022-11-28 | Discharge: 2022-11-30 | DRG: 103 | Disposition: A | Discharge status: Home / Self Care | Payer: BC Managed Care – PPO | Attending: Family Medicine | Admitting: Family Medicine

## 2022-11-28 ENCOUNTER — Encounter (HOSPITAL_COMMUNITY): Payer: Self-pay

## 2022-11-28 ENCOUNTER — Telehealth: Payer: Self-pay | Admitting: Neurology

## 2022-11-28 ENCOUNTER — Other Ambulatory Visit: Payer: Self-pay

## 2022-11-28 DIAGNOSIS — G43109 Migraine with aura, not intractable, without status migrainosus: Principal | ICD-10-CM | POA: Diagnosis present

## 2022-11-28 DIAGNOSIS — Z6832 Body mass index (BMI) 32.0-32.9, adult: Secondary | ICD-10-CM

## 2022-11-28 DIAGNOSIS — F4310 Post-traumatic stress disorder, unspecified: Secondary | ICD-10-CM | POA: Diagnosis present

## 2022-11-28 DIAGNOSIS — K219 Gastro-esophageal reflux disease without esophagitis: Secondary | ICD-10-CM | POA: Diagnosis present

## 2022-11-28 DIAGNOSIS — Z951 Presence of aortocoronary bypass graft: Secondary | ICD-10-CM

## 2022-11-28 DIAGNOSIS — F4481 Dissociative identity disorder: Secondary | ICD-10-CM | POA: Diagnosis present

## 2022-11-28 DIAGNOSIS — R4182 Altered mental status, unspecified: Principal | ICD-10-CM

## 2022-11-28 DIAGNOSIS — Z1152 Encounter for screening for COVID-19: Secondary | ICD-10-CM

## 2022-11-28 DIAGNOSIS — G4730 Sleep apnea, unspecified: Secondary | ICD-10-CM | POA: Diagnosis present

## 2022-11-28 DIAGNOSIS — Z62810 Personal history of physical and sexual abuse in childhood: Secondary | ICD-10-CM

## 2022-11-28 DIAGNOSIS — I10 Essential (primary) hypertension: Secondary | ICD-10-CM | POA: Diagnosis present

## 2022-11-28 DIAGNOSIS — E669 Obesity, unspecified: Secondary | ICD-10-CM | POA: Diagnosis present

## 2022-11-28 DIAGNOSIS — Z79899 Other long term (current) drug therapy: Secondary | ICD-10-CM

## 2022-11-28 DIAGNOSIS — F419 Anxiety disorder, unspecified: Secondary | ICD-10-CM | POA: Diagnosis present

## 2022-11-28 DIAGNOSIS — Z955 Presence of coronary angioplasty implant and graft: Secondary | ICD-10-CM

## 2022-11-28 DIAGNOSIS — J4520 Mild intermittent asthma, uncomplicated: Secondary | ICD-10-CM | POA: Diagnosis present

## 2022-11-28 DIAGNOSIS — I2510 Atherosclerotic heart disease of native coronary artery without angina pectoris: Secondary | ICD-10-CM | POA: Diagnosis not present

## 2022-11-28 DIAGNOSIS — R569 Unspecified convulsions: Secondary | ICD-10-CM

## 2022-11-28 DIAGNOSIS — Z888 Allergy status to other drugs, medicaments and biological substances status: Secondary | ICD-10-CM

## 2022-11-28 DIAGNOSIS — Z7982 Long term (current) use of aspirin: Secondary | ICD-10-CM

## 2022-11-28 DIAGNOSIS — G4733 Obstructive sleep apnea (adult) (pediatric): Secondary | ICD-10-CM | POA: Diagnosis present

## 2022-11-28 DIAGNOSIS — E785 Hyperlipidemia, unspecified: Secondary | ICD-10-CM | POA: Diagnosis present

## 2022-11-28 LAB — CBC
HCT: 47.8 % (ref 39.0–52.0)
Hemoglobin: 15.9 g/dL (ref 13.0–17.0)
MCH: 30 pg (ref 26.0–34.0)
MCHC: 33.3 g/dL (ref 30.0–36.0)
MCV: 90.2 fL (ref 80.0–100.0)
Platelets: 260 10*3/uL (ref 150–400)
RBC: 5.3 MIL/uL (ref 4.22–5.81)
RDW: 11.9 % (ref 11.5–15.5)
WBC: 7.8 10*3/uL (ref 4.0–10.5)
nRBC: 0 % (ref 0.0–0.2)

## 2022-11-28 LAB — BASIC METABOLIC PANEL
Anion gap: 12 (ref 5–15)
BUN: 18 mg/dL (ref 6–20)
CO2: 23 mmol/L (ref 22–32)
Calcium: 9.1 mg/dL (ref 8.9–10.3)
Chloride: 103 mmol/L (ref 98–111)
Creatinine, Ser: 1.17 mg/dL (ref 0.61–1.24)
GFR, Estimated: >60 mL/min (ref >60)
Glucose, Bld: 94 mg/dL (ref 70–99)
Potassium: 3.9 mmol/L (ref 3.5–5.1)
Sodium: 138 mmol/L (ref 135–145)

## 2022-11-28 LAB — RAPID URINE DRUG SCREEN, HOSP PERFORMED
Amphetamines: NOT DETECTED
Barbiturates: NOT DETECTED
Benzodiazepines: NOT DETECTED
Cocaine: NOT DETECTED
Opiates: NOT DETECTED
Tetrahydrocannabinol: NOT DETECTED

## 2022-11-28 LAB — URINALYSIS, ROUTINE W REFLEX MICROSCOPIC
Bilirubin Urine: NEGATIVE
Glucose, UA: NEGATIVE mg/dL
Hgb urine dipstick: NEGATIVE
Ketones, ur: NEGATIVE mg/dL
Leukocytes,Ua: NEGATIVE
Nitrite: NEGATIVE
Protein, ur: NEGATIVE mg/dL
Specific Gravity, Urine: 1.023 (ref 1.005–1.030)
pH: 6 (ref 5.0–8.0)

## 2022-11-28 LAB — HIV ANTIBODY (ROUTINE TESTING W REFLEX): HIV Screen 4th Generation wRfx: NONREACTIVE

## 2022-11-28 LAB — MAGNESIUM: Magnesium: 1.9 mg/dL (ref 1.7–2.4)

## 2022-11-28 MED ORDER — POLYETHYLENE GLYCOL 3350 17 G PO PACK: 17.0000 g | PACK | Freq: Every day | ORAL | Status: DC | PRN

## 2022-11-28 MED ORDER — BRIVARACETAM 25 MG PO TABS
75.0000 mg | ORAL_TABLET | Freq: Two times a day (BID) | ORAL | Status: DC
  Administered 2022-11-28 – 2022-11-29 (×2): 75 mg via ORAL
  Filled 2022-11-28 (×3): qty 3

## 2022-11-28 MED ORDER — EZETIMIBE 10 MG PO TABS
10.0000 mg | ORAL_TABLET | Freq: Every day | ORAL | Status: DC
  Administered 2022-11-29 – 2022-11-30 (×2): 10 mg via ORAL
  Filled 2022-11-28 (×2): qty 1

## 2022-11-28 MED ORDER — SODIUM CHLORIDE 0.9% FLUSH
3.0000 mL | Freq: Two times a day (BID) | INTRAVENOUS | Status: DC
  Administered 2022-11-28 – 2022-11-29 (×3): 3 mL via INTRAVENOUS

## 2022-11-28 MED ORDER — PANTOPRAZOLE SODIUM 40 MG PO TBEC
40.0000 mg | DELAYED_RELEASE_TABLET | Freq: Two times a day (BID) | ORAL | Status: DC
  Administered 2022-11-28 – 2022-11-30 (×4): 40 mg via ORAL
  Filled 2022-11-28 (×4): qty 1

## 2022-11-28 MED ORDER — BUDESON-GLYCOPYRROL-FORMOTEROL 160-9-4.8 MCG/ACT IN AERO: 2.0000 | INHALATION_SPRAY | Freq: Two times a day (BID) | RESPIRATORY_TRACT | Status: DC

## 2022-11-28 MED ORDER — MOMETASONE FURO-FORMOTEROL FUM 200-5 MCG/ACT IN AERO
2.0000 | INHALATION_SPRAY | Freq: Two times a day (BID) | RESPIRATORY_TRACT | Status: DC
  Administered 2022-11-29 – 2022-11-30 (×3): 2 via RESPIRATORY_TRACT
  Filled 2022-11-28: qty 8.8

## 2022-11-28 MED ORDER — METOPROLOL SUCCINATE ER 25 MG PO TB24: 50.0000 mg | ORAL_TABLET | Freq: Every day | ORAL | Status: DC

## 2022-11-28 MED ORDER — LOSARTAN POTASSIUM 50 MG PO TABS
50.0000 mg | ORAL_TABLET | Freq: Every day | ORAL | Status: DC
  Administered 2022-11-29 – 2022-11-30 (×2): 50 mg via ORAL
  Filled 2022-11-28 (×2): qty 1

## 2022-11-28 MED ORDER — ALBUTEROL SULFATE (2.5 MG/3ML) 0.083% IN NEBU: 3.0000 mL | INHALATION_SOLUTION | Freq: Four times a day (QID) | RESPIRATORY_TRACT | Status: DC | PRN

## 2022-11-28 MED ORDER — ASPIRIN 81 MG PO CHEW
81.0000 mg | CHEWABLE_TABLET | Freq: Every day | ORAL | Status: DC
  Administered 2022-11-29 – 2022-11-30 (×2): 81 mg via ORAL
  Filled 2022-11-28 (×2): qty 1

## 2022-11-28 MED ORDER — GADOBUTROL 1 MMOL/ML IV SOLN
10.0000 mL | Freq: Once | INTRAVENOUS | Status: AC | PRN
  Administered 2022-11-28: 10 mL via INTRAVENOUS

## 2022-11-28 MED ORDER — METOPROLOL SUCCINATE ER 50 MG PO TB24
50.0000 mg | ORAL_TABLET | Freq: Every day | ORAL | Status: DC
  Administered 2022-11-29 – 2022-11-30 (×2): 50 mg via ORAL
  Filled 2022-11-28 (×2): qty 1

## 2022-11-28 MED ORDER — ACETAMINOPHEN 500 MG PO TABS
1000.0000 mg | ORAL_TABLET | Freq: Once | ORAL | Status: AC
  Administered 2022-11-28: 1000 mg via ORAL
  Filled 2022-11-28: qty 2

## 2022-11-28 MED ORDER — ACETAMINOPHEN 325 MG PO TABS
650.0000 mg | ORAL_TABLET | Freq: Four times a day (QID) | ORAL | Status: DC | PRN
  Administered 2022-11-29 – 2022-11-30 (×2): 650 mg via ORAL
  Filled 2022-11-28 (×2): qty 2

## 2022-11-28 MED ORDER — ACETAMINOPHEN 650 MG RE SUPP: 650.0000 mg | Freq: Four times a day (QID) | RECTAL | Status: DC | PRN

## 2022-11-28 MED ORDER — UMECLIDINIUM BROMIDE 62.5 MCG/ACT IN AEPB
1.0000 | INHALATION_SPRAY | Freq: Every day | RESPIRATORY_TRACT | Status: DC
  Administered 2022-11-29 – 2022-11-30 (×2): 1 via RESPIRATORY_TRACT
  Filled 2022-11-28: qty 7

## 2022-11-28 NOTE — Telephone Encounter (Signed)
New message    Pt c/o medication issue:  1. Name of Medication: Brivaracetam (BRIVIACT) 50 MG TABS   2. How are you currently taking this medication (dosage and times per day)? Per wife one in am and one in pm    3. Are you having a reaction (difficulty breathing--STAT)? No   4. What is your medication issue? Came home from work today this morning / not feeling well & fuzzy, something not right.

## 2022-11-28 NOTE — ED Provider Notes (Signed)
[facility] AT [facility] Provider Note   CSN: [id number] Arrival date & time: 11/28/22  1020     History  Chief Complaint  Patient presents with   Seizures    Chase Edwards is a 57 y.o. male.  With PMH of CAD, GERD, OSA, currently being worked up for seizures by neurology Dr. Karel Jarvis presents after prolonged confused and "staring" episode that began around 8:30 AM today and eventually resolved by time patient arrived to ER around 1230 noon.  Patient just started taking Briviact for seizures and took all of his home meds this morning.  He is complaining of a diffuse pressure-like headache after this episode which he notes happens every time after these episodes.  They have been happening over the past few months and becoming more frequent and longer acting in nature.  During the episodes, he is confused and staring off and does not respond to much stimulus.  Unsure of his name or the names of other's or where he is or any other relevant information.  His wife does note he will sometimes respond to her but is still confused and does not know anything going on around him.  He will get loss for prolonged periods of time.  He has no shaking episodes, no tongue biting, no injuries, no loss of bladder or bowels.  Patient has no complaints of chest pain, shortness of breath, recent fevers or illness.  He denies any drug or alcohol use.   Seizures      Home Medications Prior to Admission medications   Medication Sig Start Date End Date Taking? Authorizing Provider  albuterol (VENTOLIN HFA) 108 (90 Base) MCG/ACT inhaler Inhale 1 puff into the lungs every 6 (six) hours as needed for shortness of breath. 07/21/20   [provider]  aspirin 81 MG chewable tablet Chew 1 tablet (81 mg total) by mouth daily. 12/25/20   Cyndi Bender, NP  BREZTRI AEROSPHERE 160-9-4.8 MCG/ACT AERO Inhale 2 puffs into the lungs in the morning and at bedtime.    [provider]   Brivaracetam (BRIVIACT) 50 MG TABS Take 1 tablet twice a day 11/21/22   Van Clines, MD  ezetimibe (ZETIA) 10 MG tablet Take 10 mg by mouth daily.    [provider]  famotidine (PEPCID) 20 MG tablet Take 40 mg by mouth 2 (two) times daily.    [provider]  ipratropium-albuterol (DUONEB) 0.5-2.5 (3) MG/3ML SOLN Inhale 3 mLs into the lungs every 12 (twelve) hours as needed (shortness of breath). 07/05/22   [provider]  loratadine (CLARITIN) 10 MG tablet Take 1 tablet by mouth daily.    [provider]  losartan (COZAAR) 50 MG tablet Take by mouth. 10/25/21   [provider]  metoprolol succinate (TOPROL-XL) 25 MG 24 hr tablet Take 50 mg by mouth daily. 12/21/20   [provider]  nitroGLYCERIN (NITROSTAT) 0.4 MG SL tablet Place 0.4 mg under the tongue every 5 (five) minutes as needed for chest pain. 12/22/20   [provider]  pantoprazole (PROTONIX) 40 MG tablet TAKE 1 TABLET (40 MG TOTAL) BY MOUTH TWICE A DAY BEFORE MEALS 10/03/22   Tiffany Kocher, PA-C  pravastatin (PRAVACHOL) 80 MG tablet Take 80 mg by mouth daily as needed. 03/09/21   [provider]  sildenafil (VIAGRA) 100 MG tablet 1/2-1 tab po prn 08/02/22   Marcine Matar, MD      Allergies    Phenergan [promethazine]  Review of Systems   Review of Systems  Neurological:  Positive for seizures.    Physical Exam Updated Vital Signs BP 125/79 (BP Location: Right Arm)   Pulse 61   Temp 97.9 F (36.6 C)   Resp 16   Ht 6\' 2"  (1.88 m)   Wt 115.7 kg   SpO2 97%   BMI 32.74 kg/m  Physical Exam Constitutional: Alert and oriented.  GCS 15 no acute distress Eyes: Conjunctivae are normal. ENT      Head: Normocephalic and atraumatic.Marland Kitchen      Mouth/Throat: No tongue bites Cardiovascular: S1, S2, regular rate Respiratory: Normal respiratory effort.  O2 sat 97 on RA Gastrointestinal: Nondistended Musculoskeletal: Normal range of motion in all  extremities. Neurologic: Normal speech and language.  PERRL.  EOMI.  No facial droop.  Tongue midline.  5 out of 5 strength bilateral upper and lower extremities.  Sensation grossly intact.  GCS 15.  AAOx4. No gross focal neurologic deficits are appreciated. Skin: Skin is warm, dry and intact. No rash noted. Psychiatric: Mood and affect are normal. Speech and behavior are normal.  ED Results / Procedures / Treatments   Labs (all labs ordered are listed, but only abnormal results are displayed) Labs Reviewed  BASIC METABOLIC PANEL  CBC  MAGNESIUM  URINALYSIS, ROUTINE W REFLEX MICROSCOPIC  RAPID URINE DRUG SCREEN, HOSP PERFORMED  HIV ANTIBODY (ROUTINE TESTING W REFLEX)    EKG EKG Interpretation  Date/Time:  Monday November 28 2022 15:01:05 EDT Ventricular Rate:  56 PR Interval:  203 QRS Duration: 111 QT Interval:  420 QTC Calculation: 406 R Axis:   126 Text Interpretation: Sinus rhythm Borderline prolonged PR interval Right axis deviation No significant change since last tracing Confirmed by Vivien Rossetti ([id number]) on 11/28/2022 3:05:52 PM  Radiology No results found.  Procedures Procedures    Medications Ordered in ED Medications  brivaracetam (BRIVIACT) tablet 75 mg (75 mg Oral Given 11/28/22 1532)  losartan (COZAAR) tablet 50 mg (has no administration in time range)  ezetimibe (ZETIA) tablet 10 mg (has no administration in time range)  aspirin chewable tablet 81 mg (has no administration in time range)  pantoprazole (PROTONIX) EC tablet 40 mg (has no administration in time range)  Budeson-Glycopyrrol-Formoterol 160-9-4.8 MCG/ACT AERO 2 puff (has no administration in time range)  albuterol (VENTOLIN HFA) 108 (90 Base) MCG/ACT inhaler 1 puff (has no administration in time range)  sodium chloride flush (NS) 0.9 % injection 3 mL (has no administration in time range)  acetaminophen (TYLENOL) tablet 650 mg (has no administration in time range)    Or  acetaminophen (TYLENOL)  suppository 650 mg (has no administration in time range)  polyethylene glycol (MIRALAX / GLYCOLAX) packet 17 g (has no administration in time range)  metoprolol succinate (TOPROL-XL) 24 hr tablet 50 mg (has no administration in time range)  acetaminophen (TYLENOL) tablet 1,000 mg (1,000 mg Oral Given 11/28/22 1331)    ED Course/ Medical Decision Making/ A&P Clinical Course as of 11/28/22 1540  Mon Nov 28, 2022  1232 S/w Dr Viviann Spare on-call neurologist will be down to evaluate the patient and provide further recommendations.  He will put in spot EEG now.  I have ordered for MRI brain with and without contrast as planned. [VB]  1523 Patient had abnormal findings on spot EEG, neurology recommending admission for overnight EEG, MRI brain with and without contrast and they have ordered for increased dosing of his Briviact. [VB]    Clinical Course User Index [VB] Elpidio Anis,  Constance Goltz, MD                             Medical Decision Making  Joangel Smathers is a 57 y.o. male.  With PMH of CAD, GERD, OSA, currently being worked up for seizures by neurology Dr. Karel Jarvis presents after prolonged confused and "staring" episode that began around 8:30 AM today and eventually resolved by time patient arrived to ER around 1230 noon.   When patient presented to the ER, previous episode resolved.  On my exam, he is AAO x 4 and neurologically intact which makes me have low suspicion for CVA.  EKG was obtained which I personally reviewed sinus rhythm with no acute ST/T changes concerning for ischemia and normal QTc.  With no chest pain or cardiopulmonary complaints, doubt cardiopulmonary source.  Presentation could be secondary to atypical seizure activity, transient global amnesia, metabolic derangements among multiple other etiologies.  Labs reviewed by me generally unremarkable with normal sodium 138, potassium 3.9, glucose 94.  No headache no neck pain no infectious symptoms normal white blood count 7.8, not concern  for meningitis.  Consulted Dr.Palikh on-call neurologist who had ordered spot EEG for patient which showed abnormal findings.  Would like to be admitted overnight for continuous EEG, increase Briviact, seizure precautions and MRI brain with and without contrast.  Discussed with on-call hospitalist Dr. Alinda Money for admission for above.  Amount and/or Complexity of Data Reviewed Labs: ordered. Radiology: ordered.  Risk OTC drugs. Decision regarding hospitalization.      Final Clinical Impression(s) / ED Diagnoses Final diagnoses:  Altered mental status, unspecified altered mental status type  Seizures [facility])    Rx / DC Orders ED Discharge Orders     None         Mardene Sayer, MD 11/28/22 1541

## 2022-11-28 NOTE — ED Notes (Signed)
ED TO INPATIENT HANDOFF REPORT  ED Nurse Name and Phone #:  Marisue Ivan [phone]  S Name/Age/Gender Chase Edwards 57 y.o. male Room/Bed: 009C/009C  Code Status   Code Status: Full Code  Home/SNF/Other Home Patient oriented to: self, place, time, and situation Is this baseline? Yes   Triage Complete: Triage complete  Chief Complaint Seizures [facility]) [R56.9]  Triage Note Pt had been having seizures, was seen at [facility]. Pt placed on Briviac by Dr Mina Marble (neurology). Pt has a seizure this morning around 0830. Pt is due to have a MRI but has not been scheduled. Pt is unable to answer questions (postictal), all information from wife.    Allergies Allergies  Allergen Reactions   Phenergan [Promethazine]     Seizures and vomiting    Level of Care/Admitting Diagnosis ED Disposition     ED Disposition  Admit   Condition  --   Comment  Hospital Area: [facility] [[id number]]  Level of Care: Telemetry Medical [104]  May place patient in observation at [facility] or [facility] if equivalent level of care is available:: No  Covid Evaluation: Asymptomatic - no recent exposure (last 10 days) testing not required  Diagnosis: Seizures [facility]) [[id number]]  Admitting Physician: Synetta Fail [[id number]]  Attending Physician: Synetta Fail [[id number]]          B Medical/Surgery History Past Medical History:  Diagnosis Date   Asthma    Coronary artery disease    GERD (gastroesophageal reflux disease)    Hypertension    Sleep apnea    Past Surgical History:  Procedure Laterality Date   APPENDECTOMY     BIOPSY  10/21/2022   Procedure: BIOPSY;  Surgeon: Dolores Frame, MD;  Location: [facility] ENDO SUITE;  Service: Gastroenterology;;   CORONARY ARTERY BYPASS GRAFT     CORONARY STENT INTERVENTION     ESOPHAGOGASTRODUODENOSCOPY (EGD) WITH PROPOFOL N/A 10/21/2022   Procedure: ESOPHAGOGASTRODUODENOSCOPY (EGD) WITH PROPOFOL;  Surgeon: Dolores Frame, MD;   Location: [facility] ENDO SUITE;  Service: Gastroenterology;  Laterality: N/A;  7:30 AM   LEFT HEART CATH AND CORS/GRAFTS ANGIOGRAPHY N/A 12/24/2020   Procedure: LEFT HEART CATH AND CORS/GRAFTS ANGIOGRAPHY;  Surgeon: Yvonne Kendall, MD;  Location: [facility] INVASIVE CV LAB;  Service: Cardiovascular;  Laterality: N/A;     A IV Location/Drains/Wounds Patient Lines/Drains/Airways Status     Active Line/Drains/Airways     Name Placement date Placement time Site Days   Peripheral IV 11/28/22 20 G Anterior;Left Wrist 11/28/22  1248  Wrist  less than 1            Intake/Output Last 24 hours No intake or output data in the 24 hours ending 11/28/22 1848  Labs/Imaging Results for orders placed or performed during the hospital encounter of 11/28/22 (from the past 48 hour(s))  Basic metabolic panel - if new onset seizures     Status: None   Collection Time: 11/28/22 11:43 AM  Result Value Ref Range   Sodium 138 135 - 145 mmol/L   Potassium 3.9 3.5 - 5.1 mmol/L   Chloride 103 98 - 111 mmol/L   CO2 23 22 - 32 mmol/L   Glucose, Bld 94 70 - 99 mg/dL    Comment: Glucose reference range applies only to samples taken after fasting for at least 8 hours.   BUN 18 6 - 20 mg/dL   Creatinine, Ser 7.82 0.61 - 1.24 mg/dL   Calcium 9.1 8.9 - 95.6 mg/dL   GFR, Estimated >  60 >60 mL/min    Comment: (NOTE) Calculated using the CKD-EPI Creatinine Equation (2021)    Anion gap 12 5 - 15    Comment: Performed at [facility], [address] [place], [place] [address]  CBC - if new onset seizures     Status: None   Collection Time: 11/28/22 11:43 AM  Result Value Ref Range   WBC 7.8 4.0 - 10.5 K/uL   RBC 5.30 4.22 - 5.81 MIL/uL   Hemoglobin 15.9 13.0 - 17.0 g/dL   HCT 19.1 47.8 - 29.5 %   MCV 90.2 80.0 - 100.0 fL   MCH 30.0 26.0 - 34.0 pg   MCHC 33.3 30.0 - 36.0 g/dL   RDW 62.1 30.8 - 65.7 %   Platelets 260 150 - 400 K/uL   nRBC 0.0 0.0 - 0.2 %    Comment: Performed at [facility], [address], [place], [place] [address]   No results found.  Pending Labs Unresulted Labs (From admission, onward)     Start     Ordered   11/29/22 0500  Basic metabolic panel  Tomorrow morning,   R        11/28/22 1540   11/29/22 0500  CBC  Tomorrow morning,   R        11/28/22 1540   11/28/22 1531  HIV Antibody (routine testing w rflx)  (HIV Antibody (Routine testing w reflex) panel)  Once,   R        11/28/22 1540   11/28/22 1225  Urinalysis, Routine w reflex microscopic -Urine, Clean Catch  Once,   URGENT       Question:  Specimen Source  Answer:  Urine, Clean Catch   11/28/22 1225   11/28/22 1225  Rapid urine drug screen (hospital performed)  ONCE - STAT,   STAT        11/28/22 1225   11/28/22 1209  Magnesium  Add-on,   AD        11/28/22 1208            Vitals/Pain Today's Vitals   11/28/22 1226 11/28/22 1315 11/28/22 1331 11/28/22 1555  BP: 108/63 122/77 125/79   Pulse:  62 61   Resp:   16   Temp:    97.7 F (36.5 C)  TempSrc:    Oral  SpO2:  99% 97%   Weight:      Height:      PainSc:   5      Isolation Precautions No active isolations  Medications Medications  brivaracetam (BRIVIACT) tablet 75 mg (75 mg Oral Given 11/28/22 1532)  losartan (COZAAR) tablet 50 mg (has no administration in time range)  ezetimibe (ZETIA) tablet 10 mg (has no administration in time range)  aspirin chewable tablet 81 mg (has no administration in time range)  pantoprazole (PROTONIX) EC tablet 40 mg (has no administration in time range)  albuterol (PROVENTIL) (2.5 MG/3ML) 0.083% nebulizer solution 3 mL (has no administration in time range)  sodium chloride flush (NS) 0.9 % injection 3 mL (3 mLs Intravenous Not Given 11/28/22 1544)  acetaminophen (TYLENOL) tablet 650 mg (has no administration in time range)    Or  acetaminophen (TYLENOL) suppository 650 mg (has no administration in time range)  polyethylene glycol (MIRALAX / GLYCOLAX) packet 17 g (has no administration in time range)   metoprolol succinate (TOPROL-XL) 24 hr tablet 50 mg (has no administration in time range)  mometasone-formoterol (DULERA) 200-5 MCG/ACT  inhaler 2 puff (has no administration in time range)    And  umeclidinium bromide (INCRUSE ELLIPTA) 62.5 MCG/ACT 1 puff (has no administration in time range)  acetaminophen (TYLENOL) tablet 1,000 mg (1,000 mg Oral Given 11/28/22 1331)  gadobutrol (GADAVIST) 1 MMOL/ML injection 10 mL (10 mLs Intravenous Contrast Given 11/28/22 1821)    Mobility walks     Focused Assessments Neuro Assessment Handoff:  Swallow screen pass? Yes  Cardiac Rhythm: Normal sinus rhythm NIH Stroke Scale  Dizziness Present: No Headache Present: Yes Level of Consciousness (1a.)   : Alert, keenly responsive LOC Questions (1b. )   : Answers both questions correctly LOC Commands (1c. )   : Performs both tasks correctly Best Gaze (2. )  : Normal Visual (3. )  : No visual loss Facial Palsy (4. )    : Normal symmetrical movements Motor Arm, Left (5a. )   : No drift Motor Arm, Right (5b. ) : No drift Motor Leg, Left (6a. )  : No drift Motor Leg, Right (6b. ) : No drift Limb Ataxia (7. ): Absent Sensory (8. )  : Normal, no sensory loss Best Language (9. )  : No aphasia Dysarthria (10. ): Normal Extinction/Inattention (11.)   : No Abnormality Complete NIHSS TOTAL: 0     Neuro Assessment: Within Defined Limits Neuro Checks:      Has TPA been given? No If patient is a Neuro Trauma and patient is going to OR before floor call report to 4N Charge nurse: [phone] or [phone]  , Pulmonary Assessment Handoff:  Lung sounds:   O2 Device: Room Air      R Recommendations: See Admitting Provider Note  Report given to:   Additional Notes:  On 24 hour EEG

## 2022-11-28 NOTE — Telephone Encounter (Signed)
Spoke with pt wife an asked  if he is having any side effects on Briviact and she felt it was helping when we first started it. If helping, increase to 100mg  BID. Pt wife stated that he is in the ER at [facility] now, she will let the MD there know I called and Dr Karel Jarvis stated to increase Briviact to 100 BID. [facility] is [facility] and having Neuro see him,
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
## 2022-11-28 NOTE — Progress Notes (Signed)
EEG complete - results pending 

## 2022-11-28 NOTE — ED Notes (Signed)
MRI tech called. Pt is not claustrophobic, is A&O x4, is not on O2, and is not on any drips.

## 2022-11-28 NOTE — Consult Note (Shared)
Neurology Consultation  Reason for Consult: Episode of altered mental status and amnesia, likely seizure activity Referring Physician: Dr. Elpidio Anis  CC: Episode of confusion and hand amnesia  History is obtained from: Patient, chart and 5  HPI: Chase Edwards is a 57 y.o. male with history of asthma, CAD, GERD, hypertension and sleep apnea who presents after having an episode at work today where he first as though he was unwell, developed a headache, then his vision became foggy and he developed tunnel vision.  He had no memory of the events for several hours after this.  During these episodes, patient is often unable to remember his name, the names of family members and other COVID information.  He has had multiple similar episodes recently, which have often occurred at work.  He was seen by Dr. Karel Jarvis and was started on Briviact.  Since he started the Briviact, he has only had the episode that happened today and no others, but it is causing him to feel quite fatigued.    ROS: A complete ROS was performed and is negative except as noted in the HPI.   Past Medical History:  Diagnosis Date   Asthma    Coronary artery disease    GERD (gastroesophageal reflux disease)    Hypertension    Sleep apnea      Family History  Problem Relation Age of Onset   Colon cancer Neg Hx      Social History:   reports that he has never smoked. He has never been exposed to tobacco smoke. He has never used smokeless tobacco. He reports that he does not currently use alcohol. He reports that he does not use drugs.  Medications No current facility-administered medications for this encounter.  Current Outpatient Medications:    albuterol (VENTOLIN HFA) 108 (90 Base) MCG/ACT inhaler, Inhale 1 puff into the lungs every 6 (six) hours as needed for shortness of breath., Disp: , Rfl:    aspirin 81 MG chewable tablet, Chew 1 tablet (81 mg total) by mouth daily., Disp: 90 tablet, Rfl: 1   BREZTRI AEROSPHERE  160-9-4.8 MCG/ACT AERO, Inhale 2 puffs into the lungs in the morning and at bedtime., Disp: , Rfl:    Brivaracetam (BRIVIACT) 50 MG TABS, Take 1 tablet twice a day, Disp: 60 tablet, Rfl: 5   ezetimibe (ZETIA) 10 MG tablet, Take 10 mg by mouth daily., Disp: , Rfl:    famotidine (PEPCID) 20 MG tablet, Take 40 mg by mouth 2 (two) times daily., Disp: , Rfl:    ipratropium-albuterol (DUONEB) 0.5-2.5 (3) MG/3ML SOLN, Inhale 3 mLs into the lungs every 12 (twelve) hours as needed (shortness of breath)., Disp: , Rfl:    loratadine (CLARITIN) 10 MG tablet, Take 1 tablet by mouth daily., Disp: , Rfl:    losartan (COZAAR) 50 MG tablet, Take by mouth., Disp: , Rfl:    metoprolol succinate (TOPROL-XL) 25 MG 24 hr tablet, Take 50 mg by mouth daily., Disp: , Rfl:    nitroGLYCERIN (NITROSTAT) 0.4 MG SL tablet, Place 0.4 mg under the tongue every 5 (five) minutes as needed for chest pain., Disp: , Rfl:    pantoprazole (PROTONIX) 40 MG tablet, TAKE 1 TABLET (40 MG TOTAL) BY MOUTH TWICE A DAY BEFORE MEALS, Disp: 180 tablet, Rfl: 1   pravastatin (PRAVACHOL) 80 MG tablet, Take 80 mg by mouth daily as needed., Disp: , Rfl:    sildenafil (VIAGRA) 100 MG tablet, 1/2-1 tab po prn, Disp: 30 tablet, Rfl: prn  Exam: Current vital signs: BP 125/79 (BP Location: Right Arm)   Pulse 61   Temp 97.9 F (36.6 C)   Resp 16   Ht 6\' 2"  (1.88 m)   Wt 115.7 kg   SpO2 97%   BMI 32.74 kg/m  Vital signs in last 24 hours: Temp:  [97.9 F (36.6 C)] 97.9 F (36.6 C) (06/10 1027) Pulse Rate:  [61-79] 61 (06/10 1331) Resp:  [16-18] 16 (06/10 1331) BP: (108-125)/(63-81) 125/79 (06/10 1331) SpO2:  [97 %-99 %] 97 % (06/10 1331) Weight:  [115.7 kg] 115.7 kg (06/10 1135)  GENERAL: Awake, alert, in no acute distress Psych: Affect appropriate for situation, patient is calm and cooperative with examination Head: Normocephalic and atraumatic, without obvious abnormality EENT: Normal conjunctivae, moist mucous membranes, no OP  obstruction LUNGS: Normal respiratory effort. Non-labored breathing on room air CV: Regular rate and rhythm on telemetry ABDOMEN: Soft, non-tender, non-distended Extremities: warm, well perfused, without obvious deformity  NEURO:  Mental Status: Awake, alert, and oriented to person, place, time, and situation. He is able to provide a clear and coherent history of present illness. Speech/Language: speech is clear and fluent.   No neglect is noted Cranial Nerves:  II: PERRL_ III, IV, VI: EOMI. Lid elevation symmetric and full.  V: Sensation is intact to light touch and symmetrical to face. VII: Face is symmetric resting and smiling.  VIII: Hearing intact to voice IX, X: Phonation normal.  XI: Normal sternocleidomastoid and trapezius muscle strength XII: Tongue protrudes midline without fasciculations.   Motor: Good antigravity strength in all 4 extremities Tone is normal. Bulk is normal.  Sensation: Intact to light touch bilaterally in all four extremities.  Coordination: FTN intact bilaterally. No pronator drift.  Gait: Deferred  Labs I have reviewed labs in epic and the results pertinent to this consultation are:   CBC    Component Value Date/Time   WBC 7.8 11/28/2022 1143   RBC 5.30 11/28/2022 1143   HGB 15.9 11/28/2022 1143   HCT 47.8 11/28/2022 1143   PLT 260 11/28/2022 1143   MCV 90.2 11/28/2022 1143   MCH 30.0 11/28/2022 1143   MCHC 33.3 11/28/2022 1143   RDW 11.9 11/28/2022 1143   LYMPHSABS 1.1 12/24/2020 1014   MONOABS 0.3 12/24/2020 1014   EOSABS 0.0 12/24/2020 1014   BASOSABS 0.0 12/24/2020 1014    CMP     Component Value Date/Time   NA 138 11/28/2022 1143   K 3.9 11/28/2022 1143   CL 103 11/28/2022 1143   CO2 23 11/28/2022 1143   GLUCOSE 94 11/28/2022 1143   BUN 18 11/28/2022 1143   CREATININE 1.17 11/28/2022 1143   CALCIUM 9.1 11/28/2022 1143   GFRNONAA >60 11/28/2022 1143    Lipid Panel     Component Value Date/Time   CHOL 170 12/24/2020  1014   TRIG 128 12/24/2020 1014   HDL 42 12/24/2020 1014   CHOLHDL 4.0 12/24/2020 1014   VLDL 26 12/24/2020 1014   LDLCALC 102 (H) 12/24/2020 1014     Imaging I have reviewed the images obtained:  MRI examination of the brain with and without contrast: Pending  Assessment: 57 year old patient with history of asthma, CAD, GERD, hypertension and sleep apnea presents with an episode of altered mental status and amnesia which occurred earlier today while he was at work.  Patient states that he first developed a headache, then his vision began to get foggy and he developed tunnel vision, then he had no memory of events  for several hours.  During the episode, he was unable to remember, information such as his name and the names of family members.  He has been seen outpatient by Dr. Karel Jarvis for these episodes, and recently had started on Briviact.  He had no episodes for about 2 weeks after starting the Briviact but felt severe fatigue while taking this medication.  Patient will need EEG and MRI brain with and without contrast.  As Briviact appears to be helpful, will increase it to 75 mg twice daily in the hopes that the increased dose will provide control of episodes without increasing fatigue.  Impression: Possible seizure activity versus transient generalized amnesia  Recommendations: -MRI brain with and without contrast -Routine EEG -Increase Briviact to 75 mg twice daily  Pt seen by NP/Neuro and later by MD. Note/plan to be edited by MD as needed.  Geroldine Esquivias E Ernestina Columbia , MSN, AGACNP-BC [facility] See Amion for schedule and pager information 11/28/2022 1:52 PM  ATTENDING ATTESTATION:  This patient history of hypertension sleep apnea episode of amnesia concerning for seizure.  Being followed by neurology outpatient.    It appears that he may have taken Briviact incorrectly and started a high-dose of 100 mg consider titrating up slowly.  He had intolerance to it and went down  to 50 mg.  Routine EEG was abnormal with left occipital sharply contoured waves and thus LTM was obtained will increase Briviact 75 mg and then to 100 mg.  If LTM is unremarkable will DC and have him follow-up with outpatient neurologist.  Dr. Viviann Spare evaluated pt independently, reviewed imaging, chart, labs. Discussed and formulated plan with the Resident/APP. Changes were made to the note where appropriate. Please see APP/resident note above for details.    MDM: Moderate Pertinent labs, imaging results reviewed by me and considered in my decision making. Independently reviewed imaging. Medical records reviewed. Discussed the patient with another medical provider/personnel. Obtained history from someone other than the patient.     Gaurang Palikh,MD

## 2022-11-28 NOTE — Telephone Encounter (Signed)
Pls check with wife if he is having any side effects on Briviact and she felt it was helping when we first started it. If helping, increase to 100mg  BID. Thanks

## 2022-11-28 NOTE — ED Triage Notes (Signed)
Pt had been having seizures, was seen at [facility]. Pt placed on Briviac by Dr Mina Marble (neurology). Pt has a seizure this morning around 0830. Pt is due to have a MRI but has not been scheduled. Pt is unable to answer questions (postictal), all information from wife.

## 2022-11-29 ENCOUNTER — Telehealth: Payer: Self-pay | Admitting: Neurology

## 2022-11-29 DIAGNOSIS — K219 Gastro-esophageal reflux disease without esophagitis: Secondary | ICD-10-CM | POA: Diagnosis present

## 2022-11-29 DIAGNOSIS — Z1152 Encounter for screening for COVID-19: Secondary | ICD-10-CM | POA: Diagnosis not present

## 2022-11-29 DIAGNOSIS — E785 Hyperlipidemia, unspecified: Secondary | ICD-10-CM | POA: Diagnosis present

## 2022-11-29 DIAGNOSIS — J4520 Mild intermittent asthma, uncomplicated: Secondary | ICD-10-CM | POA: Diagnosis present

## 2022-11-29 DIAGNOSIS — G4733 Obstructive sleep apnea (adult) (pediatric): Secondary | ICD-10-CM | POA: Diagnosis present

## 2022-11-29 DIAGNOSIS — I10 Essential (primary) hypertension: Secondary | ICD-10-CM | POA: Diagnosis present

## 2022-11-29 DIAGNOSIS — F4481 Dissociative identity disorder: Secondary | ICD-10-CM | POA: Diagnosis present

## 2022-11-29 DIAGNOSIS — Z7982 Long term (current) use of aspirin: Secondary | ICD-10-CM | POA: Diagnosis not present

## 2022-11-29 DIAGNOSIS — Z62810 Personal history of physical and sexual abuse in childhood: Secondary | ICD-10-CM | POA: Diagnosis not present

## 2022-11-29 DIAGNOSIS — Z79899 Other long term (current) drug therapy: Secondary | ICD-10-CM | POA: Diagnosis not present

## 2022-11-29 DIAGNOSIS — Z888 Allergy status to other drugs, medicaments and biological substances status: Secondary | ICD-10-CM | POA: Diagnosis not present

## 2022-11-29 DIAGNOSIS — F419 Anxiety disorder, unspecified: Secondary | ICD-10-CM | POA: Diagnosis present

## 2022-11-29 DIAGNOSIS — R569 Unspecified convulsions: Secondary | ICD-10-CM | POA: Diagnosis present

## 2022-11-29 DIAGNOSIS — G43109 Migraine with aura, not intractable, without status migrainosus: Secondary | ICD-10-CM | POA: Diagnosis present

## 2022-11-29 DIAGNOSIS — F4310 Post-traumatic stress disorder, unspecified: Secondary | ICD-10-CM | POA: Diagnosis present

## 2022-11-29 DIAGNOSIS — G4730 Sleep apnea, unspecified: Secondary | ICD-10-CM | POA: Diagnosis present

## 2022-11-29 DIAGNOSIS — I2510 Atherosclerotic heart disease of native coronary artery without angina pectoris: Secondary | ICD-10-CM | POA: Diagnosis present

## 2022-11-29 DIAGNOSIS — Z951 Presence of aortocoronary bypass graft: Secondary | ICD-10-CM | POA: Diagnosis not present

## 2022-11-29 DIAGNOSIS — E669 Obesity, unspecified: Secondary | ICD-10-CM | POA: Diagnosis present

## 2022-11-29 DIAGNOSIS — Z955 Presence of coronary angioplasty implant and graft: Secondary | ICD-10-CM | POA: Diagnosis not present

## 2022-11-29 DIAGNOSIS — Z6832 Body mass index (BMI) 32.0-32.9, adult: Secondary | ICD-10-CM | POA: Diagnosis not present

## 2022-11-29 LAB — CBC
HCT: 43.8 % (ref 39.0–52.0)
Hemoglobin: 15.1 g/dL (ref 13.0–17.0)
MCH: 30.1 pg (ref 26.0–34.0)
MCHC: 34.5 g/dL (ref 30.0–36.0)
MCV: 87.4 fL (ref 80.0–100.0)
Platelets: 210 10*3/uL (ref 150–400)
RBC: 5.01 MIL/uL (ref 4.22–5.81)
RDW: 11.9 % (ref 11.5–15.5)
WBC: 6.4 10*3/uL (ref 4.0–10.5)
nRBC: 0 % (ref 0.0–0.2)

## 2022-11-29 LAB — BASIC METABOLIC PANEL
Anion gap: 11 (ref 5–15)
BUN: 15 mg/dL (ref 6–20)
CO2: 24 mmol/L (ref 22–32)
Calcium: 8.4 mg/dL — ABNORMAL LOW (ref 8.9–10.3)
Chloride: 104 mmol/L (ref 98–111)
Creatinine, Ser: 1.13 mg/dL (ref 0.61–1.24)
GFR, Estimated: >60 mL/min (ref >60)
Glucose, Bld: 99 mg/dL (ref 70–99)
Potassium: 3.9 mmol/L (ref 3.5–5.1)
Sodium: 139 mmol/L (ref 135–145)

## 2022-11-29 MED ORDER — DIPHENHYDRAMINE HCL 50 MG/ML IJ SOLN
25.0000 mg | Freq: Once | INTRAMUSCULAR | Status: AC
  Administered 2022-11-29: 25 mg via INTRAVENOUS
  Filled 2022-11-29: qty 1

## 2022-11-29 MED ORDER — KETOROLAC TROMETHAMINE 15 MG/ML IJ SOLN
15.0000 mg | Freq: Once | INTRAMUSCULAR | Status: AC
  Administered 2022-11-29: 15 mg via INTRAVENOUS
  Filled 2022-11-29: qty 1

## 2022-11-29 MED ORDER — METOCLOPRAMIDE HCL 5 MG/ML IJ SOLN
10.0000 mg | Freq: Once | INTRAMUSCULAR | Status: AC
  Administered 2022-11-29: 10 mg via INTRAVENOUS
  Filled 2022-11-29: qty 2

## 2022-11-29 NOTE — Plan of Care (Signed)
  Problem: Education: Goal: Expressions of having a comfortable level of knowledge regarding the disease process will increase Outcome: Progressing   Problem: Coping: Goal: Ability to adjust to condition or change in health will improve Outcome: Progressing Goal: Ability to identify appropriate support needs will improve Outcome: Progressing   Problem: Health Behavior/Discharge Planning: Goal: Compliance with prescribed medication regimen will improve Outcome: Progressing   Problem: Medication: Goal: Risk for medication side effects will decrease Outcome: Progressing   Problem: Clinical Measurements: Goal: Complications related to the disease process, condition or treatment will be avoided or minimized Outcome: Progressing Goal: Diagnostic test results will improve Outcome: Progressing   Problem: Safety: Goal: Verbalization of understanding the information provided will improve 11/29/2022 0414 by Gerlean Ren, RN Outcome: Progressing 11/29/2022 0412 by Gerlean Ren, RN Note: Patient was instructed to call for assistance when getting OOB to the bathroom   Problem: Self-Concept: Goal: Level of anxiety will decrease Outcome: Progressing Goal: Ability to verbalize feelings about condition will improve Outcome: Progressing   Problem: Education: Goal: Knowledge of General Education information will improve Description: Including pain rating scale, medication(s)/side effects and non-pharmacologic comfort measures Outcome: Progressing   Problem: Health Behavior/Discharge Planning: Goal: Ability to manage health-related needs will improve Outcome: Progressing   Problem: Clinical Measurements: Goal: Ability to maintain clinical measurements within normal limits will improve Outcome: Progressing Goal: Will remain free from infection Outcome: Progressing Goal: Diagnostic test results will improve Outcome: Progressing Goal: Respiratory complications will  improve Outcome: Progressing Goal: Cardiovascular complication will be avoided Outcome: Progressing   Problem: Activity: Goal: Risk for activity intolerance will decrease Outcome: Progressing   Problem: Nutrition: Goal: Adequate nutrition will be maintained Outcome: Progressing   Problem: Coping: Goal: Level of anxiety will decrease Outcome: Progressing   Problem: Elimination: Goal: Will not experience complications related to bowel motility Outcome: Progressing Goal: Will not experience complications related to urinary retention Outcome: Progressing   Problem: Pain Managment: Goal: General experience of comfort will improve Outcome: Progressing   Problem: Safety: Goal: Ability to remain free from injury will improve Outcome: Progressing   Problem: Skin Integrity: Goal: Risk for impaired skin integrity will decrease 11/29/2022 0414 by Gerlean Ren, RN Outcome: Progressing 11/29/2022 0412 by Gerlean Ren, RN Note: Patient's skin was kept CD&I at all times to prevent skin breakdown.

## 2022-11-29 NOTE — Progress Notes (Signed)
EEG activations performed.

## 2022-11-29 NOTE — Progress Notes (Signed)
   11/29/22 1500  Spiritual Encounters  Type of Visit Initial  Care provided to: Patient;Family  Referral source Patient request  Reason for visit Advance directives  OnCall Visit No   Ch responded to request for AD. Pt's wife was at bedside. Ch assisted pt with AD education. Pt page [facility] when AD is ready. No follow-up needed at this time.

## 2022-11-29 NOTE — Progress Notes (Signed)
PROGRESS NOTE    Chase Edwards  WUJ:[id number] DOB: Jun 06, 1966 DOA: 11/28/2022 PCP: [facility]    Chief Complaint  Patient presents with   Seizures    Brief Narrative:  Patient 57 year old gentleman history of CAD status post CABG, hyperlipidemia, GERD, LPR, hypertension, anxiety, asthma, OSA presented with concerns for seizures.  Patient noted to have had multiple spells of staring with confusion with a prolonged episode lasting approximately 4 hours in the ED followed with a headache.  No tonic-clonic activity.  MRI head done negative for any acute abnormalities.  Neurology consulted Spot EEG ordered showed an abnormality and patient placed on overnight EEG.  Patient's AED dose was increased per neurology recommendations.    Assessment & Plan:   Principal Problem:   Seizures (HCC) Active Problems:   CAD (coronary artery disease)   HLD (hyperlipidemia)   GERD (gastroesophageal reflux disease)   Laryngopharyngeal reflux (LPR)   Anxiety   Essential (primary) hypertension   Hx of CABG   Mild intermittent asthma without complication   Obstructive sleep apnea (adult) (pediatric)  #1 seizures -Patient noted to be followed up in the outpatient setting for ongoing workup for seizures. -Seizure episodes noted as staring spells with decreased responsiveness, confusion followed by headache. -Send noted to have been started on Briviact in the outpatient setting but noted to have prolonged episodes of around 4 hours the morning of admission despite compliance with medications. -Patient seen in consultation by neurology in the ED Spot EEG done noted to be abnormal and patient placed on overnight EEG and MRI recommended. -MRI done with no acute abnormalities. -Patient with a prior history of PTSD as noted we are admitting physician. -Patient also noted to have a diagnosis of disassociative identity disorder which he had stated to admitting physician was a defense mechanism years  he believes he developed of this alternative personality which can be verbally abusive and comes out when he is backed into a corner, no history of physical abuse from this personality.  It is noted that typically wife able to calm him down as she is the only person this personality recognizes. -Briviact dose increased to 75 mg twice daily per neurology. -Patient noted to have an episode of seizure this morning per wife while on EEG. -Neurology following and this morning are recommending discontinuation of Briviact at this time and monitoring on LTM EEG for 1 more day. -Per neurology.  2.  CAD status post CABG -Continue aspirin, losartan, Toprol-XL, Zetia, pravastatin.  3.  Hyperlipidemia -Proper statin.  4.  Hypertension -BP controlled on current regimen of Cozaar, Toprol-XL.  5.  GERD. -PPI.  6.  Asthma -Continue Breztri and albuterol as needed.  7.  OSA -CPAP nightly.    DVT prophylaxis: SCDs Code Status: Full Family Communication: Updated patient and wife at bedside. Disposition: Home when clinically improved and cleared by neurology.  Status is: Inpatient The patient will require care spanning > 2 midnights and should be moved to inpatient because: Severity of illness   Consultants:  Neurology: Dr.Palikh 11/28/2022  Procedures:  EEG MRI brain 11/28/2022   Antimicrobials:  Anti-infectives (From admission, onward)    None         Subjective: Laying in bed with EEG on.  Wife at bedside.  Per wife patient had an another episode of staring spells/seizures this morning that neurology nurse practitioner noted on EEG monitor.  No chest pain.  No shortness of breath.  No abdominal pain.  Objective: Vitals:   11/29/22  0800 11/29/22 1100 11/29/22 1555 11/29/22 1919  BP: 134/85 123/81 119/80   Pulse: 65 65 75   Resp: 18 16 16    Temp: 98.3 F (36.8 C) (!) 97.5 F (36.4 C) 98.1 F (36.7 C)   TempSrc: Oral Oral Oral   SpO2: 97% 98% 100% 98%  Weight:      Height:         Intake/Output Summary (Last 24 hours) at 11/29/2022 1956 Last data filed at 11/29/2022 1800 Gross per 24 hour  Intake 600 ml  Output --  Net 600 ml   Filed Weights   11/28/22 1135  Weight: 115.7 kg    Examination:  General exam: Appears calm and comfortable.  EEG electrodes on. Respiratory system: Clear to auscultation.  No wheezes, no crackles, no rhonchi.  Fair air movement.  Speaking in full sentences.  Respiratory effort normal. Cardiovascular system: S1 & S2 heard, RRR. No JVD, murmurs, rubs, gallops or clicks. No pedal edema. Gastrointestinal system: Abdomen is nondistended, soft and nontender. No organomegaly or masses felt. Normal bowel sounds heard. Central nervous system: Alert and oriented. No focal neurological deficits. Extremities: Symmetric 5 x 5 power. Skin: No rashes, lesions or ulcers Psychiatry: Judgement and insight appear normal. Mood & affect appropriate.     Data Reviewed: I have personally reviewed following labs and imaging studies  CBC: Recent Labs  Lab 11/28/22 1143 11/29/22 0444  WBC 7.8 6.4  HGB 15.9 15.1  HCT 47.8 43.8  MCV 90.2 87.4  PLT 260 210    Basic Metabolic Panel: Recent Labs  Lab 11/28/22 1143 11/28/22 2048 11/29/22 0444  NA 138  --  139  K 3.9  --  3.9  CL 103  --  104  CO2 23  --  24  GLUCOSE 94  --  99  BUN 18  --  15  CREATININE 1.17  --  1.13  CALCIUM 9.1  --  8.4*  MG  --  1.9  --     GFR: Estimated Creatinine Clearance: 99.9 mL/min (by C-G formula based on SCr of 1.13 mg/dL).  Liver Function Tests: No results for input(s): "AST", "ALT", "ALKPHOS", "BILITOT", "PROT", "ALBUMIN" in the last 168 hours.  CBG: No results for input(s): "GLUCAP" in the last 168 hours.   No results found for this or any previous visit (from the past 240 hour(s)).       Radiology Studies: MR BRAIN W WO CONTRAST  Result Date: 11/28/2022 CLINICAL DATA:  New onset seizure EXAM: MRI HEAD WITHOUT AND WITH CONTRAST  TECHNIQUE: Multiplanar, multiecho pulse sequences of the brain and surrounding structures were obtained without and with intravenous contrast. CONTRAST:  10mL GADAVIST GADOBUTROL 1 MMOL/ML IV SOLN COMPARISON:  CT head 11/09/2022, MR head 11/08/2022. FINDINGS: Brain: There is no acute intracranial hemorrhage, extra-axial fluid collection, or acute infarct. Parenchymal volume is normal. The ventricles are normal in size. Parenchymal signal is normal. There is no cortical encephalomalacia. The corpus callosum is normally formed. The hippocampi are normal in signal and architecture. There is no structural or migration abnormality. There is no mass lesion or abnormal enhancement. There is no mass effect or midline shift. Vascular: Normal flow voids. Skull and upper cervical spine: Normal marrow signal. Sinuses/Orbits: Partial opacification of the left sphenoid sinus is unchanged. The globes and orbits are unremarkable. Other: None. IMPRESSION: Normal appearance of the brain with no acute intracranial pathology or epileptogenic focus identified. Electronically Signed   By: Lesia Hausen M.D.   On:  11/28/2022 19:43        Scheduled Meds:  aspirin  81 mg Oral Daily   ezetimibe  10 mg Oral Daily   losartan  50 mg Oral Daily   metoprolol succinate  50 mg Oral Daily   mometasone-formoterol  2 puff Inhalation BID   And   umeclidinium bromide  1 puff Inhalation Daily   pantoprazole  40 mg Oral BID AC   sodium chloride flush  3 mL Intravenous Q12H   Continuous Infusions:   LOS: 0 days    Time spent: 40 minutes    Ramiro Harvest, MD [facility]   To contact the attending provider between 7A-7P or the covering provider during after hours 7P-7A, please log into the web site [email or web address] and access using universal [place] password for that web site. If you do not have the password, please call the hospital operator.  11/29/2022, 7:56 PM

## 2022-11-29 NOTE — Progress Notes (Signed)
I was called regarding some agitation, complaints of head pain, and concern for seizure.  I reviewed the EEG, I do not see any obvious electrographic change with his event.  In reviewing notes from earlier today, there is some concern for possible complicated migraine and this would be consistent with his current complaint of headache.  I will order Reglan 10 milligrams x 1, Toradol 15 mg x 1, Benadryl 25 mg x 1 for migraine treatment and continue to monitor.   Ritta Slot, MD [facility] [phone]  If 7pm- 7am, please page neurology on call as listed in AMION.

## 2022-11-29 NOTE — Progress Notes (Signed)
Patient ID: Sipriano Fendley, male   DOB: 12-Aug-1965, 57 y.o.   MRN: [id number]  Spoke with pt's wife.  She was a little confused as to what is going on.  I updated her that we have been in contact with her outpatient neurologist.  She recommended we stop the bradycardia and monitor for another 24 hours.  I discussed with her that the EEG so far has been negative including the spell he had this morning.  He was confused during the spell and she said he would not react anybody but her.  I reassured her again that the EEG did not show a seizure.  She is in agreement with the plan to stop seizure medication and continue to monitor in order to capture another spell.  This will help her outpatient neurologist manage his condition better.  I did tell her that he will need further outpatient workup if EEG is negative overnight.  She can follow-up with his neurologist outpatient after discharge.

## 2022-11-29 NOTE — Telephone Encounter (Signed)
Spoke to patient and wife. They have been confused because they were told that the initial EEG "had shown abnormalities on the left side." I reviewed EEG on initial hookup and did not see any changes, will confirm with Dr. Melynda Ripple. I discussed that I also reviewed the event from this morning 8am when he was unable to speak, EEG was normal. Discussed the diagnosis of non-epileptic events with patient and wife. Dr. Melynda Ripple also suggested the possibility of migraine-related symptoms since he has a headache consistently after these episodes. They asked why he did not have any events on Briviact 100mg  BID, but then had the symptoms when dose reduced to 50mg . Discussed the possibility of placebo effect, and that this is the reason we are monitoring them overnight off Briviact to assess for any abnormalities on EEG or capture more episodes.   Discussed that if EEG continues to be normal, would plan for treatment with either Depakote or Zonisamide for possible migraine variant. Also discussed non-epileptic events and how CBT can be helpful. He is scheduled to start CBT on Monday.

## 2022-11-29 NOTE — Progress Notes (Signed)
   11/29/22 0012  BiPAP/CPAP/SIPAP  Reason BIPAP/CPAP not in use Non-compliant

## 2022-11-29 NOTE — Telephone Encounter (Signed)
Spoke with pt wife she stated that they did an EEG and that he wasn't having seizures. They are asking if you can see it? They want to take him off the Briviact. Pt is scared to come off the medication because the 100mg  BID was helping and when it dropped to 50 mg he had an episode. He is worried that if he is working an has an episode  that he might walk or fall of the roof. Pt wife said that the hospital told them that they have been talking to you.

## 2022-11-29 NOTE — TOC Initial Note (Signed)
Transition of Care [facility]) - Initial/Assessment Note    Patient Details  Name: Chase Edwards MRN: [id number] Date of Birth: [birth year]  [facility] CM/SW Contact:    Kermit Balo, RN Phone Number: 11/29/2022, 10:54 AM  Clinical Narrative:                 Pt is from home with his spouse and mother in law. He states someone is with him most of the time.  No DME at home. Spouse provides needed transportation. Spouse oversees his medications at home. He denies any issues.  [facility] [address]. Pt asking about completing Advanced directives and POA. CM has provided him the packet and put in order to Spiritual Care.  ToC following.  Expected Discharge Plan: Home/Self Care Barriers to Discharge: Continued Medical Work up   Patient Goals and CMS Choice            Expected Discharge Plan and Services   Discharge Planning Services: CM Consult   Living arrangements for the past 2 months: Single Family Home                                      Prior Living Arrangements/Services Living arrangements for the past 2 months: Single Family Home Lives with:: Spouse Patient language and need for interpreter reviewed:: Yes Do you feel safe going back to the place where you live?: Yes        Care giver support system in place?: Yes (comment)   Criminal Activity/Legal Involvement Pertinent to Current Situation/Hospitalization: No - Comment as needed  Activities of Daily Living Home Assistive Devices/Equipment: Blood pressure cuff, CPAP ADL Screening (condition at time of admission) Patient's cognitive ability adequate to safely complete daily activities?: Yes Is the patient deaf or have difficulty hearing?: Yes Does the patient have difficulty seeing, even when wearing glasses/contacts?: No Does the patient have difficulty concentrating, remembering, or making decisions?: No Patient able to express need for assistance with ADLs?: Yes Does the patient have  difficulty dressing or bathing?: No Independently performs ADLs?: Yes (appropriate for developmental age) Does the patient have difficulty walking or climbing stairs?: No Weakness of Legs: None Weakness of Arms/Hands: None  Permission Sought/Granted                  Emotional Assessment Appearance:: Appears stated age Attitude/Demeanor/Rapport: Engaged Affect (typically observed): Accepting Orientation: : Oriented to Self, Oriented to Place, Oriented to  Time, Oriented to Situation   Psych Involvement: No (comment)  Admission diagnosis:  Seizures (HCC) [R56.9] Altered mental status, unspecified altered mental status type [R41.82] Patient Active Problem List   Diagnosis Date Noted   Seizures (HCC) 11/28/2022   GERD (gastroesophageal reflux disease) 09/06/2022   Laryngopharyngeal reflux (LPR) 09/06/2022   Anxiety 12/06/2021   Hx of CABG 12/06/2021   Mild intermittent asthma without complication 02/16/2021   CAD (coronary artery disease) 12/25/2020   HLD (hyperlipidemia) 12/25/2020   Unstable angina (HCC) 12/24/2020   Obstructive sleep apnea (adult) (pediatric) 08/19/2020   Essential (primary) hypertension 07/27/2020   PCP:  [facility] Pharmacy:   [id number] [place] [place] - [address] AT [facility] [address] [place] [address] Phone: [phone] Fax: [phone]     Social Determinants of Health (SDOH) Social History: SDOH Screenings   Food Insecurity: No Food Insecurity (11/28/2022)  Housing: Low Risk  (11/28/2022)  Transportation Needs: No Transportation Needs (11/28/2022)  Utilities: Not At Risk (11/28/2022)  Tobacco Use: Low Risk  (11/28/2022)   SDOH Interventions:     Readmission Risk Interventions     No data to display

## 2022-11-29 NOTE — Telephone Encounter (Signed)
Pt's wife called in and left a message. She is wondering if anyone has been able to speak with DR. Karel Jarvis about her husband yet

## 2022-11-29 NOTE — Telephone Encounter (Signed)
Patients wife would like to speak with dr.aquino. Uriyah is in the hospital and she has some questions about medicine and what is going on

## 2022-11-29 NOTE — Procedures (Signed)
Patient Name: Levester Waldridge  MRN: [id number]  Epilepsy Attending: Charlsie Quest  Referring Physician/Provider: Marjorie Smolder, NP  Duration: 11/28/2022 1313 to 11/29/2022 1313  Patient history:  57 y.o. male with medical history significant of CAD status post CABG, hyperlipidemia, GERD, LPR, hypertension, anxiety, asthma, OSA presenting with concern for seizures. EEG to evaluate for seizure  Level of alertness: Awake, asleep  AEDs during EEG study: BRV  Technical aspects: This EEG study was done with scalp electrodes positioned according to the 10-20 International system of electrode placement. Electrical activity was reviewed with band pass filter of 1-70Hz , sensitivity of 7 uV/mm, display speed of 70mm/sec with a 60Hz  notched filter applied as appropriate. EEG data were recorded continuously and digitally stored.  Video monitoring was available and reviewed as appropriate.  Description: The posterior dominant rhythm consists of 10 Hz activity of moderate voltage (25-35 uV) seen predominantly in posterior head regions, symmetric and reactive to eye opening and eye closing. Sleep was characterized by vertex waves, sleep spindles (12 to 14 Hz), maximal frontocentral region, posterior occipital sharp transients of sleep ( POSTs). Hyperventilation and photic stimulation were not performed.     Patient was noted to have aphasia and headache at around 0800 on 11/29/2022. Concomitant eeg before, during and after the event didn't show any EEG change to suggest seizure.   IMPRESSION: This study is within normal limits. No seizures or epileptiform discharges were seen throughout the recording.  Patient was noted to have aphasia and headache at around 0800 on 11/29/2022. Without concomitant eeg change. This was not an epileptic event.   A normal interictal EEG does not exclude the diagnosis of epilepsy.  Lanaiya Lantry Annabelle Harman

## 2022-11-29 NOTE — Progress Notes (Signed)
LTM maint complete - no skin breakdown under: FP2,F4,F8  

## 2022-11-29 NOTE — Telephone Encounter (Signed)
See other phone note

## 2022-11-29 NOTE — Progress Notes (Signed)
Neurology Progress Note  Brief HPI: 57 y.o. male with history of asthma, CAD, GERD, hypertension and sleep apnea who presents after having an episode at work today where he first as though he was unwell, developed a headache, then his vision became foggy and he developed tunnel vision.  He had no memory of the events for several hours after this.  During these episodes, patient is often unable to remember his name, the names of family members and other information.  He has had multiple similar episodes recently, which have often occurred at work.  He was seen by Dr. Karel Jarvis and was started on Briviact.  Since he started the Briviact, he has only had the episode that happened today and no others, but it is causing him to feel quite fatigued.   Subjective: Patient seen in room with wife at the bedside. Initially he is talking appropriately. Part way through our conversation he states "it's starting" and is then unable to speak further. He becomes frustrated and emotional. Still able to follow commands and participate in exam but he is unable to speak except for one instance where he states "no". He is able to nod and shake his head appropriately and able to express that his headache is a 6/10. His wife states that he rarely gets headaches and he does not have a history of migraines.   Exam: Vitals:   11/28/22 1959 11/28/22 2310  BP: (!) 130/92 114/65  Pulse: 78 62  Resp: 18   Temp: 97.6 F (36.4 C) 97.8 F (36.6 C)  SpO2: 96% 99%   Gen: In bed, NAD Resp: non-labored breathing, no acute distress Abd: soft, nt  Neuro: Mental Status: AAox4. Speech is clear. Patient is able to give a clear and coherent history. No signs of aphasia or neglect (During episode he appears to have some expressive aphasia, he is able to follow commands, cannot speak but is able to shake/nod head appropriately) Cranial Nerves: II: Visual Fields are full. PERRL.   III,IV, VI: EOMI without ptosis or diploplia.  V:  Facial sensation is symmetric to temperature VII: Facial movement is symmetric resting and smiling VIII: Hearing is intact to voice X: Palate elevates symmetrically XI: Shoulder shrug is symmetric. XII: Tongue protrudes midline without atrophy or fasciculations.  Motor: Tone is normal. Bulk is normal. 5/5 strength was present in all four extremities.  Sensory: Sensation is symmetric to light touch and temperature in the arms and legs. No extinction to DSS present.  Cerebellar: FNF and HKS are intact bilaterally, rapid alternating movements Gait: steady or deferred for safety    Pertinent Labs: CBC and Bmet WNL  Imaging Reviewed: MRI Brain - Normal appearance of the brain with no acute intracranial pathology or epileptogenic focus identified.  EEG - pending   Assessment: 57 year old patient with history of asthma, CAD, GERD, hypertension and sleep apnea presents with an episode of altered mental status and amnesia which occurred earlier today while he was at work.  Patient states that he first developed a headache, then his vision began to get foggy and he developed tunnel vision, then he had no memory of events for several hours.  During the episode, he was unable to remember, information such as his name and the names of family members.  He has been seen outpatient by Dr. Karel Jarvis for these episodes, and recently had started on Briviact.  He had no episodes for about 2 weeks after starting the Briviact but felt severe fatigue while taking this medication.  Discussed with Dr. Karel Jarvis- plan to stop Briviact and continue LTM for another day for spell capture.   Impression: Possible seizure activity versus transient generalized amnesia  Medication reaction   Recommendations: - Follow up with Dr. Karel Jarvis outpatient - Stop Briviact at 56mg  BID  - Continue LTM for one more day  Patient seen and examined by NP/APP with MD. MD to update note as needed.   Elmer Picker, DNP, FNP-BC [facility] Pager: [phone]  ATTENDING ATTESTATION:  1 event this morning that was negative on LTM.  Initial plan was to discharge home.  After discussion with Dr. Karel Jarvis, we decided to keep him for another day off medication to capture more spells to help guide further outpatient therapy and workup.  Tentative plans to discharge tomorrow if EEG is negative.  The patient and his wife were updated.  Wife is concerned there is something else going on and has been asking for answers for quite some time.  Dr. Viviann Spare evaluated pt independently, reviewed imaging, chart, labs. Discussed and formulated plan with the Resident/APP. Changes were made to the note where appropriate. Please see APP/resident note above for details.     Taiana Temkin,MD

## 2022-11-30 ENCOUNTER — Other Ambulatory Visit (HOSPITAL_COMMUNITY): Payer: Self-pay

## 2022-11-30 ENCOUNTER — Telehealth (INDEPENDENT_AMBULATORY_CARE_PROVIDER_SITE_OTHER): Payer: Self-pay | Admitting: *Deleted

## 2022-11-30 ENCOUNTER — Telehealth: Payer: Self-pay | Admitting: Neurology

## 2022-11-30 DIAGNOSIS — R569 Unspecified convulsions: Secondary | ICD-10-CM | POA: Diagnosis not present

## 2022-11-30 LAB — BASIC METABOLIC PANEL
Anion gap: 8 (ref 5–15)
BUN: 12 mg/dL (ref 6–20)
CO2: 24 mmol/L (ref 22–32)
Calcium: 8.3 mg/dL — ABNORMAL LOW (ref 8.9–10.3)
Chloride: 105 mmol/L (ref 98–111)
Creatinine, Ser: 1.28 mg/dL — ABNORMAL HIGH (ref 0.61–1.24)
GFR, Estimated: >60 mL/min (ref >60)
Glucose, Bld: 110 mg/dL — ABNORMAL HIGH (ref 70–99)
Potassium: 4 mmol/L (ref 3.5–5.1)
Sodium: 137 mmol/L (ref 135–145)

## 2022-11-30 LAB — MAGNESIUM: Magnesium: 2.1 mg/dL (ref 1.7–2.4)

## 2022-11-30 MED ORDER — SUMATRIPTAN SUCCINATE 25 MG PO TABS: 25.0000 mg | ORAL_TABLET | Freq: Two times a day (BID) | ORAL | Status: DC | PRN

## 2022-11-30 MED ORDER — SUMATRIPTAN SUCCINATE 25 MG PO TABS
25.0000 mg | ORAL_TABLET | Freq: Two times a day (BID) | ORAL | 0 refills | Status: DC | PRN
  Filled 2022-11-30: qty 10, 5d supply, fill #0

## 2022-11-30 MED ORDER — ZONISAMIDE 25 MG PO CAPS
50.0000 mg | ORAL_CAPSULE | Freq: Every day | ORAL | 0 refills | Status: DC
  Filled 2022-11-30: qty 60, 30d supply, fill #0

## 2022-11-30 MED ORDER — ZONISAMIDE 25 MG PO CAPS
50.0000 mg | ORAL_CAPSULE | Freq: Every day | ORAL | Status: DC
  Filled 2022-11-30: qty 2

## 2022-11-30 NOTE — Telephone Encounter (Signed)
Thanks for the update

## 2022-11-30 NOTE — Telephone Encounter (Signed)
F/u   Returning call back to the nurse  

## 2022-11-30 NOTE — Progress Notes (Signed)
Patient's wife called to room at 2057, said he started having seizure at 2051, waited until patient's legs started shaking before she called a few minutes later. Patient was able to respond by saying his head hurt, and sounded like he was sobbing. He was fully awake around 2107 and asked what happened, and apologized. Dr. Truitt Merle and new orders received. Will continue to monitor.

## 2022-11-30 NOTE — Progress Notes (Signed)
Discharge instructions given. Patient verbalized understanding and all questions were answered.  ?

## 2022-11-30 NOTE — Progress Notes (Signed)
Patient to start home CPAP therapy upon discharge. Does not want to start therapy while admitted. He has already arranged to pick up his machine.

## 2022-11-30 NOTE — Progress Notes (Signed)
LTM maint complete - no skin breakdown under: fp1, 02.  FP2 has a area with raised bumps moved electrode

## 2022-11-30 NOTE — Telephone Encounter (Signed)
Thank you :)

## 2022-11-30 NOTE — Telephone Encounter (Signed)
Pt is being released from the hospital and they were told that what is going on with him is no big deal. It would just go away and they will just have to live with it. They would like to speak to someone in our office about this as they do not like what they were told and they were not given any medication or anything

## 2022-11-30 NOTE — Telephone Encounter (Signed)
Pls let her know that the Briviact is for epileptic seizures. His EEG was normal, there is no indication of epilepsy, so it really will not help in the long run. We will discuss starting medication for headaches to hopefully help with his symptoms on his follow-up with me. I know they are scary when they happen, but pls reassure them that these episodes are not life threatening, main thing is to reassure him and make sure he is safe during them. Thanks

## 2022-11-30 NOTE — Telephone Encounter (Signed)
Pt wife stated that they will come in Friday at 3:30pm to discuss next steps.  Pt wife asking if the hospital sends him home on NO medication should he still continue the Briviact even if the hospital says no to prevent other episodes?

## 2022-11-30 NOTE — Telephone Encounter (Signed)
Can they come for f/u on Friday at 3:30pm to discuss next steps? Thanks

## 2022-11-30 NOTE — Procedures (Addendum)
[name]  MRN: [id number]  Epilepsy Attending: Charlsie Quest  Referring Physician/Provider: Marjorie Smolder, NP  Duration: 11/29/2022 1313 to 11/30/2022 1316   Patient history:  57 y.o. male with medical history significant of CAD status post CABG, hyperlipidemia, GERD, LPR, hypertension, anxiety, asthma, OSA presenting with concern for seizures. EEG to evaluate for seizure   Level of alertness: Awake, asleep   AEDs during EEG study: None   Technical aspects: This EEG study was done with scalp electrodes positioned according to the 10-20 International system of electrode placement. Electrical activity was reviewed with band pass filter of 1-70Hz , sensitivity of 7 uV/mm, display speed of 59mm/sec with a 60Hz  notched filter applied as appropriate. EEG data were recorded continuously and digitally stored.  Video monitoring was available and reviewed as appropriate.   Description: The posterior dominant rhythm consists of 10 Hz activity of moderate voltage (25-35 uV) seen predominantly in posterior head regions, symmetric and reactive to eye opening and eye closing. Sleep was characterized by vertex waves, sleep spindles (12 to 14 Hz), maximal frontocentral region, posterior occipital sharp transients of sleep ( POSTs). Photic driving was not seen during photic stimulation. No EEG change was seen during hyperventilation.    Event button was pressed on 11/29/2022 at 2057. Patient was noted to be laying in bed with eyes closed, had bilateral lower extremity movements and didn't respond to RNs questions. Later he reported headache. Concomitant eeg before, during and after the event didn't show any EEG change to suggest seizure.    IMPRESSION: This study is within normal limits. No seizures or epileptiform discharges were seen throughout the recording.   Event button was pressed on 11/29/2022 at 2057 as described above without concomitant eeg change. This was not an epileptic event.      Samentha Perham Annabelle Harman

## 2022-11-30 NOTE — Telephone Encounter (Signed)
I got patient sch for 12-02-22 with Karel Jarvis and I left his other follow up on the sch just in case we will cancel if needed

## 2022-11-30 NOTE — Progress Notes (Addendum)
Neurology Progress Note  Brief HPI: 57 y.o. male with history of asthma, CAD, GERD, hypertension and sleep apnea who presents after having an episode at work today where he first as though he was unwell, developed a headache, then his vision became foggy and he developed tunnel vision.  He had no memory of the events for several hours after this.  During these episodes, patient is often unable to remember his name, the names of family members and other information.  He has had multiple similar episodes recently, which have often occurred at work.  He was seen by Dr. Karel Jarvis and was started on Briviact.  Since he started the Briviact, he has only had the episode that happened today and no others, but it is causing him to feel quite fatigued.   Subjective: Patient seen in room with wife at the bedside.  He verbalizes anxiety regarding his discharge and his affective mental health problems on his job.  He also endorses presence of multiple stressors, including adverse childhood experiences, recent divorce and death of family members.  He states he is getting ready to start cognitive behavioral therapy to assist with this.  Exam: Vitals:   11/30/22 0859 11/30/22 1129  BP:  128/82  Pulse:  61  Resp:  16  Temp:  98.3 F (36.8 C)  SpO2: 95% 97%   Gen: In bed, NAD Resp: non-labored breathing, no acute distress Abd: soft, nt  Neuro: Mental Status: AAox4. Speech is clear. Patient is able to give a clear and coherent history. No signs of aphasia or neglect Cranial Nerves: II:  PERRL.   III,IV, VI: EOMI without ptosis or diploplia.  VII: Facial movement is symmetric resting and smiling VIII: Hearing is intact to voice XII: Tongue protrudes midline without atrophy or fasciculations.  Motor: Tone is normal. Bulk is normal. 5/5 strength was present in all four extremities.  Sensory: Sensation is symmetric to light touch and temperature in the arms and legs.     Pertinent Labs: CBC and Bmet  WNL  Imaging Reviewed: MRI Brain - Normal appearance of the brain with no acute intracranial pathology or epileptogenic focus identified.  EEG 6/11 to 6/12: No seizures or epileptiform discharges, signal did not was not epileptic  Assessment: 57 year old patient with history of asthma, CAD, GERD, hypertension and sleep apnea presents with an episode of altered mental status and amnesia which occurred earlier today while he was at work.  Patient states that he first developed a headache, then his vision began to get foggy and he developed tunnel vision, then he had no memory of events for several hours.  During the episode, he was unable to remember, information such as his name and the names of family members.  He has been seen outpatient by Dr. Karel Jarvis for these episodes, and recently had started on Briviact.  He had no episodes for about 2 weeks after starting the Briviact but felt severe fatigue while taking this medication.  Episodes have been captured on LTM EEG and were found to be nonepileptic.  Given the patient's history of headache along with episodes, believe that they are most likely complex migraines, possibly exacerbated by response to life stressors as noted above.  Impression: Complex migraines with cognitive symptoms  Recommendations: -Zonisamide 50 mg daily -As needed Imitrex -Close follow-up with outpatient neurologist and therapist  Patient seen and examined by NP/APP with MD. MD to update note as needed.   Cortney E Ernestina Columbia , MSN, AGACNP-BC [facility] See  Amion for schedule and pager information 11/30/2022 12:46 PM  ATTENDING ATTESTATION:  Discussed in detail with the husband and wife together about EEG findings.  They seem very concerned that something else is going on but more particularly very anxious.  When he has a spell it tends to only get better when his wife is around.  He may have some component of anxiety.  Will treat with zonisamide as this may  help in several ways.  He has an appointment on Friday to follow-up with his neurologist.  Give plenty of reassurance and a very extended and thorough conversation.  All of their questions were answered to their satisfaction.   Dr. Viviann Spare evaluated pt independently, reviewed imaging, chart, labs. Discussed and formulated plan with the Resident/APP. Changes were made to the note where appropriate. Please see APP/resident note above for details.   Total 35 minutes spent on counseling patient and coordinating care, writing notes and reviewing chart.   Annalynne Ibanez,MD

## 2022-11-30 NOTE — Plan of Care (Signed)
  Problem: Education: Goal: Expressions of having a comfortable level of knowledge regarding the disease process will increase Outcome: Adequate for Discharge   

## 2022-11-30 NOTE — Telephone Encounter (Signed)
Spoke with pt wife informed her that the Briviact is for epileptic seizures. His EEG was normal, there is no indication of epilepsy, so it really will not help in the long run. We will discuss starting medication for headaches to hopefully help with his symptoms on his follow-up with me.Dr Karel Jarvis knows they are scary when they happen, but pls reassure them that these episodes are not life threatening, main thing is to reassure him and make sure he is safe during them  Hospital started him on sumatriptan as needed and zonisamide 50mg  BID

## 2022-11-30 NOTE — Discharge Summary (Signed)
Physician Discharge Summary   [name] MRN: [id number] DOB: 1966/02/08  Admit date:     11/28/2022  Discharge date: 11/30/22  Discharge Physician: Alberteen Sam   PCP: [facility]     Recommendations at discharge:  Follow up with Neurology Dr. Karel Edwards as planned for suspected complex migraine     Discharge Diagnoses: Principal Problem:   Seizures (HCC) Active Problems:   CAD (coronary artery disease)   HLD (hyperlipidemia)   GERD (gastroesophageal reflux disease)   Laryngopharyngeal reflux (LPR)   Anxiety   Essential (primary) hypertension   Hx of CABG   Mild intermittent asthma without complication   Obstructive sleep apnea (adult) (pediatric)      Hospital Course: Chase Edwards is a 57 y.o. M with obesity, HTN, CAD s/p CABG, HLD, anxiety and OSA who presented with staring spells.     Staring spells, suspect complex migraine Patient admitted and neurology were consulted.  MRI brain was unremarkable.  He was placed on continuous EEG, and during monitoring overnight no abnormal EEG signal was observed, including during a typical staring episode.    In the context of simultaneous headaches, neurology suspected that he may be having complex migraines.  Less likely conversion disorder.  He was started on zonisamide once daily.  He has close follow-up with his neurologist this Friday.  He was also provided with as needed Imitrex for headache abortion.            The [facility] Controlled Substances Registry was reviewed for this patient prior to discharge.  Consultants: Neurology Procedures performed:  MRI brain Continuous EEG  Disposition: Home Diet recommendation:  Cardiac diet  DISCHARGE MEDICATION: Allergies as of 11/30/2022       Reactions   Phenergan [promethazine]    Seizures and vomiting        Medication List     STOP taking these medications    Briviact 50 MG Tabs Generic drug: Brivaracetam        TAKE these medications    albuterol 108 (90 Base) MCG/ACT inhaler Commonly known as: VENTOLIN HFA Inhale 1 puff into the lungs every 6 (six) hours as needed for shortness of breath.   APAP-Pamabrom-Pyrilamine 500-25-15 MG Tabs Take 3 tablets by mouth at bedtime.   aspirin 81 MG chewable tablet Chew 1 tablet (81 mg total) by mouth daily.   Breztri Aerosphere 160-9-4.8 MCG/ACT Aero Generic drug: Budeson-Glycopyrrol-Formoterol Inhale 2 puffs into the lungs in the morning and at bedtime.   ezetimibe 10 MG tablet Commonly known as: ZETIA Take 10 mg by mouth daily.   famotidine 20 MG tablet Commonly known as: PEPCID Take 40 mg by mouth daily.   ipratropium-albuterol 0.5-2.5 (3) MG/3ML Soln Commonly known as: DUONEB Inhale 3 mLs into the lungs every 12 (twelve) hours as needed (shortness of breath).   loratadine 10 MG tablet Commonly known as: CLARITIN Take 20 mg by mouth daily.   losartan 50 MG tablet Commonly known as: COZAAR Take 50 mg by mouth daily.   metoprolol succinate 25 MG 24 hr tablet Commonly known as: TOPROL-XL Take 50 mg by mouth daily.   nitroGLYCERIN 0.4 MG SL tablet Commonly known as: NITROSTAT Place 0.4 mg under the tongue every 5 (five) minutes as needed for chest pain.   pantoprazole 40 MG tablet Commonly known as: PROTONIX TAKE 1 TABLET (40 MG TOTAL) BY MOUTH TWICE A DAY BEFORE MEALS What changed: See the new instructions.   pravastatin 80 MG tablet Commonly known  as: PRAVACHOL Take 80 mg by mouth daily.   sildenafil 100 MG tablet Commonly known as: VIAGRA 1/2-1 tab po prn What changed:  how much to take how to take this when to take this reasons to take this   SUMAtriptan 25 MG tablet Commonly known as: IMITREX Take 1 tablet (25 mg total) by mouth 2 (two) times daily as needed for migraine or headache. May repeat in 2 hours if headache persists or recurs.   zonisamide 25 MG capsule Commonly known as: ZONEGRAN Take 2 capsules (50  mg total) by mouth at bedtime.        Follow-up Information     Chase Clines, MD Follow up.   Specialty: Neurology Contact information: [address] [place] [address] [phone]                 Discharge Instructions     Discharge instructions   Complete by: As directed    You were admitted for staring spells, to perform testing to confirm whether they were seizures or not.  Here, your EEG was done that showed no electrical seizure activity.  A typical episode was even captured by the EEG test and was not a seizure.  This is helpful to rule out that these are very likely NOT seizures.    An alternative explanation is that they are complex migraines.  For prevention of migraines, start the medicine zonisamide/Zonegran nightly  If you have another headache soon, you can also take an aborting medicine, sumatriptan/Imitrex (which will stop a headache that has just started  Follow up with Dr. Karel Edwards this Friday for next steps   Increase activity slowly   Complete by: As directed        Discharge Exam: Filed Weights   11/28/22 1135  Weight: 115.7 kg    General: Pt is alert, awake, not in acute distress Cardiovascular: RRR, nl S1-S2, no murmurs appreciated.   No LE edema.   Respiratory: Normal respiratory rate and rhythm.  CTAB without rales or wheezes. Abdominal: Abdomen soft and non-tender.  No distension or HSM.   Neuro/Psych: Strength symmetric in upper and lower extremities.  Judgment and insight appear normal.   Condition at discharge: good  The results of significant diagnostics from this hospitalization (including imaging, microbiology, ancillary and laboratory) are listed below for reference.   Imaging Studies: Overnight EEG with video  Result Date: 11/29/2022 Chase Quest, MD     11/30/2022  9:28 AM [name] MRN: [id number] Epilepsy Attending: Charlsie Edwards Referring Physician/Provider: Marjorie Smolder, NP Duration: 11/28/2022 1313 to 11/29/2022 1313 Patient history:  57 y.o. male with medical history significant of CAD status post CABG, hyperlipidemia, GERD, LPR, hypertension, anxiety, asthma, OSA presenting with concern for seizures. EEG to evaluate for seizure Level of alertness: Awake, asleep AEDs during EEG study: BRV Technical aspects: This EEG study was done with scalp electrodes positioned according to the 10-20 International system of electrode placement. Electrical activity was reviewed with band pass filter of 1-70Hz , sensitivity of 7 uV/mm, display speed of 74mm/sec with a 60Hz  notched filter applied as appropriate. EEG data were recorded continuously and digitally stored.  Video monitoring was available and reviewed as appropriate. Description: The posterior dominant rhythm consists of 10 Hz activity of moderate voltage (25-35 uV) seen predominantly in posterior head regions, symmetric and reactive to eye opening and eye closing. Sleep was characterized by vertex waves, sleep spindles (12 to 14  Hz), maximal frontocentral region, posterior occipital sharp transients of sleep ( POSTs). Hyperventilation and photic stimulation were not performed.   Patient was noted to have aphasia and headache at around 0800 on 11/29/2022. Concomitant eeg before, during and after the event didn't show any EEG change to suggest seizure. IMPRESSION: This study is within normal limits. No seizures or epileptiform discharges were seen throughout the recording. Patient was noted to have aphasia and headache at around 0800 on 11/29/2022. Without concomitant eeg change. This was not an epileptic event. A normal interictal EEG does not exclude the diagnosis of epilepsy. Chase Edwards   MR BRAIN W WO CONTRAST  Result Date: 11/28/2022 CLINICAL DATA:  New onset seizure EXAM: MRI HEAD WITHOUT AND WITH CONTRAST TECHNIQUE: Multiplanar, multiecho pulse sequences of the brain and surrounding structures were obtained without and  with intravenous contrast. CONTRAST:  10mL GADAVIST GADOBUTROL 1 MMOL/ML IV SOLN COMPARISON:  CT head 11/09/2022, MR head 11/08/2022. FINDINGS: Brain: There is no acute intracranial hemorrhage, extra-axial fluid collection, or acute infarct. Parenchymal volume is normal. The ventricles are normal in size. Parenchymal signal is normal. There is no cortical encephalomalacia. The corpus callosum is normally formed. The hippocampi are normal in signal and architecture. There is no structural or migration abnormality. There is no mass lesion or abnormal enhancement. There is no mass effect or midline shift. Vascular: Normal flow voids. Skull and upper cervical spine: Normal marrow signal. Sinuses/Orbits: Partial opacification of the left sphenoid sinus is unchanged. The globes and orbits are unremarkable. Other: None. IMPRESSION: Normal appearance of the brain with no acute intracranial pathology or epileptogenic focus identified. Electronically Signed   By: Lesia Hausen M.D.   On: 11/28/2022 19:43   EEG adult  Result Date: 11/11/2022 Chase Clines, MD     11/23/2022  9:49 AM ELECTROENCEPHALOGRAM REPORT Date of Study: 11/11/2022 Patient's Name: Chase Edwards MRN: [id number] Date of Birth: 01-17-1966 Referring Provider: Dr. Patrcia Dolly Clinical History: This is a 57 year old man with recurrent episodes of inability to speak. He had an episode just prior to EEG, stat EEG ordered after patient had event in office. Medications: Aspirin, Zetia, Pravachol, Toprol, Cozaar Technical Summary: A multichannel digital 1-hour EEG recording measured by the international 10-20 system with electrodes applied with paste and impedances below 5000 ohms performed in our laboratory with EKG monitoring in an awake and asleep patient.  Hyperventilation was not performed. Photic stimulation was performed.  The digital EEG was referentially recorded, reformatted, and digitally filtered in a variety of bipolar and referential montages for optimal  display.  Description: The patient is awake and asleep during the recording.  During maximal wakefulness, there is a symmetric, medium voltage 10 Hz posterior dominant rhythm that attenuates with eye opening.  The record is symmetric.  During drowsiness and sleep, there is an increase in theta slowing of the background.  Vertex waves and symmetric sleep spindles were seen. Hyperventilation and photic stimulation did not elicit any abnormalities.  There were no focal slowing, no epileptiform discharges or electrographic seizures seen.  EKG lead was unremarkable. Impression: This 1-hour awake and asleep EEG is normal.  Clinical Correlation: A normal EEG does not exclude a clinical diagnosis of epilepsy.  If further clinical questions remain, prolonged EEG may be helpful.  Clinical correlation is advised. Patrcia Dolly, M.D.    Microbiology: Results for orders placed or performed during the hospital encounter of 12/24/20  Resp Panel by RT-PCR (Flu A&B, Covid) Nasopharyngeal Swab  Status: None   Collection Time: 12/24/20 12:13 PM   Specimen: Nasopharyngeal Swab; Nasopharyngeal(NP) swabs in vial transport medium  Result Value Ref Range Status   SARS Coronavirus 2 by RT PCR NEGATIVE NEGATIVE Final    Comment: (NOTE) SARS-CoV-2 target nucleic acids are NOT DETECTED.  The SARS-CoV-2 RNA is generally detectable in upper respiratory specimens during the acute phase of infection. The lowest concentration of SARS-CoV-2 viral copies this assay can detect is 138 copies/mL. A negative result does not preclude SARS-Cov-2 infection and should not be used as the sole basis for treatment or other patient management decisions. A negative result may occur with  improper specimen collection/handling, submission of specimen other than nasopharyngeal swab, presence of viral mutation(s) within the areas targeted by this assay, and inadequate number of viral copies(<138 copies/mL). A negative result must be combined  with clinical observations, patient history, and epidemiological information. The expected result is Negative.  Fact Sheet for Patients:  BloggerCourse.com  Fact Sheet for Healthcare Providers:  SeriousBroker.it  This test is no t yet approved or cleared by the [place] FDA and  has been authorized for detection and/or diagnosis of SARS-CoV-2 by FDA under an Emergency Use Authorization (EUA). This EUA will remain  in effect (meaning this test can be used) for the duration of the COVID-19 declaration under Section 564(b)(1) of the Act, 21 U.S.C.section 360bbb-3(b)(1), unless the authorization is terminated  or revoked sooner.       Influenza A by PCR NEGATIVE NEGATIVE Final   Influenza B by PCR NEGATIVE NEGATIVE Final    Comment: (NOTE) The Xpert Xpress SARS-CoV-2/FLU/RSV plus assay is intended as an aid in the diagnosis of influenza from Nasopharyngeal swab specimens and should not be used as a sole basis for treatment. Nasal washings and aspirates are unacceptable for Xpert Xpress SARS-CoV-2/FLU/RSV testing.  Fact Sheet for Patients: BloggerCourse.com  Fact Sheet for Healthcare Providers: SeriousBroker.it  This test is not yet approved or cleared by the [place] FDA and has been authorized for detection and/or diagnosis of SARS-CoV-2 by FDA under an Emergency Use Authorization (EUA). This EUA will remain in effect (meaning this test can be used) for the duration of the COVID-19 declaration under Section 564(b)(1) of the Act, 21 U.S.C. section 360bbb-3(b)(1), unless the authorization is terminated or revoked.  Performed at [facility], [address] [place], [place] [address]     Labs: CBC: Recent Labs  Lab 11/28/22 1143 11/29/22 0444  WBC 7.8 6.4  HGB 15.9 15.1  HCT 47.8 43.8  MCV 90.2 87.4  PLT 260 210   Basic Metabolic Panel: Recent Labs   Lab 11/28/22 1143 11/28/22 2048 11/29/22 0444 11/30/22 0244  NA 138  --  139 137  K 3.9  --  3.9 4.0  CL 103  --  104 105  CO2 23  --  24 24  GLUCOSE 94  --  99 110*  BUN 18  --  15 12  CREATININE 1.17  --  1.13 1.28*  CALCIUM 9.1  --  8.4* 8.3*  MG  --  1.9  --  2.1   Liver Function Tests: No results for input(s): "AST", "ALT", "ALKPHOS", "BILITOT", "PROT", "ALBUMIN" in the last 168 hours. CBG: No results for input(s): "GLUCAP" in the last 168 hours.  Discharge time spent: approximately 35 minutes spent on discharge counseling, evaluation of patient on day of discharge, and coordination of discharge planning with nursing, social work, pharmacy and case management  Signed:  Alberteen Sam, MD [facility] 11/30/2022

## 2022-11-30 NOTE — Progress Notes (Signed)
Patient noted upset after speaking with the neurologist and removed his TELE monitor. RN advised important of TELE monitoring. Patient still declined.

## 2022-11-30 NOTE — TOC Transition Note (Signed)
Transition of Care [facility]) - CM/SW Discharge Note   Patient Details  Name: Chase Edwards MRN: [id number] Date of Birth: 04/22/1966  [facility] CM/SW Contact:  Kermit Balo, RN Phone Number: 11/30/2022, 12:29 PM   Clinical Narrative:    Pt is discharging home with self care. No needs per TOC.   Final next level of care: Home/Self Care Barriers to Discharge: No Barriers Identified   Patient Goals and CMS Choice      Discharge Placement                         Discharge Plan and Services Additional resources added to the After Visit Summary for     Discharge Planning Services: CM Consult                                 Social Determinants of Health (SDOH) Interventions SDOH Screenings   Food Insecurity: No Food Insecurity (11/28/2022)  Housing: Low Risk  (11/28/2022)  Transportation Needs: No Transportation Needs (11/28/2022)  Utilities: Not At Risk (11/28/2022)  Tobacco Use: Low Risk  (11/28/2022)     Readmission Risk Interventions     No data to display

## 2022-11-30 NOTE — Progress Notes (Signed)
EEG D/C'd. No noted skin break down. [facility] was notified. Patient had clothes on and ready to leave.

## 2022-11-30 NOTE — Telephone Encounter (Signed)
Pt called no answer left a voice mail to call the office back  °

## 2022-11-30 NOTE — Telephone Encounter (Signed)
Spoke to [place] at [facility] regarding referral for esophageal manometry & pH study - they called patient to schedule andhe told them he had a stroke and would call them if he needed to have study done

## 2022-12-02 ENCOUNTER — Ambulatory Visit (INDEPENDENT_AMBULATORY_CARE_PROVIDER_SITE_OTHER): Payer: BC Managed Care – PPO | Admitting: Neurology

## 2022-12-02 ENCOUNTER — Encounter: Payer: Self-pay | Admitting: Neurology

## 2022-12-02 VITALS — BP 121/78 | HR 74 | Ht 74.0 in | Wt 255.4 lb

## 2022-12-02 DIAGNOSIS — G43109 Migraine with aura, not intractable, without status migrainosus: Secondary | ICD-10-CM | POA: Diagnosis not present

## 2022-12-02 DIAGNOSIS — R404 Transient alteration of awareness: Secondary | ICD-10-CM | POA: Diagnosis not present

## 2022-12-02 DIAGNOSIS — R569 Unspecified convulsions: Secondary | ICD-10-CM

## 2022-12-02 MED ORDER — ZONISAMIDE 100 MG PO CAPS: ORAL_CAPSULE | ORAL | 5 refills | Status: DC

## 2022-12-02 NOTE — Patient Instructions (Addendum)
Good to see you.   Continue Zonisamide 50mg  every night for 2 weeks, then increase to 100mg  every night  2. Continue as needed sumatriptan for headache  3. Proceed with Cognitive Behavioral Therapy as scheduled  4. Follow-up on July 11, call for any changes
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
## 2022-12-15 ENCOUNTER — Ambulatory Visit: Payer: BC Managed Care – PPO | Admitting: Neurology

## 2022-12-23 ENCOUNTER — Telehealth: Payer: Self-pay | Admitting: Neurology

## 2022-12-23 DIAGNOSIS — Z0279 Encounter for issue of other medical certificate: Secondary | ICD-10-CM

## 2022-12-23 NOTE — Telephone Encounter (Signed)
Patients wife is clling about document that were to be faxed to patients employer /[employer]

## 2022-12-29 ENCOUNTER — Ambulatory Visit: Payer: BC Managed Care – PPO | Admitting: Neurology

## 2022-12-29 ENCOUNTER — Encounter: Payer: Self-pay | Admitting: Neurology

## 2022-12-29 VITALS — BP 115/77 | HR 85 | Ht 74.0 in | Wt 262.2 lb

## 2022-12-29 DIAGNOSIS — G43109 Migraine with aura, not intractable, without status migrainosus: Secondary | ICD-10-CM

## 2022-12-29 DIAGNOSIS — R569 Unspecified convulsions: Secondary | ICD-10-CM

## 2022-12-29 MED ORDER — SUMATRIPTAN SUCCINATE 25 MG PO TABS: 25.0000 mg | ORAL_TABLET | Freq: Two times a day (BID) | ORAL | 6 refills | Status: DC | PRN

## 2022-12-29 MED ORDER — ZONISAMIDE 100 MG PO CAPS: ORAL_CAPSULE | ORAL | 5 refills | Status: DC

## 2022-12-29 NOTE — Patient Instructions (Signed)
Good to see you doing better! Continue all your medications, refills sent.  If Tele-Doc does not work, you can try: [facility] [phone] [email or web address]  Follow-up in 3 months, call for any changes.
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
## 2023-04-10 ENCOUNTER — Ambulatory Visit: Payer: BC Managed Care – PPO | Admitting: Neurology

## 2023-05-03 IMAGING — DX DG CHEST 1V PORT
1 series · 2 of 2 positions shown · non-contrast
Comparison: Prior chest radiographs 12/28/2020 and earlier.

CLINICAL DATA: Provided history: Chest pain.

EXAM:
PORTABLE CHEST 1 VIEW

[Series 1: chest · 0.14mm/px · 2 of 2 slices shown]
[im 1/2]
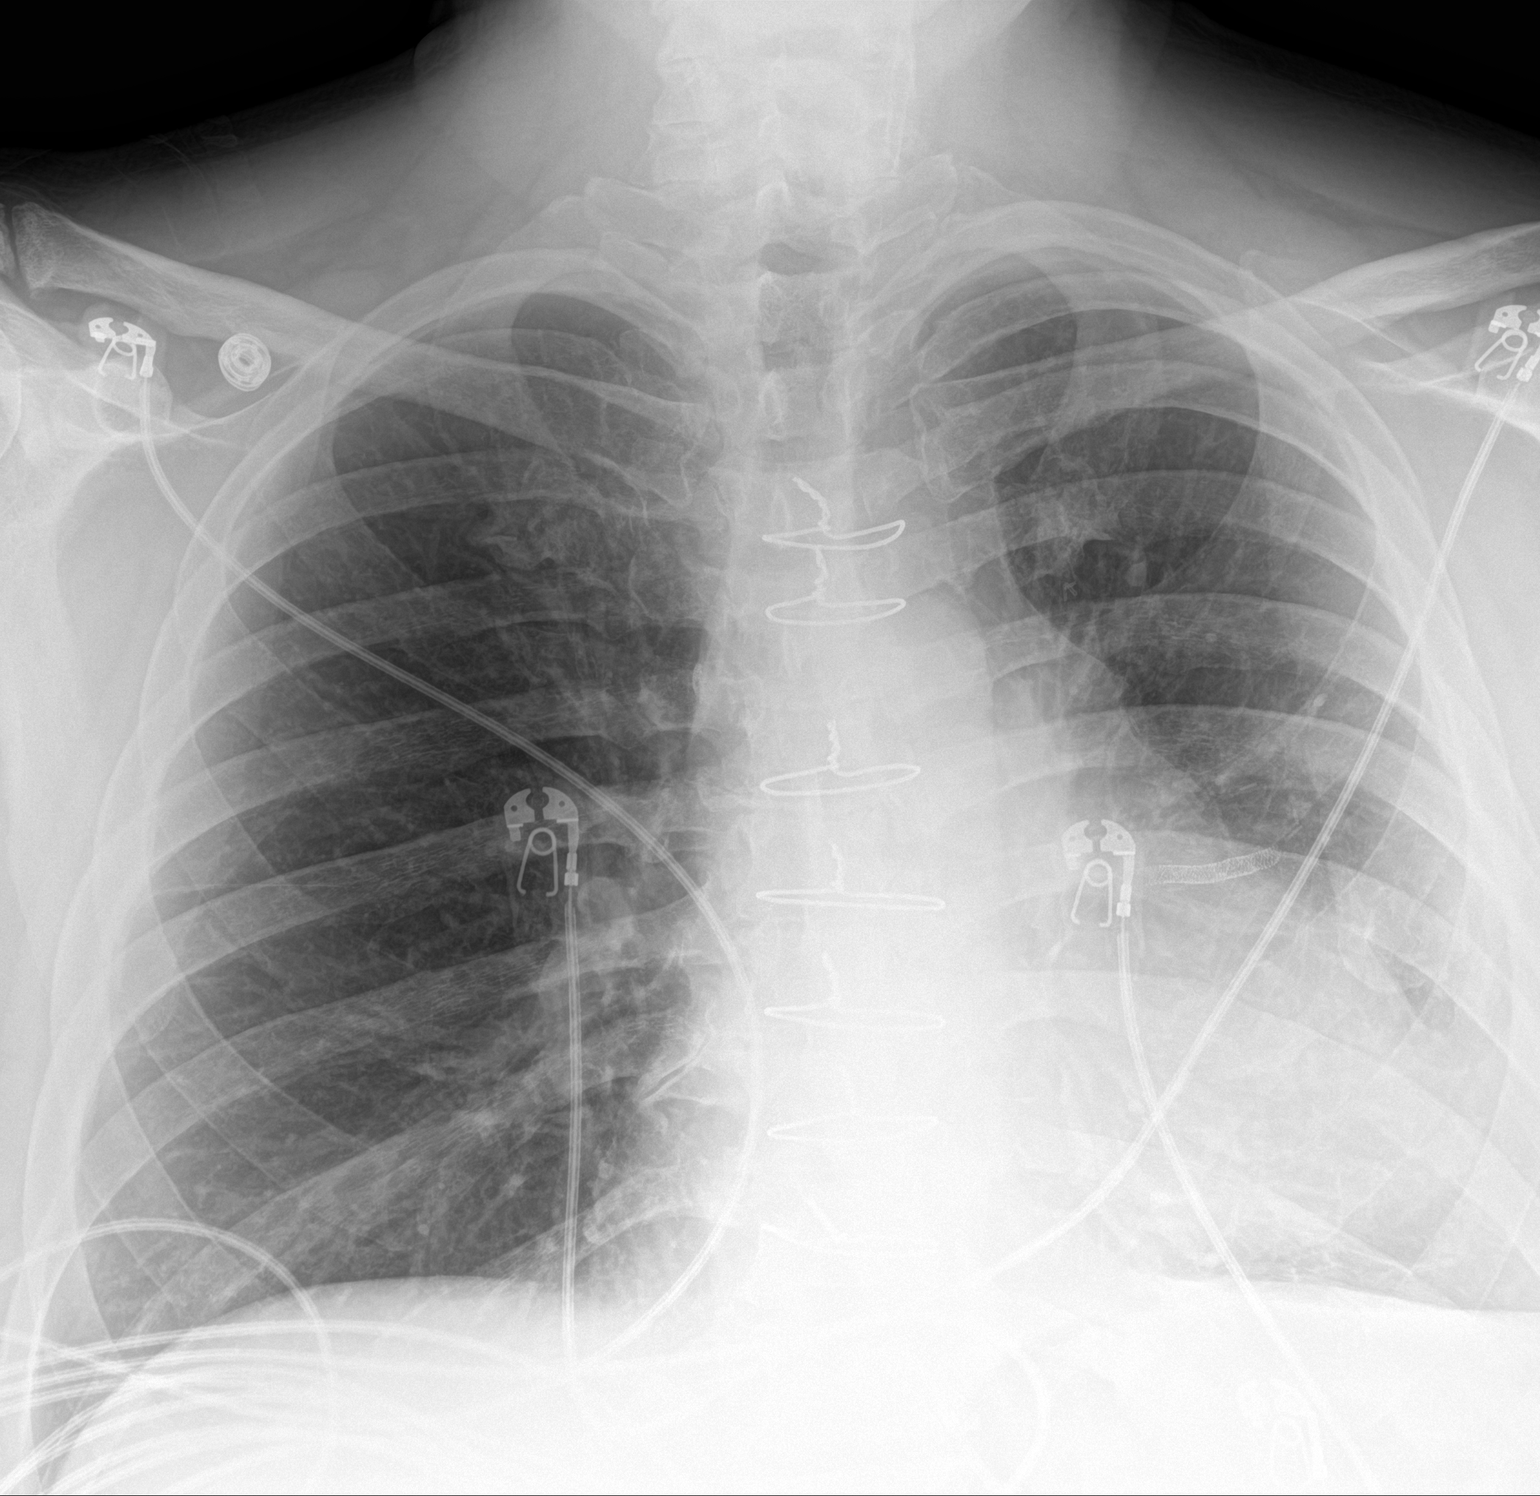
[im 2/2]
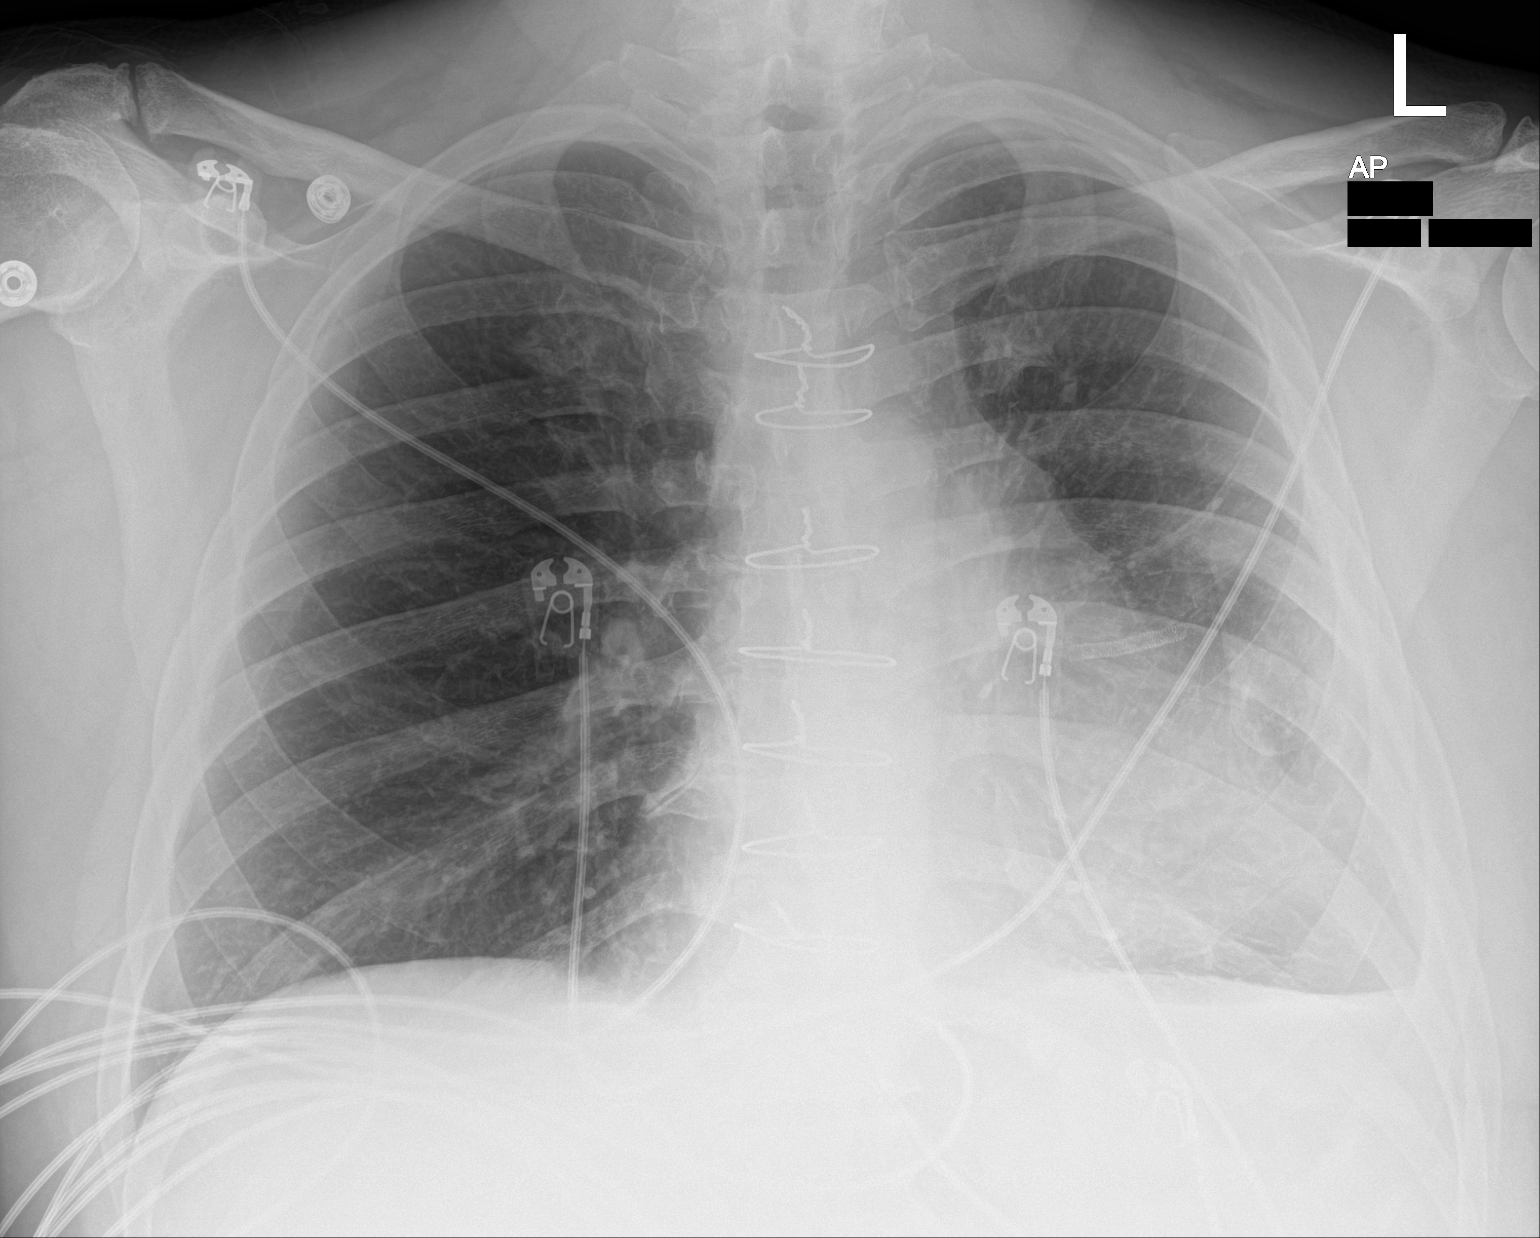

[2 of 2 positions shown; findings below may reference images not displayed]

FINDINGS: Prior median sternotomy. Heart size at the upper limits of normal,
unchanged. Aortic atherosclerosis. Prior coronary artery stenting.
Small left pleural effusion. No appreciable airspace consolidation
or pulmonary edema. No evidence of pneumothorax. No acute bony
abnormality identified.
IMPRESSION: Small left pleural effusion.

No appreciable airspace consolidation or pulmonary edema.

Aortic Atherosclerosis ([id number]).

## 2023-06-20 ENCOUNTER — Ambulatory Visit: Payer: Self-pay | Admitting: Neurology

## 2023-08-06 ENCOUNTER — Other Ambulatory Visit: Payer: Self-pay | Admitting: Gastroenterology

## 2023-08-08 NOTE — Telephone Encounter (Signed)
Needs ov in 08/2023, one year follow up.

## 2023-08-15 NOTE — Telephone Encounter (Signed)
 Called patient and left a message asking him to call into the office to get scheduled for visit in order to get refills on protonix.

## 2023-08-26 ENCOUNTER — Other Ambulatory Visit: Payer: Self-pay | Admitting: Neurology

## 2023-08-28 NOTE — Progress Notes (Unsigned)
 GI Office Note    Referring Provider: [facility] Primary Care Physician:  [facility]  Primary Gastroenterologist: Dr. Levon Hedger  Chief Complaint   No chief complaint on file.   History of Present Illness   Chase Edwards is a 58 y.o. male presenting today for follow up. Last seen 08/2022. H/o GERD/cough evaluated by both pulmonology and ENT previously.   Patient reports history of asthma, obstructive sleep apnea, CAD status post CABG, chronic GERD, hypertension.  History of coronary artery stent(s) with subsequent CABG.  Currently followed by cardiology in the [facility], Dr. Sharrell Ku.  History of obesity, used to weigh 310 pounds, actively making changes to lose weight.  ***Down to 266 pounds with goal of reaching 220 pounds.   Patient has a longstanding history of GERD dating back at least 10 years.  He has been on multiple PPIs in the past without significant improvement of his symptoms. Seen by GI (Dr. Dorna Leitz) [facility] 03/2021.  Tried and failed omeprazole 40mg  BID, pantoprazole 40mg  BID, Nexium 40mg  BID, prevacid 30mg  BID.  Dexilant denied by insurance, never able to try it.  Completed EGD and colonoscopy as outlined below.  Patient notes after his EGD with esophageal dilation, his reflux symptoms were better for about a month.  At that time he was on pantoprazole once daily, sulcal fate 4 times daily.   Since last ov, he had EGD 10/2022: z-line irregular, 40cm s/p bx. Normal stomach. Mucosal nodule found in duodenum. S/p biopsy. 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 Next EGD in five years.   He was referred to [facility] for esophageal manometry and pH study but he did not complete due to hospitalizaitons, patinet reported stroke but imaging without strok and second hosopitalizaiton with concern for seizures.   CTA chest with contrast December 2023: No evidence of pulmonary embolus to the proximal segmental pulmonary  artery level.  Hypoventilatory change in the dependent lungs with linear band of atelectasis/scarring in the left lower lobe and lingula, small hiatal hernia.   Right upper quadrant ultrasound July 2022: Normal gallbladder.   EGD 05/2021: esophagus normal. Empiric dilaiton with Savary dilator (17mm). Esophageal biopsies taken from distal and proximal esophagus. Esophageal biopsies unremarkable.    Colonoscopy 05/2021: 7mm semi-pedunculated polyp removed from ascending colon (tubular adenoma), medium internal hemorrhoids.  Medications   Current Outpatient Medications  Medication Sig Dispense Refill   albuterol (VENTOLIN HFA) 108 (90 Base) MCG/ACT inhaler Inhale 1 puff into the lungs every 6 (six) hours as needed for shortness of breath.     APAP-Pamabrom-Pyrilamine 500-25-15 MG TABS Take 3 tablets by mouth at bedtime.     aspirin 81 MG chewable tablet Chew 1 tablet (81 mg total) by mouth daily. 90 tablet 1   BREZTRI AEROSPHERE 160-9-4.8 MCG/ACT AERO Inhale 2 puffs into the lungs in the morning and at bedtime.     ezetimibe (ZETIA) 10 MG tablet Take 10 mg by mouth daily.     ipratropium-albuterol (DUONEB) 0.5-2.5 (3) MG/3ML SOLN Inhale 3 mLs into the lungs every 12 (twelve) hours as needed (shortness of breath).     loratadine (CLARITIN) 10 MG tablet Take 20 mg by mouth daily.     losartan (COZAAR) 50 MG tablet Take 50 mg by mouth daily.     metoprolol succinate (TOPROL-XL) 25 MG 24 hr tablet Take 50 mg by mouth daily.     nitroGLYCERIN (NITROSTAT) 0.4 MG SL tablet Place 0.4 mg under the  tongue every 5 (five) minutes as needed for chest pain.     pantoprazole (PROTONIX) 40 MG tablet TAKE 1 TABLET (40 MG TOTAL) BY MOUTH TWICE A DAY BEFORE MEALS 180 tablet 0   pravastatin (PRAVACHOL) 80 MG tablet Take 80 mg by mouth daily.     sildenafil (VIAGRA) 100 MG tablet 1/2-1 tab po prn (Patient taking differently: Take 50 mg by mouth daily as needed for erectile dysfunction. 1/2-1 tab po prn) 30 tablet  prn   SUMAtriptan (IMITREX) 25 MG tablet Take 1 tablet (25 mg total) by mouth 2 (two) times daily as needed for migraine or headache. May repeat in 2 hours if headache persists or recurs. 10 tablet 6   zonisamide (ZONEGRAN) 100 MG capsule Take 1 capsule every night 30 capsule 5   No current facility-administered medications for this visit.    Allergies   Allergies as of 08/29/2023 - Review Complete 12/29/2022  Allergen Reaction Noted   Phenergan [promethazine]  12/24/2020     Past Medical History   Past Medical History:  Diagnosis Date   Asthma    Coronary artery disease    GERD (gastroesophageal reflux disease)    Hypertension    Sleep apnea     Past Surgical History   Past Surgical History:  Procedure Laterality Date   APPENDECTOMY     BIOPSY  10/21/2022   Procedure: BIOPSY;  Surgeon: Dolores Frame, MD;  Location: [facility];  Service: Gastroenterology;;   CORONARY ARTERY BYPASS GRAFT     CORONARY STENT INTERVENTION     ESOPHAGOGASTRODUODENOSCOPY (EGD) WITH PROPOFOL N/A 10/21/2022   Procedure: ESOPHAGOGASTRODUODENOSCOPY (EGD) WITH PROPOFOL;  Surgeon: Dolores Frame, MD;  Location: [facility];  Service: Gastroenterology;  Laterality: N/A;  7:30 AM   LEFT HEART CATH AND CORS/GRAFTS ANGIOGRAPHY N/A 12/24/2020   Procedure: LEFT HEART CATH AND CORS/GRAFTS ANGIOGRAPHY;  Surgeon: Yvonne Kendall, MD;  Location: [facility] INVASIVE CV LAB;  Service: Cardiovascular;  Laterality: N/A;    Past Family History   Family History  Problem Relation Age of Onset   Colon cancer Neg Hx     Past Social History   Social History   Socioeconomic History   Marital status: Single    Spouse name: Not on file   Number of children: Not on file   Years of education: Not on file   Highest education level: Not on file  Occupational History   Not on file  Tobacco Use   Smoking status: Never    Passive exposure: Never   Smokeless tobacco: Never  Vaping Use   Vaping  status: Never Used  Substance and Sexual Activity   Alcohol use: Not Currently   Drug use: Never   Sexual activity: Not on file  Other Topics Concern   Not on file  Social History Narrative   Are you right handed or left handed?  Right    Are you currently employed ?    What is your current occupation?   Do you live at home alone? No    Who lives with you? Family    What type of home do you live in: 1 story or 2 story? 2 story        Social Drivers of Corporate investment banker Strain: Low Risk  (12/06/2021)   Received from [facility]   Overall Financial Resource Strain (CARDIA)    Difficulty of Paying Living Expenses: Not very hard  Food Insecurity: No Food Insecurity (  11/28/2022)   Hunger Vital Sign    Worried About Running Out of Food in the Last Year: Never true    Ran Out of Food in the Last Year: Never true  Transportation Needs: No Transportation Needs (11/28/2022)   PRAPARE - Administrator, Civil Service (Medical): No    Lack of Transportation (Non-Medical): No  Physical Activity: Sufficiently Active (06/10/2021)   Received from [facility] visits prior to 08/20/2022., [facility] visits prior to 08/20/2022.   Exercise Vital Sign    Days of Exercise per Week: 5 days    Minutes of Exercise per Session: 150+ min  Stress: No Stress Concern Present (12/06/2021)   Received from [facility] of Occupational Health - Occupational Stress Questionnaire    Feeling of Stress : Not at all  Social Connections: Unknown (12/06/2021)   Received from [facility]   Social Network    Social Network: Not on file  Intimate Partner Violence: Not At Risk (11/28/2022)   Humiliation, Afraid, Rape, and Kick questionnaire    Fear of Current or Ex-Partner: No    Emotionally Abused: No    Physically Abused: No    Sexually Abused: No    Review of Systems   General:  Negative for anorexia, weight loss, fever, chills, fatigue, weakness. ENT: Negative for hoarseness, difficulty swallowing , nasal congestion. CV: Negative for chest pain, angina, palpitations, dyspnea on exertion, peripheral edema.  Respiratory: Negative for dyspnea at rest, dyspnea on exertion, cough, sputum, wheezing.  GI: See history of present illness. GU:  Negative for dysuria, hematuria, urinary incontinence, urinary frequency, nocturnal urination.  Endo: Negative for unusual weight change.     Physical Exam   There were no vitals taken for this visit.   General: Well-nourished, well-developed in no acute distress.  Eyes: No icterus. Mouth: Oropharyngeal mucosa moist and pink , no lesions erythema or exudate. Lungs: Clear to auscultation bilaterally.  Heart: Regular rate and rhythm, no murmurs rubs or gallops.  Abdomen: Bowel sounds are normal, nontender, nondistended, no hepatosplenomegaly or masses,  no abdominal bruits or hernia , no rebound or guarding.  Rectal: ***  Extremities: No lower extremity edema. No clubbing or deformities. Neuro: Alert and oriented x 4   Skin: Warm and dry, no jaundice.   Psych: Alert and cooperative, normal mood and affect.  Labs   *** Imaging Studies   No results found.  Assessment       PLAN   ***   Leanna Battles. Melvyn Neth, MHS, PA-C [facility]

## 2023-08-29 ENCOUNTER — Encounter: Payer: Self-pay | Admitting: Gastroenterology

## 2023-08-29 ENCOUNTER — Ambulatory Visit (INDEPENDENT_AMBULATORY_CARE_PROVIDER_SITE_OTHER): Payer: Self-pay | Admitting: Gastroenterology

## 2023-08-29 VITALS — BP 135/88 | HR 86 | Temp 97.9°F | Ht 74.0 in | Wt 270.4 lb

## 2023-08-29 DIAGNOSIS — Z860101 Personal history of adenomatous and serrated colon polyps: Secondary | ICD-10-CM

## 2023-08-29 DIAGNOSIS — K219 Gastro-esophageal reflux disease without esophagitis: Secondary | ICD-10-CM | POA: Diagnosis not present

## 2023-08-29 MED ORDER — PANTOPRAZOLE SODIUM 40 MG PO TBEC: 40.0000 mg | DELAYED_RELEASE_TABLET | Freq: Two times a day (BID) | ORAL | 3 refills | Status: AC

## 2023-08-29 NOTE — Patient Instructions (Signed)
 Continue pantoprazole 40mg  twice daily before breakfast and evening meal.  Next colonoscopy in 05/2026 unless you develop new symptoms which require sooner exam.  Return office visit in one year or sooner if needed.

## 2023-09-26 ENCOUNTER — Other Ambulatory Visit: Payer: Self-pay | Admitting: Neurology

## 2023-10-21 ENCOUNTER — Other Ambulatory Visit: Payer: Self-pay | Admitting: Neurology

## 2023-10-30 NOTE — Telephone Encounter (Signed)
Left message to contact

## 2024-01-15 ENCOUNTER — Ambulatory Visit: Payer: Self-pay | Admitting: Neurology

## 2024-01-15 ENCOUNTER — Encounter: Payer: Self-pay | Admitting: Neurology

## 2024-01-15 VITALS — BP 113/77 | HR 77 | Ht 74.0 in | Wt 268.0 lb

## 2024-01-15 DIAGNOSIS — R569 Unspecified convulsions: Secondary | ICD-10-CM | POA: Diagnosis not present

## 2024-01-15 DIAGNOSIS — G43109 Migraine with aura, not intractable, without status migrainosus: Secondary | ICD-10-CM | POA: Diagnosis not present

## 2024-01-15 MED ORDER — ZONISAMIDE 100 MG PO CAPS: ORAL_CAPSULE | ORAL | 4 refills | Status: AC

## 2024-01-15 MED ORDER — SUMATRIPTAN SUCCINATE 25 MG PO TABS: 25.0000 mg | ORAL_TABLET | Freq: Two times a day (BID) | ORAL | 11 refills | Status: AC | PRN

## 2024-01-15 NOTE — Patient Instructions (Signed)
 Good to see you doing well! Continue Zonisamide  100mg  every night and as needed sumatriptan . Follow-up in 1 year, call for any changes.
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
## 2024-02-13 ENCOUNTER — Telehealth: Payer: Self-pay | Admitting: Gastroenterology

## 2024-02-13 ENCOUNTER — Encounter (HOSPITAL_COMMUNITY): Payer: Self-pay | Admitting: Gastroenterology

## 2024-02-13 NOTE — Telephone Encounter (Signed)
 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  Will continue with same interval frequency for endoscopy as prior.

## 2024-02-16 ENCOUNTER — Emergency Department (HOSPITAL_COMMUNITY)

## 2024-02-16 ENCOUNTER — Other Ambulatory Visit: Payer: Self-pay

## 2024-02-16 ENCOUNTER — Encounter (HOSPITAL_COMMUNITY): Payer: Self-pay | Admitting: Emergency Medicine

## 2024-02-16 ENCOUNTER — Emergency Department (HOSPITAL_COMMUNITY)
Admission: EM | Admit: 2024-02-16 | Discharge: 2024-02-16 | Disposition: A | Discharge status: Home / Self Care | Payer: Worker's Compensation | Attending: Emergency Medicine | Admitting: Emergency Medicine

## 2024-02-16 DIAGNOSIS — Z7982 Long term (current) use of aspirin: Secondary | ICD-10-CM | POA: Insufficient documentation

## 2024-02-16 DIAGNOSIS — Z7951 Long term (current) use of inhaled steroids: Secondary | ICD-10-CM | POA: Diagnosis not present

## 2024-02-16 DIAGNOSIS — S51831A Puncture wound without foreign body of right forearm, initial encounter: Secondary | ICD-10-CM | POA: Insufficient documentation

## 2024-02-16 DIAGNOSIS — I2510 Atherosclerotic heart disease of native coronary artery without angina pectoris: Secondary | ICD-10-CM | POA: Diagnosis not present

## 2024-02-16 DIAGNOSIS — W5911XA Bitten by nonvenomous snake, initial encounter: Secondary | ICD-10-CM | POA: Insufficient documentation

## 2024-02-16 DIAGNOSIS — Y990 Civilian activity done for income or pay: Secondary | ICD-10-CM | POA: Insufficient documentation

## 2024-02-16 DIAGNOSIS — S59911A Unspecified injury of right forearm, initial encounter: Secondary | ICD-10-CM | POA: Diagnosis present

## 2024-02-16 DIAGNOSIS — J45909 Unspecified asthma, uncomplicated: Secondary | ICD-10-CM | POA: Diagnosis not present

## 2024-02-16 LAB — CBC WITH DIFFERENTIAL/PLATELET
Abs Immature Granulocytes: 0.02 K/uL (ref 0.00–0.07)
Basophils Absolute: 0 K/uL (ref 0.0–0.1)
Basophils Relative: 1 %
Eosinophils Absolute: 0.1 K/uL (ref 0.0–0.5)
Eosinophils Relative: 2 %
HCT: 46.1 % (ref 39.0–52.0)
Hemoglobin: 16.3 g/dL (ref 13.0–17.0)
Immature Granulocytes: 0 %
Lymphocytes Relative: 37 %
Lymphs Abs: 2.4 K/uL (ref 0.7–4.0)
MCH: 31.3 pg (ref 26.0–34.0)
MCHC: 35.4 g/dL (ref 30.0–36.0)
MCV: 88.5 fL (ref 80.0–100.0)
Monocytes Absolute: 0.9 K/uL (ref 0.1–1.0)
Monocytes Relative: 13 %
Neutro Abs: 3.1 K/uL (ref 1.7–7.7)
Neutrophils Relative %: 47 %
Platelets: 282 K/uL (ref 150–400)
RBC: 5.21 MIL/uL (ref 4.22–5.81)
RDW: 11.9 % (ref 11.5–15.5)
WBC: 6.5 K/uL (ref 4.0–10.5)
nRBC: 0 % (ref 0.0–0.2)

## 2024-02-16 LAB — COMPREHENSIVE METABOLIC PANEL WITH GFR
ALT: 21 U/L (ref 0–44)
AST: 20 U/L (ref 15–41)
Albumin: 4.1 g/dL (ref 3.5–5.0)
Alkaline Phosphatase: 64 U/L (ref 38–126)
Anion gap: 10 (ref 5–15)
BUN: 20 mg/dL (ref 6–20)
CO2: 23 mmol/L (ref 22–32)
Calcium: 9 mg/dL (ref 8.9–10.3)
Chloride: 105 mmol/L (ref 98–111)
Creatinine, Ser: 1.1 mg/dL (ref 0.61–1.24)
GFR, Estimated: >60 mL/min (ref >60)
Glucose, Bld: 92 mg/dL (ref 70–99)
Potassium: 3.9 mmol/L (ref 3.5–5.1)
Sodium: 138 mmol/L (ref 135–145)
Total Bilirubin: 1.2 mg/dL (ref 0.0–1.2)
Total Protein: 6.9 g/dL (ref 6.5–8.1)

## 2024-02-16 NOTE — ED Triage Notes (Signed)
 Pt states a copper head bit him on his right arm x1 hour ago. Small bite noted, no swelling at this time. Pt denies pain.

## 2024-02-16 NOTE — Discharge Instructions (Signed)
 Please follow-up with snakebite instructions you are given from poison control.  Essentially do not wrap the arm tightly, keep it elevated above the level of your heart, do not apply ice.  Use over-the-counter medication as needed for pain.  If you have increased swelling or bruising, start having abnormal bleeding please come back to the ER right away otherwise follow-up closely with PCP.

## 2024-02-16 NOTE — ED Provider Notes (Signed)
 [place] EMERGENCY DEPARTMENT AT [facility] Provider Note   CSN: [id number] Arrival date & time: 02/16/24  9089     Patient presents with: Snake Bite   Chase Edwards is a 58 y.o. male.  History of CAD, seizures, asthma.  Presents ER today with snake bite to the right forearm that occurred at work today.  He was Was out of the wood pile.  He was concerned that there were snakes so he was using the end of a hammer to get the wood up but one of the pieces fell and he grabbed it with his hand and he reports a copperhead struck and bit him on the arm.  He is some mild soreness.  He did not do anything to the arm, he kept it elevated and drove to the ER.  He denies any swelling, denies bruising, denies numbness or tingling but wanted to be evaluated.  He does report some mild nausea but no chest pain or shortness of breath.   HPI     Prior to Admission medications   Medication Sig Start Date End Date Taking? Authorizing Provider  albuterol  (VENTOLIN  HFA) 108 (90 Base) MCG/ACT inhaler Inhale 1 puff into the lungs every 6 (six) hours as needed for shortness of breath. 07/21/20  Yes [provider]  APAP-Pamabrom-Pyrilamine 500-25-15 MG TABS Take 3 tablets by mouth at bedtime.   Yes [provider]  aspirin  81 MG chewable tablet Chew 1 tablet (81 mg total) by mouth daily. 12/25/20  Yes Zhao, Xika, NP  BREZTRI AEROSPHERE 160-9-4.8 MCG/ACT AERO Inhale 2 puffs into the lungs in the morning and at bedtime.   Yes [provider]  ezetimibe  (ZETIA ) 10 MG tablet Take 10 mg by mouth daily.   Yes [provider]  ipratropium-albuterol  (DUONEB) 0.5-2.5 (3) MG/3ML SOLN Inhale 3 mLs into the lungs every 12 (twelve) hours as needed (shortness of breath). 07/05/22  Yes [provider]  loratadine (CLARITIN) 10 MG tablet Take 20 mg by mouth daily.   Yes [provider]  losartan  (COZAAR ) 50 MG tablet Take 50 mg by mouth daily. 10/25/21  Yes [provider]  metoprolol  succinate (TOPROL -XL) 25 MG 24 hr tablet Take 25 mg by mouth daily. 12/21/20  Yes [provider]  nitroGLYCERIN  (NITROSTAT ) 0.4 MG SL tablet Place 0.4 mg under the tongue every 5 (five) minutes as needed for chest pain. 12/22/20  Yes [provider]  pantoprazole  (PROTONIX ) 40 MG tablet Take 1 tablet (40 mg total) by mouth 2 (two) times daily before a meal. 08/29/23  Yes Ezzard Sonny RAMAN, PA-C  pravastatin (PRAVACHOL) 80 MG tablet Take 80 mg by mouth daily. 03/09/21  Yes [provider]  SUMAtriptan  (IMITREX ) 25 MG tablet Take 1 tablet (25 mg total) by mouth 2 (two) times daily as needed for migraine or headache. May repeat in 2 hours if headache persists or recurs. 01/15/24  Yes Georjean Darice HERO, MD  zonisamide  (ZONEGRAN ) 100 MG capsule TAKE 1 CAPSULE BY MOUTH EVERY NIGHT 01/15/24  Yes Georjean Darice HERO, MD    Allergies: Phenergan [promethazine]    Review of Systems  Updated Vital Signs BP 136/88 (BP Location: Right Arm)   Pulse 92   Temp 98.1 F (36.7 C) (Oral)   Resp 18   Ht 6' 2 (1.88 m)   Wt 121 kg   SpO2 96%   BMI 34.25 kg/m   Physical Exam Vitals and nursing note reviewed.  Constitutional:  General: He is not in acute distress.    Appearance: He is well-developed.  HENT:     Head: Normocephalic and atraumatic.  Eyes:     Conjunctiva/sclera: Conjunctivae normal.  Cardiovascular:     Rate and Rhythm: Normal rate and regular rhythm.     Heart sounds: No murmur heard. Pulmonary:     Effort: Pulmonary effort is normal. No respiratory distress.     Breath sounds: Normal breath sounds.  Abdominal:     Palpations: Abdomen is soft.     Tenderness: There is no abdominal tenderness.  Musculoskeletal:        General: No swelling.     Cervical back: Neck supple.  Skin:    General: Skin is warm and dry.     Capillary Refill: Capillary refill takes less than 2 seconds.  Neurological:     General: No focal deficit present.      Mental Status: He is alert and oriented to person, place, and time.  Psychiatric:        Mood and Affect: Mood normal.     (all labs ordered are listed, but only abnormal results are displayed) Labs Reviewed  COMPREHENSIVE METABOLIC PANEL WITH GFR  CBC WITH DIFFERENTIAL/PLATELET    EKG: None  Radiology: DG Forearm Right Result Date: 02/16/2024 CLINICAL DATA:  Snake bite. EXAM: RIGHT FOREARM - 2 VIEW COMPARISON:  None. FINDINGS: There is no evidence of fracture or other focal bone lesions. Soft tissues are unremarkable. IMPRESSION: Negative. Electronically Signed   By: Harrietta Sherry M.D.   On: 02/16/2024 14:51     Procedures   Medications Ordered in the ED - No data to display  Clinical Course as of 02/16/24 1549  Fri Feb 16, 2024  1119 Spoke with poison control.  Recommended 6-hour observation.  Patient has no swelling at this time and no pain at the site.  For him to elevate the arm, wash with soap and water.  No limitation for emergent labs that he is having swelling or increased pain would recommend platelets, fibrinogen, PT and PTT.  They are going to send guidelines and measuring sheet via fax [CB]    Clinical Course User Index [CB] Suellen Sherran LABOR, PA-C                                 Medical Decision Making Amount and/or Complexity of Data Reviewed Labs: ordered.    Details: CBC and CMP normal Radiology: ordered.   Differential diagnosis includes but not limited to copperhead envenomation, dry bite, puncture wound, other  ED course: Patient reported he was bitten by a copperhead at work today outside on his right forearm.  He does have 2 puncture wounds consistent with a snake bite but no surrounding bruising redness or swelling.  His arm was measured according to poison control protocol sequentially and patient has had no change in the swelling, no change the pain he has no axillary pain.  He is kept his arm elevated per poison control protocol.  They did not  recommend fibrinogen or PT/INR unless he developed any increased swelling or bruising which he did not.  They are comfortable with him going home with precautions to come back if he has new or worsening his symptoms and instructed to keep his arm elevated, avoid ice or constricting clothing.  He could theoretically get antivenom after 24 hours so he is instructed that if he is getting  worse to come back to the ER.  He was provided with the snake bite home care sheet from poison control.  Below is the measurements of his arm:    Final diagnoses:  Snake bite, initial encounter    ED Discharge Orders     None          Suellen Sherran DELENA DEVONNA 02/16/24 1549    Towana Ozell BROCKS, MD 02/16/24 [phone]

## 2024-03-21 ENCOUNTER — Ambulatory Visit: Attending: Cardiology | Admitting: Cardiology

## 2024-03-21 ENCOUNTER — Encounter: Payer: Self-pay | Admitting: Cardiology

## 2024-03-21 NOTE — Progress Notes (Deleted)
 Clinical Summary Chase Edwards is a 58 y.o.male  Previously followed by [facility]   1.CAD - history of CABG in 2018 LIMA-LAD - repeat LHC in 2022 with no new significant obstructive CAD  - 10/2022 [facility] echo: LVEF >55%, mild MR - 08/2023 nuclear stress: no clear ischemia  2. HTN   3. HLD   4. OSA?  Past Medical History:  Diagnosis Date   Asthma    Coronary artery disease    GERD (gastroesophageal reflux disease)    Hypertension    Sleep apnea      Allergies  Allergen Reactions   Phenergan [Promethazine]     Seizures and vomiting     Current Outpatient Medications  Medication Sig Dispense Refill   albuterol  (VENTOLIN  HFA) 108 (90 Base) MCG/ACT inhaler Inhale 1 puff into the lungs every 6 (six) hours as needed for shortness of breath.     APAP-Pamabrom-Pyrilamine 500-25-15 MG TABS Take 3 tablets by mouth at bedtime.     aspirin  81 MG chewable tablet Chew 1 tablet (81 mg total) by mouth daily. 90 tablet 1   BREZTRI AEROSPHERE 160-9-4.8 MCG/ACT AERO Inhale 2 puffs into the lungs in the morning and at bedtime.     ezetimibe  (ZETIA ) 10 MG tablet Take 10 mg by mouth daily.     ipratropium-albuterol  (DUONEB) 0.5-2.5 (3) MG/3ML SOLN Inhale 3 mLs into the lungs every 12 (twelve) hours as needed (shortness of breath).     loratadine (CLARITIN) 10 MG tablet Take 20 mg by mouth daily.     losartan  (COZAAR ) 50 MG tablet Take 50 mg by mouth daily.     metoprolol  succinate (TOPROL -XL) 25 MG 24 hr tablet Take 25 mg by mouth daily.     nitroGLYCERIN  (NITROSTAT ) 0.4 MG SL tablet Place 0.4 mg under the tongue every 5 (five) minutes as needed for chest pain.     pantoprazole  (PROTONIX ) 40 MG tablet Take 1 tablet (40 mg total) by mouth 2 (two) times daily before a meal. 180 tablet 3   pravastatin (PRAVACHOL) 80 MG tablet Take 80 mg by mouth daily.     SUMAtriptan  (IMITREX ) 25 MG tablet Take 1 tablet (25 mg total) by mouth 2 (two) times daily as needed for migraine or headache. May  repeat in 2 hours if headache persists or recurs. 10 tablet 11   zonisamide  (ZONEGRAN ) 100 MG capsule TAKE 1 CAPSULE BY MOUTH EVERY NIGHT 90 capsule 4   No current facility-administered medications for this visit.     Past Surgical History:  Procedure Laterality Date   APPENDECTOMY     BIOPSY  10/21/2022   Procedure: BIOPSY;  Surgeon: Eartha Angelia Sieving, MD;  [facility];  Service: Gastroenterology;;   CORONARY ARTERY BYPASS GRAFT     CORONARY STENT INTERVENTION     ESOPHAGOGASTRODUODENOSCOPY (EGD) WITH PROPOFOL  N/A 10/21/2022   Procedure: ESOPHAGOGASTRODUODENOSCOPY (EGD) WITH PROPOFOL ;  Surgeon: Eartha Angelia Sieving, MD;  [facility]: [facility];  Service: Gastroenterology;  Laterality: N/A;  7:30 AM   LEFT HEART CATH AND CORS/GRAFTS ANGIOGRAPHY N/A 12/24/2020   Procedure: LEFT HEART CATH AND CORS/GRAFTS ANGIOGRAPHY;  Surgeon: Mady Bruckner, MD;  [facility];  Service: Cardiovascular;  Laterality: N/A;     Allergies  Allergen Reactions   Phenergan [Promethazine]     Seizures and vomiting      Family History  Problem Relation Age of Onset   Colon cancer Neg Hx      Social  History Mr. Mulford reports that he has never smoked. He has never been exposed to tobacco smoke. He has never used smokeless tobacco. Mr. Rzepka reports that he does not currently use alcohol.   Review of Systems CONSTITUTIONAL: No weight loss, fever, chills, weakness or fatigue.  HEENT: Eyes: No visual loss, blurred vision, double vision or yellow sclerae.No hearing loss, sneezing, congestion, runny nose or sore throat.  SKIN: No rash or itching.  CARDIOVASCULAR:  RESPIRATORY: No shortness of breath, cough or sputum.  GASTROINTESTINAL: No anorexia, nausea, vomiting or diarrhea. No abdominal pain or blood.  GENITOURINARY: No burning on urination, no polyuria NEUROLOGICAL: No headache, dizziness, syncope, paralysis, ataxia, numbness or tingling in the extremities. No  change in bowel or bladder control.  MUSCULOSKELETAL: No muscle, back pain, joint pain or stiffness.  LYMPHATICS: No enlarged nodes. No history of splenectomy.  PSYCHIATRIC: No history of depression or anxiety.  ENDOCRINOLOGIC: No reports of sweating, cold or heat intolerance. No polyuria or polydipsia.  SABRA   Physical Examination There were no vitals filed for this visit. There were no vitals filed for this visit.  Gen: resting comfortably, no acute distress HEENT: no scleral icterus, pupils equal round and reactive, no palptable cervical adenopathy,  CV Resp: Clear to auscultation bilaterally GI: abdomen is soft, non-tender, non-distended, normal bowel sounds, no hepatosplenomegaly MSK: extremities are warm, no edema.  Skin: warm, no rash Neuro:  no focal deficits Psych: appropriate affect   Diagnostic Studies  TTE 11/08/2022:  Summary 1. The left ventricle is normal in size with normal wall thickness. 2. The left ventricular systolic function is normal, LVEF is visually estimated at > 55%. 3. There is mild mitral valve regurgitation. 4. The left atrium is mildly dilated in size. 5. The right ventricle is normal in size, with normal systolic function. 6. There is no evidence of an interatrial flow communication or intrapulmonary shunt by agitated saline study.    Assessment and Plan        Dorn PHEBE Ross, M.D., F.A.C.C.

## 2024-03-25 ENCOUNTER — Other Ambulatory Visit: Payer: Self-pay | Admitting: Urology

## 2024-03-25 DIAGNOSIS — N5201 Erectile dysfunction due to arterial insufficiency: Secondary | ICD-10-CM

## 2024-04-03 ENCOUNTER — Encounter (INDEPENDENT_AMBULATORY_CARE_PROVIDER_SITE_OTHER): Payer: Self-pay | Admitting: Gastroenterology

## 2024-04-04 ENCOUNTER — Encounter: Payer: Self-pay | Admitting: Cardiology

## 2024-04-04 ENCOUNTER — Ambulatory Visit: Attending: Cardiology | Admitting: Cardiology

## 2024-04-04 VITALS — BP 130/82 | HR 75 | Ht 74.0 in | Wt 274.0 lb

## 2024-04-04 DIAGNOSIS — I10 Essential (primary) hypertension: Secondary | ICD-10-CM | POA: Diagnosis not present

## 2024-04-04 DIAGNOSIS — E782 Mixed hyperlipidemia: Secondary | ICD-10-CM | POA: Diagnosis not present

## 2024-04-04 DIAGNOSIS — I2510 Atherosclerotic heart disease of native coronary artery without angina pectoris: Secondary | ICD-10-CM

## 2024-04-04 NOTE — Patient Instructions (Signed)
 Medication Instructions:  Continue all current medications.   Labwork: none  Testing/Procedures: none  Follow-Up: 6 months   Any Other Special Instructions Will Be Listed Below (If Applicable).   If you need a refill on your cardiac medications before your next appointment, please call your pharmacy.

## 2024-04-04 NOTE — Progress Notes (Signed)
 Clinical Summary Chase Edwards is a 58 y.o.male seen today as a new patient. Previously followed by [facility] cardiology  1.CAD - history of CABG in 2018 (1-vessel LIMA-LAD). He reports this was done in [place] in setting of MI - cath 2022 no new significant disease per [facility] clinic note, cannot find cath report  10/2022 echo [facility]: LEVEF >55%, mild MR - atypical chest pain 08/2023 [facility] visit, was to have nuclear stress - 08/2023 nuclear stress: artifact vs scar apical, no ischemia.  - no chest pains.  2. HTN - cough on lisnopril, now on losartan  - home bp's 130s/80s  3. HLD -on atorvastatin  reports muscle aches, then changed pravastatin and has tolerated - upcoming with labs with pcp  4.OSA - has cpap, follow by pcp   Past Medical History:  Diagnosis Date   Asthma    Coronary artery disease    GERD (gastroesophageal reflux disease)    Hypertension    Sleep apnea      Allergies  Allergen Reactions   Phenergan [Promethazine]     Seizures and vomiting     Current Outpatient Medications  Medication Sig Dispense Refill   albuterol  (VENTOLIN  HFA) 108 (90 Base) MCG/ACT inhaler Inhale 1 puff into the lungs every 6 (six) hours as needed for shortness of breath.     APAP-Pamabrom-Pyrilamine 500-25-15 MG TABS Take 3 tablets by mouth at bedtime.     aspirin  81 MG chewable tablet Chew 1 tablet (81 mg total) by mouth daily. 90 tablet 1   BREZTRI AEROSPHERE 160-9-4.8 MCG/ACT AERO Inhale 2 puffs into the lungs in the morning and at bedtime.     desvenlafaxine (PRISTIQ) 50 MG 24 hr tablet Take 50 mg by mouth daily.     ezetimibe  (ZETIA ) 10 MG tablet Take 10 mg by mouth daily.     ibuprofen (ADVIL) 600 MG tablet Take 600 mg by mouth every 6 (six) hours.     ipratropium-albuterol  (DUONEB) 0.5-2.5 (3) MG/3ML SOLN Inhale 3 mLs into the lungs every 12 (twelve) hours as needed (shortness of breath).     loratadine (CLARITIN) 10 MG tablet Take 20 mg by mouth daily.     losartan  (COZAAR ) 50  MG tablet Take 50 mg by mouth daily.     metoprolol  succinate (TOPROL -XL) 50 MG 24 hr tablet Take 50 mg by mouth daily.     nitroGLYCERIN  (NITROSTAT ) 0.4 MG SL tablet Place 0.4 mg under the tongue every 5 (five) minutes as needed for chest pain.     pantoprazole  (PROTONIX ) 40 MG tablet Take 1 tablet (40 mg total) by mouth 2 (two) times daily before a meal. 180 tablet 3   pravastatin (PRAVACHOL) 80 MG tablet Take 80 mg by mouth daily.     QUEtiapine (SEROQUEL) 200 MG tablet Take 200 mg by mouth at bedtime.     SUMAtriptan  (IMITREX ) 25 MG tablet Take 1 tablet (25 mg total) by mouth 2 (two) times daily as needed for migraine or headache. May repeat in 2 hours if headache persists or recurs. 10 tablet 11   zonisamide  (ZONEGRAN ) 100 MG capsule TAKE 1 CAPSULE BY MOUTH EVERY NIGHT 90 capsule 4   metoprolol  succinate (TOPROL -XL) 25 MG 24 hr tablet Take 25 mg by mouth daily. (Patient not taking: Reported on 04/04/2024)     No current facility-administered medications for this visit.     Past Surgical History:  Procedure Laterality Date   APPENDECTOMY     BIOPSY  10/21/2022  Procedure: BIOPSY;  Surgeon: Eartha Flavors, Toribio, MD;  [facility];  Service: Gastroenterology;;   CORONARY ARTERY BYPASS GRAFT     CORONARY STENT INTERVENTION     ESOPHAGOGASTRODUODENOSCOPY (EGD) WITH PROPOFOL  N/A 10/21/2022   Procedure: ESOPHAGOGASTRODUODENOSCOPY (EGD) WITH PROPOFOL ;  Surgeon: Eartha Flavors Toribio, MD;  [facility];  Service: Gastroenterology;  Laterality: N/A;  7:30 AM   LEFT HEART CATH AND CORS/GRAFTS ANGIOGRAPHY N/A 12/24/2020   Procedure: LEFT HEART CATH AND CORS/GRAFTS ANGIOGRAPHY;  Surgeon: Mady Bruckner, MD;  [facility];  Service: Cardiovascular;  Laterality: N/A;     Allergies  Allergen Reactions   Phenergan [Promethazine]     Seizures and vomiting      Family History  Problem Relation Age of Onset   Colon cancer Neg Hx      Social  History Chase Edwards reports that he has never smoked. He has never been exposed to tobacco smoke. He has never used smokeless tobacco. Chase Edwards reports that he does not currently use alcohol.     Physical Examination Today's Vitals   04/04/24 1525 04/04/24 1550  BP: (!) 132/90 130/82  Pulse: 75   SpO2: 97%   Weight: 274 lb (124.3 kg)   Height: 6' 2 (1.88 m)    Body mass index is 35.18 kg/m.  Gen: resting comfortably, no acute distress HEENT: no scleral icterus, pupils equal round and reactive, no palptable cervical adenopathy,  CV: RRR, no mrg, no jvd Resp: Clear to auscultation bilaterally GI: abdomen is soft, non-tender, non-distended, normal bowel sounds, no hepatosplenomegaly MSK: extremities are warm, no edema.  Skin: warm, no rash Neuro:  no focal deficits Psych: appropriate affect   Diagnostic Studies  TTE 11/08/2022:  Summary 1. The left ventricle is normal in size with normal wall thickness. 2. The left ventricular systolic function is normal, LVEF is visually estimated at > 55%. 3. There is mild mitral valve regurgitation. 4. The left atrium is mildly dilated in size. 5. The right ventricle is normal in size, with normal systolic function. 6. There is no evidence of an interatrial flow communication or intrapulmonary shunt by agitated saline study.    08/2023 nuclear stress [facility] - There is a small, moderately severe, fixed perfusion defect involving  the apical and apical inferior segments. This is consistent with probable  artifact but cannot rule out scar.  - Left ventricular systolic function is normal. Post stress the ejection  fraction is > 60%.   Assessment and Plan  1.CAD - no recent symptoms - EKG today shows NSR - continue current meds  2. HTN -at goal, continue current meds  3. HLD - upcoming labs with pcp, f/u results - continue current meds, may be able to come off zetia  pending LDL and treat with statin alone. Does not meet criteria for  very high risk, LDL <70 would be reasonable target for him.       Dorn PHEBE Ross, M.D.

## 2024-05-08 ENCOUNTER — Encounter: Payer: Self-pay | Admitting: Neurology

## 2024-06-10 ENCOUNTER — Ambulatory Visit: Admitting: Cardiology

## 2024-07-17 ENCOUNTER — Telehealth: Payer: Self-pay | Admitting: Cardiology

## 2024-07-17 MED ORDER — LOSARTAN POTASSIUM 50 MG PO TABS: 50.0000 mg | ORAL_TABLET | Freq: Every day | ORAL | 1 refills | Status: AC

## 2024-07-17 MED ORDER — PRAVASTATIN SODIUM 80 MG PO TABS: 80.0000 mg | ORAL_TABLET | Freq: Every day | ORAL | 1 refills | Status: AC

## 2024-07-17 MED ORDER — EZETIMIBE 10 MG PO TABS: 10.0000 mg | ORAL_TABLET | Freq: Every day | ORAL | 1 refills | Status: AC

## 2024-07-17 MED ORDER — METOPROLOL SUCCINATE ER 50 MG PO TB24: 50.0000 mg | ORAL_TABLET | Freq: Every day | ORAL | 1 refills | Status: AC

## 2024-07-17 NOTE — Telephone Encounter (Signed)
 Per Dr. Alvan, ok to fill.

## 2024-07-17 NOTE — Telephone Encounter (Signed)
" °*  STAT* If patient is at the pharmacy, call can be transferred to refill team.   1. Which medications need to be refilled? (please list name of each medication and dose if known)   ezetimibe  (ZETIA ) 10 MG tablet  losartan  (COZAAR ) 50 MG tablet  metoprolol  succinate (TOPROL -XL) 50 MG 24 hr tablet  pravastatin  (PRAVACHOL ) 80 MG tablet    2. Would you like to learn more about the convenience, safety, & potential cost savings by using the [facility]?   3. Are you open to using the [facility] (Type [facility]. ).  4. Which pharmacy/location (including street and city if local pharmacy) is medication to be sent to?  [id number] - [place], [place] - [address]   5. Do they need a 30 day or 90 day supply? 90 day  Wife Judith) stated patient is almost out of these medications.   Patient has appointment scheduled with Dr. Alvan on 4/27.   "

## 2024-07-17 NOTE — Telephone Encounter (Signed)
 Pt requesting refills for the following meds below, however, doesn't look like we have been prescribing?  Is ok to fill?

## 2024-07-22 ENCOUNTER — Telehealth: Payer: Self-pay | Admitting: Cardiology

## 2024-07-22 ENCOUNTER — Emergency Department (HOSPITAL_COMMUNITY)

## 2024-07-22 ENCOUNTER — Encounter (HOSPITAL_COMMUNITY): Payer: Self-pay

## 2024-07-22 ENCOUNTER — Observation Stay (HOSPITAL_COMMUNITY): Admission: EM | Admit: 2024-07-22 | Discharge: 2024-07-24 | Disposition: A

## 2024-07-22 ENCOUNTER — Other Ambulatory Visit: Payer: Self-pay

## 2024-07-22 DIAGNOSIS — R079 Chest pain, unspecified: Secondary | ICD-10-CM | POA: Diagnosis not present

## 2024-07-22 DIAGNOSIS — I10 Essential (primary) hypertension: Secondary | ICD-10-CM | POA: Diagnosis present

## 2024-07-22 DIAGNOSIS — G4733 Obstructive sleep apnea (adult) (pediatric): Secondary | ICD-10-CM | POA: Diagnosis not present

## 2024-07-22 DIAGNOSIS — Z951 Presence of aortocoronary bypass graft: Secondary | ICD-10-CM | POA: Diagnosis not present

## 2024-07-22 DIAGNOSIS — E66812 Obesity, class 2: Secondary | ICD-10-CM

## 2024-07-22 HISTORY — DX: Acute myocardial infarction, unspecified: I21.9

## 2024-07-22 LAB — CBC
HCT: 48.1 % (ref 39.0–52.0)
Hemoglobin: 16.7 g/dL (ref 13.0–17.0)
MCH: 30.6 pg (ref 26.0–34.0)
MCHC: 34.7 g/dL (ref 30.0–36.0)
MCV: 88.3 fL (ref 80.0–100.0)
Platelets: 247 10*3/uL (ref 150–400)
RBC: 5.45 MIL/uL (ref 4.22–5.81)
RDW: 12.2 % (ref 11.5–15.5)
WBC: 8.5 10*3/uL (ref 4.0–10.5)
nRBC: 0 % (ref 0.0–0.2)

## 2024-07-22 LAB — BASIC METABOLIC PANEL WITH GFR
Anion gap: 14 (ref 5–15)
BUN: 14 mg/dL (ref 6–20)
CO2: 20 mmol/L — ABNORMAL LOW (ref 22–32)
Calcium: 9.2 mg/dL (ref 8.9–10.3)
Chloride: 107 mmol/L (ref 98–111)
Creatinine, Ser: 1.31 mg/dL — ABNORMAL HIGH (ref 0.61–1.24)
GFR, Estimated: >60 mL/min
Glucose, Bld: 91 mg/dL (ref 70–99)
Potassium: 4.3 mmol/L (ref 3.5–5.1)
Sodium: 141 mmol/L (ref 135–145)

## 2024-07-22 LAB — TROPONIN T, HIGH SENSITIVITY
Troponin T High Sensitivity: 6 ng/L (ref 0–19)
Troponin T High Sensitivity: <6 ng/L (ref 0–19)

## 2024-07-22 LAB — PRO BRAIN NATRIURETIC PEPTIDE: Pro Brain Natriuretic Peptide: 55.9 pg/mL

## 2024-07-22 MED ORDER — ASPIRIN 325 MG PO TABS
325.0000 mg | ORAL_TABLET | Freq: Once | ORAL | Status: AC
  Administered 2024-07-22: 325 mg via ORAL
  Filled 2024-07-22: qty 1

## 2024-07-22 MED ORDER — PANTOPRAZOLE SODIUM 40 MG PO TBEC
40.0000 mg | DELAYED_RELEASE_TABLET | Freq: Two times a day (BID) | ORAL | Status: DC
  Administered 2024-07-23: 40 mg via ORAL
  Filled 2024-07-22 (×3): qty 1

## 2024-07-22 MED ORDER — ONDANSETRON HCL 4 MG PO TABS: 4.0000 mg | ORAL_TABLET | Freq: Four times a day (QID) | ORAL | Status: DC | PRN

## 2024-07-22 MED ORDER — PRAVASTATIN SODIUM 40 MG PO TABS
80.0000 mg | ORAL_TABLET | Freq: Every day | ORAL | Status: DC
  Administered 2024-07-23: 80 mg via ORAL
  Filled 2024-07-22: qty 2

## 2024-07-22 MED ORDER — ONDANSETRON HCL 4 MG/2ML IJ SOLN
4.0000 mg | Freq: Once | INTRAMUSCULAR | Status: AC
  Administered 2024-07-22: 4 mg via INTRAVENOUS
  Filled 2024-07-22: qty 2

## 2024-07-22 MED ORDER — ACETAMINOPHEN 325 MG PO TABS: 650.0000 mg | ORAL_TABLET | Freq: Four times a day (QID) | ORAL | Status: DC | PRN

## 2024-07-22 MED ORDER — IPRATROPIUM-ALBUTEROL 0.5-2.5 (3) MG/3ML IN SOLN: 3.0000 mL | Freq: Two times a day (BID) | RESPIRATORY_TRACT | Status: DC | PRN

## 2024-07-22 MED ORDER — ACETAMINOPHEN 650 MG RE SUPP: 650.0000 mg | Freq: Four times a day (QID) | RECTAL | Status: DC | PRN

## 2024-07-22 MED ORDER — FENTANYL CITRATE (PF) 100 MCG/2ML IJ SOLN
50.0000 ug | Freq: Once | INTRAMUSCULAR | Status: AC
  Administered 2024-07-22: 50 ug via INTRAVENOUS
  Filled 2024-07-22: qty 2

## 2024-07-22 MED ORDER — SODIUM CHLORIDE 0.9% FLUSH
3.0000 mL | Freq: Once | INTRAVENOUS | Status: AC
  Administered 2024-07-22: 3 mL via INTRAVENOUS

## 2024-07-22 MED ORDER — NITROGLYCERIN 2 % TD OINT
0.5000 [in_us] | TOPICAL_OINTMENT | Freq: Once | TRANSDERMAL | Status: AC
  Administered 2024-07-22: 0.5 [in_us] via TOPICAL
  Filled 2024-07-22: qty 1

## 2024-07-22 MED ORDER — POLYETHYLENE GLYCOL 3350 17 G PO PACK: 17.0000 g | PACK | Freq: Every day | ORAL | Status: DC | PRN

## 2024-07-22 MED ORDER — ZONISAMIDE 100 MG PO CAPS
100.0000 mg | ORAL_CAPSULE | Freq: Every day | ORAL | Status: DC
  Administered 2024-07-22 – 2024-07-23 (×2): 100 mg via ORAL
  Filled 2024-07-22 (×2): qty 1

## 2024-07-22 MED ORDER — SUMATRIPTAN SUCCINATE 50 MG PO TABS: 25.0000 mg | ORAL_TABLET | Freq: Two times a day (BID) | ORAL | Status: DC | PRN

## 2024-07-22 MED ORDER — MORPHINE SULFATE (PF) 2 MG/ML IV SOLN
2.0000 mg | Freq: Once | INTRAVENOUS | Status: AC
  Administered 2024-07-22: 2 mg via INTRAVENOUS
  Filled 2024-07-22: qty 1

## 2024-07-22 MED ORDER — QUETIAPINE FUMARATE 100 MG PO TABS
200.0000 mg | ORAL_TABLET | Freq: Once | ORAL | Status: AC
  Administered 2024-07-22: 200 mg via ORAL
  Filled 2024-07-22: qty 2

## 2024-07-22 MED ORDER — MORPHINE SULFATE (PF) 2 MG/ML IV SOLN
2.0000 mg | INTRAVENOUS | Status: DC | PRN
  Administered 2024-07-22 – 2024-07-23 (×4): 2 mg via INTRAVENOUS
  Filled 2024-07-22 (×4): qty 1

## 2024-07-22 MED ORDER — BUDESON-GLYCOPYRROL-FORMOTEROL 160-9-4.8 MCG/ACT IN AERO
2.0000 | INHALATION_SPRAY | Freq: Two times a day (BID) | RESPIRATORY_TRACT | Status: DC
  Administered 2024-07-22 – 2024-07-23 (×3): 2 via RESPIRATORY_TRACT
  Filled 2024-07-22: qty 5.9

## 2024-07-22 MED ORDER — SODIUM CHLORIDE 0.9 % IV BOLUS
500.0000 mL | Freq: Once | INTRAVENOUS | Status: AC
  Administered 2024-07-22: 500 mL via INTRAVENOUS

## 2024-07-22 MED ORDER — ASPIRIN 81 MG PO CHEW
81.0000 mg | CHEWABLE_TABLET | Freq: Every day | ORAL | Status: DC
  Administered 2024-07-24: 81 mg via ORAL
  Filled 2024-07-22 (×3): qty 1

## 2024-07-22 MED ORDER — ONDANSETRON HCL 4 MG/2ML IJ SOLN
4.0000 mg | Freq: Four times a day (QID) | INTRAMUSCULAR | Status: DC | PRN
  Administered 2024-07-23: 4 mg via INTRAVENOUS
  Filled 2024-07-22: qty 2

## 2024-07-22 MED ORDER — ENOXAPARIN SODIUM 60 MG/0.6ML IJ SOSY
60.0000 mg | PREFILLED_SYRINGE | INTRAMUSCULAR | Status: DC
  Administered 2024-07-22: 60 mg via SUBCUTANEOUS
  Filled 2024-07-22: qty 0.6

## 2024-07-22 NOTE — Progress Notes (Signed)
" °   07/22/24 1932  TOC Brief Assessment  Insurance and Status Reviewed  Patient has primary care physician Yes  Home environment has been reviewed Home with Spouse  Prior level of function: Independent  Prior/Current Home Services No current home services  Social Drivers of Health Review SDOH reviewed no interventions necessary  Readmission risk has been reviewed Yes  Transition of care needs no transition of care needs at this time   Inpatient Care Manager (ICM) has reviewed patient and no ICM needs have been identified at this time. We will continue to monitor patient advancement through interdisciplinary progression rounds. If new patient transition needs arise, please place a ICM consult.   "

## 2024-07-22 NOTE — ED Triage Notes (Signed)
 Patient arrives POV from home c/c chest pain x1 week with progressively worsening in addition to nausea, vomiting, dizziness.

## 2024-07-22 NOTE — Telephone Encounter (Signed)
 Reports constant chest pain and SOB, nausea, and dizziness for the past week, rated 4/5. Reports he is currently working. Denies active chest pain. Reports non-productive cough. Denies sore throat, fever, chills, congestion or coughing. Reports he has not used nitroglycerin . Advised to use his nitroglycerin  as prescribed. Advised that he needed an evaluation in the ED with with symptoms he is describing. Appointment arranged with [facility] on 07/26/2024 @4 :00 pm. Encouraged to seek an evaluation by the ED and he agreed stating he would go today. Verbalized understanding of plan.

## 2024-07-22 NOTE — Progress Notes (Signed)
 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

## 2024-07-22 NOTE — Telephone Encounter (Signed)
" ° °  Pt c/o of Chest Pain: STAT if active CP, including tightness, pressure, jaw pain, radiating pain to shoulder/upper arm/back, CP unrelieved by Nitro. Symptoms reported of SOB, nausea, vomiting, sweating.  1. Are you having CP right now? no    2. Are you experiencing any other symptoms (ex. SOB, nausea, vomiting, sweating)? Sob    3. Is your CP continuous or coming and going? Comes and goes   4. Have you taken Nitroglycerin ? no   5. How long have you been experiencing CP? Couple of days    6. If NO CP at time of call then end call with telling Pt to call back or call 911 if Chest pain returns prior to return call from triage team.  "

## 2024-07-22 NOTE — Progress Notes (Signed)
 PHARMACIST - PHYSICIAN COMMUNICATION  CONCERNING:  Enoxaparin  (Lovenox ) for DVT Prophylaxis    RECOMMENDATION: Patient was prescribed enoxaprin 40mg  q24 hours for VTE prophylaxis.   Filed Weights   07/22/24 1412 07/22/24 1818  Weight: 124.7 kg (275 lb) 126.6 kg (279 lb 1.6 oz)    Body mass index is 35.83 kg/m.  Estimated Creatinine Clearance: 88 mL/min (A) (by C-G formula based on SCr of 1.31 mg/dL (H)).   Based on [facility] policy patient is candidate for enoxaparin  0.5mg /kg TBW SQ every 24 hours based on BMI being >30.  DESCRIPTION: Pharmacy has adjusted enoxaparin  dose per [facility] policy.  Patient is now receiving enoxaparin  60 mg every 24 hours    Annabella LOISE Banks, PharmD Clinical Pharmacist  07/22/2024 6:33 PM

## 2024-07-23 ENCOUNTER — Other Ambulatory Visit (HOSPITAL_COMMUNITY): Payer: Self-pay | Admitting: *Deleted

## 2024-07-23 ENCOUNTER — Observation Stay (HOSPITAL_COMMUNITY)

## 2024-07-23 DIAGNOSIS — R079 Chest pain, unspecified: Secondary | ICD-10-CM | POA: Diagnosis not present

## 2024-07-23 LAB — TROPONIN T, HIGH SENSITIVITY
Troponin T High Sensitivity: 12 ng/L (ref 0–19)
Troponin T High Sensitivity: <6 ng/L (ref 0–19)

## 2024-07-23 LAB — ECHOCARDIOGRAM COMPLETE
Area-P 1/2: 3.72 cm2
Height: 74 in
S' Lateral: 3.2 cm
Weight: 4465.64 [oz_av]

## 2024-07-23 LAB — HIV ANTIBODY (ROUTINE TESTING W REFLEX): HIV Screen 4th Generation wRfx: NONREACTIVE

## 2024-07-23 LAB — HEPARIN LEVEL (UNFRACTIONATED): Heparin Unfractionated: 0.51 [IU]/mL (ref 0.30–0.70)

## 2024-07-23 MED ORDER — FENTANYL CITRATE (PF) 50 MCG/ML IJ SOSY
50.0000 ug | PREFILLED_SYRINGE | INTRAMUSCULAR | Status: DC | PRN
  Administered 2024-07-23: 50 ug via INTRAVENOUS
  Filled 2024-07-23: qty 1

## 2024-07-23 MED ORDER — HEPARIN (PORCINE) 25000 UT/250ML-% IV SOLN
1500.0000 [IU]/h | INTRAVENOUS | Status: DC
  Administered 2024-07-23 – 2024-07-24 (×2): 1500 [IU]/h via INTRAVENOUS
  Filled 2024-07-23 (×2): qty 250

## 2024-07-23 MED ORDER — DESVENLAFAXINE SUCCINATE ER 50 MG PO TB24
50.0000 mg | ORAL_TABLET | Freq: Every day | ORAL | Status: DC
  Administered 2024-07-23: 50 mg via ORAL

## 2024-07-23 MED ORDER — METOPROLOL SUCCINATE ER 50 MG PO TB24: 50.0000 mg | ORAL_TABLET | Freq: Every day | ORAL | Status: DC

## 2024-07-23 MED ORDER — EZETIMIBE 10 MG PO TABS
10.0000 mg | ORAL_TABLET | Freq: Every day | ORAL | Status: DC
  Administered 2024-07-23: 10 mg via ORAL
  Filled 2024-07-23 (×2): qty 1

## 2024-07-23 MED ORDER — FREE WATER
250.0000 mL | Freq: Once | Status: AC
  Administered 2024-07-24: 250 mL via ORAL

## 2024-07-23 MED ORDER — QUETIAPINE FUMARATE 100 MG PO TABS
100.0000 mg | ORAL_TABLET | Freq: Every day | ORAL | Status: DC
  Administered 2024-07-23: 100 mg via ORAL
  Filled 2024-07-23: qty 1

## 2024-07-23 MED ORDER — HEPARIN BOLUS VIA INFUSION
4000.0000 [IU] | Freq: Once | INTRAVENOUS | Status: AC
  Administered 2024-07-23: 4000 [IU] via INTRAVENOUS
  Filled 2024-07-23: qty 4000

## 2024-07-23 MED ORDER — ALUM & MAG HYDROXIDE-SIMETH 200-200-20 MG/5ML PO SUSP
30.0000 mL | Freq: Once | ORAL | Status: AC
  Administered 2024-07-23: 30 mL via ORAL
  Filled 2024-07-23: qty 30

## 2024-07-23 MED ORDER — PERFLUTREN LIPID MICROSPHERE
1.0000 mL | INTRAVENOUS | Status: AC | PRN
  Administered 2024-07-23: 3 mL via INTRAVENOUS

## 2024-07-23 MED ORDER — ALPRAZOLAM 0.25 MG PO TABS: 0.2500 mg | ORAL_TABLET | Freq: Three times a day (TID) | ORAL | Status: DC | PRN

## 2024-07-23 MED ORDER — BUDESON-GLYCOPYRROL-FORMOTEROL 160-9-4.8 MCG/ACT IN AERO: 2.0000 | INHALATION_SPRAY | Freq: Two times a day (BID) | RESPIRATORY_TRACT | Status: DC

## 2024-07-23 MED ORDER — LOSARTAN POTASSIUM 50 MG PO TABS: 50.0000 mg | ORAL_TABLET | Freq: Every day | ORAL | Status: DC

## 2024-07-23 MED ORDER — FENTANYL CITRATE (PF) 50 MCG/ML IJ SOSY
50.0000 ug | PREFILLED_SYRINGE | Freq: Once | INTRAMUSCULAR | Status: AC
  Administered 2024-07-23: 50 ug via INTRAVENOUS
  Filled 2024-07-23: qty 1

## 2024-07-23 MED ORDER — FENTANYL CITRATE (PF) 50 MCG/ML IJ SOSY: 50.0000 ug | PREFILLED_SYRINGE | Freq: Once | INTRAMUSCULAR | Status: DC

## 2024-07-23 NOTE — Plan of Care (Signed)

## 2024-07-23 NOTE — Hospital Course (Addendum)
 59 year old male with a history of coronary disease with history of CABG 2018, hypertension, PNES, OSA, hyperlipidemia presenting with exertional chest pressure.  He had associated dizziness, nausea, and diaphoresis when he was shoveling snow.  His symptoms improved at rest.  The patient also noted that he had had some chest discomfort climbing 1 flight of stairs at home.  Patient states that even during his periods of rest he never was completely pain-free.  He continued to have some low-level discomfort.  He had remittent radiation to his left arm.  He had similar symptoms when he had his CABG in 2018 [place] . 59 y.o. male with medical history significant for CABG, OSA, seizures, hypertension. He denies any fevers, chills, abdominal pain, diarrhea, hematochezia, melena. ED Course: Temperature 98.4.  Heart rate 60s to 98, respiratory rate 13-23.  Blood pressure systolic 97-119.  O2 sats 90 to 99% on room air.  Troponin < 6 x 2.  EKG without significant abnormalities.  Chest x-ray without acute abnormality, shows cardiomegaly, lingular scarring . Nitropaste given, but with drop in systolic blood pressure to 90s, this was removed. 500 mL bolus was given.   Cardiology was consulted.  The patient was started on IV heparin  for unstable angina.  He is being transferred to [facility] for left and right heart catheterization on 07/24/2024.

## 2024-07-23 NOTE — Progress Notes (Signed)
 PHARMACY - ANTICOAGULATION CONSULT NOTE  Pharmacy Consult for heparin  Indication: chest pain/ACS  Allergies[1]  Patient Measurements: Height: 6' 2 (188 cm) Weight: 126.6 kg (279 lb 1.6 oz) IBW/kg (Calculated) : 82.2 HEPARIN  DW (KG): 109.9  Vital Signs: Temp: 97.4 F (36.3 C) (02/03 1049) Temp Source: Oral (02/03 1049) BP: 118/82 (02/03 1201) Pulse Rate: 72 (02/03 1201)  Labs: Recent Labs    07/22/24 1443  HGB 16.7  HCT 48.1  PLT 247  CREATININE 1.31*    Estimated Creatinine Clearance: 88 mL/min (A) (by C-G formula based on SCr of 1.31 mg/dL (H)).   Medical History: Past Medical History:  Diagnosis Date   Asthma    Coronary artery disease    GERD (gastroesophageal reflux disease)    Hypertension    MI (myocardial infarction) (HCC)    Sleep apnea    Assessment: 59 year old male presents to [facility] with chest pain. Patient received low dose lovenox  last night, new orders to transition to heparin  in anticipation of cath tomorrow. Patient is not on blood thinners prior to admit, cbc within normal limits.   Goal of Therapy:  Heparin  level 0.3-0.7 units/ml Monitor platelets by anticoagulation protocol: Yes   Plan:  Give 4000 units bolus x 1 Start heparin  infusion at 1500 units/hr Check anti-Xa level in 6 hours and daily while on heparin  Continue to monitor H&H and platelets  Dempsey Blush PharmD., BCPS Clinical Pharmacist 07/23/2024 12:37 PM     [1]  Allergies Allergen Reactions   Phenergan [Promethazine]     Seizures and vomiting

## 2024-07-23 NOTE — Progress Notes (Signed)
 PHARMACY - ANTICOAGULATION CONSULT NOTE  Pharmacy Consult for heparin  Indication: chest pain/ACS  Allergies[1]  Patient Measurements: Height: 6' 2 (188 cm) Weight: 126.6 kg (279 lb 1.6 oz) IBW/kg (Calculated) : 82.2 HEPARIN  DW (KG): 109.9  Vital Signs: Temp: 97.8 F (36.6 C) (02/03 2015) Temp Source: Oral (02/03 2015) BP: 146/86 (02/03 2015) Pulse Rate: 76 (02/03 2015)  Labs: Recent Labs    07/22/24 1443 07/23/24 1957  HGB 16.7  --   HCT 48.1  --   PLT 247  --   HEPARINUNFRC  --  0.51  CREATININE 1.31*  --     Estimated Creatinine Clearance: 88 mL/min (A) (by C-G formula based on SCr of 1.31 mg/dL (H)).   Medical History: Past Medical History:  Diagnosis Date   Asthma    Coronary artery disease    GERD (gastroesophageal reflux disease)    Hypertension    MI (myocardial infarction) (HCC)    Sleep apnea    Assessment: 59 year old male presents to [facility] with chest pain. Patient received low dose lovenox  last night, new orders to transition to heparin  in anticipation of cath tomorrow. Patient is not on blood thinners prior to admit, cbc within normal limits.   2/3@1957 : HL 0.51, therapeutic x 1 @ 1500 units/hr  Goal of Therapy:  Heparin  level 0.3-0.7 units/ml Monitor platelets by anticoagulation protocol: Yes   Plan:  Continue heparin  infusion at 1500 units/hr Check anti-Xa level in 6 hours and daily while on heparin  Continue to monitor H&H and platelets  Tamara Kenyon A Becky Colan, PharmD Clinical Pharmacist 07/23/2024 9:37 PM       [1]  Allergies Allergen Reactions   Phenergan [Promethazine]     Seizures and vomiting

## 2024-07-23 NOTE — Progress Notes (Signed)
" °   07/23/24 1931  BiPAP/CPAP/SIPAP  Reason BIPAP/CPAP not in use Non-compliant   Patient declines CPAP at this time. No unit in room at this time. "

## 2024-07-23 NOTE — Progress Notes (Signed)
 Nurse at bedside,patient c/o chest pain rated a 5,a pressure type of pain,that's intermittent.Morphine  2 mg's given intravenous per MAR prn. Plan of care on going.Dr Afton Louder notified.

## 2024-07-23 NOTE — Progress Notes (Signed)
*  PRELIMINARY RESULTS* Echocardiogram 2D Echocardiogram has been performed with Definity .  Chase Edwards 07/23/2024, 9:53 AM

## 2024-07-23 NOTE — Plan of Care (Signed)
   Problem: Education: Goal: Knowledge of General Education information will improve Description Including pain rating scale, medication(s)/side effects and non-pharmacologic comfort measures Outcome: Progressing   Problem: Education: Goal: Knowledge of General Education information will improve Description Including pain rating scale, medication(s)/side effects and non-pharmacologic comfort measures Outcome: Progressing

## 2024-07-24 ENCOUNTER — Encounter (HOSPITAL_COMMUNITY): Admission: EM | Disposition: A | Payer: Self-pay | Source: Home / Self Care

## 2024-07-24 DIAGNOSIS — E66812 Obesity, class 2: Secondary | ICD-10-CM

## 2024-07-24 LAB — POCT I-STAT 7, (LYTES, BLD GAS, ICA,H+H)
Acid-base deficit: 4 mmol/L — ABNORMAL HIGH (ref 0.0–2.0)
Bicarbonate: 22.9 mmol/L (ref 20.0–28.0)
Calcium, Ion: 1.19 mmol/L (ref 1.15–1.40)
HCT: 44 % (ref 39.0–52.0)
Hemoglobin: 15 g/dL (ref 13.0–17.0)
O2 Saturation: 88 %
Potassium: 4 mmol/L (ref 3.5–5.1)
Sodium: 141 mmol/L (ref 135–145)
TCO2: 24 mmol/L (ref 22–32)
pCO2 arterial: 45.8 mmHg (ref 32–48)
pH, Arterial: 7.306 — ABNORMAL LOW (ref 7.35–7.45)
pO2, Arterial: 59 mmHg — ABNORMAL LOW (ref 83–108)

## 2024-07-24 LAB — POCT I-STAT EG7
Acid-base deficit: 2 mmol/L (ref 0.0–2.0)
Bicarbonate: 24.3 mmol/L (ref 20.0–28.0)
Calcium, Ion: 1.18 mmol/L (ref 1.15–1.40)
HCT: 44 % (ref 39.0–52.0)
Hemoglobin: 15 g/dL (ref 13.0–17.0)
O2 Saturation: 61 %
Potassium: 4 mmol/L (ref 3.5–5.1)
Sodium: 142 mmol/L (ref 135–145)
TCO2: 26 mmol/L (ref 22–32)
pCO2, Ven: 48.1 mmHg (ref 44–60)
pH, Ven: 7.312 (ref 7.25–7.43)
pO2, Ven: 35 mmHg (ref 32–45)

## 2024-07-24 LAB — BASIC METABOLIC PANEL WITH GFR
Anion gap: 12 (ref 5–15)
BUN: 19 mg/dL (ref 6–20)
CO2: 24 mmol/L (ref 22–32)
Calcium: 8.2 mg/dL — ABNORMAL LOW (ref 8.9–10.3)
Chloride: 108 mmol/L (ref 98–111)
Creatinine, Ser: 1.41 mg/dL — ABNORMAL HIGH (ref 0.61–1.24)
GFR, Estimated: 58 mL/min — ABNORMAL LOW
Glucose, Bld: 93 mg/dL (ref 70–99)
Potassium: 3.8 mmol/L (ref 3.5–5.1)
Sodium: 143 mmol/L (ref 135–145)

## 2024-07-24 LAB — CBC
HCT: 44.2 % (ref 39.0–52.0)
Hemoglobin: 14.9 g/dL (ref 13.0–17.0)
MCH: 29.9 pg (ref 26.0–34.0)
MCHC: 33.7 g/dL (ref 30.0–36.0)
MCV: 88.8 fL (ref 80.0–100.0)
Platelets: 195 10*3/uL (ref 150–400)
RBC: 4.98 MIL/uL (ref 4.22–5.81)
RDW: 12 % (ref 11.5–15.5)
WBC: 5.7 10*3/uL (ref 4.0–10.5)
nRBC: 0 % (ref 0.0–0.2)

## 2024-07-24 LAB — POCT ACTIVATED CLOTTING TIME: Activated Clotting Time: 235 s

## 2024-07-24 LAB — HEPARIN LEVEL (UNFRACTIONATED): Heparin Unfractionated: 0.49 [IU]/mL (ref 0.30–0.70)

## 2024-07-24 MED ORDER — HEPARIN SODIUM (PORCINE) 1000 UNIT/ML IJ SOLN
INTRAMUSCULAR | Status: AC
  Filled 2024-07-24: qty 10

## 2024-07-24 MED ORDER — SODIUM CHLORIDE 0.9 % IV SOLN: 250.0000 mL | INTRAVENOUS | Status: DC | PRN

## 2024-07-24 MED ORDER — MIDAZOLAM HCL (PF) 2 MG/2ML IJ SOLN
INTRAMUSCULAR | Status: DC | PRN
  Administered 2024-07-24 (×2): 1 mg via INTRAVENOUS

## 2024-07-24 MED ORDER — HEPARIN SODIUM (PORCINE) 5000 UNIT/ML IJ SOLN: 5000.0000 [IU] | Freq: Three times a day (TID) | INTRAMUSCULAR | Status: DC

## 2024-07-24 MED ORDER — VERAPAMIL HCL 2.5 MG/ML IV SOLN
INTRAVENOUS | Status: AC
  Filled 2024-07-24: qty 2

## 2024-07-24 MED ORDER — NITROGLYCERIN 1 MG/10 ML FOR IR/CATH LAB
INTRA_ARTERIAL | Status: AC
  Filled 2024-07-24: qty 10

## 2024-07-24 MED ORDER — MIDAZOLAM HCL 2 MG/2ML IJ SOLN
INTRAMUSCULAR | Status: AC
  Filled 2024-07-24: qty 2

## 2024-07-24 MED ORDER — FENTANYL CITRATE (PF) 100 MCG/2ML IJ SOLN
INTRAMUSCULAR | Status: DC | PRN
  Administered 2024-07-24 (×2): 25 ug via INTRAVENOUS

## 2024-07-24 MED ORDER — SODIUM CHLORIDE 0.9% FLUSH: 3.0000 mL | Freq: Two times a day (BID) | INTRAVENOUS | Status: DC

## 2024-07-24 MED ORDER — HEPARIN (PORCINE) IN NACL 1000-0.9 UT/500ML-% IV SOLN
INTRAVENOUS | Status: DC | PRN
  Administered 2024-07-24 (×2): 500 mL

## 2024-07-24 MED ORDER — HEPARIN SODIUM (PORCINE) 1000 UNIT/ML IJ SOLN
INTRAMUSCULAR | Status: DC | PRN
  Administered 2024-07-24: 7000 [IU] via INTRAVENOUS
  Administered 2024-07-24: 6000 [IU] via INTRAVENOUS

## 2024-07-24 MED ORDER — LIDOCAINE HCL (PF) 1 % IJ SOLN
INTRAMUSCULAR | Status: AC
  Filled 2024-07-24: qty 30

## 2024-07-24 MED ORDER — VERAPAMIL HCL 2.5 MG/ML IV SOLN
INTRAVENOUS | Status: DC | PRN
  Administered 2024-07-24: 10 mL via INTRA_ARTERIAL

## 2024-07-24 MED ORDER — HYDRALAZINE HCL 20 MG/ML IJ SOLN: 10.0000 mg | INTRAMUSCULAR | Status: DC | PRN

## 2024-07-24 MED ORDER — FENTANYL CITRATE (PF) 100 MCG/2ML IJ SOLN
INTRAMUSCULAR | Status: AC
  Filled 2024-07-24: qty 2

## 2024-07-24 MED ORDER — NITROGLYCERIN 1 MG/10 ML FOR IR/CATH LAB
INTRA_ARTERIAL | Status: DC | PRN
  Administered 2024-07-24: 200 ug via INTRACORONARY

## 2024-07-24 MED ORDER — IOHEXOL 350 MG/ML SOLN
INTRAVENOUS | Status: DC | PRN
  Administered 2024-07-24: 65 mL

## 2024-07-24 MED ORDER — FREE WATER: 250.0000 mL | Freq: Once | Status: DC

## 2024-07-24 MED ORDER — LABETALOL HCL 5 MG/ML IV SOLN: 10.0000 mg | INTRAVENOUS | Status: DC | PRN

## 2024-07-24 MED ORDER — ASPIRIN 81 MG PO CHEW: 81.0000 mg | CHEWABLE_TABLET | ORAL | Status: DC

## 2024-07-24 MED ORDER — LIDOCAINE HCL (PF) 1 % IJ SOLN
INTRAMUSCULAR | Status: DC | PRN
  Administered 2024-07-24 (×2): 5 mL via INTRADERMAL
  Administered 2024-07-24: 2 mL via INTRADERMAL

## 2024-07-24 MED ORDER — SODIUM CHLORIDE 0.9% FLUSH: 3.0000 mL | INTRAVENOUS | Status: DC | PRN

## 2024-07-24 NOTE — Progress Notes (Signed)
 PHARMACY - ANTICOAGULATION CONSULT NOTE  Pharmacy Consult for heparin  Indication: chest pain/ACS  Allergies[1]  Patient Measurements: Height: 6' 2 (188 cm) Weight: 126.6 kg (279 lb 1.6 oz) IBW/kg (Calculated) : 82.2 HEPARIN  DW (KG): 109.9  Vital Signs: Temp: 97.9 F (36.6 C) (02/04 0501) Temp Source: Oral (02/04 0501) BP: 112/66 (02/04 0501) Pulse Rate: 69 (02/04 0501)  Labs: Recent Labs    07/22/24 1443 07/23/24 1957 07/24/24 0429  HGB 16.7  --  14.9  HCT 48.1  --  44.2  PLT 247  --  195  HEPARINUNFRC  --  0.51 0.49  CREATININE 1.31*  --  1.41*    Estimated Creatinine Clearance: 81.8 mL/min (A) (by C-G formula based on SCr of 1.41 mg/dL (H)).   Medical History: Past Medical History:  Diagnosis Date   Asthma    Coronary artery disease    GERD (gastroesophageal reflux disease)    Hypertension    MI (myocardial infarction) (HCC)    Sleep apnea    Assessment: 59 year old male presents to [facility] with chest pain. Patient received low dose lovenox  last night, new orders to transition to heparin  in anticipation of cath tomorrow. Patient is not on blood thinners prior to admit, cbc within normal limits.   HL 0.51> 0.49, therapeutic. No issues with infusion per RN. Plan for cath today at [facility]  Goal of Therapy:  Heparin  level 0.3-0.7 units/ml Monitor platelets by anticoagulation protocol: Yes   Plan:  Continue heparin  infusion at 1500 units/hr Check anti-Xa level  daily while on heparin  Continue to monitor H&H and platelets  Cherlyn Boers, BS Pharm D, BCPS Clinical Pharmacist 07/24/2024 8:04 AM      [1]  Allergies Allergen Reactions   Phenergan [Promethazine]     Seizures and vomiting

## 2024-07-24 NOTE — Progress Notes (Addendum)
 "          PROGRESS NOTE  Chase Edwards FMW:[id number] DOB: June 19, 1966 DOA: 07/22/2024 PCP: Jolee Elsie RAMAN, PA  Brief History:  59 year old male with a history of coronary disease with history of CABG 2018, hypertension, PNES, OSA, hyperlipidemia presenting with exertional chest pressure.  He had associated dizziness, nausea, and diaphoresis when he was shoveling snow.  His symptoms improved at rest.  The patient also noted that he had had some chest discomfort climbing 1 flight of stairs at home.  Patient states that even during his periods of rest he never was completely pain-free.  He continued to have some low-level discomfort.  He had remittent radiation to his left arm.  He had similar symptoms when he had his CABG in 2018 [place] . 59 y.o. male with medical history significant for CABG, OSA, seizures, hypertension. He denies any fevers, chills, abdominal pain, diarrhea, hematochezia, melena. ED Course: Temperature 98.4.  Heart rate 60s to 98, respiratory rate 13-23.  Blood pressure systolic 97-119.  O2 sats 90 to 99% on room air.  Troponin < 6 x 2.  EKG without significant abnormalities.  Chest x-ray without acute abnormality, shows cardiomegaly, lingular scarring . Nitropaste given, but with drop in systolic blood pressure to 90s, this was removed. 500 mL bolus was given.   Cardiology was consulted.  The patient was started on IV heparin  for unstable angina.  He is being transferred to [facility] for left and right heart catheterization on 07/24/2024.   Assessment/Plan: Unstable angina - Appreciate cardiology consult - Plan for [facility] and [facility] 07/24/24 - Started on heparin  to 326 - Continue pravastatin  - Holding ARB in preparation for heart catheterization  Essential hypertension - Metoprolol  and losartan  held secondary to soft BPs  PNES - Patient follows with neurology, Dr. Georjean -underwent video EEG monitoring off medication with normal baseline EEG. Episode of aphasia and headache  captured did not show EEG correlate.  - No further episodes since 11/2022 -Migraines controlled on Zonisamide  100mg  at bedtime and prn sumatriptan .   CKD stage II - Baseline creatinine 1.1-1.3 - Monitor BMP  Mixed hyperlipidemia - Continue statin  Depression - Continue Pristiq   Class 2 obesity -lifestyle modification -BMI 35.83       Family Communication:   wife at bedside 2/4  Consultants:  cardiology  Code Status:  FULL   DVT Prophylaxis:  IV Heparin     Procedures: As Listed in Progress Note Above  Antibiotics: None      Subjective: Patient had low-level substernal chest discomfort overnight.  He denies any shortness of breath, nausea, dizziness.  He denies abdominal pain, vomiting, diarrhea, hematochezia, melena.  There is no dysuria or hematuria.  Objective: Vitals:   07/23/24 1716 07/23/24 1930 07/23/24 2015 07/24/24 0501  BP: 119/77  (!) 146/86 112/66  Pulse: 66  76 69  Resp:   20   Temp:   97.8 F (36.6 C) 97.9 F (36.6 C)  TempSrc:   Oral Oral  SpO2:  93% 96% 92%  Weight:      Height:        Intake/Output Summary (Last 24 hours) at 07/24/2024 0629 Last data filed at 07/24/2024 0343 Gross per 24 hour  Intake 982.89 ml  Output --  Net 982.89 ml   Weight change:  Exam:  General:  Pt is alert, follows commands appropriately, not in acute distress HEENT: No icterus, No thrush, No neck mass, [place]/AT Cardiovascular: RRR, S1/S2, no rubs, no gallops Respiratory: Bibasilar crackles.  No wheezing Abdomen: Soft/+BS, non tender, non distended, no guarding Extremities: No edema, No lymphangitis, No petechiae, No rashes, no synovitis   Data Reviewed: I have personally reviewed following labs and imaging studies Basic Metabolic Panel: Recent Labs  Lab 07/22/24 1443 07/24/24 0429  NA 141 143  K 4.3 3.8  CL 107 108  CO2 20* 24  GLUCOSE 91 93  BUN 14 19  CREATININE 1.31* 1.41*  CALCIUM  9.2 8.2*   Liver Function Tests: No results for  input(s): AST, ALT, ALKPHOS, BILITOT, PROT, ALBUMIN in the last 168 hours. No results for input(s): LIPASE, AMYLASE in the last 168 hours. No results for input(s): AMMONIA in the last 168 hours. Coagulation Profile: No results for input(s): INR, PROTIME in the last 168 hours. CBC: Recent Labs  Lab 07/22/24 1443 07/24/24 0429  WBC 8.5 5.7  HGB 16.7 14.9  HCT 48.1 44.2  MCV 88.3 88.8  PLT 247 195   Cardiac Enzymes: No results for input(s): CKTOTAL, CKMB, CKMBINDEX, TROPONINI in the last 168 hours. BNP: Invalid input(s): POCBNP CBG: No results for input(s): GLUCAP in the last 168 hours. HbA1C: No results for input(s): HGBA1C in the last 72 hours. Urine analysis:    Component Value Date/Time   COLORURINE YELLOW 11/28/2022 1900   APPEARANCEUR CLOUDY (A) 11/28/2022 1900   LABSPEC 1.023 11/28/2022 1900   PHURINE 6.0 11/28/2022 1900   GLUCOSEU NEGATIVE 11/28/2022 1900   HGBUR NEGATIVE 11/28/2022 1900   BILIRUBINUR NEGATIVE 11/28/2022 1900   KETONESUR NEGATIVE 11/28/2022 1900   PROTEINUR NEGATIVE 11/28/2022 1900   NITRITE NEGATIVE 11/28/2022 1900   LEUKOCYTESUR NEGATIVE 11/28/2022 1900   Sepsis Labs: @LABRCNTIP (procalcitonin:4,lacticidven:4) )No results found for this or any previous visit (from the past 240 hours).   Scheduled Meds:  aspirin   81 mg Oral Daily   budesonide -glycopyrrolate -formoterol   2 puff Inhalation BID   desvenlafaxine   50 mg Oral Daily   ezetimibe   10 mg Oral Daily   pantoprazole   40 mg Oral BID AC   pravastatin   80 mg Oral QHS   QUEtiapine   100 mg Oral QHS   zonisamide   100 mg Oral QHS   Continuous Infusions:  heparin  1,500 Units/hr (07/24/24 0341)    Procedures/Studies: ECHOCARDIOGRAM COMPLETE Result Date: 07/23/2024    ECHOCARDIOGRAM REPORT   Patient Name:   Chase Edwards Date of Exam: 07/23/2024 Medical Rec #:  [id number]  Height:       74.0 in Accession #:    [id number] Weight:       279.1 lb Date of Birth:   1965/10/15  BSA:          2.504 m Patient Age:    58 years   BP:           108/69 mmHg Patient Gender: M          HR:           71 bpm. Exam Location:  [facility] Procedure: 2D Echo, Cardiac Doppler, Color Doppler and Intracardiac            Opacification Agent (Both Spectral and Color Flow Doppler were            utilized during procedure). Indications:    Chest Pain R07.9  History:        Patient has no prior history of Echocardiogram examinations.                 CAD, Prior CABG; Risk Factors:Hypertension and Dyslipidemia.  Sonographer:    Aida Pizza  RCS Referring Phys: [id number] EJIROGHENE E EMOKPAE IMPRESSIONS  1. Left ventricular ejection fraction, by estimation, is 60 to 65%. The left ventricle has normal function. The left ventricle has no regional wall motion abnormalities. Left ventricular diastolic parameters were normal.  2. Right ventricular systolic function is mildly reduced. The right ventricular size is moderately enlarged. There is normal pulmonary artery systolic pressure.  3. The mitral valve is normal in structure. No evidence of mitral valve regurgitation. No evidence of mitral stenosis.  4. The aortic valve is tricuspid. Aortic valve regurgitation is not visualized. No aortic stenosis is present.  5. The inferior vena cava is dilated in size with >50% respiratory variability, suggesting right atrial pressure of 8 mmHg. Comparison(s): No prior Echocardiogram. FINDINGS  Left Ventricle: Left ventricular ejection fraction, by estimation, is 60 to 65%. The left ventricle has normal function. The left ventricle has no regional wall motion abnormalities. Definity  contrast agent was given IV to delineate the left ventricular  endocardial borders. Strain was performed and the global longitudinal strain is indeterminate. The left ventricular internal cavity size was normal in size. There is no left ventricular hypertrophy. Left ventricular diastolic parameters were normal. Right Ventricle: The right  ventricular size is moderately enlarged. No increase in right ventricular wall thickness. Right ventricular systolic function is mildly reduced. There is normal pulmonary artery systolic pressure. The tricuspid regurgitant velocity is 1.94 m/s, and with an assumed right atrial pressure of 8 mmHg, the estimated right ventricular systolic pressure is 23.1 mmHg. Left [facility]: Left atrial size was normal in size. Right [facility]: Right atrial size was normal in size. Pericardium: There is no evidence of pericardial effusion. Mitral Valve: The mitral valve is normal in structure. No evidence of mitral valve regurgitation. No evidence of mitral valve stenosis. Tricuspid Valve: The tricuspid valve is normal in structure. Tricuspid valve regurgitation is mild . No evidence of tricuspid stenosis. Aortic Valve: The aortic valve is tricuspid. Aortic valve regurgitation is not visualized. No aortic stenosis is present. Pulmonic Valve: The pulmonic valve was normal in structure. Pulmonic valve regurgitation is not visualized. No evidence of pulmonic stenosis. Aorta: The aortic root is normal in size and structure. Venous: The inferior vena cava is dilated in size with greater than 50% respiratory variability, suggesting right atrial pressure of 8 mmHg. IAS/Shunts: No atrial level shunt detected by color flow Doppler. Additional Comments: 3D was performed not requiring image post processing on an independent workstation and was indeterminate.  LEFT VENTRICLE PLAX 2D LVIDd:         4.70 cm   Diastology LVIDs:         3.20 cm   LV e' medial:    6.22 cm/s LV PW:         1.20 cm   LV E/e' medial:  10.4 LV IVS:        1.10 cm   LV e' lateral:   8.83 cm/s LVOT diam:     2.20 cm   LV E/e' lateral: 7.3 LV SV:         75 LV SV Index:   30 LVOT Area:     3.80 cm  RIGHT VENTRICLE RV S prime:     10.30 cm/s TAPSE (M-mode): 1.4 cm LEFT ATRIUM             Index        RIGHT ATRIUM           Index LA diam:  3.90 cm 1.56 cm/m   RA Area:      18.10 cm LA Vol (A2C):   56.6 ml 22.60 ml/m  RA Volume:   48.60 ml  19.41 ml/m LA Vol (A4C):   47.5 ml 18.97 ml/m LA Biplane Vol: 52.9 ml 21.12 ml/m  AORTIC VALVE LVOT Vmax:   93.60 cm/s LVOT Vmean:  65.200 cm/s LVOT VTI:    0.196 m  AORTA Ao Root diam: 3.50 cm MITRAL VALVE               TRICUSPID VALVE MV Area (PHT): 3.72 cm    TR Peak grad:   15.1 mmHg MV Decel Time: 204 msec    TR Vmax:        194.00 cm/s MV E velocity: 64.80 cm/s MV A velocity: 51.00 cm/s  SHUNTS MV E/A ratio:  1.27        Systemic VTI:  0.20 m                            Systemic Diam: 2.20 cm Vishnu Priya Mallipeddi Electronically signed by Diannah Late Mallipeddi Signature Date/Time: 07/23/2024/3:48:44 PM    Final    DG Chest 2 View Result Date: 07/22/2024 CLINICAL DATA:  Chest pain for 1 week. EXAM: CHEST - 2 VIEW COMPARISON:  Plain film of 10/25/2021. FINDINGS: Mild hyperinflation.  Median sternotomy. Midline trachea. Mild cardiomegaly. Similar pleuroparenchymal opacity throughout the inferior left hemithorax on the frontal view. Favored to represent pleuroparenchymal scarring, primarily within the lingula, when correlated with the lateral view. No superimposed pleural fluid, congestive failure. No pneumothorax. The right lung is clear. IMPRESSION: 1. No acute cardiopulmonary disease. 2. Cardiomegaly without congestive failure. 3. Pleuroparenchymal scarring within the lingula. Electronically Signed   By: Rockey Kilts M.D.   On: 07/22/2024 14:40    Alm Schneider, DO  [facility]  If 7PM-7AM, please contact night-coverage [email or web address] Password [email or web address] 07/24/2024, 6:29 AM   LOS: 0 days   "

## 2024-07-24 NOTE — Discharge Summary (Signed)
 " Discharge Summary   Patient ID: Chase Edwards MRN: [id number]; DOB: 12/03/1965  Admit date: 07/22/2024 Discharge date: 07/24/2024  PCP:  Jolee Elsie RAMAN, PA   [facility] Providers Cardiologist:  Alvan Carrier, MD     Discharge Diagnoses  Principal Problem:   Chest pain Active Problems:   Essential (primary) hypertension   Hx of CABG   Obstructive sleep apnea (adult) (pediatric)   Class 2 obesity  Diagnostic Studies/Procedures   Coronary and bypass graft angiography 07/24/2024: LM: Normal LAD: Ostial 50% disease, RFR 1.0          Mid LAD stent occlusion, bypassed by patent LIMA-LAD Lcx: No significant disease RCA: Dominant, mild diffuse disease LIMA-LAD: Patent, attaches distal to occluded mid LAD stent, no flow back proximally   LVEDP 29 mmHg   Right heart catheterization 07/24/2024: RA: 13 mmHg RV: 32/4 mmHg PA: 38/21 mmHg, mPAP 29 mmHg PCW: 23 mmHg PAPi 1.3   AO sats: 88% PA sats: 61%   CO: 6.0 L/min CI: 2.4 L/min/m2        Conclusion: Severe single vessel disease (mid LAD occlusion) Patent LIMA-LAD graft Mild pulmonary hypertension, WHO grp II Mildly elevated filling pressures   Recommendation: Consider noncardiac cause for chest pain Consider diuresis   Newman [name], MD   _____________   History of Present Illness   Chase Edwards is a 59 y.o. 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  He underwent single-vessel CABG in [place]  in 2018.  Underwent cardiac catheterization in 2022 with CTO of mid LAD stent but widely patent LIMA.  Echocardiogram in 2024 at [facility] with LVEF of 55%, mild MR.  Nuclear stress test in 2025 with possible artifact but no ischemia.  Last seen by Dr. Alvan 03/2024 and was doing well from a cardiac standpoint.  Presented to [facility] the ED with complaints of chest pain with  high-sensitivity troponin negative x 2 but concerning symptoms.  EKG without ischemic changes.  Patient was seen by Dr. Mallipeddi with recommendations to transfer to [facility] for further evaluation given concerns for unstable angina.  Hospital Course    Chest pain CAD status post single-vessel CABG (LIMA-LAD) '18 -- High-sensitivity troponin negative x 2, EKG without ischemic changes. -- Underwent cardiac catheterization 2/4 with patent LIMA to LAD, ostial LAD disease of 50%, with RFR of 1.0.  No clear etiology for symptoms suspected noncardiac -- Reports he has tried Imdur  in the past and unable to tolerate secondary to headaches.  Did discuss possibility of adding Ranexa as an outpatient.  Will defer to patient's PCP cardiologist.  Hypertension -- Initially with soft blood pressures on admission and his home medications were held.  Now elevated -- Will continue home metoprolol  and losartan  at discharge  Hyperlipidemia -- Continue statin  CKD stage II -- baseline Cr around 1.1-1.3, Cr 1.4 at discharge -- follow up BMET outpatient   Seizures -- Follows with neurology as an outpatient  Did the patient have an acute coronary syndrome (MI, NSTEMI, STEMI, etc) this admission?:  No                               Did the patient have a percutaneous coronary intervention (stent / angioplasty)?:  No.          _____________  Discharge  Vitals Blood pressure (!) 153/95, pulse 76, temperature 97.9 F (36.6 C), temperature source Oral, resp. rate 13, height 6' 2 (1.88 m), weight 126.6 kg, SpO2 91%.  Filed Weights   07/22/24 1412 07/22/24 1818  Weight: 124.7 kg 126.6 kg    Labs & Radiologic Studies  CBC Recent Labs    07/22/24 1443 07/24/24 0429 07/24/24 1117 07/24/24 1118  WBC 8.5 5.7  --   --   HGB 16.7 14.9 15.0 15.0  HCT 48.1 44.2 44.0 44.0  MCV 88.3 88.8  --   --   PLT 247 195  --   --    Basic Metabolic Panel Recent Labs    97/97/73 1443 07/24/24 0429  07/24/24 1117 07/24/24 1118  NA 141 143 141 142  K 4.3 3.8 4.0 4.0  CL 107 108  --   --   CO2 20* 24  --   --   GLUCOSE 91 93  --   --   BUN 14 19  --   --   CREATININE 1.31* 1.41*  --   --   CALCIUM  9.2 8.2*  --   --    Liver Function Tests No results for input(s): AST, ALT, ALKPHOS, BILITOT, PROT, ALBUMIN in the last 72 hours. No results for input(s): LIPASE, AMYLASE in the last 72 hours. High Sensitivity Troponin:   No results for input(s): TROPONINIHS in the last 720 hours.  Recent Labs  Lab 07/22/24 1443 07/22/24 1610 07/23/24 0429 07/23/24 1403  TRNPT 6 <6 12 <6    BNP Invalid input(s): POCBNP Recent Labs    07/22/24 1458  PROBNP 55.9    No results for input(s): BNP in the last 72 hours.  D-Dimer No results for input(s): DDIMER in the last 72 hours. Hemoglobin A1C No results for input(s): HGBA1C in the last 72 hours. Fasting Lipid Panel No results for input(s): CHOL, HDL, LDLCALC, TRIG, CHOLHDL, LDLDIRECT in the last 72 hours. No results found for: LIPOA  Thyroid  Function Tests No results for input(s): TSH, T4TOTAL, T3FREE, THYROIDAB in the last 72 hours.  Invalid input(s): FREET3 _____________  CARDIAC CATHETERIZATION Result Date: 07/24/2024 Images from the original result were not included. Coronary and bypass graft angiography 07/24/2024: LM: Normal LAD: Ostial 50% disease, RFR 1.0          Mid LAD stent occlusion, bypassed by patent LIMA-LAD Lcx: No significant disease RCA: Dominant, mild diffuse disease LIMA-LAD: Patent, attaches distal to occluded mid LAD stent, no flow back proximally LVEDP 29 mmHg Right heart catheterization 07/24/2024: RA: 13 mmHg RV: 32/4 mmHg PA: 38/21 mmHg, mPAP 29 mmHg PCW: 23 mmHg PAPi 1.3 AO sats: 88% PA sats: 61% CO: 6.0 L/min CI: 2.4 L/min/m2 Conclusion: Severe single vessel disease (mid LAD occlusion) Patent LIMA-LAD graft Mild pulmonary hypertension, WHO grp II Mildly elevated filling  pressures Recommendation: Consider noncardiac cause for chest pain Consider diuresis Newman [name], MD   ECHOCARDIOGRAM COMPLETE Result Date: 07/23/2024    ECHOCARDIOGRAM REPORT   Patient Name:   Chase Edwards Date of Exam: 07/23/2024 Medical Rec #:  [id number]  Height:       74.0 in Accession #:    [id number] Weight:       279.1 lb Date of Birth:  Jun 19, 1966  BSA:          2.504 m Patient Age:    58 years   BP:           108/69 mmHg Patient Gender: M  HR:           71 bpm. Exam Location:  [facility] Procedure: 2D Echo, Cardiac Doppler, Color Doppler and Intracardiac            Opacification Agent (Both Spectral and Color Flow Doppler were            utilized during procedure). Indications:    Chest Pain R07.9  History:        Patient has no prior history of Echocardiogram examinations.                 CAD, Prior CABG; Risk Factors:Hypertension and Dyslipidemia.  Sonographer:    Aida Pizza RCS Referring Phys: [phone] EJIROGHENE E EMOKPAE IMPRESSIONS  1. Left ventricular ejection fraction, by estimation, is 60 to 65%. The left ventricle has normal function. The left ventricle has no regional wall motion abnormalities. Left ventricular diastolic parameters were normal.  2. Right ventricular systolic function is mildly reduced. The right ventricular size is moderately enlarged. There is normal pulmonary artery systolic pressure.  3. The mitral valve is normal in structure. No evidence of mitral valve regurgitation. No evidence of mitral stenosis.  4. The aortic valve is tricuspid. Aortic valve regurgitation is not visualized. No aortic stenosis is present.  5. The inferior vena cava is dilated in size with >50% respiratory variability, suggesting right atrial pressure of 8 mmHg. Comparison(s): No prior Echocardiogram. FINDINGS  Left Ventricle: Left ventricular ejection fraction, by estimation, is 60 to 65%. The left ventricle has normal function. The left ventricle has no regional wall motion abnormalities.  Definity  contrast agent was given IV to delineate the left ventricular  endocardial borders. Strain was performed and the global longitudinal strain is indeterminate. The left ventricular internal cavity size was normal in size. There is no left ventricular hypertrophy. Left ventricular diastolic parameters were normal. Right Ventricle: The right ventricular size is moderately enlarged. No increase in right ventricular wall thickness. Right ventricular systolic function is mildly reduced. There is normal pulmonary artery systolic pressure. The tricuspid regurgitant velocity is 1.94 m/s, and with an assumed right atrial pressure of 8 mmHg, the estimated right ventricular systolic pressure is 23.1 mmHg. Left Atrium: Left atrial size was normal in size. Right Atrium: Right atrial size was normal in size. Pericardium: There is no evidence of pericardial effusion. Mitral Valve: The mitral valve is normal in structure. No evidence of mitral valve regurgitation. No evidence of mitral valve stenosis. Tricuspid Valve: The tricuspid valve is normal in structure. Tricuspid valve regurgitation is mild . No evidence of tricuspid stenosis. Aortic Valve: The aortic valve is tricuspid. Aortic valve regurgitation is not visualized. No aortic stenosis is present. Pulmonic Valve: The pulmonic valve was normal in structure. Pulmonic valve regurgitation is not visualized. No evidence of pulmonic stenosis. Aorta: The aortic root is normal in size and structure. Venous: The inferior vena cava is dilated in size with greater than 50% respiratory variability, suggesting right atrial pressure of 8 mmHg. IAS/Shunts: No atrial level shunt detected by color flow Doppler. Additional Comments: 3D was performed not requiring image post processing on an independent workstation and was indeterminate.  LEFT VENTRICLE PLAX 2D LVIDd:         4.70 cm   Diastology LVIDs:         3.20 cm   LV e' medial:    6.22 cm/s LV PW:         1.20 cm   LV E/e'  medial:  10.4 LV IVS:  1.10 cm   LV e' lateral:   8.83 cm/s LVOT diam:     2.20 cm   LV E/e' lateral: 7.3 LV SV:         75 LV SV Index:   30 LVOT Area:     3.80 cm  RIGHT VENTRICLE RV S prime:     10.30 cm/s TAPSE (M-mode): 1.4 cm LEFT ATRIUM             Index        RIGHT ATRIUM           Index LA diam:        3.90 cm 1.56 cm/m   RA Area:     18.10 cm LA Vol (A2C):   56.6 ml 22.60 ml/m  RA Volume:   48.60 ml  19.41 ml/m LA Vol (A4C):   47.5 ml 18.97 ml/m LA Biplane Vol: 52.9 ml 21.12 ml/m  AORTIC VALVE LVOT Vmax:   93.60 cm/s LVOT Vmean:  65.200 cm/s LVOT VTI:    0.196 m  AORTA Ao Root diam: 3.50 cm MITRAL VALVE               TRICUSPID VALVE MV Area (PHT): 3.72 cm    TR Peak grad:   15.1 mmHg MV Decel Time: 204 msec    TR Vmax:        194.00 cm/s MV E velocity: 64.80 cm/s MV A velocity: 51.00 cm/s  SHUNTS MV E/A ratio:  1.27        Systemic VTI:  0.20 m                            Systemic Diam: 2.20 cm Vishnu Priya Mallipeddi Electronically signed by Diannah Late Mallipeddi Signature Date/Time: 07/23/2024/3:48:44 PM    Final    DG Chest 2 View Result Date: 07/22/2024 CLINICAL DATA:  Chest pain for 1 week. EXAM: CHEST - 2 VIEW COMPARISON:  Plain film of 10/25/2021. FINDINGS: Mild hyperinflation.  Median sternotomy. Midline trachea. Mild cardiomegaly. Similar pleuroparenchymal opacity throughout the inferior left hemithorax on the frontal view. Favored to represent pleuroparenchymal scarring, primarily within the lingula, when correlated with the lateral view. No superimposed pleural fluid, congestive failure. No pneumothorax. The right lung is clear. IMPRESSION: 1. No acute cardiopulmonary disease. 2. Cardiomegaly without congestive failure. 3. Pleuroparenchymal scarring within the lingula. Electronically Signed   By: Rockey Kilts M.D.   On: 07/22/2024 14:40    Disposition Pt is being discharged home today in good condition.  Follow-up Plans & Appointments  Discharge Instructions     Call MD  for:  difficulty breathing, headache or visual disturbances   Complete by: As directed    Call MD for:  redness, tenderness, or signs of infection (pain, swelling, redness, odor or green/yellow discharge around incision site)   Complete by: As directed    Discharge instructions   Complete by: As directed    Radial Site Care Refer to this sheet in the next few weeks. These instructions provide you with information on caring for yourself after your procedure. Your caregiver may also give you more specific instructions. Your treatment has been planned according to current medical practices, but problems sometimes occur. Call your caregiver if you have any problems or questions after your procedure. HOME CARE INSTRUCTIONS You may shower the day after the procedure. Remove the bandage (dressing) and gently wash the site with plain soap and water . Gently pat the  site dry.  Do not apply powder or lotion to the site.  Do not submerge the affected site in water  for 3 to 5 days.  Inspect the site at least twice daily.  Do not flex or bend the affected arm for 24 hours.  No lifting over 5 pounds (2.3 kg) for 5 days after your procedure.  Do not drive home if you are discharged the same day of the procedure. Have someone else drive you.  You may drive 24 hours after the procedure unless otherwise instructed by your caregiver.  What to expect: Any bruising will usually fade within 1 to 2 weeks.  Blood that collects in the tissue (hematoma) may be painful to the touch. It should usually decrease in size and tenderness within 1 to 2 weeks.  SEEK IMMEDIATE MEDICAL CARE IF: You have unusual pain at the radial site.  You have redness, warmth, swelling, or pain at the radial site.  You have drainage (other than a small amount of blood on the dressing).  You have chills.  You have a fever or persistent symptoms for more than 72 hours.  You have a fever and your symptoms suddenly get worse.  Your arm becomes  pale, cool, tingly, or numb.  You have heavy bleeding from the site. Hold pressure on the site.   Increase activity slowly   Complete by: As directed        Discharge Medications Allergies as of 07/24/2024       Reactions   Phenergan [promethazine]    Seizures and vomiting        Medication List     TAKE these medications    albuterol  108 (90 Base) MCG/ACT inhaler Commonly known as: VENTOLIN  HFA Inhale 1 puff into the lungs every 6 (six) hours as needed for shortness of breath.   APAP-Pamabrom-Pyrilamine 500-25-15 MG Tabs Take 3 tablets by mouth at bedtime.   aspirin  81 MG chewable tablet Chew 1 tablet (81 mg total) by mouth daily.   Breztri  Aerosphere 160-9-4.8 MCG/ACT Aero inhaler Generic drug: budesonide -glycopyrrolate -formoterol  Inhale 2 puffs into the lungs in the morning and at bedtime.   desvenlafaxine  50 MG 24 hr tablet Commonly known as: PRISTIQ  Take 50 mg by mouth daily.   ezetimibe  10 MG tablet Commonly known as: ZETIA  Take 1 tablet (10 mg total) by mouth daily.   ibuprofen 600 MG tablet Commonly known as: ADVIL Take 600 mg by mouth every 6 (six) hours as needed.   ipratropium-albuterol  0.5-2.5 (3) MG/3ML Soln Commonly known as: DUONEB Inhale 3 mLs into the lungs every 12 (twelve) hours as needed (shortness of breath).   loratadine 10 MG tablet Commonly known as: CLARITIN Take 20 mg by mouth daily.   losartan  50 MG tablet Commonly known as: COZAAR  Take 1 tablet (50 mg total) by mouth daily.   metoprolol  succinate 50 MG 24 hr tablet Commonly known as: TOPROL -XL Take 1 tablet (50 mg total) by mouth daily. What changed: Another medication with the same name was removed. Continue taking this medication, and follow the directions you see here.   nitroGLYCERIN  0.4 MG SL tablet Commonly known as: NITROSTAT  Place 0.4 mg under the tongue every 5 (five) minutes as needed for chest pain.   pantoprazole  40 MG tablet Commonly known as: PROTONIX  Take  1 tablet (40 mg total) by mouth 2 (two) times daily before a meal.   pravastatin  80 MG tablet Commonly known as: PRAVACHOL  Take 1 tablet (80 mg total) by mouth daily.  QUEtiapine  200 MG tablet Commonly known as: SEROQUEL  Take 200 mg by mouth at bedtime.   SUMAtriptan  25 MG tablet Commonly known as: IMITREX  Take 1 tablet (25 mg total) by mouth 2 (two) times daily as needed for migraine or headache. May repeat in 2 hours if headache persists or recurs.   zonisamide  100 MG capsule Commonly known as: ZONEGRAN  TAKE 1 CAPSULE BY MOUTH EVERY NIGHT         Outstanding Labs/Studies  BMET at follow up  Duration of Discharge Encounter: APP Time: 15 minutes   Signed, Manuelita Rummer, NP 07/24/2024, 2:20 PM     "

## 2024-07-24 NOTE — Discharge Instructions (Signed)
Drink plenty of fluids for 48 hours and keep wrist elevated at heart level for 24 hours  Radial Site Care   This sheet gives you information about how to care for yourself after your procedure. Your health care provider may also give you more specific instructions. If you have problems or questions, contact your health care provider. What can I expect after the procedure? After the procedure, it is common to have: Bruising and tenderness at the catheter insertion area. Follow these instructions at home: Medicines Take over-the-counter and prescription medicines only as told by your health care provider. Insertion site care Follow instructions from your health care provider about how to take care of your insertion site. Make sure you: Wash your hands with soap and water before you change your bandage (dressing). If soap and water are not available, use hand sanitizer. Remove your dressing as told by your health care provider. In 24 hours Check your insertion site every day for signs of infection. Check for: Redness, swelling, or pain. Fluid or blood. Pus or a bad smell. Warmth. Do not take baths, swim, or use a hot tub until your health care provider approves. You may shower 24-48 hours after the procedure, or as directed by your health care provider. Remove the dressing and gently wash the site with plain soap and water. Pat the area dry with a clean towel. Do not rub the site. That could cause bleeding. Do not apply powder or lotion to the site. Activity   For 24 hours after the procedure, or as directed by your health care provider: Do not flex or bend the affected arm. Do not push or pull heavy objects with the affected arm. Do not drive yourself home from the hospital or clinic. You may drive 24 hours after the procedure unless your health care provider tells you not to. Do not operate machinery or power tools. Do not lift anything that is heavier than 10 lb (4.5 kg), or the  limit that you are told, until your health care provider says that it is safe.  For 4 days Ask your health care provider when it is okay to: Return to work or school. Resume usual physical activities or sports. Resume sexual activity. General instructions If the catheter site starts to bleed, raise your arm and put firm pressure on the site. If the bleeding does not stop, get help right away. This is a medical emergency. If you went home on the same day as your procedure, a responsible adult should be with you for the first 24 hours after you arrive home. Keep all follow-up visits as told by your health care provider. This is important. Contact a health care provider if: You have a fever. You have redness, swelling, or yellow drainage around your insertion site. Get help right away if: You have unusual pain at the radial site. The catheter insertion area swells very fast. The insertion area is bleeding, and the bleeding does not stop when you hold steady pressure on the area. Your arm or hand becomes pale, cool, tingly, or numb. These symptoms may represent a serious problem that is an emergency. Do not wait to see if the symptoms will go away. Get medical help right away. Call your local emergency services (911 in the [place] Do not drive yourself to the hospital. Summary After the procedure, it is common to have bruising and tenderness at the site. Follow instructions from your health care provider about how to take care of your  radial site wound. Check the wound every day for signs of infection. Do not lift anything that is heavier than 10 lb (4.5 kg), or the limit that you are told, until your health care provider says that it is safe. This information is not intended to replace advice given to you by your health care provider. Make sure you discuss any questions you have with your health care provider. Document Revised: 07/12/2017 Document Reviewed: 07/12/2017 Elsevier Patient Education   2020 [employer] Femoral Site Care This sheet gives you information about how to care for yourself after your procedure. Your health care provider may also give you more specific instructions. If you have problems or questions, contact your health care provider. What can I expect after the procedure?  After the procedure, it is common to have: Bruising that usually fades within 1-2 weeks. Tenderness at the site. Follow these instructions at home: Wound care Follow instructions from your health care provider about how to take care of your insertion site. Make sure you: Wash your hands with soap and water before you change your bandage (dressing). If soap and water are not available, use hand sanitizer. Remove your dressing as told by your health care provider. In 24 hours Do not take baths, swim, or use a hot tub until your health care provider approves. You may shower 24-48 hours after the procedure or as told by your health care provider. Gently wash the site with plain soap and water. Pat the area dry with a clean towel. Do not rub the site. This may cause bleeding. Do not apply powder or lotion to the site. Keep the site clean and dry. Check your femoral site every day for signs of infection. Check for: Redness, swelling, or pain. Fluid or blood. Warmth. Pus or a bad smell. Activity For the first 2-3 days after your procedure, or as long as directed: Avoid climbing stairs as much as possible. Do not squat. Do not lift anything that is heavier than 10 lb (4.5 kg), or the limit that you are told, until your health care provider says that it is safe. For 5 days Rest as directed. Avoid sitting for a long time without moving. Get up to take short walks every 1-2 hours. Do not drive for 24 hours if you were given a medicine to help you relax (sedative). General instructions Take over-the-counter and prescription medicines only as told by your health care provider. Keep all follow-up  visits as told by your health care provider. This is important. Contact a health care provider if you have: A fever or chills. You have redness, swelling, or pain around your insertion site. Get help right away if: The catheter insertion area swells very fast. You pass out. You suddenly start to sweat or your skin gets clammy. The catheter insertion area is bleeding, and the bleeding does not stop when you hold steady pressure on the area. The area near or just beyond the catheter insertion site becomes pale, cool, tingly, or numb. These symptoms may represent a serious problem that is an emergency. Do not wait to see if the symptoms will go away. Get medical help right away. Call your local emergency services (911 in the [place] Do not drive yourself to the hospital. Summary After the procedure, it is common to have bruising that usually fades within 1-2 weeks. Check your femoral site every day for signs of infection. Do not lift anything that is heavier than 10 lb (4.5 kg), or the limit  that you are told, until your health care provider says that it is safe. This information is not intended to replace advice given to you by your health care provider. Make sure you discuss any questions you have with your health care provider. Document Revised: 06/19/2017 Document Reviewed: 06/19/2017 Elsevier Patient Education  2020 [employer].

## 2024-07-24 NOTE — Progress Notes (Signed)
 TR band removed at 1415, gauze dressing applied. Left radial level 0, clean, dry, and intact.

## 2024-07-24 NOTE — Interval H&P Note (Signed)
 History and Physical Interval Note:  07/24/2024 10:40 AM  Chase Edwards  has presented today for surgery, with the diagnosis of chest pain - angina.  The various methods of treatment have been discussed with the patient and family. After consideration of risks, benefits and other options for treatment, the patient has consented to  Procedures: RIGHT/LEFT HEART CATH AND CORONARY/GRAFT ANGIOGRAPHY (N/A) as a surgical intervention.  The patient's history has been reviewed, patient examined, no change in status, stable for surgery.  I have reviewed the patient's chart and labs.  Questions were answered to the patient's satisfaction.     Latanya Hemmer J Hope Brandenburger

## 2024-07-25 ENCOUNTER — Encounter (HOSPITAL_COMMUNITY): Payer: Self-pay | Admitting: Cardiology

## 2024-07-26 ENCOUNTER — Encounter: Payer: Self-pay | Admitting: Cardiology

## 2024-07-26 ENCOUNTER — Ambulatory Visit: Admitting: Cardiology

## 2024-07-26 VITALS — BP 128/78 | HR 88 | Ht 74.0 in | Wt 276.0 lb

## 2024-07-26 DIAGNOSIS — R0602 Shortness of breath: Secondary | ICD-10-CM

## 2024-07-26 DIAGNOSIS — I10 Essential (primary) hypertension: Secondary | ICD-10-CM

## 2024-07-26 DIAGNOSIS — G4733 Obstructive sleep apnea (adult) (pediatric): Secondary | ICD-10-CM

## 2024-07-26 DIAGNOSIS — E782 Mixed hyperlipidemia: Secondary | ICD-10-CM

## 2024-07-26 DIAGNOSIS — I2510 Atherosclerotic heart disease of native coronary artery without angina pectoris: Secondary | ICD-10-CM

## 2024-07-26 DIAGNOSIS — Z79899 Other long term (current) drug therapy: Secondary | ICD-10-CM

## 2024-07-26 MED ORDER — FUROSEMIDE 20 MG PO TABS: 20.0000 mg | ORAL_TABLET | Freq: Every day | ORAL | 3 refills | Status: AC

## 2024-07-26 NOTE — Patient Instructions (Addendum)
 Medication Instructions:   Begin Furosemide  20mg  daily  Continue all other medications.     Labwork:  BMET, Mg, BNP, FLP - orders given today  Reminder:  Nothing to eat or drink after 12 midnight prior to labs.  Office will contact with results via phone, letter or mychart.     Testing/Procedures:  none  Follow-Up:  4 weeks   Any Other Special Instructions Will Be Listed Below (If Applicable).  You have been referred to:  Pulmonary   If you need a refill on your cardiac medications before your next appointment, please call your pharmacy.

## 2024-07-26 NOTE — Progress Notes (Unsigned)
 "     Clinical Summary Chase Edwards is a 59 y.o.male seen today for follow up of the following medical problems.   1.CAD - history of CABG in 2018 (1-vessel LIMA-LAD). He reports this was done in [place] in setting of MI - cath 2022 no new significant disease per [facility] clinic note, cannot find cath report   - 10/2022 echo [facility]: LEVEF >55%, mild MR - atypical chest pain 08/2023 [facility] visit, was to have nuclear stress - 08/2023 nuclear stress: artifact vs scar apical, no ischemia.  - no chest pains.   -admit 07/2024 with chest pain - trops negative. EKG SR, lateral and anterior Qwaves, no acute ischemic changes  - 07/2024 echo: LVEF 60-65%,no WMAs, mild RV dysfunction - 07/2024 cath: LAD occlusion with patient LIMA-LAD, other vessels patent. LVEDP 29. Mean PA 29, PCWP 23, CI 2.4  - some chest pains at times, pressing like feeling. Constant for hours at time, at times varies in severity. SOB/DOE.  - not positional - no edema. Weight 268-->276      2. HTN - cough on lisnopril, now on losartan  - compliant with meds   3. HLD -on atorvastatin  reports muscle aches, then changed pravastatin  and has tolerated - needs repeat lipid panel   4.OSA - does not have cpap machine, needs to establish with pulmonary Past Medical History:  Diagnosis Date   Asthma    Coronary artery disease    GERD (gastroesophageal reflux disease)    Hypertension    MI (myocardial infarction) (HCC)    Sleep apnea      Allergies[1]   Current Outpatient Medications  Medication Sig Dispense Refill   albuterol  (VENTOLIN  HFA) 108 (90 Base) MCG/ACT inhaler Inhale 1 puff into the lungs every 6 (six) hours as needed for shortness of breath.     APAP-Pamabrom-Pyrilamine 500-25-15 MG TABS Take 3 tablets by mouth at bedtime. (Patient not taking: Reported on 07/23/2024)     aspirin  81 MG chewable tablet Chew 1 tablet (81 mg total) by mouth daily. 90 tablet 1   BREZTRI  AEROSPHERE 160-9-4.8 MCG/ACT AERO Inhale 2 puffs  into the lungs in the morning and at bedtime.     desvenlafaxine  (PRISTIQ ) 50 MG 24 hr tablet Take 50 mg by mouth daily.     ezetimibe  (ZETIA ) 10 MG tablet Take 1 tablet (10 mg total) by mouth daily. 90 tablet 1   ibuprofen (ADVIL) 600 MG tablet Take 600 mg by mouth every 6 (six) hours as needed. (Patient not taking: Reported on 07/23/2024)     ipratropium-albuterol  (DUONEB) 0.5-2.5 (3) MG/3ML SOLN Inhale 3 mLs into the lungs every 12 (twelve) hours as needed (shortness of breath).     loratadine (CLARITIN) 10 MG tablet Take 20 mg by mouth daily.     losartan  (COZAAR ) 50 MG tablet Take 1 tablet (50 mg total) by mouth daily. 90 tablet 1   metoprolol  succinate (TOPROL -XL) 50 MG 24 hr tablet Take 1 tablet (50 mg total) by mouth daily. 90 tablet 1   nitroGLYCERIN  (NITROSTAT ) 0.4 MG SL tablet Place 0.4 mg under the tongue every 5 (five) minutes as needed for chest pain.     pantoprazole  (PROTONIX ) 40 MG tablet Take 1 tablet (40 mg total) by mouth 2 (two) times daily before a meal. 180 tablet 3   pravastatin  (PRAVACHOL ) 80 MG tablet Take 1 tablet (80 mg total) by mouth daily. 90 tablet 1   QUEtiapine  (SEROQUEL ) 200 MG tablet Take 200 mg by mouth at bedtime.  SUMAtriptan  (IMITREX ) 25 MG tablet Take 1 tablet (25 mg total) by mouth 2 (two) times daily as needed for migraine or headache. May repeat in 2 hours if headache persists or recurs. 10 tablet 11   zonisamide  (ZONEGRAN ) 100 MG capsule TAKE 1 CAPSULE BY MOUTH EVERY NIGHT 90 capsule 4   No current facility-administered medications for this visit.     Past Surgical History:  Procedure Laterality Date   APPENDECTOMY     BIOPSY  10/21/2022   Procedure: BIOPSY;  Surgeon: Eartha Angelia Sieving, MD;  Location: [facility];  Service: Gastroenterology;;   CORONARY ARTERY BYPASS GRAFT     CORONARY PRESSURE/FFR STUDY N/A 07/24/2024   Procedure: CORONARY PRESSURE/FFR STUDY;  Surgeon: Elmira Newman PARAS, MD;  Location: [facility];  Service:  Cardiovascular;  Laterality: N/A;   CORONARY STENT INTERVENTION     ESOPHAGOGASTRODUODENOSCOPY (EGD) WITH PROPOFOL  N/A 10/21/2022   Procedure: ESOPHAGOGASTRODUODENOSCOPY (EGD) WITH PROPOFOL ;  Surgeon: Eartha Angelia Sieving, MD;  Location: [facility];  Service: Gastroenterology;  Laterality: N/A;  7:30 AM   LEFT HEART CATH AND CORS/GRAFTS ANGIOGRAPHY N/A 12/24/2020   Procedure: LEFT HEART CATH AND CORS/GRAFTS ANGIOGRAPHY;  Surgeon: Mady Bruckner, MD;  Location: [facility];  Service: Cardiovascular;  Laterality: N/A;   RIGHT/LEFT HEART CATH AND CORONARY/GRAFT ANGIOGRAPHY N/A 07/24/2024   Procedure: RIGHT/LEFT HEART CATH AND CORONARY/GRAFT ANGIOGRAPHY;  Surgeon: Elmira Newman PARAS, MD;  Location: [facility];  Service: Cardiovascular;  Laterality: N/A;     Allergies[2]    Family History  Problem Relation Age of Onset   Colon cancer Neg Hx      Social History Chase Edwards reports that he has never smoked. He has never been exposed to tobacco smoke. He has never used smokeless tobacco. Chase Edwards reports that he does not currently use alcohol.   Physical Examination Today's Vitals   07/26/24 1603 07/26/24 1652  BP: (!) 148/92 128/78  Pulse: 88   SpO2: 96%   Weight: 276 lb (125.2 kg)   Height: 6' 2 (1.88 m)    Body mass index is 35.44 kg/m.  Gen: resting comfortably, no acute distress HEENT: no scleral icterus, pupils equal round and reactive, no palptable cervical adenopathy,  CV: RRR, no m/rg no jvd Resp: Clear to auscultation bilaterally GI: abdomen is soft, non-tender, non-distended, normal bowel sounds, no hepatosplenomegaly MSK: extremities are warm, no edema.  Skin: warm, no rash Neuro:  no focal deficits Psych: appropriate affect   Diagnostic Studies TTE 11/08/2022:  Summary 1. The left ventricle is normal in size with normal wall thickness. 2. The left ventricular systolic function is normal, LVEF is visually estimated at > 55%. 3. There is  mild mitral valve regurgitation. 4. The left atrium is mildly dilated in size. 5. The right ventricle is normal in size, with normal systolic function. 6. There is no evidence of an interatrial flow communication or intrapulmonary shunt by agitated saline study.      08/2023 nuclear stress [facility] - There is a small, moderately severe, fixed perfusion defect involving  the apical and apical inferior segments. This is consistent with probable  artifact but cannot rule out scar.  - Left ventricular systolic function is normal. Post stress the ejection  fraction is > 60%.     Assessment and Plan  1.CAD - recent admit with chest pain, cath showed patent graft and coroanaries, elevated filling pressure - prior chest pains over the years since his CABG as reported above  with negative caths, stress testing - chest pain atypical, constant lasting all day long but worst with exertion with some DOE. Perhaps related to his increased filling pressures, exam limited for volume status due to body habitus - start lasix  20mg  daily, check bmet/mg/bnp in 2 weeks. May titrate lasix  pending response.    2. HTN -at goal, continue current meds   3. HLD - repeat lipid panel   F/u 4 weeks     Dorn PHEBE Ross, M.D.     [1]  Allergies Allergen Reactions   Phenergan [Promethazine]     Seizures and vomiting  [2]  Allergies Allergen Reactions   Phenergan [Promethazine]     Seizures and vomiting   "

## 2024-09-03 ENCOUNTER — Ambulatory Visit: Admitting: Cardiology

## 2024-10-14 ENCOUNTER — Ambulatory Visit: Admitting: Cardiology

## 2025-01-14 ENCOUNTER — Ambulatory Visit: Admitting: Neurology
